# Patient Record
Sex: Male | Born: 1955 | Race: White | Hispanic: No | Smoking: Never smoker
Health system: Southern US, Community
[De-identification: ages and names within clinical notes are randomized; demographics above are authoritative.]

## PROBLEM LIST (undated history)

## (undated) DIAGNOSIS — I10 Essential (primary) hypertension: Secondary | ICD-10-CM

## (undated) DIAGNOSIS — M7989 Other specified soft tissue disorders: Secondary | ICD-10-CM

## (undated) DIAGNOSIS — R079 Chest pain, unspecified: Secondary | ICD-10-CM

## (undated) DIAGNOSIS — M2550 Pain in unspecified joint: Secondary | ICD-10-CM

## (undated) DIAGNOSIS — E559 Vitamin D deficiency, unspecified: Secondary | ICD-10-CM

## (undated) DIAGNOSIS — R519 Headache, unspecified: Secondary | ICD-10-CM

## (undated) DIAGNOSIS — T7840XA Allergy, unspecified, initial encounter: Secondary | ICD-10-CM

## (undated) DIAGNOSIS — H9319 Tinnitus, unspecified ear: Secondary | ICD-10-CM

## (undated) DIAGNOSIS — M1990 Unspecified osteoarthritis, unspecified site: Secondary | ICD-10-CM

## (undated) DIAGNOSIS — E663 Overweight: Secondary | ICD-10-CM

## (undated) DIAGNOSIS — R7303 Prediabetes: Secondary | ICD-10-CM

## (undated) DIAGNOSIS — I2510 Atherosclerotic heart disease of native coronary artery without angina pectoris: Secondary | ICD-10-CM

## (undated) DIAGNOSIS — E785 Hyperlipidemia, unspecified: Secondary | ICD-10-CM

## (undated) DIAGNOSIS — G709 Myoneural disorder, unspecified: Secondary | ICD-10-CM

## (undated) DIAGNOSIS — G4730 Sleep apnea, unspecified: Secondary | ICD-10-CM

## (undated) DIAGNOSIS — I499 Cardiac arrhythmia, unspecified: Secondary | ICD-10-CM

## (undated) DIAGNOSIS — R001 Bradycardia, unspecified: Secondary | ICD-10-CM

## (undated) DIAGNOSIS — K219 Gastro-esophageal reflux disease without esophagitis: Secondary | ICD-10-CM

## (undated) DIAGNOSIS — H269 Unspecified cataract: Secondary | ICD-10-CM

## (undated) DIAGNOSIS — G4733 Obstructive sleep apnea (adult) (pediatric): Secondary | ICD-10-CM

## (undated) DIAGNOSIS — N281 Cyst of kidney, acquired: Secondary | ICD-10-CM

## (undated) DIAGNOSIS — M549 Dorsalgia, unspecified: Secondary | ICD-10-CM

## (undated) DIAGNOSIS — T884XXA Failed or difficult intubation, initial encounter: Secondary | ICD-10-CM

## (undated) DIAGNOSIS — I4891 Unspecified atrial fibrillation: Secondary | ICD-10-CM

## (undated) HISTORY — PX: TOTAL KNEE ARTHROPLASTY: SHX125

## (undated) HISTORY — DX: Obstructive sleep apnea (adult) (pediatric): G47.33

## (undated) HISTORY — DX: Unspecified atrial fibrillation: I48.91

## (undated) HISTORY — DX: Atherosclerotic heart disease of native coronary artery without angina pectoris: I25.10

## (undated) HISTORY — DX: Essential (primary) hypertension: I10

## (undated) HISTORY — DX: Dorsalgia, unspecified: M54.9

## (undated) HISTORY — PX: OTHER SURGICAL HISTORY: SHX169

## (undated) HISTORY — DX: Unspecified cataract: H26.9

## (undated) HISTORY — DX: Vitamin D deficiency, unspecified: E55.9

## (undated) HISTORY — DX: Unspecified osteoarthritis, unspecified site: M19.90

## (undated) HISTORY — DX: Overweight: E66.3

## (undated) HISTORY — DX: Allergy, unspecified, initial encounter: T78.40XA

## (undated) HISTORY — DX: Prediabetes: R73.03

## (undated) HISTORY — DX: Other specified soft tissue disorders: M79.89

## (undated) HISTORY — DX: Pain in unspecified joint: M25.50

## (undated) HISTORY — PX: TONSILLECTOMY: SUR1361

## (undated) HISTORY — DX: Chest pain, unspecified: R07.9

## (undated) HISTORY — DX: Hyperlipidemia, unspecified: E78.5

## (undated) HISTORY — PX: KNEE ARTHROSCOPY: SUR90

---

## 2001-12-20 ENCOUNTER — Ambulatory Visit (HOSPITAL_COMMUNITY): Admission: RE | Admit: 2001-12-20 | Discharge: 2001-12-20 | Payer: Self-pay | Admitting: Neurology

## 2002-07-06 ENCOUNTER — Ambulatory Visit (HOSPITAL_COMMUNITY): Admission: RE | Admit: 2002-07-06 | Discharge: 2002-07-06 | Payer: Self-pay | Admitting: Pulmonary Disease

## 2003-05-12 HISTORY — PX: CARDIAC CATHETERIZATION: SHX172

## 2003-05-17 ENCOUNTER — Ambulatory Visit (HOSPITAL_COMMUNITY): Admission: RE | Admit: 2003-05-17 | Discharge: 2003-05-17 | Payer: Self-pay | Admitting: Pulmonary Disease

## 2003-08-07 ENCOUNTER — Inpatient Hospital Stay (HOSPITAL_BASED_OUTPATIENT_CLINIC_OR_DEPARTMENT_OTHER): Admission: RE | Admit: 2003-08-07 | Discharge: 2003-08-07 | Payer: Self-pay | Admitting: Cardiology

## 2004-03-28 ENCOUNTER — Encounter (HOSPITAL_COMMUNITY): Admission: RE | Admit: 2004-03-28 | Discharge: 2004-03-31 | Payer: Self-pay | Admitting: Pulmonary Disease

## 2004-04-08 ENCOUNTER — Ambulatory Visit: Payer: Self-pay | Admitting: Cardiology

## 2004-08-11 ENCOUNTER — Ambulatory Visit: Payer: Self-pay | Admitting: Pulmonary Disease

## 2004-08-12 ENCOUNTER — Ambulatory Visit: Payer: Self-pay | Admitting: *Deleted

## 2004-08-12 ENCOUNTER — Encounter (HOSPITAL_COMMUNITY): Admission: RE | Admit: 2004-08-12 | Discharge: 2004-08-12 | Payer: Self-pay | Admitting: *Deleted

## 2004-11-04 ENCOUNTER — Ambulatory Visit: Payer: Self-pay | Admitting: *Deleted

## 2006-04-23 ENCOUNTER — Emergency Department (HOSPITAL_COMMUNITY): Admission: EM | Admit: 2006-04-23 | Discharge: 2006-04-24 | Payer: Self-pay | Admitting: Emergency Medicine

## 2006-04-29 ENCOUNTER — Ambulatory Visit: Payer: Self-pay | Admitting: Gastroenterology

## 2006-05-11 HISTORY — PX: COLONOSCOPY: SHX174

## 2006-05-24 ENCOUNTER — Ambulatory Visit (HOSPITAL_COMMUNITY): Admission: RE | Admit: 2006-05-24 | Discharge: 2006-05-24 | Payer: Self-pay | Admitting: Gastroenterology

## 2006-05-24 ENCOUNTER — Encounter (INDEPENDENT_AMBULATORY_CARE_PROVIDER_SITE_OTHER): Payer: Self-pay | Admitting: *Deleted

## 2006-05-24 ENCOUNTER — Ambulatory Visit: Payer: Self-pay | Admitting: Gastroenterology

## 2006-11-03 ENCOUNTER — Ambulatory Visit: Payer: Self-pay | Admitting: Cardiovascular Disease

## 2006-11-04 ENCOUNTER — Encounter: Payer: Self-pay | Admitting: Cardiovascular Disease

## 2006-11-04 ENCOUNTER — Ambulatory Visit: Payer: Self-pay | Admitting: Internal Medicine

## 2006-11-04 ENCOUNTER — Ambulatory Visit (HOSPITAL_COMMUNITY): Admission: RE | Admit: 2006-11-04 | Discharge: 2006-11-04 | Payer: Self-pay | Admitting: Cardiovascular Disease

## 2007-03-11 ENCOUNTER — Ambulatory Visit (HOSPITAL_BASED_OUTPATIENT_CLINIC_OR_DEPARTMENT_OTHER): Admission: RE | Admit: 2007-03-11 | Discharge: 2007-03-11 | Payer: Self-pay | Admitting: Specialist

## 2008-06-18 ENCOUNTER — Ambulatory Visit: Payer: Self-pay | Admitting: Cardiology

## 2008-06-26 ENCOUNTER — Encounter (HOSPITAL_COMMUNITY): Admission: RE | Admit: 2008-06-26 | Discharge: 2008-07-26 | Payer: Self-pay | Admitting: Cardiology

## 2008-06-26 ENCOUNTER — Ambulatory Visit: Payer: Self-pay | Admitting: Cardiology

## 2008-07-05 ENCOUNTER — Ambulatory Visit: Payer: Self-pay | Admitting: Cardiology

## 2008-08-10 ENCOUNTER — Ambulatory Visit: Payer: Self-pay | Admitting: Cardiology

## 2008-08-30 ENCOUNTER — Ambulatory Visit: Payer: Self-pay | Admitting: Cardiology

## 2008-11-09 DIAGNOSIS — I10 Essential (primary) hypertension: Secondary | ICD-10-CM | POA: Insufficient documentation

## 2009-01-22 ENCOUNTER — Encounter: Payer: Self-pay | Admitting: Cardiology

## 2009-01-25 ENCOUNTER — Telehealth (INDEPENDENT_AMBULATORY_CARE_PROVIDER_SITE_OTHER): Payer: Self-pay

## 2009-03-26 ENCOUNTER — Inpatient Hospital Stay (HOSPITAL_COMMUNITY): Admission: RE | Admit: 2009-03-26 | Discharge: 2009-03-29 | Payer: Self-pay | Admitting: Specialist

## 2009-04-22 ENCOUNTER — Encounter: Admission: RE | Admit: 2009-04-22 | Discharge: 2009-04-22 | Payer: Self-pay | Admitting: Specialist

## 2009-05-29 ENCOUNTER — Ambulatory Visit (HOSPITAL_BASED_OUTPATIENT_CLINIC_OR_DEPARTMENT_OTHER): Admission: RE | Admit: 2009-05-29 | Discharge: 2009-05-29 | Payer: Self-pay | Admitting: Specialist

## 2009-08-21 DIAGNOSIS — I2510 Atherosclerotic heart disease of native coronary artery without angina pectoris: Secondary | ICD-10-CM | POA: Insufficient documentation

## 2009-08-21 DIAGNOSIS — I498 Other specified cardiac arrhythmias: Secondary | ICD-10-CM

## 2009-08-26 ENCOUNTER — Ambulatory Visit: Payer: Self-pay | Admitting: Cardiology

## 2009-09-03 ENCOUNTER — Ambulatory Visit: Payer: Self-pay | Admitting: Cardiology

## 2009-09-03 ENCOUNTER — Encounter (HOSPITAL_COMMUNITY): Admission: RE | Admit: 2009-09-03 | Discharge: 2009-10-03 | Payer: Self-pay | Admitting: Cardiology

## 2009-09-09 ENCOUNTER — Encounter: Payer: Self-pay | Admitting: Cardiology

## 2009-09-10 ENCOUNTER — Ambulatory Visit (HOSPITAL_COMMUNITY): Admission: RE | Admit: 2009-09-10 | Discharge: 2009-09-10 | Payer: Self-pay | Admitting: Pulmonary Disease

## 2009-09-10 ENCOUNTER — Encounter: Payer: Self-pay | Admitting: Cardiology

## 2009-09-10 ENCOUNTER — Ambulatory Visit (HOSPITAL_COMMUNITY): Admission: RE | Admit: 2009-09-10 | Discharge: 2009-09-10 | Payer: Self-pay | Admitting: Cardiology

## 2009-09-10 LAB — CONVERTED CEMR LAB
BUN: 12 mg/dL (ref 6–23)
CO2: 25 meq/L (ref 19–32)
Calcium: 9.2 mg/dL (ref 8.4–10.5)
Chloride: 103 meq/L (ref 96–112)
Creatinine, Ser: 1.08 mg/dL (ref 0.40–1.50)
Glucose, Bld: 82 mg/dL (ref 70–99)
Potassium: 4.2 meq/L (ref 3.5–5.3)
Sodium: 138 meq/L (ref 135–145)

## 2009-09-16 ENCOUNTER — Ambulatory Visit (HOSPITAL_COMMUNITY): Admission: RE | Admit: 2009-09-16 | Discharge: 2009-09-16 | Payer: Self-pay | Admitting: Pulmonary Disease

## 2009-10-06 ENCOUNTER — Encounter: Payer: Self-pay | Admitting: Physician Assistant

## 2009-10-07 ENCOUNTER — Encounter: Payer: Self-pay | Admitting: Physician Assistant

## 2009-10-07 ENCOUNTER — Ambulatory Visit: Payer: Self-pay | Admitting: Cardiology

## 2009-10-08 ENCOUNTER — Telehealth (INDEPENDENT_AMBULATORY_CARE_PROVIDER_SITE_OTHER): Payer: Self-pay | Admitting: *Deleted

## 2009-10-11 ENCOUNTER — Ambulatory Visit: Payer: Self-pay | Admitting: Physician Assistant

## 2009-10-11 ENCOUNTER — Encounter (INDEPENDENT_AMBULATORY_CARE_PROVIDER_SITE_OTHER): Payer: Self-pay | Admitting: *Deleted

## 2009-10-11 DIAGNOSIS — I4891 Unspecified atrial fibrillation: Secondary | ICD-10-CM

## 2009-10-11 DIAGNOSIS — E782 Mixed hyperlipidemia: Secondary | ICD-10-CM | POA: Insufficient documentation

## 2009-10-11 DIAGNOSIS — G4733 Obstructive sleep apnea (adult) (pediatric): Secondary | ICD-10-CM | POA: Insufficient documentation

## 2009-10-18 ENCOUNTER — Ambulatory Visit: Payer: Self-pay | Admitting: Cardiology

## 2009-10-18 ENCOUNTER — Encounter: Payer: Self-pay | Admitting: Physician Assistant

## 2009-10-25 ENCOUNTER — Encounter: Payer: Self-pay | Admitting: Physician Assistant

## 2009-10-25 ENCOUNTER — Ambulatory Visit: Payer: Self-pay | Admitting: Cardiology

## 2009-10-30 ENCOUNTER — Encounter (INDEPENDENT_AMBULATORY_CARE_PROVIDER_SITE_OTHER): Payer: Self-pay | Admitting: *Deleted

## 2009-11-06 ENCOUNTER — Encounter (INDEPENDENT_AMBULATORY_CARE_PROVIDER_SITE_OTHER): Payer: Self-pay | Admitting: *Deleted

## 2009-11-15 ENCOUNTER — Ambulatory Visit: Payer: Self-pay | Admitting: Cardiology

## 2010-01-27 ENCOUNTER — Ambulatory Visit: Payer: Self-pay | Admitting: Cardiology

## 2010-01-27 DIAGNOSIS — R079 Chest pain, unspecified: Secondary | ICD-10-CM | POA: Insufficient documentation

## 2010-02-27 ENCOUNTER — Ambulatory Visit: Payer: Self-pay | Admitting: Cardiology

## 2010-03-05 ENCOUNTER — Ambulatory Visit: Payer: Self-pay | Admitting: Cardiology

## 2010-03-10 ENCOUNTER — Encounter: Payer: Self-pay | Admitting: Cardiology

## 2010-05-15 ENCOUNTER — Ambulatory Visit: Admit: 2010-05-15 | Payer: Self-pay | Admitting: Cardiology

## 2010-06-01 ENCOUNTER — Encounter: Payer: Self-pay | Admitting: Cardiology

## 2010-06-12 NOTE — Assessment & Plan Note (Signed)
Summary: f60m  --agh   Visit Type:  Follow-up Primary Provider:  Dr. Kari Baars   History of Present Illness: 55 year old male presents for folow-up. I saw him back in September. He reports no problems with chest pain. Does experience brief palpitations, almost daily. Symptoms are worse with exertion. He has not had any prolonged rapid palpitations however. No dizziness or syncope.  Followup ECG is reviewed below. He reports compliance with his medications.  He states that he has found a new job with fewer  physical exertion demands, still driving.  He has been riding his bicycle some.   Preventive Screening-Counseling & Management  Alcohol-Tobacco     Smoking Status: never  Current Medications (verified): 1)  Ramipril 10 Mg Caps (Ramipril) .[name].. 1 Cap Once Daily 2)  Vytorin 10-40 Mg Tabs (Ezetimibe-Simvastatin) .[name].. 1 Tab At Bedtime 3)  Aspirin Ec 325 Mg Tbec (Aspirin) .... Take One Tablet By Mouth Daily 4)  Fish Oil 1000 Mg Caps (Omega-3 Fatty Acids) .[name].. 1 Cap Once Daily 5)  Co Q-10 100 Mg Caps (Coenzyme Q10) .[name].. 1 Cap Once Daily 6)  B Complex  Tabs (B Complex Vitamins) .[name].. 1 Tab Once Daily 7)  Nitrostat 0.4 Mg Subl (Nitroglycerin) .[name].. 1 Tablet Under Tongue At Onset of Chest Pain; You May Repeat Every 5 Minutes For Up To 3 Doses. 8)  Flecainide Acetate 50 Mg Tabs (Flecainide Acetate) .... Take 1 Tablet By Mouth Two Times A Day  Allergies (verified): 1)  ! Pcn 2)  ! * Mycins  Comments:  Nurse/Medical Assistant: The patient's medications and allergies were verbally reviewed with the patient and were updated in the Medication and Allergy Lists.  Past History:  Past Medical History: Last updated: 11/15/2009 OSA Atrial Fibrillation - paroxysmal CAD - nonobstructive, LVEF normal Hypertension  Social History: Last updated: 11/15/2009 Full Time Single  Tobacco Use - No Alcohol Use - no  Review of Systems  The patient denies anorexia, fever, chest pain,  syncope, dyspnea on exertion, peripheral edema, prolonged cough, abdominal pain, melena, hematochezia, and severe indigestion/heartburn.         Otherwise reviewed and negative.  Vital Signs:  Patient profile:   55 year old male Height:      74 inches Weight:      253 pounds Pulse rate:   64 / minute BP sitting:   126 / 73  (left arm) Cuff size:   large  Vitals Entered By: [name] (February 27, 2010 1:03 PM)  Physical Exam  Additional Exam:  Overweight male in no acute distress. HEENT: Conjunctiva and lids normal, oral pharynx clear. Neck: Supple, no carotid bruits or elevated JVP. Lungs: Clear to auscultation, nonlabored. Cardiac: Regular rate and rhythm, no S3 gallop. Abdomen: Soft, nontender, bowel sounds present. Extremities: No pitting edema, distal pulses full. Skin: Warm and dry. Musculoskeletal: No kyphosis. Neuropsychiatric: Alert and oriented x3, affect appropriate.   EKG  Procedure date:  02/27/2010  Findings:      Normal sinus rhythm at 63 beats per minute with QTC 430 ms.  Prior Report Reviewed for Nuclear Study:  Findings: 09/03/2009 IMPRESSION:   Overall low risk exercise Myoview as outlined.  There were no   diagnostic ST-segment changes by standard criteria at a maximum   workload of 10.4 mets.  Dyspnea on exertion was noted, however no   chest pain was described.  There was a significant hypertensive   response to exercise.  Perfusion data show evidence of   diaphragmatic  and chest wall soft tissue attenuation, without frank   ischemia.  LVEF normal 58%.  Prior Report Reviewed for Exercise Stress Test:  Findings: 10/25/2009 Nondiagnostic stress test, 10.1 METS achieved, no arrhythmia on flecainide.  Comments:    Impression & Recommendations:  Problem # 1:  PAROXYSMAL ATRIAL FIBRILLATION (ICD-427.31)  He does complain of brief palpitationst, none prolonged. Not entirely clear whether this represents breakthrough atrial  fibrillation, or ectopy. 48 hour Holter monitor will be placed prior to further advancing flecainide. Otherwise anticipate followup in 3 months.  His updated medication list for this problem includes:    Aspirin Ec 325 Mg Tbec (Aspirin) .[name]... Take one tablet by mouth daily    Flecainide Acetate 50 Mg Tabs (Flecainide acetate) .[name]... Take 1 tablet by mouth two times a day  Orders: EKG w/ Interpretation (93000)  Problem # 2:  CHEST PAIN UNSPECIFIED (ICD-786.50)  Resolved. Ischemic workup reassuring this year. Continue observation.  His updated medication list for this problem includes:    Ramipril 10 Mg Caps (Ramipril) .[name]... 1 cap once daily    Aspirin Ec 325 Mg Tbec (Aspirin) .[name]... Take one tablet by mouth daily    Nitrostat 0.4 Mg Subl (Nitroglycerin) .[name]... 1 tablet under tongue at onset of chest pain; you may repeat every 5 minutes for up to 3 doses.  Problem # 3:  HYPERLIPIDEMIA, MIXED (ICD-272.2)  Followed by Dr. Juanetta Gosling.  His updated medication list for this problem includes:    Vytorin 10-40 Mg Tabs (Ezetimibe-simvastatin) .[name]... 1 tab at bedtime  Other Orders: Holter Monitor (Holter Monitor)  Patient Instructions: 1)  Return on Wed, October 26th at [facility] for monitor placement.  2)  Your physician has recommended that you wear a holter monitor.  Holter monitors are medical devices that record the heart's electrical activity. Doctors most often use these monitors to diagnose arrhythmias. Arrhythmias are problems with the speed or rhythm of the heartbeat. The monitor is a small, portable device. You can wear one while you do your normal daily activities. This is usually used to diagnose what is causing palpitations/syncope (passing out). 3)  Your physician wants you to follow-up in: 3 months. You will receive a reminder letter in the mail one-two months in advance. If you don't receive a letter, please call our office to schedule the follow-up appointment. 4)  Your physician recommends  that you continue on your current medications as directed. Please refer to the Current Medication list given to you today.

## 2010-06-12 NOTE — Letter (Signed)
Summary: Graded Exercise Tolerance Test  [facility] [address]   [place], [place] [address]   Phone: [phone]  Fax: [phone]      [facility] Cardiovascular Services  Graded Exercise Tolerance Test    Lovena Le  Appointment Date:_  Appointment Time:_   Your doctor has ordered a stress test to help determine the condition of your  heart during exercise. If you take blood pressure medicine , ask your doctor if you should take it the day of your test. You may eat a light meal before your test.  Please be sure to bring the copy of your order with you.   You should dress comfortably, for example: Sweat pants, shorts, or skirt, Loose                                                                                                       short sleeved T-shirt, Rubber soled lace-up shoes (tennis shoes)  You will need to arrive 15 minutes before your appointment time. You will also need to enter at the Main Entrance of the hospital and go to the registration desk. They will direct you to the Cardiovascular Department on the third floor.  You will need to plan on being at the hospital for one hour from registration for this appointment.  You may take all of your medications with water the morning of your test.

## 2010-06-12 NOTE — Assessment & Plan Note (Signed)
Summary: rov. gd   Visit Type:  1 yr f/u Primary Provider:  Shaune Pollack  CC:  pt states he is on medical leave and insurance is running out the end of April and pt needs to see if he is able to work.....chest discomfort...pt states he feels like he is not getting enoug O2 even though he is breathing normal he states....  History of Present Illness: Jonathan Dyer comes in today for chest tightness and inability get a good breath of air when exercising.  He is status post left total knee surgery several months ago. He is gaining some weight because of inactivity. When he exercises, he develops the above sensation. He has a history of nonobstructive coronary disease. He commercially drives a Paediatric nurse. He needs to go back to work and of April  His discomfort does not radiate. It is not associated with the nausea or diaphoresis. He does not have it at rest.  Current Medications (verified): 1)  Ramipril 10 Mg Caps (Ramipril) .[name].. 1 Cap Once Daily 2)  Vytorin 10-40 Mg Tabs (Ezetimibe-Simvastatin) .[name].. 1 Tab At Bedtime 3)  Aspirin Ec 325 Mg Tbec (Aspirin) .... Take One Tablet By Mouth Daily 4)  Fish Oil 1000 Mg Caps (Omega-3 Fatty Acids) .[name].. 1 Cap Once Daily 5)  Slow-Mag 71.5-119 Mg Tbec (Magnesium Cl-Calcium Carbonate) .... As Needed 6)  Carvedilol 12.5 Mg Tabs (Carvedilol) .[name].. 1 Tab Two Times A Day 7)  Co Q-10 100 Mg Caps (Coenzyme Q10) .[name].. 1 Cap Once Daily 8)  B Complex  Tabs (B Complex Vitamins) .[name].. 1 Tab Once Daily 9)  Nitrostat 0.4 Mg Subl (Nitroglycerin) .[name].. 1 Tablet Under Tongue At Onset of Chest Pain; You May Repeat Every 5 Minutes For Up To 3 Doses.  Allergies (verified): 1)  ! Pcn 2)  ! * Mycins  Past History:  Past Medical History: Last updated: 08/21/2009 CAD, NATIVE VESSEL (ICD-414.01) BRADYCARDIA (ICD-427.89) HYPERTENSION (ICD-401.9) DIZZINESS (ICD-780.4)  Past Surgical History: Last updated: 08/21/2009 Tonsillectomy left anterior cruciate ligament  reconstruction right shoulder surgery in October 2008  Knee Arthroscopy Knee Arthroplasty-Total  Family History: Last updated: 08/21/2009 Significant for diabetes mellitus and the patient's   mother who died of a pulmonary embolus.  He does have a brother with a  history of hypertension and hyperlipidemia.  His father died of  pulmonary complications.   Social History: Last updated: 08/21/2009 The patient is single.  He has 3 children.  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  No active tobacco or  alcohol use.  He has not been exercising as regularly over the last  several months to a year.   Vital Signs:  Patient profile:   55 year old male Height:      74 inches Weight:      258 pounds BMI:     33.24 Pulse rate:   46 / minute Pulse rhythm:   irregular BP sitting:   126 / 80  (left arm) Cuff size:   large  Vitals Entered By: Danielle Rankin, CMA (August 26, 2009 11:27 AM)  Physical Exam  General:  overweight, no acute distress Head:  normocephalic and atraumatic Eyes:  PERRLA/EOM intact; conjunctiva and lids normal. Neck:  Neck supple, no JVD. No masses, thyromegaly or abnormal cervical nodes. Chest Javyn Havlin:  no deformities or breast masses noted Lungs:  Clear bilaterally to auscultation and percussion. Heart:  Non-displaced PMI, chest non-tender; regular rate and rhythm, S1, S2 without murmurs, rubs or gallops.  Carotid upstroke normal, no bruit. Normal abdominal aortic size, no bruits. Femorals normal pulses, no bruits. Pedals normal pulses. No edema, no varicosities. Msk:  Back normal, normal gait. Muscle strength and tone normal. Pulses:  pulses normal in all 4 extremities Extremities:  No clubbing or cyanosis. Neurologic:  Alert and oriented x 3. Skin:  Intact without lesions or rashes. Psych:  Normal affect.   Problems:  Medical Problems Added: 1)  Dx of Chest Tightness-pressure-other  ([id number]) 2)  Dx of Chest Tightness-pressure-other   ([id number])  Impression & Recommendations:  Problem # 1:  CHEST TIGHTNESS-PRESSURE-OTHER ([id number]) Assessment New I am concerned about an ischemic equivalent. 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 Before I cleared him for work, I will obtain a stress Myoview. Orders: Nuclear Stress Test (Nuc Stress Test)  Problem # 2:  CAD, NATIVE VESSEL (ICD-414.01)  His updated medication list for this problem includes:    Ramipril 10 Mg Caps (Ramipril) .[name]... 1 cap once daily    Aspirin Ec 325 Mg Tbec (Aspirin) .[name]... Take one tablet by mouth daily    Carvedilol 12.5 Mg Tabs (Carvedilol) .[name]... 1 tab two times a day    Nitrostat 0.4 Mg Subl (Nitroglycerin) .[name]... 1 tablet under tongue at onset of chest pain; you may repeat every 5 minutes for up to 3 doses.  His updated medication list for this problem includes:    Ramipril 10 Mg Caps (Ramipril) .[name]... 1 cap once daily    Aspirin Ec 325 Mg Tbec (Aspirin) .[name]... Take one tablet by mouth daily    Carvedilol 12.5 Mg Tabs (Carvedilol) .[name]... 1 tab two times a day    Nitrostat 0.4 Mg Subl (Nitroglycerin) .[name]... 1 tablet under tongue at onset of chest pain; you may repeat every 5 minutes for up to 3 doses.  Orders: EKG w/ Interpretation (93000)  Problem # 3:  BRADYCARDIA (ICD-427.89) Assessment: Unchanged  His updated medication list for this problem includes:    Ramipril 10 Mg Caps (Ramipril) .[name]... 1 cap once daily    Aspirin Ec 325 Mg Tbec (Aspirin) .[name]... Take one tablet by mouth daily    Carvedilol 12.5 Mg Tabs (Carvedilol) .[name]... 1 tab two times a day    Nitrostat 0.4 Mg Subl (Nitroglycerin) .[name]... 1 tablet under tongue at onset of chest pain; you may repeat every 5 minutes for up to 3 doses.  His updated medication list for this problem includes:    Ramipril 10 Mg Caps (Ramipril) .[name]... 1 cap once daily    Aspirin Ec 325 Mg Tbec (Aspirin) .[name]... Take one tablet by mouth daily    Carvedilol 12.5 Mg Tabs (Carvedilol) .[name]... 1 tab two times  a day    Nitrostat 0.4 Mg Subl (Nitroglycerin) .[name]... 1 tablet under tongue at onset of chest pain; you may repeat every 5 minutes for up to 3 doses.  Patient Instructions: 1)  Your physician recommends that you schedule a follow-up appointment in: 12 months 2)  Your physician recommends that you continue on your current medications as directed. Please refer to the Current Medication list given to you today. 3)  Your physician has requested that you have an adenosine myoview.  For further information please visit [email or web address]  Please follow instruction sheet, as given.

## 2010-06-12 NOTE — Assessment & Plan Note (Signed)
Summary: ROV-CHEST TIGHTNESS WHILE RIDING   Visit Type:  Follow-up Primary Provider:  Dr. Kari Baars   History of Present Illness: 55 year old male presents for followup. He is referred back to the office by Dr. Juanetta Gosling given recent symptoms. Jonathan Dyer states that he has been trying to cycle some. On one particular occasion when he was cycling harder than usual with a friend, he noticed a "fluttering" in his chest followed by a "tightness" with subsequent fatigue. For the next few days he reported increased napping and weakness. No recurrent tightness however. Gradually he has felt better.  He's had no prolonged palpitations, dizziness, or syncope. He reports compliance with medications.  I reviewed with him the results of his ischemic evaluation over the last several months including a Myoview from April, and more recently a standard treadmill test on flecainide which excluded pro- arrhythmia in June.  He has a history of nonobstructive CAD based on angiogram in the past. We discussed the possibility of proceeding with a repeat diagnostic angiogram based on his recurrent symptoms, to exclude progressive disease that perhaps did not show up on noninvasive testing. At this point he was most comfortable with observation, since he was feeling better, and return to the office over the next month.  Current Medications (verified): 1)  Ramipril 10 Mg Caps (Ramipril) .[name].. 1 Cap Once Daily 2)  Vytorin 10-40 Mg Tabs (Ezetimibe-Simvastatin) .[name].. 1 Tab At Bedtime 3)  Aspirin Ec 325 Mg Tbec (Aspirin) .... Take One Tablet By Mouth Daily 4)  Fish Oil 1000 Mg Caps (Omega-3 Fatty Acids) .[name].. 1 Cap Once Daily 5)  Slow-Mag 71.5-119 Mg Tbec (Magnesium Cl-Calcium Carbonate) .... As Needed 6)  Co Q-10 100 Mg Caps (Coenzyme Q10) .[name].. 1 Cap Once Daily 7)  B Complex  Tabs (B Complex Vitamins) .[name].. 1 Tab Once Daily 8)  Nitrostat 0.4 Mg Subl (Nitroglycerin) .[name].. 1 Tablet Under Tongue At Onset of Chest Pain; You  May Repeat Every 5 Minutes For Up To 3 Doses. 9)  Flecainide Acetate 50 Mg Tabs (Flecainide Acetate) .... Take 1 Tablet By Mouth Two Times A Day  Allergies (verified): 1)  ! Pcn 2)  ! * Mycins  Comments:  Nurse/Medical Assistant: The patient's medications and allergies were verbally  reviewed with the patient and were updated in the Medication and Allergy Lists.  Past History:  Past Medical History: Last updated: 11/15/2009 OSA Atrial Fibrillation - paroxysmal CAD - nonobstructive, LVEF normal Hypertension  Past Surgical History: Last updated: 11/15/2009 Tonsillectomy Left anterior cruciate ligament reconstruction Right shoulder surgery in October 2008  Knee Arthroscopy Knee Arthroplasty-Total  Social History: Last updated: 11/15/2009 Full Time Single  Tobacco Use - No Alcohol Use - no   Review of Systems       The patient complains of chest pain and dyspnea on exertion.  The patient denies anorexia, fever, syncope, peripheral edema, melena, and hematochezia.         Otherwise reviewed and negative.  Vital Signs:  Patient profile:   55 year old male Height:      74 inches Weight:      253 pounds Pulse rate:   59 / minute BP sitting:   138 / 90  (left arm) Cuff size:   large  Vitals Entered By: [name] (January 27, 2010 2:58 PM)  Physical Exam  Additional Exam:  GEN:55 year old male, sitting upright, in no distress HEENT: NCAT,PERRLA,EOMI NECK: palpable pulses, no bruits; no JVD; no TM LUNGS: CTA bilaterally HEART: RRR (S1S2);  no significant murmurs; no rubs; no gallops ABD: soft, NT; intact BS EXT: intact distal pulses; no edema SKIN: warm, dry MUSC: no obvious deformity NEURO: A/O (x3)     EKG  Procedure date:  01/27/2010  Findings:      Sinus bradycardia at 52 beats per minute, QTC 392 ms. QRS duration 116 ms.  Prior Report Reviewed for Nuclear Study:  Findings: 09/03/2009 IMPRESSION:   Overall low risk exercise Myoview as  outlined.  There were no   diagnostic ST-segment changes by standard criteria at a maximum   workload of 10.4 mets.  Dyspnea on exertion was noted, however no   chest pain was described.  There was a significant hypertensive   response to exercise.  Perfusion data show evidence of   diaphragmatic and chest wall soft tissue attenuation, without frank   ischemia.  LVEF normal 58%.  Prior Report Reviewed for Exercise Stress Test:  Findings: 10/25/2009 Nondiagnostic stress test, 10.1 METS achieved, no arrhythmia on flecainide.  Comments:    Impression & Recommendations:  Problem # 1:  CHEST PAIN UNSPECIFIED (ICD-786.50)  Etiology not clear at this point. Possibly angina, although noninvasive testing has been reassuring over the last several months. Could also be potentially a sign of recurrent atrial fibrillation. I Reviewed his electrocardiogram today. We discussed further testing as detailed above. At this point he is most comfortable of observation of symptoms, and followup over the next month. I suggested that if he continues to have recurrent episodes of chest "tightness," particularly with exertion, we may need to proceed with a followup cardiac catheterization to best clarify his coronary anatomy. This will be particularly important if flecainide is to be continued long-term. He will followup in one month.  His updated medication list for this problem includes:    Ramipril 10 Mg Caps (Ramipril) .[name]... 1 cap once daily    Aspirin Ec 325 Mg Tbec (Aspirin) .[name]... Take one tablet by mouth daily    Nitrostat 0.4 Mg Subl (Nitroglycerin) .[name]... 1 tablet under tongue at onset of chest pain; you may repeat every 5 minutes for up to 3 doses.  Problem # 2:  PAROXYSMAL ATRIAL FIBRILLATION (ICD-427.31)  In sinus rhythm today.  His updated medication list for this problem includes:    Aspirin Ec 325 Mg Tbec (Aspirin) .[name]... Take one tablet by mouth daily    Flecainide Acetate 50 Mg Tabs  (Flecainide acetate) .[name]... Take 1 tablet by mouth two times a day  Problem # 3:  CAD, NATIVE VESSEL (ICD-414.01)  Nonobstructive at catheterization 2005.  His updated medication list for this problem includes:    Ramipril 10 Mg Caps (Ramipril) .[name]... 1 cap once daily    Aspirin Ec 325 Mg Tbec (Aspirin) .[name]... Take one tablet by mouth daily    Nitrostat 0.4 Mg Subl (Nitroglycerin) .[name]... 1 tablet under tongue at onset of chest pain; you may repeat every 5 minutes for up to 3 doses.  Patient Instructions: 1)  Your physician recommends that you continue on your current medications as directed. Please refer to the Current Medication list given to you today. 2)  Follow up in  1 month

## 2010-06-12 NOTE — Consult Note (Signed)
Summary: CARDIOLOGY CONSULT/ [facility]  CARDIOLOGY CONSULT/ [facility]   Imported By: Zachary George 10/11/2009 12:05:53  _____________________________________________________________________  External Attachment:    Type:   Image     Comment:   External Document

## 2010-06-12 NOTE — Assessment & Plan Note (Signed)
Summary: 1 MO F/U PER 6/3 OV-JM   Visit Type:  Follow-up Primary Provider:  Dr. Kari Baars   History of Present Illness: 55 year old male presents for followup. He is doing well, tolerating flecainide, and has not had any major breakthrough arrhythmias. He reports occasional palpitations only. He has returned to riding his bicycle, trying to gradually increase his stamina. He still has some daytime somnolence, and I reminded him about being very compliant with his CPAP for sleep apnea.  He reports having a DOT physical soon, and we can provide records if requested. Is not yet returned to work.  Followup treadmill test on flecainide is reviewed below, without evidence of proarrhythmia. Also reassuring was a myocardial perfusion study ordered by Dr. Daleen Squibb back in April, showing no focal ischemia with LVEF 58%.   Preventive Screening-Counseling & Management  Alcohol-Tobacco     Smoking Status: never  Current Medications (verified): 1)  Ramipril 10 Mg Caps (Ramipril) .[name].. 1 Cap Once Daily 2)  Vytorin 10-40 Mg Tabs (Ezetimibe-Simvastatin) .[name].. 1 Tab At Bedtime 3)  Aspirin Ec 325 Mg Tbec (Aspirin) .... Take One Tablet By Mouth Daily 4)  Fish Oil 1000 Mg Caps (Omega-3 Fatty Acids) .[name].. 1 Cap Once Daily 5)  Slow-Mag 71.5-119 Mg Tbec (Magnesium Cl-Calcium Carbonate) .... As Needed 6)  Co Q-10 100 Mg Caps (Coenzyme Q10) .[name].. 1 Cap Once Daily 7)  B Complex  Tabs (B Complex Vitamins) .[name].. 1 Tab Once Daily 8)  Nitrostat 0.4 Mg Subl (Nitroglycerin) .[name].. 1 Tablet Under Tongue At Onset of Chest Pain; You May Repeat Every 5 Minutes For Up To 3 Doses. 9)  Flecainide Acetate 50 Mg Tabs (Flecainide Acetate) .... Take 1 Tablet By Mouth Two Times A Day  Allergies (verified): 1)  ! Pcn 2)  ! * Mycins  Comments:  Nurse/Medical Assistant: The patient's medication bottles and allergies were reviewed with the patient and were updated in the Medication and Allergy Lists.  Past History:  Social  History: Last updated: 11/15/2009 Full Time Single  Tobacco Use - No Alcohol Use - no  Past Medical History: OSA Atrial Fibrillation - paroxysmal CAD - nonobstructive, LVEF normal Hypertension  Past Surgical History: Tonsillectomy Left anterior cruciate ligament reconstruction Right shoulder surgery in October 2008  Knee Arthroscopy Knee Arthroplasty-Total  Social History: Full Time Single  Tobacco Use - No Alcohol Use - no  Review of Systems       The patient complains of dyspnea on exertion.  The patient denies anorexia, fever, chest pain, syncope, peripheral edema, prolonged cough, melena, hematochezia, and severe indigestion/heartburn.         Otherwise reviewed and negative.  Vital Signs:  Patient profile:   55 year old male Height:      74 inches Weight:      264 pounds Pulse rate:   64 / minute BP sitting:   135 / 87  (left arm) Cuff size:   large  Vitals Entered By: [name] (November 15, 2009 1:31 PM)  Physical Exam  Additional Exam:  GEN:55 year old male, sitting upright, in no distress HEENT: NCAT,PERRLA,EOMI NECK: palpable pulses, no bruits; no JVD; no TM LUNGS: CTA bilaterally HEART: RRR (S1S2); no significant murmurs; no rubs; no gallops ABD: soft, NT; intact BS EXT: intact distal pulses; no edema SKIN: warm, dry MUSC: no obvious deformity NEURO: A/O (x3)     Exercise Stress Test  Procedure date:  10/25/2009  Findings:      Nondiagnostic stress test, 10.1 METS achieved,  no arrhythmia on flecainide.  Nuclear Study  Procedure date:  09/03/2009  Findings:      IMPRESSION:   Overall low risk exercise Myoview as outlined.  There were no   diagnostic ST-segment changes by standard criteria at a maximum   workload of 10.4 mets.  Dyspnea on exertion was noted, however no   chest pain was described.  There was a significant hypertensive   response to exercise.  Perfusion data show evidence of   diaphragmatic and chest wall soft tissue  attenuation, without frank   ischemia.  LVEF normal 58%.  Impression & Recommendations:  Problem # 1:  PAROXYSMAL ATRIAL FIBRILLATION (ICD-427.31)  Well-controlled based on symptom review, on flecainide. He continues on aspirin as well. No changes anticipated at this point. Plan to see him back over the next 4 months, sooner if needed. He is on a fairly low dose of flecainide, and we could up titrate if needed.  The following medications were removed from the medication list:    Flecainide Acetate 50 Mg Tabs (Flecainide acetate) .[name]... Take 1 tablet by mouth two times a day His updated medication list for this problem includes:    Aspirin Ec 325 Mg Tbec (Aspirin) .[name]... Take one tablet by mouth daily    Flecainide Acetate 50 Mg Tabs (Flecainide acetate) .[name]... Take 1 tablet by mouth two times a day  Problem # 2:  HYPERLIPIDEMIA, MIXED (ICD-272.2)  Continues on statin therapy, followed by Dr. Juanetta Gosling.  His updated medication list for this problem includes:    Vytorin 10-40 Mg Tabs (Ezetimibe-simvastatin) .[name]... 1 tab at bedtime  Problem # 3:  CAD, NATIVE VESSEL (ICD-414.01)  Previously documented nonobstructive CAD with recent myocardial perfusion study showing no evidence of ischemia with normal LVEF.  His updated medication list for this problem includes:    Ramipril 10 Mg Caps (Ramipril) .[name]... 1 cap once daily    Aspirin Ec 325 Mg Tbec (Aspirin) .[name]... Take one tablet by mouth daily    Nitrostat 0.4 Mg Subl (Nitroglycerin) .[name]... 1 tablet under tongue at onset of chest pain; you may repeat every 5 minutes for up to 3 doses.  Problem # 4:  OBSTRUCTIVE SLEEP APNEA (ICD-327.23)  I reminded the patient to be very compliant with his CPAP, as this will likely positively impact both his arrhythmia and also his feelings of daytime somnolence.  Patient Instructions: 1)  Your physician wants you to follow-up in: 4 months. You will receive a reminder letter in the mail one-two months in advance.  If you don't receive a letter, please call our office to schedule the follow-up appointment. 2)  Your physician recommends that you continue on your current medications as directed. Please refer to the Current Medication list given to you today.

## 2010-06-12 NOTE — Assessment & Plan Note (Signed)
Summary: POST [facility]-PT WILL FOLLOW IN [facility]-JM   Visit Type:  hospital follow-up Primary Provider:  Shaune Pollack   History of Present Illness: 55 year old male presents for post hospital followup.  Patient recently seen here in the ED for evaluation of newly documented PAF. He has history of nonobstructive CAD, normal LV function, and long-standing history of palpitations with negative event monitor in the past.  Dr. Andee Lineman recommended treating him with a one-time dose of flecainide 300 mg, which successfully converted him to normal sinus rhythm. He was taken off carvedilol, and discharged on Cardizem 240 mg daily. He is also to start flecainide 50 mg b.i.d. He has a [place] score of one, and aspirin was increased to 325 daily.  Clinically, he noted some palpitations earlier today, but nothing sustained. He denies any presyncope/syncope. He has eliminated caffeinated beverages from his diet. His TSH level was normal.  Preventive Screening-Counseling & Management  Alcohol-Tobacco     Smoking Status: never  Current Medications (verified): 1)  Ramipril 10 Mg Caps (Ramipril) .[name].. 1 Cap Once Daily 2)  Vytorin 10-40 Mg Tabs (Ezetimibe-Simvastatin) .[name].. 1 Tab At Bedtime 3)  Aspirin Ec 325 Mg Tbec (Aspirin) .... Take One Tablet By Mouth Daily 4)  Fish Oil 1000 Mg Caps (Omega-3 Fatty Acids) .[name].. 1 Cap Once Daily 5)  Slow-Mag 71.5-119 Mg Tbec (Magnesium Cl-Calcium Carbonate) .... As Needed 6)  Co Q-10 100 Mg Caps (Coenzyme Q10) .[name].. 1 Cap Once Daily 7)  B Complex  Tabs (B Complex Vitamins) .[name].. 1 Tab Once Daily 8)  Nitrostat 0.4 Mg Subl (Nitroglycerin) .[name].. 1 Tablet Under Tongue At Onset of Chest Pain; You May Repeat Every 5 Minutes For Up To 3 Doses. 9)  Flecainide Acetate 50 Mg Tabs (Flecainide Acetate) .... Take 1 Tablet By Mouth Two Times A Day 10)  Dilt-Cd 240 Mg Xr24h-Cap (Diltiazem Hcl Coated Beads) .... Take 1 Tablet By Mouth Once A Day  Allergies (verified): 1)  ! Pcn 2)  ! *  Mycins  Comments:  Nurse/Medical Assistant: The patient's medications bottles and allergies were reviewed with the patient and were updated in the Medication and Allergy Lists.  Past History:  Past Medical History: CAD, NATIVE VESSEL (ICD-414.01) BRADYCARDIA (ICD-427.89) HYPERTENSION (ICD-401.9) DIZZINESS (ICD-780.4) PAF OSA  Social History: Smoking Status:  never  Review of Systems       No fevers, chills, hemoptysis, dysphagia, melena, hematocheezia, hematuria, rash, claudication, orthopnea, pnd, pedal edema. All other systems negative.   Vital Signs:  Patient profile:   55 year old male Height:      74 inches Weight:      264 pounds Pulse rate:   57 / minute BP sitting:   146 / 86  (left arm) Cuff size:   large  Vitals Entered By: [name] (October 11, 2009 1:33 PM)  Physical Exam  Additional Exam:  GEN:55 year old male, sitting upright, in no distress HEENT: NCAT,PERRLA,EOMI NECK: palpable pulses, no bruits; no JVD; no TM LUNGS: CTA bilaterally HEART: RRR (S1S2); no significant murmurs; no rubs; no gallops ABD: soft, NT; intact BS EXT: intact distal pulses; no edema SKIN: warm, dry MUSC: no obvious deformity NEURO: A/O (x3)     EKG  Procedure date:  10/11/2009  Findings:      sinus bradycardia at 51 bpm; no ischemic changes  Impression & Recommendations:  Problem # 1:  PAROXYSMAL ATRIAL FIBRILLATION (ICD-427.31)  the patient is maintaining normal sinus rhythm, following recent singular treatment with 300 mg dose of flecainide.  He was taken off carvedilol, and discharged on Cardizem 240 mg daily. He is also about to start flecainide 50 mg b.i.d. He has a [place] score of one, and aspirin was increased to full dose. Given his slow resting heart rate, and history of bradycardia, recommendation is as follows: decrease Cardizem to 120 mg daily, and continue flecainide, as outlined. We'll schedule a repeat EKG in one week, and a routine treadmill test in 2  weeks, to rule out proarrhythmia effect. We'll plan on having patient returned for clinic follow up with Dr. Diona Browner in one month.  Problem # 2:  CAD, NATIVE VESSEL (ICD-414.01)  patient has history of nonobstructive CAD by previous catheterization in 2005. He presents with no complaint of chest pain, and recent cardiac markers were all within normal limits.  Problem # 3:  HYPERTENSION (ICD-401.9) Assessment: Comment Only  The following medications were removed from the medication list:    Carvedilol 12.5 Mg Tabs (Carvedilol) .[name]... 1 tab two times a day His updated medication list for this problem includes:    Ramipril 10 Mg Caps (Ramipril) .[name]... 1 cap once daily    Aspirin Ec 325 Mg Tbec (Aspirin) .[name]... Take one tablet by mouth daily    Diltiazem Hcl Er Beads 120 Mg Xr24h-cap (Diltiazem hcl er beads) .[name]... Take one capsule by mouth daily  Problem # 4:  OBSTRUCTIVE SLEEP APNEA (ICD-327.23) patient is advised to remain compliant with CPAP, and to follow up with Dr. Juanetta Gosling for further recommendations.  Other Orders: EKG w/ Interpretation (93000) GXT (GXT)  Patient Instructions: 1)  Decrease Diltiazem to 120mg  by mouth once daily. You will need to start new prescription. 2)  Start Flecainide 50mg  by mouth two times a day. 3)  Nurse visit for EKG in 1 week. This is scheduled for Friday, October 18, 2009 at 9am. 4)  Your physician has requested that you have an exercise tolerance test in 2 weeks.  For further information please visit [email or web address]  Please also follow instruction sheet, as given. If the results of your test are normal or stable, you will receive a letter. If they are abnormal, the nurse will contact you by phone.  5)  Follow up with Dr. Diona Browner on Friday, July 8th at 2pm. Prescriptions: FLECAINIDE ACETATE 50 MG TABS (FLECAINIDE ACETATE) Take 1 tablet by mouth two times a day  #60 x 6   Entered by:   Cyril Loosen, RN, BSN   Authorized by:   Nelida Meuse,  PA-C   Signed by:   Cyril Loosen, RN, BSN on 10/11/2009   Method used:   Electronically to        [facility]  [address] (retail)       [address]       [place], [place]  [address]       Ph: [phone]       Fax: [phone]   RxID:   [id number]   Handout requested. DILTIAZEM HCL ER BEADS 120 MG XR24H-CAP (DILTIAZEM HCL ER BEADS) Take one capsule by mouth daily  #30 x 6   Entered by:   Cyril Loosen, RN, BSN   Authorized by:   Nelida Meuse, PA-C   Signed by:   Cyril Loosen, RN, BSN on 10/11/2009   Method used:   Electronically to        [facility]  [address] (retail)       [address]  [place], [place]  [address]       Ph: [phone]       Fax: [phone]   RxID:   [id number]   Appended Document: POST [facility]-PT WILL FOLLOW IN [facility]-JM Patient seen with Mr Shara Blazing.  With symptomatic PAF we plan Flecainide 50 mg by mouth two times a day to start, continue Cardizem CD at 120 mg daily, ASA 325 mg daily, and then followup with ECG in a week and GXT in 2 weeks to rule out proarrhythmia.  May need to advance Flecainide dosing further depending on rhythm control. Also discussed better control of OSA (only uses CPAP once a week or so).  This may also help with rhythm stability as well.

## 2010-06-12 NOTE — Procedures (Signed)
Summary: Holter Final Summary  Holter Final Summary   Imported By: Cyril Loosen, RN, BSN 03/12/2010 16:50:08  _____________________________________________________________________  External Attachment:    Type:   Image     Comment:   External Document  Appended Document: Holter Final Summary Pt notified of results and verbalized understanding. Pt states he went bike riding for several hours while he had his monitor on and also went for a walk.

## 2010-06-12 NOTE — Assessment & Plan Note (Signed)
Summary: EKG, F/U START OF FLECAINIDE-JM  Nurse Visit   Vitals Entered By: Hoover Brunette, LPN (October 18, 2009 9:37 AM) Comments EKG done this a.m.   Pt. does state that he is noticing some fatique and dizziness since starting Felcainide.  (SEE EKG SCANNED IN)  Hoover Brunette, LPN  October 18, 2009 9:39 AM   If pt is taking recommended flecainide and Cardizem 120 daily, then D/C Cardizem. will reassess at clinic f/u.Nelida Meuse, PA-C  October 18, 2009 4:56 PM    Allergies: 1)  ! Pcn 2)  ! * Mycins  Orders Added: 1)  EKG w/ Interpretation [93000]  Appended Document: EKG, F/U START OF FLECAINIDE-JM Patient informed of the above via voicemail.  Appended Document: EKG, F/U START OF FLECAINIDE-JM Pt notified and verbalized understanding.   Clinical Lists Changes  Medications: Removed medication of DILTIAZEM HCL ER BEADS 120 MG XR24H-CAP (DILTIAZEM HCL ER BEADS) Take one capsule by mouth daily

## 2010-06-12 NOTE — Letter (Signed)
Summary: Engineer, materials at [facility]  [address]   [place], [place] [address]   Phone: [phone]  Fax: [phone]        November 06, 2009 MRN: [id number]    Jonathan Dyer [address] [place], [place]  [address]    Dear [name],  Your test ordered by Selena Batten has been reviewed by your physician (or physician assistant) and was found to be normal or stable. Your physician (or physician assistant) felt no changes were needed at this time.  ____ Echocardiogram  __X__ Cardiac Stress Test-Continue current medications  ____ Lab Work  ____ Peripheral vascular study of arms, legs or neck  ____ CT scan or X-ray  ____ Lung or Breathing test  ____ Other:   Thank you.   Cyril Loosen, RN, BSN    Duane Boston, M.D., F.A.C.C. Thressa Sheller, M.D., F.A.C.C. Oneal Grout, M.D., F.A.C.C. Cheree Ditto, M.D., F.A.C.C. Daiva Nakayama, M.D., F.A.C.C. Kenney Houseman, M.D., F.A.C.C. Jeanne Ivan, PA-C

## 2010-06-12 NOTE — Progress Notes (Signed)
Summary: [facility] Follow Up  ---- Converted from flag ---- ---- 10/08/2009 9:37 AM, [name], MD, [facility] wrote: Got note from GD about this patient needing to be seen.  He has followed with TW most recently in [place] office although I have also seen him before.  It might be easier to see him in [place] later this week - maybe he could be on Gene's schedule on a day when I am there. ------------------------------    Spoke with Jonathan Dyer. He would like to follow in [place]. He will be seen on Friday, June 3rd at 1:30.

## 2010-06-12 NOTE — Letter (Signed)
Summary: [place] Results Engineer, agricultural at [facility]  [address] [place] [address]   Phone: [phone]  Fax: [phone]      Sep 10, 2009 MRN: [id number]   Jonathan Dyer [address] [place], [place]  [address]   Dear Mr. STUTZ,  Your test ordered by Selena Batten has been reviewed by your physician (or physician assistant) and was found to be normal or stable. Your physician (or physician assistant) felt no changes were needed at this time.  ____ Echocardiogram  ____ Cardiac Stress Test  __X__ Lab Work  ____ Peripheral vascular study of arms, legs or neck  __X__ CT scan or X-ray  ____ Lung or Breathing test  ____ Other: Please continue with current medical treatment.  Thank you.   Valera Castle, MD, F.A.C.C

## 2010-06-12 NOTE — Letter (Signed)
Summary: Engineer, materials at [facility]  [address]   [place], [place] [address]   Phone: [phone]  Fax: [phone]        October 30, 2009 MRN: [id number]    Jonathan Dyer [address] [place], [place]  [address]    Dear Mr. NYDAM,  Your test ordered by Selena Batten has been reviewed by your physician (or physician assistant) and was found to be normal or stable. Your physician (or physician assistant) felt no changes were needed at this time.  ____ Echocardiogram  __X__ Cardiac Stress Test-Continue medication regimen  ____ Lab Work  ____ Peripheral vascular study of arms, legs or neck  ____ CT scan or X-ray  ____ Lung or Breathing test  ____ Other:   Thank you.   Cyril Loosen, RN, BSN    Duane Boston, M.D., F.A.C.C. Thressa Sheller, M.D., F.A.C.C. Oneal Grout, M.D., F.A.C.C. Cheree Ditto, M.D., F.A.C.C. Daiva Nakayama, M.D., F.A.C.C. Kenney Houseman, M.D., F.A.C.C. Jeanne Ivan, PA-C

## 2010-06-20 ENCOUNTER — Other Ambulatory Visit (HOSPITAL_COMMUNITY): Payer: Self-pay | Admitting: Pulmonary Disease

## 2010-06-20 ENCOUNTER — Ambulatory Visit (HOSPITAL_COMMUNITY)
Admission: RE | Admit: 2010-06-20 | Discharge: 2010-06-20 | Disposition: A | Discharge status: Home / Self Care | Payer: Managed Care, Other (non HMO) | Source: Ambulatory Visit | Attending: Pulmonary Disease | Admitting: Pulmonary Disease

## 2010-06-20 DIAGNOSIS — M5137 Other intervertebral disc degeneration, lumbosacral region: Secondary | ICD-10-CM | POA: Insufficient documentation

## 2010-06-20 DIAGNOSIS — M549 Dorsalgia, unspecified: Secondary | ICD-10-CM

## 2010-06-20 DIAGNOSIS — M79609 Pain in unspecified limb: Secondary | ICD-10-CM | POA: Insufficient documentation

## 2010-06-20 DIAGNOSIS — M545 Low back pain, unspecified: Secondary | ICD-10-CM | POA: Insufficient documentation

## 2010-06-20 DIAGNOSIS — M51379 Other intervertebral disc degeneration, lumbosacral region without mention of lumbar back pain or lower extremity pain: Secondary | ICD-10-CM | POA: Insufficient documentation

## 2010-07-03 ENCOUNTER — Ambulatory Visit (HOSPITAL_COMMUNITY)
Admission: RE | Admit: 2010-07-03 | Discharge: 2010-07-03 | Disposition: A | Discharge status: Home / Self Care | Payer: Worker's Compensation | Source: Ambulatory Visit | Attending: Pulmonary Disease | Admitting: Pulmonary Disease

## 2010-07-03 DIAGNOSIS — IMO0001 Reserved for inherently not codable concepts without codable children: Secondary | ICD-10-CM | POA: Insufficient documentation

## 2010-07-03 DIAGNOSIS — M25669 Stiffness of unspecified knee, not elsewhere classified: Secondary | ICD-10-CM | POA: Insufficient documentation

## 2010-07-03 DIAGNOSIS — Z96659 Presence of unspecified artificial knee joint: Secondary | ICD-10-CM | POA: Insufficient documentation

## 2010-07-03 DIAGNOSIS — M25569 Pain in unspecified knee: Secondary | ICD-10-CM | POA: Insufficient documentation

## 2010-07-03 DIAGNOSIS — M25519 Pain in unspecified shoulder: Secondary | ICD-10-CM | POA: Insufficient documentation

## 2010-07-27 LAB — POCT I-STAT 4, (NA,K, GLUC, HGB,HCT)
HCT: 41 % (ref 39.0–52.0)
Hemoglobin: 13.9 g/dL (ref 13.0–17.0)
Potassium: 4 mEq/L (ref 3.5–5.1)

## 2010-07-28 ENCOUNTER — Encounter: Payer: Self-pay | Admitting: *Deleted

## 2010-08-06 ENCOUNTER — Encounter: Payer: Self-pay | Admitting: Cardiology

## 2010-08-07 ENCOUNTER — Encounter: Payer: Self-pay | Admitting: Cardiology

## 2010-08-07 ENCOUNTER — Ambulatory Visit (INDEPENDENT_AMBULATORY_CARE_PROVIDER_SITE_OTHER): Payer: Managed Care, Other (non HMO) | Admitting: Cardiology

## 2010-08-07 VITALS — BP 147/97 | HR 80 | Ht 75.0 in | Wt 263.0 lb

## 2010-08-07 DIAGNOSIS — I2510 Atherosclerotic heart disease of native coronary artery without angina pectoris: Secondary | ICD-10-CM

## 2010-08-07 DIAGNOSIS — I4891 Unspecified atrial fibrillation: Secondary | ICD-10-CM

## 2010-08-07 DIAGNOSIS — I10 Essential (primary) hypertension: Secondary | ICD-10-CM

## 2010-08-07 NOTE — Assessment & Plan Note (Signed)
Nonobstructive based on prior assessment with followup reassuring stress testing. No active angina.

## 2010-08-07 NOTE — Patient Instructions (Signed)
The current medical regimen is effective;  continue present plan and medications.  Follow up in 6 months

## 2010-08-07 NOTE — Assessment & Plan Note (Signed)
Relatively quiescent at this point. Continue present regimen.

## 2010-08-07 NOTE — Assessment & Plan Note (Signed)
Blood pressure elevated, we discussed this today. Recommend sodium restriction, weight loss. He may need an additional agent if not effective.

## 2010-08-07 NOTE — Progress Notes (Signed)
Clinical Summary Jonathan Dyer is a 55 y.o.male presenting for routine followup. He was seen in October of last year.  Cardiac monitor from November of last year showed sinus rhythm, rare PACs and PVCs, no clear atrial fibrillation however.  He reports no exertional chest pain, states he rode his bike "30 miles" last week with "no problems." He still has occasional shortness of breath with yard work, no progressive palpitations however.  His blood pressure is elevated today, he states that this has been the case when he checks it sometimes. His Altace is already at the dose, and we continue to discuss sodium restriction and weight loss. He may need additional medication, and we did review this possibility.   Allergies  Allergen Reactions  . Mycinettes   . Penicillins     Current outpatient prescriptions:aspirin 325 MG tablet, Take 325 mg by mouth daily.  , Disp: , Rfl: ;  b complex vitamins tablet, Take 1 tablet by mouth daily.  , Disp: , Rfl: ;  flecainide (TAMBOCOR) 50 MG tablet, Take 50 mg by mouth 2 (two) times daily.  , Disp: , Rfl: ;  nitroGLYCERIN (NITROSTAT) 0.4 MG SL tablet, Place 0.4 mg under the tongue every 5 (five) minutes as needed.  , Disp: , Rfl:  Omega-3 Fatty Acids (FISH OIL) 1000 MG CAPS, Take one by mouth daily  , Disp: , Rfl: ;  ramipril (ALTACE) 10 MG capsule, Take 10 mg by mouth daily.  , Disp: , Rfl: ;  ezetimibe-simvastatin (VYTORIN) 10-40 MG per tablet, Take 1 tablet by mouth at bedtime.  , Disp: , Rfl:   Past Medical History  Diagnosis Date  . OSA (obstructive sleep apnea)   . Atrial fibrillation     Paroxysmal  . Coronary atherosclerosis of native coronary artery     Nonobstructive, LVEF normal  . Essential hypertension, benign     Social History Jonathan Dyer reports that he has never smoked. He has never used smokeless tobacco. Jonathan Dyer reports that he does not drink alcohol.  Review of Systems No fevers, chills, or unusual weight change. No chest  pain, progressive shortness of breath, cough, hemoptysis, or wheezing. No palpitations, dizziness, or syncope. Otherwise systems reviewed and negative except as already outlined.   Physical Examination Filed Vitals:   08/07/10 1416  BP: 147/97  Pulse: 80  Overweight male in no acute distress. HEENT: Conjunctiva and lids normal, oral pharynx clear. Neck: Supple, no carotid bruits or elevated JVP. Lungs: Clear to auscultation, nonlabored. Cardiac: Regular rate and rhythm, no S3 gallop. Abdomen: Soft, nontender, bowel sounds present. Extremities: No pitting edema, distal pulses full. Skin: Warm and dry. Musculoskeletal: No kyphosis. Neuropsychiatric: Alert and oriented x3, affect appropriate.   ECG Normal sinus rhythm at 68 beats per minute, QTC 421 ms.   Problem List and Plan

## 2010-08-12 ENCOUNTER — Other Ambulatory Visit: Payer: Self-pay | Admitting: Physician Assistant

## 2010-08-13 LAB — CBC
HCT: 35.1 % — ABNORMAL LOW (ref 39.0–52.0)
HCT: 44.1 % (ref 39.0–52.0)
Hemoglobin: 11.9 g/dL — ABNORMAL LOW (ref 13.0–17.0)
Hemoglobin: 12 g/dL — ABNORMAL LOW (ref 13.0–17.0)
Hemoglobin: 15.2 g/dL (ref 13.0–17.0)
MCHC: 33.8 g/dL (ref 30.0–36.0)
MCHC: 34.3 g/dL (ref 30.0–36.0)
MCHC: 34.9 g/dL (ref 30.0–36.0)
MCHC: 34.4 g/dL (ref 30.0–36.0)
MCV: 90.8 fL (ref 78.0–100.0)
MCV: 90.6 fL (ref 78.0–100.0)
Platelets: 186 10*3/uL (ref 150–400)
RBC: 3.56 MIL/uL — ABNORMAL LOW (ref 4.22–5.81)
RBC: 3.86 MIL/uL — ABNORMAL LOW (ref 4.22–5.81)
RBC: 4.87 MIL/uL (ref 4.22–5.81)
RDW: 13.2 % (ref 11.5–15.5)
RDW: 13.4 % (ref 11.5–15.5)
RDW: 12.9 % (ref 11.5–15.5)

## 2010-08-13 LAB — PROTIME-INR
INR: 0.95 (ref 0.00–1.49)
Prothrombin Time: 12.6 seconds (ref 11.6–15.2)

## 2010-08-13 LAB — COMPREHENSIVE METABOLIC PANEL
ALT: 32 U/L (ref 0–53)
BUN: 9 mg/dL (ref 6–23)
CO2: 34 mEq/L — ABNORMAL HIGH (ref 19–32)
Calcium: 9.3 mg/dL (ref 8.4–10.5)
Creatinine, Ser: 1.05 mg/dL (ref 0.4–1.5)
GFR calc non Af Amer: >60 mL/min (ref >60)
Glucose, Bld: 101 mg/dL — ABNORMAL HIGH (ref 70–99)
Sodium: 142 mEq/L (ref 135–145)
Total Protein: 7 g/dL (ref 6.0–8.3)

## 2010-08-13 LAB — BASIC METABOLIC PANEL
BUN: 6 mg/dL (ref 6–23)
CO2: 28 mEq/L (ref 19–32)
CO2: 30 mEq/L (ref 19–32)
Calcium: 8.2 mg/dL — ABNORMAL LOW (ref 8.4–10.5)
Chloride: 101 mEq/L (ref 96–112)
GFR calc Af Amer: >60 mL/min (ref >60)
Glucose, Bld: 124 mg/dL — ABNORMAL HIGH (ref 70–99)
Glucose, Bld: 132 mg/dL — ABNORMAL HIGH (ref 70–99)
Potassium: 3.9 mEq/L (ref 3.5–5.1)
Sodium: 134 mEq/L — ABNORMAL LOW (ref 135–145)
Sodium: 135 mEq/L (ref 135–145)

## 2010-08-13 LAB — DIFFERENTIAL
Eosinophils Absolute: 0.3 10*3/uL (ref 0.0–0.7)
Lymphocytes Relative: 25 % (ref 12–46)
Lymphs Abs: 1.8 10*3/uL (ref 0.7–4.0)
Monocytes Relative: 7 % (ref 3–12)
Neutro Abs: 4.4 10*3/uL (ref 1.7–7.7)
Neutrophils Relative %: 63 % (ref 43–77)

## 2010-08-13 LAB — TYPE AND SCREEN
ABO/RH(D): A POS
Antibody Screen: NEGATIVE

## 2010-08-13 LAB — URINALYSIS, ROUTINE W REFLEX MICROSCOPIC
Bilirubin Urine: NEGATIVE
Hgb urine dipstick: NEGATIVE
Ketones, ur: NEGATIVE mg/dL
Nitrite: NEGATIVE

## 2010-08-13 LAB — APTT: aPTT: 32 seconds (ref 24–37)

## 2010-09-01 ENCOUNTER — Other Ambulatory Visit: Payer: Self-pay | Admitting: Cardiology

## 2010-09-23 NOTE — Assessment & Plan Note (Signed)
[facility]                       [place] CARDIOLOGY OFFICE NOTE   NAME:Jonathan Dyer, Jonathan Dyer                      MRN:          [id number]  DATE:06/18/2008                            DOB:          [birth year]    PRIMARY CARE PHYSICIAN:  Ramon Dredge L. Juanetta Gosling, MD   REASON FOR VISIT:  Chest pain.   HISTORY OF PRESENT ILLNESS:  I saw Jonathan Dyer several years ago back  in our [facility] in June 2005.  We have followed him over the years  with a history of previously diagnosed nonobstructive coronary artery  disease involving the distal left anterior descending and a small  diagonal branch.  He has had intermittent episodes of chest pain,  although not all clearly anginal in nature.  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  He states  that he did 3 rides in January, the longest ride which was 22 miles.  He  had some intermittent episodes of chest discomfort describing an ache in  his central chest area, sometimes up into his left shoulder and his left  neck.  This is fairly sporadic and not always specifically associated  with exertion, however.  He has not had any interval ischemic testing  since April 2006.  At that time, he underwent an exercise Myoview.  This  demonstrated an ejection fraction of 55% without diagnostic ST-segment  changes.  Uniform perfusion was noted without evidence of ischemia in  the setting of diaphragmatic attenuation.  Today's electrocardiogram  shows sinus bradycardia with an RSR prime and early repolarization  changes which are old.  Dr. Juanetta Gosling saw him recently and placed him on  low-dose Imdur 30 mg daily.  He has not been able to take this due to  problems with headache, however.   ALLERGIES:  PENICILLIN and AMOXICILLIN.   Present medications include ramipril 10 mg p.o. daily, carvedilol 25 mg  p.o. b.i.d., Vytorin 10/40 mg p.o. daily, Imdur 30 mg p.o. daily,  aspirin 325 mg p.o. daily, omega 3 supplements, and multivitamin 3 times  a week and Tylenol extra strength p.r.n.   PAST MEDICAL HISTORY:  As outlined above.  He has undergone previous  tonsillectomy and a left anterior cruciate ligament reconstruction.  More recently, he underwent right shoulder surgery in October 2008 and  also a repeat knee surgery in December 2008.  There is no history of  diabetes mellitus.   SOCIAL HISTORY:  The patient is single.  He has 3 children.  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  No active tobacco or  alcohol use.  He has not been exercising as regularly over the last  several months to a year.   FAMILY HISTORY:  Significant for diabetes mellitus and the patient's  mother who died of a pulmonary embolus.  He does have a brother with a  history of hypertension  and hyperlipidemia.  His father died of  pulmonary complications.   REVIEW OF SYSTEMS:  As detailed above.  He has some limiting knee pain  which is chronic, no palpitations, no orthopnea or PND.  He has had no  syncope.  Otherwise negative.   PHYSICAL EXAMINATION:  VITAL SIGNS:  Blood pressure 120/80, heart rate  is 85, weight is 255 pounds.  GENERAL:  An overweight male in no acute distress.  HEENT:  Conjunctiva is normal.  Oropharynx is clear.  NECK:  Supple.  No elevated  jugular venous pressure.  No audible  bruits.  No thyromegaly is noted.  LUNGS:  Clear without labored breathing at rest.  CARDIAC:  Regular rate and rhythm.  No pathologic systolic murmur or S3  gallop.  No pericardial rub.  ABDOMEN:  Soft, nontender.  No bowel sounds.  EXTREMITIES:  Exhibit no frank pitting edema.  His pulse is 2+.  SKIN:  Warm and dry.  MUSCULOSKELETAL:  No kyphosis noted.  NEUROPSYCHIATRIC:  The patient is alert and oriented x3.  Affect  is  appropriate.   IMPRESSION AND RECOMMENDATIONS:  Recurrent chest pain in the setting of  previously documented nonobstructive coronary artery disease at cardiac  catheterization in 2005.  It is possible that he is experiencing some  angina, although he has been less conditioned and not exercising  regularly over the last year.  His electrocardiogram does not show any  acute ST changes.  I have asked him to decrease the Imdur to 15 mg in  the evening and will schedule an exercise Myoview to follow up on any  potential progression and ischemic heart disease.  He will follow up in  the office within a few weeks to review the results.  Otherwise, he will  continue his present medications.     Jonelle Sidle, MD  Electronically Signed    SGM/MedQ  DD: 06/18/2008  DT: 06/19/2008  Job #: [id number]   cc:   Ramon Dredge L. Juanetta Gosling, M.D.

## 2010-09-23 NOTE — Op Note (Signed)
NAME:  Jonathan Dyer, Jonathan Dyer               ACCOUNT NO.:  [id number]   MEDICAL RECORD NO.:  [id number]          PATIENT TYPE:  AMB   LOCATION:  [facility]                         FACILITY:  [facility]   PHYSICIAN:  Erasmo Leventhal, M.D.DATE OF BIRTH:  1955/11/21   DATE OF PROCEDURE:  03/11/2007  DATE OF DISCHARGE:  03/11/2007                               OPERATIVE REPORT   PREOP DIAGNOSES:  1. Right shoulder impingement syndrome.  2. Acromioclavicular arthritis.  3. Rotator cuff tear.   POSTOPERATIVE DIAGNOSES:  1. Right shoulder impingement syndrome.  2. Acromioclavicular arthritis.  3. Rotator cuff tear.   PROCEDURE:  1. Right shoulder exam under anesthesia.  2. Glenohumeral diagnostic arthroscopy.  3. Arthroscopic subacromial decompression.  4. Arthroscopic distal clavicle resection Mumford procedure.  5. Arthroscopic rotator cuff repair.   SURGEON:  Erasmo Leventhal, M.D.   ASSISTANT:  Jaquelyn Bitter. Chabon, P.A.   ANESTHESIA:  Interscalene block general.   ESTIMATED BLOOD LOSS:  Less than 10 mL.   DRAINS:  None.   COMPLICATIONS:  None.   DISPOSITION:  PACU stable.   OPERATIVE DETAILS:  The patient is counseled in the holding area,  correct side was identified, IV started, antibiotics given, block was  administered.  Taken to the operating room, placed in the supine  position, general anesthesia, turned to a left lateral decubitus  position, properly padded, and bump.  Right shoulder had been examined,  full range of motion is stable.  Prepped with DuraPrep and draped in a  sterile fashion.  Overhead shoulder positioner was utilized at 30  degrees abduction, 10 degrees of forward flexion, and 15 pounds of  longitudinal traction.  Posterior portal was created.  Arthroscope  placed into the glenohumeral joint.  Diagnostically it revealed normal  biceps labrum, articular cartilage of the glenohumeral ligaments.  There  was a full-thickness tear of rotator cuff inspected on  the supraspinatus  insertion site.  The scope was then placed.  In the subacromial region.  Lateral portal was established.  A subacromial bursectomy was performed.  Rotator cuff was found be minimally retracted.  It was mobilized, edges  were freshened back to healthy tissue, and repair site on the rotator  cuff footprint was repaired.   The ArthroCare system was utilized to release the periosteum and CA  ligament.  Bur was then placed posteriorly and an anterior-inferior  acromioplasty was performed converting to a flat acromion morphology.  AC joint was seen to be markedly osteoarthritic with underlying  subclavicular spur.  Accessory entry portal was made, and the lateral 5-  8 mm of clavicle was removed circumferentially leaving the superior and  posterior acromioclavicular ligaments and capsule intact.   Clavicle was palpated and found to be stable.  Arthroscopic debris was  removed.  A small puncture hole was then made, and an Arthrex  bioabsorbable anchor was placed at the appropriate level.  Mattress  sutures were placed, tied down sequentially.  I then made another  lateral puncture wound, staying proximal to the axillary nerve, but  going into the lateral aspect of the proximal humerus at the appropriate  level for the Posh lock LC anchor.  Sutures were then placed into this.  The Posh lock anchor was placed, given an excellent double-row  technique, completely covering the rotator cuff footprint at the  supraspinatus insertion site with a well-balanced, well-connected  reattached tendon.  Hemostasis was obtained.  Debris was removed. The  arthroscopic equipment was removed, taken out of traction.  He had  normal pulses in the hand at the end of the case.  Portals were closed  with running 4-0 nylon sutures and another 20 mL of 0.5% Marcaine with  epinephrine placed in the portal sites subacromial region for pain.  Sterile dressing was applied, placed into a sling, turned  supine, and  awakened.  He was taken to the operating and PACU in stable condition.  Sponge and count were correct.  No complications or problems.  The  patient was transported to the PACU in stable condition.   Help with surgical decision making technique, Mr. Leilani Able, PA-C  assisted in his entire case.           ______________________________  Erasmo Leventhal, M.D.     RAC/MEDQ  D:  03/11/2007  T:  03/13/2007  Job:  [id number]

## 2010-09-23 NOTE — Assessment & Plan Note (Signed)
[facility]                       [place] CARDIOLOGY OFFICE NOTE   NAME:Fraizer, [name]                      MRN:          [id number]  DATE:08/30/2008                            DOB:          [birth year]    [name] comes in today for followup.   He has a history of nonobstructive coronary artery disease by cath in  2005.  He presented with some butterfly sensations in his chest with  some dizziness without frank syncope.  An event monitor was placed to  rule out any significant dysrhythmias.   In addition, he had a recent stress Myoview on June 26, 2008, went a  total of 13.4 mets, maximum heart rate 137, which is 82% of age  predicted maximum, blood pressure did increase from 160/90 to 230/80 in  early recovery with baseline marked sinus brady at 44 beats per minute  with an RSR prime which is old at baseline.  His Myoview showed an EF of  55% with normal wall function and no evidence of ischemia or infarction.   His event recorder has shown some sinus bradycardia down as low as 45  beats per minute.  However, he was asymptomatic at the lowest level.  When he did have some lightheadedness, he had heart rates between 49 and  65 and when he had fluttering and dizziness, he had 1 PVC.   He is an avid bike rider and actually rides with one of my patients, Mr.  Doylene Canning.  He is asymptomatic with this as well.   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  His company  physician said he could not take isosorbide mononitrate, which was  recently prescribed.  I do not think he needs this drug.   He is currently on:  1. Altace 10 mg per day.  2. Vytorin 10/40 daily.  3. Aspirin 325 mg daily.  4. Fish oil 1000 mg daily.  5. Also takes Slow-Mag p.r.n.   His blood pressure today is 142/90, his pulse 64 and regular, and his  weight is 258.  The rest exam is unchanged.   I had a long talk with [name].  I have cleared him to  drive a  truck and also to do his usual activities.  I have him talked about  symptomatic bradycardia and how to respond.  I have also given  sublingual nitroglycerin just in case he has any angina or unstable  symptoms down the road.  I do not suspect he will.   I will see him back in a year.      Thomas C. Daleen Squibb, MD, [facility]  Electronically Signed    TCW/MedQ  DD: 08/30/2008  DT: 08/31/2008  Job #: [id number]   cc:   Ramon Dredge L. Juanetta Gosling, M.D.

## 2010-09-23 NOTE — Assessment & Plan Note (Signed)
[facility]                       [place] CARDIOLOGY OFFICE NOTE   NAME:Jonathan Dyer, Jonathan Dyer                      MRN:          [id number]  DATE:07/05/2008                            DOB:          04-02-1956    PRIMARY CARE PHYSICIAN:  Ramon Dredge L. Juanetta Gosling, MD   REASON FOR VISIT:  Followup cardiac testing.   HISTORY OF PRESENT ILLNESS:  I saw Jonathan Dyer back in early February.  He had been experiencing some intermittent chest pain with a history of  previously documented nonobstructive coronary artery disease at cardiac  catheterization in 2005.  He was actually placed on a long-acting  nitrate for symptom control, given the possibility of angina and I  referred him for objective ischemic testing via an exercise Myoview.  He  did have sinus bradycardia noted at baseline with a heart rate of 44  beats per minute and with stress achieved 82% of his maximum predicted  heart rate with occasional premature ventricular complexes, but no  sustained arrhythmias and no ischemic ST-segment changes.  His perfusion  data showed normal left ventricular systolic function with an ejection  fraction of 55% and no clear evidence of ischemia.  Mild diaphragmatic  attenuation was noted.  Jonathan Dyer reports that he does have  occasional episodes of chest discomfort, although nothing regularly with  exertion.  He has in fact ridden his bicycle on 22 an 25 mile rides  since his stress test without symptoms.  He does report to me some  occasional butterfly sensation in his chest and occasionally feeling  of dizziness lasting a few seconds.  He has not had any syncope.  Today,  we talked about placing an event recorder to exclude any obvious  dysrhythmias that might be contributing to his symptoms.  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   PRESENT MEDICATIONS:  1. Altace 10 mg p.o. daily.  2. Vytorin 10/40 mg  p.o. daily.  3. Aspirin 325 mg p.o. daily.  4. Omega-3 supplements 1000 mg p.o. daily.  5. Slow-Mag 1 p.o. b.i.d. p.r.n.   REVIEW OF SYSTEMS:  As outlined above.  Otherwise, negative.   PHYSICAL EXAMINATION:  VITAL SIGNS:  Blood pressure is 130/80, heart  rate is 68, and weight 258 pounds.  GENERAL:  He is an overweight male in no acute distress.  NECK:  No elevated jugular venous pressure.  No loud bruits.  No  thyromegaly is noted.  LUNGS:  Breathing at rest.  CARDIAC:  Regular rhythm.  No murmur, rub, or gallop.  ABDOMEN:  Soft and nontender.  Normal bowel sounds.  EXTREMITIES:  No pitting edemas.   IMPRESSION:  1. Previously documented history of nonobstructive coronary artery      disease at cardiac catheterization in 2005.  Recent follow up      exercise Myoview is low risk without frank ischemia, at 82% of the      maximum predicted heart rate and in the absence of exertional chest      pain.  Ejection fraction was 55%.  I do not anticipate further      ischemic assessment at this point, unless he manifests progressive      symptomatology.  2. History of butterfly sensation in the chest and some dizziness      without frank syncope.  To exclude any obvious dysrhythmias      contributing to his symptom complex, an event recorder will be      placed.  I will have him follow up with me over the next month.  No      medication changes were otherwise made.  He is not on any rate-      lowering medications at this point.     Jonelle Sidle, MD  Electronically Signed    SGM/MedQ  DD: 07/05/2008  DT: 07/06/2008  Job #: [id number]   cc:   Ramon Dredge L. Juanetta Gosling, M.D.

## 2010-09-23 NOTE — Assessment & Plan Note (Signed)
[facility]                       [facility] CARDIOLOG[facility] OFFICE NOTE   NAME:Dyer, Jonathan USERY                      MRN:          [id number]  DATE:11/03/2006                            DOB:          [birth year]    REFERRING PHYSICIAN:  Ramon Dredge L. Juanetta Dyer, M.D.   Jonathan Dyer is seen today at the request of Dr. Juanetta Dyer.   The patient has been having chest pain.  He was cathed in March, 2005.  He had 80% distal LAD disease and a small 80% first diagonal branch  lesion.  No major epicardial disease.  He was treated medically.  His  coronary risk factors include hypertension and hyperlipidemia.  He is a  nonsmoker.  Nondiabetic.   The patient has been having symptoms for 3-4 weeks.  He gets a banding  or tightness around his rib cage area.  It can last a few minutes.  When  he gets this discomfort, he occasionally has some shortness of breath.  The pain or pressure subsides spontaneously.  There is no radiation to  the neck or shoulders.  The patient has a prescription for nitro spray  but has never filled it.  Nitroglycerin gives him a horrible headache,  which can last 24 hours, and he is very reluctant to take it.  The  symptoms have not been progressive but have been persistent over the  last 3-4 weeks.   Patient also has had occasional palpitations or flip-flops in his chest.  He can have them at night when he lays down.  He does not tend to get  them with exertion.  He does note that if he has palpitations or this  banding sensation, sometimes exercise actually makes him feel better.   His review of systems is remarkable for no history of DVT or PE.  He is  a Agricultural consultant.  There has been no history of myocardial  infarction.  He has a friend who is a cardiologist in [place] who  did a stress test on him last year in March, and he said it was fine.  The last stress Myoview done here in [place] was done in April, 2006,  and  the patient was able to exercise for 11 minutes, 15 seconds, and had  a normal Myoview.  Review of systems otherwise negative.   PAST MEDICAL HISTORY:  Otherwise remarkable for hypertension,  dyslipidemia, and coronary disease.  He has had previous knee surgeries  x4.   He denies any allergies but is intolerant to nitrates.   MEDICATIONS:  1. Altace 10 daily.  2. Vytorin 10/40.  3. Aspirin daily.  4. CoEnzyme Q.  5. Red yeast rice.  6. Fish oil.  7. Slow mag.  8. Osteo Bi-Flex.   FAMILY HISTORY:  Noncontributory.   The patient is divorced.  He has three older children, 6, 24, and 31.  He drives a truck long distance.  He enjoys riding his bike.  He wants  to participate in the [facility] 40 mile ride in approximately 10 days.  He was concerned about these symptoms and wanted to be evaluated  before  the ride.   PHYSICAL EXAMINATION:  GENERAL:  Remarkable for a tall white male in no  distress.  Affect is appropriate.  VITAL SIGNS:  Weight is 245.  Blood pressure is 140/80.  Pulse 60 and  regular.  Respiratory rate is 14.  He is afebrile.  HEENT:  Normal.  NECK:  Carotids are normal without bruit.  There is no lymphadenopathy.  No thyromegaly.  No JVP elevation.  LUNGS:  Clear with good diaphragmatic motion.  No wheezing.  CARDIAC:  There is an S1 and S2 with normal heart sounds.  PMI is  normal.  ABDOMEN:  Bowel sounds are positive.  There is no tenderness,  hepatosplenomegaly, or hepatojugular reflux.  No AAA or bruits.  Femorals are 4+ bilaterally, without bruit.  EXTREMITIES:  PT's are +3.  There is no lower extremity edema or  varicosities.  NEURO:  Nonfocal.  There are no muscular weaknesses.   Electrocardiogram is remarkable for being in normal sinus rhythm.  It is  entirely normal with a P-R interval of 178.   IMPRESSION:  1. Somewhat vague symptoms of tightness around the chest, not      necessarily exertional.  A history of distal left anterior       descending artery and small diagonal branch disease.  I think the      patient should have a stress Myoview study.  I would not cath him      unless he has a large territory of reversibility.  The patient is      reluctant to take nitroglycerin since it causes such a bad      headache.  He will continue his current medications.  He is fairly      athletic and has a resting heart rate of 60, and I suspect this is      why he has not been placed on the beta blocker in the past.  We      will have to see what his response to exercise is, but this may be      worthwhile adding, as long as it does not cause excessive fatigue.  2. Hypertension:  Currently well controlled, on Altace.  3. Hyperlipidemia with small vessel disease:  Continue Vytorin 10/40.      Follow up lipid and liver profile in six months.  4. Patient will be cleared to ride in his bike race in 10 days, as      long as his Myoview is low risk.  He will follow up with Dr.      Juanetta Dyer for his general medical care.    Jonathan Dyer. Jonathan Emms, MD, [facility]  Electronically Signed   PCN/MedQ  DD: 11/03/2006  DT: 11/04/2006  Job #: [id number]   cc:   Jonathan Dyer, M.D.

## 2010-09-26 NOTE — Op Note (Signed)
NAME:  Jonathan Dyer, Jonathan Dyer               ACCOUNT NO.:  [id number]   MEDICAL RECORD NO.:  [id number]          PATIENT TYPE:  AMB   LOCATION:  DAY                           FACILITY:  [facility]   PHYSICIAN:  Kassie Mends, M.D.      DATE OF BIRTH:  22-Mar-1956   DATE OF PROCEDURE:  05/24/2006  DATE OF DISCHARGE:                               OPERATIVE REPORT   PROCEDURE:  Colonoscopy with cold forceps polypectomy.   INDICATION FOR EXAM:  Mr. Barberi is a 55 year old male who has a  significant past medical history of hypertension and hyperlipidemia and  coronary artery disease with nonobstructive disease.  He was taking a 25-  30 mile bike ride in December 2007 and subsequently developed  hematochezia and diarrhea.  His symptoms lasted less than 7-10 days.  He  has had no additional problems.  He states he drinks a 24 ounce bottle  of Gatorade and a 20 ounce bottle of water during his bike rides.  He  takes 25-30 mile bike rides on a regular basis.  He has never had a  colonoscopy.  He denies any family history of colon cancer or colon  polyps.  He presents for evaluation of hematochezia, diarrhea, and  screening.   FINDINGS:  1. 3 mm sigmoid colon polyp removed via cold forceps.  Otherwise      normal colon without evidence of masses, inflammatory changes, AVMs      or diverticula.  2. Normal retroflex view of the rectum.   RECOMMENDATIONS:  1. His diarrhea and hematochezia were likely secondary to ischemic      colitis which is now resolved.  2. No aspirin and anti-inflammatory drugs or anticoagulation for 3      days.High fiber diet.  He is given a handout on high-fiber diet and      polyps.  3. I will call him with the results of his biopsy.  If his polyps are      adenomatous, then his next screening colonoscopy needs to be in 5      years.  If his polyps are adenomatous, then his siblings and      children need to have their first screening colonoscopy at age 61      and then every 5  years.  4. Follow with Dr. Shaune Pollack.   MEDICATIONS:  1. Demerol 75 mg IV.  2. Versed 7 mg IV.   PROCEDURE TECHNIQUE:  Physical exam was performed.  Informed consent was  obtained from the patient after explaining the benefits, risks and  alternatives to the procedure.  The patient was connected to the monitor  and placed in left lateral position.  Continuous oxygen was provided by  nasal cannula and IV medicine administered through an indwelling  cannula.  After administration of sedation and rectal exam, the  patient's rectum was intubated.  The scope  was advanced under direct visualization to the cecum.  The scope was  subsequently slowly by carefully examining the color, texture, anatomy  and integrity of mucosa on the way out.  The patient was recovered in  the endoscopy suite and discharged home in satisfactory condition.      Kassie Mends, M.D.  Electronically Signed     SM/MEDQ  D:  05/24/2006  T:  05/24/2006  Job:  [id number]   cc:   Ramon Dredge L. Juanetta Gosling, M.D.  Fax: [phone]

## 2010-09-26 NOTE — Assessment & Plan Note (Signed)
[facility]                                 ON-CALL NOTE   NAME:[name], [name]                      MRN:          [id number]  DATE:08/03/2008                            DOB:          07-09-1955    DATE OF PHONE CONVERSATION:  August 03, 2008   I was paged on-call the evening of August 03, 2008, by [employer] because  the patient of Dr. Dietrich Pates, [name], apparently had an  abnormally slow heart rate with no significant pauses.  The patient was  contacted and the patient was unaware that his heart rate had been  slightly slow.  He denies any nausea, vomiting, chest pain, dizziness,  or significant change from intermittent chronic palpitation symptoms.  The patient also reports riding his bike 2-4 times a week for greater  than 20-40 miles each time without any exertional chest pain or  significant changes in his chronic palpitation symptoms.  The patient  was instructed to be vigil necessary symptomatology and should he have  any significant changes in his baseline state of health, he should at  the minimum call with questions; but anymore serious concern, should  present to the emergency department for evaluation.  The patient  indicated that he fully understood these instructions, but that he is  feeling fine and had been over the last several weeks.  At the end of  the phone conversation, the patient had no questions or concerns that  had not been addressed.     Jarrett Ables, [facility]     MS/MedQ  DD: 08/04/2008  DT: 08/05/2008  Job #: [id number]

## 2010-09-26 NOTE — Procedures (Signed)
NAME:  Jonathan Dyer, Jonathan Dyer               ACCOUNT NO.:  [id number]   MEDICAL RECORD NO.:  [id number]          PATIENT TYPE:  OUT   LOCATION:                                FACILITY:  [facility]   PHYSICIAN:  Vida Roller, M.D.   DATE OF BIRTH:  1956-04-29   DATE OF PROCEDURE:  DATE OF DISCHARGE:                                    STRESS TEST   ROUTINE TREADMILL TEST   INDICATION:  Mr. Efferson is a 55 year old male, with known noncritical  coronary artery disease by cardiac catheterization in April 2005, with  subsequent negative stress test, now referred for evaluation of atypical  chest pain.   TEST:  The patient exercises a total of 11 minutes 15 seconds of a standard  Bruce protocol.  Test is discontinued secondary to fatigue.   The patient reported no chest pain throughout stress testing.   Heart rate rose from 49 to 148 maximum (88% PMHR; 12.8 METS).  Blood  pressure rose from 130/72 to 210/68 maximum.   Serial EKG tracings notable for insignificant upsloping ST depression  inferolaterally.  No dysrhythmias noted.   CONCLUSION:  Negative adequate routine treadmill test; perfusion images  pending.      GS/MEDQ  D:  08/12/2004  T:  08/12/2004  Job:  [id number]

## 2010-09-26 NOTE — Op Note (Signed)
   NAME:  LAFE, CLERK                         ACCOUNT NO.:  [id number]   MEDICAL RECORD NO.:  [id number]                   PATIENT TYPE:  OUT   LOCATION:  [facility]                                 FACILITY:  [facility]   PHYSICIAN:  Edward L. Juanetta Gosling, M.D.             DATE OF BIRTH:  [birth year]   DATE OF PROCEDURE:  DATE OF DISCHARGE:                                  PROCEDURE NOTE   INDICATIONS FOR PROCEDURE:  The patient had an episode of chest discomfort.  He is undergoing graded exercise testing to rule out ischemic cardiac  disease.  He does have a history of hypertension.  There are no  contraindications to graded exercise testing.   The patient exercised for 10 minutes and 30 seconds on the Bruce protocol  regimen, sustaining 12.9 minutes.  His maximum recorded heart rate was 157  which is 92% of his age-predicated maximum heart rate.  He had no symptoms  during exercise, no electrocardiographic changes suggestive of inducible  ischemia.  He had normal blood pressure response to exercise.   IMPRESSION:  1. Good exercise tolerance.  2. No evidence of inducible ischemia.  3. No symptoms during exercise.  4. No evidence of reducible ischemia.                                               Edward L. Juanetta Gosling, M.D.    ELH/MEDQ  D:  07/06/2002  T:  07/06/2002  Job:  [id number]

## 2010-09-26 NOTE — Cardiovascular Report (Signed)
NAME:  Jonathan Dyer, Jonathan Dyer                           ACCOUNT NO.:  [id number]   MEDICAL RECORD NO.:  [id number]                   PATIENT TYPE:   LOCATION:                                       FACILITY:   PHYSICIAN:  Charlies Constable, M.D. [facility]              DATE OF BIRTH:   DATE OF PROCEDURE:  08/07/2003  DATE OF DISCHARGE:                              CARDIAC CATHETERIZATION   CLINICAL HISTORY:  Mr. Herminio Commons is 55 years old and clinically works as a  [employer].  He has had no prior history of known heart disease.  About a  week ago he developed chest pain while out on a canoe, which was vague and  lasted much of the day.  He was seen in [facility] emergency room and  subsequently had a CT scan to rule out pulmonary embolus, which was  negative, but this showed calcification in his coronary arteries and so he  had a Cardiolite scan which suggested inferior ischemia.  He was seen in  consultation by Suszanne Conners. Julious Oka., and Jonelle Sidle, M.D., at the  [facility], and arrangements were made for him to be evaluated with  coronary angiography.   PROCEDURE:  The procedure was performed via the right femoral artery using  an arterial sheath and 6 French preformed coronary catheters.  A front wall  arterial puncture was performed and Omnipaque contrast was used.  Four  French catheters were used.  The patient tolerated the procedure well and  left the laboratory in satisfactory condition.   RESULTS:  1. Left main coronary artery:  The left main coronary artery was free of     significant disease.  2. Left anterior descending:  The left anterior descending artery gave rise     to three septal perforators and a diagonal branch.  There was 40%     narrowing in the proximal LAD, and there was 80% narrowing in the very     distal tip of the LAD with diffuse distal disease.  There was 80%     stenosis in a small diagonal branch.  3. Circumflex artery:  The circumflex artery gives rise  to a small ramus     branch, a large marginal branch which had three sub-branches, and an AV     branch which terminated in a posterolateral branch.  These also were free     of significant disease.  4. Right coronary artery:  The right coronary artery was a moderate-sized     vessel that gave rise to a conus branch, two right ventricular branches,     a posterior descending branch, and three small posterolateral branches.     The right coronary artery was irregular, and there was 30% narrowing in     the proximal portion.  5. Left ventriculogram:  The left ventriculogram performed in the RAO     projection showed good wall  motion with no evidence of hypokinesis.  The     estimated ejection fraction was 50%.  6. The aortic pressure was 104/61 with a mean of 79.  Left ventricular     pressure was 104/9.   CONCLUSION:  Coronary artery disease with 40% narrowing in the proximal left  anterior descending artery, 80% narrowing in the very distal portion of the  left anterior descending artery, 80% narrowing in a small diagonal branch of  the left anterior descending artery, no major obstruction in the circumflex  artery, 30% narrowing in the proximal right coronary artery with  irregularities in the mid- and distal right coronary artery, and normal left  ventricular function.   RECOMMENDATIONS:  The patient has primarily nonobstructive and small-vessel  disease.  I am not certain if his recent symptoms were ischemic or not.  They were somewhat atypical.  Will plan medical treatment and secondary risk  factor modification.  I will arrange follow-up with Dr. Diona Browner.                                               Charlies Constable, M.D. [facility]    BB/MEDQ  D:  08/07/2003  T:  08/08/2003  Job:  [id number]   cc:   Ramon Dredge L. Juanetta Gosling, M.D.  [address]  [place]  [place] [address]  Fax: [phone]   Jonelle Sidle, M.D. [facility]   Cardiopulmonary Lab   Vida Roller, M.D.  Fax: [phone]

## 2010-09-26 NOTE — Consult Note (Signed)
NAME:  [name], [name]               ACCOUNT NO.:  [id number]   MEDICAL RECORD NO.:  [id number]          PATIENT TYPE:  AMB   LOCATION:  DAY                           FACILITY:  [facility]   PHYSICIAN:  Kassie Mends, M.D.      DATE OF BIRTH:  1956/02/05   DATE OF CONSULTATION:  04/29/2006  DATE OF DISCHARGE:                                 CONSULTATION   REASON FOR CONSULTATION:  Blood in stool.   HISTORY OF PRESENT ILLNESS:  Jonathan Dyer is a 55 year old gentleman who  recently developed acute onset diarrhea and severe abdominal cramping.  His symptoms began on Friday, December 14.  He nearly passed out with  the pain.  He was concerned that he may be having angina but as the  diarrhea progressed, he went on to the emergency department for  evaluation.  He did have several watery stools with blood.  Upon  evaluation he had a white count of 13,000.  His hemoglobin was 15.1,  platelets 217,000, lipase 23, LFTs normal.  His stool culture was  negative.  He had a persistent diarrhea with blood and saw Dr. Juanetta Gosling  the following Monday.  At this point he has been two days without any  rectal bleeding.  His stools are starting to form up.  He feels quite  wiped out.  He has abdominal soreness but no further cramping.  No  nausea or vomiting, fever or chills.   CURRENT MEDICATIONS:  1. Cipro 500 mg prescribed b.i.d. but he is taking only one daily. He      has been doing this for five days, given to him in the ED.  2. Altace 10 mg b.i.d.  3. Vytorin 10/40 mg daily.  4. Aspirin 325 mg daily.  5. Fish oil 1000 mg two daily.  6. Vitamin B complex daily.  7. Vitamin E 400 international units daily.  8. CoQ-10 daily.   ALLERGIES:  PENICILLIN AND MYCINS.   PAST MEDICAL HISTORY:  1. Hypertension.  2. Hypercholesterolemia.  3. History of coronary artery disease status post cardiac      catheterization.  4. He has had four left knee surgeries with reconstruction over 10      years ago.  5.  Tonsillectomy.   FAMILY HISTORY:  Mother and father are both deceased in their 51s with  MI.  No family history of colorectal cancer.   SOCIAL HISTORY:  Divorced. Has three children.  He is employed with  [employer].  He is a nonsmoker.  No alcohol use.   REVIEW OF SYSTEMS:  See HPI for GI.  CONSTITUTIONAL:  No weight loss.  CARDIOPULMONARY:  No chest pain or shortness of breath.   PHYSICAL EXAMINATION:  VITAL SIGNS:  Weight 249.5.  Height 6 feet 2  inches.  Temperature 97.9, blood pressure 142/82, pulse 60.  GENERAL:  Pleasant, well-nourished, well-developed, Caucasian male in no  acute distress.  SKIN:  Warm and dry.  No jaundice.  HEENT:  Sclerae nonicteric.  Oropharyngeal mucosa moist and pink.  No  lesions, erythema or exudates.  NECK:  No lymphadenopathy, thyromegaly.  CHEST:  Lungs are clear to auscultation.  CARDIAC:  Regular rate and rhythm.  Normal S1 and S2 with no murmurs,  rubs, or gallops.  ABDOMEN:  Positive bowel sounds.  Soft, nondistended.  He has vague mild  tenderness throughout the abdomen.  No organomegaly or masses.  No  rebound tenderness or guarding.  No abdominal bruits or hernias.  EXTREMITIES:  No edema.   IMPRESSION:  Jonathan Dyer is a 55 year old gentleman with what sounds like  recent viral gastroenteritis which he is gradually recovering from.  He  did have some associated hematochezia with no clear etiology.  It could  be due to benign anorectal source in the setting of diarrhea.  He has  never had a colonoscopy.  He needs to have one for diagnostic purposes  at this time.   PLAN:  1. Colonoscopy in the near future.  2. Suspect the patient has viral illness given his stool culture was      negative and he is clinically improved.  Would recommend he      discontinue Cipro.      Tana Coast, P.A.      Kassie Mends, M.D.  Electronically Signed    LL/MEDQ  D:  04/29/2006  T:  04/30/2006  Job:  [id number]   cc:   Ramon Dredge L. Juanetta Gosling, M.D.  Fax:  [phone]

## 2010-09-26 NOTE — Procedures (Signed)
NAME:  Jonathan Dyer, Jonathan Dyer               ACCOUNT NO.:  [id number]   MEDICAL RECORD NO.:  [id number]          PATIENT TYPE:  OUT   LOCATION:  RAD                           FACILITY:  [facility]   PHYSICIAN:  Edward L. Juanetta Gosling, M.D.DATE OF BIRTH:  1956-02-05   DATE OF PROCEDURE:  03/28/2004  DATE OF DISCHARGE:                                    STRESS TEST   INDICATIONS FOR PROCEDURE:  [name] is undergoing exercise testing as a  check on previous heart disease to get released for work and also because he  has been having some twinges of chest discomfort.  He does have a known  history of coronary artery occlusive disease.  There are no  contraindications to exercise testing with Cardiolite.   [name] exercised for 11 minutes and 35 seconds on the Bruce protocol,  reaching and sustaining 12.8 mets.  His maximum recorded heart rate was 152,  which is 89% of his age-predicted maximum heart rate.  Cardiolite was  injected per protocol because he had what he describes as a sensation that  his heart was going to be out of rhythm or skip, but he did not have any  chest pain.  There were no electrocardiographic changes suggestive of  inducible ischemia.  He was mildly hypertensive at rest and had normal blood  pressure response to exercise.   IMPRESSION:  1.  Excellent exercise tolerance.  2.  No evidence of inducible ischemia.  3.  Slightly hypertensive at rest with normal blood pressure response to      exercise.  4.  Symptoms of a sensation that his heart was going to skip, but no actual      arrhythmias were noted.  5.  Cardiolite images are pending.     Edwa   ELH/MEDQ  D:  03/28/2004  T:  03/28/2004  Job:  [id number]   cc:   Jonelle Sidle, M.D. [facility]

## 2011-02-03 ENCOUNTER — Encounter: Payer: Self-pay | Admitting: Cardiology

## 2011-02-04 ENCOUNTER — Ambulatory Visit: Payer: Worker's Compensation | Admitting: Cardiology

## 2011-02-18 LAB — POCT I-STAT 4, (NA,K, GLUC, HGB,HCT)
Hemoglobin: 17
Potassium: 4.1
Sodium: 141

## 2011-03-11 ENCOUNTER — Other Ambulatory Visit: Payer: Self-pay | Admitting: Cardiology

## 2011-03-23 ENCOUNTER — Ambulatory Visit: Payer: Worker's Compensation | Admitting: Cardiology

## 2011-03-23 ENCOUNTER — Encounter: Payer: Self-pay | Admitting: Cardiology

## 2011-03-24 ENCOUNTER — Ambulatory Visit: Payer: Worker's Compensation | Admitting: Cardiology

## 2011-04-28 ENCOUNTER — Encounter: Payer: Self-pay | Admitting: Gastroenterology

## 2011-05-11 ENCOUNTER — Other Ambulatory Visit: Payer: Self-pay | Admitting: Cardiology

## 2011-05-11 NOTE — Telephone Encounter (Signed)
[place] pt. 

## 2011-06-13 ENCOUNTER — Other Ambulatory Visit: Payer: Self-pay | Admitting: Cardiology

## 2011-06-24 ENCOUNTER — Other Ambulatory Visit: Payer: Self-pay | Admitting: *Deleted

## 2011-06-24 MED ORDER — FLECAINIDE ACETATE 50 MG PO TABS: 50.0000 mg | ORAL_TABLET | Freq: Two times a day (BID) | ORAL | Status: DC

## 2011-06-24 NOTE — Telephone Encounter (Signed)
Patient currently without health insurance but is expecting medicare starting in April and will come for an appointment at that time. Appointment scheduled for April 23rd.

## 2011-08-21 ENCOUNTER — Other Ambulatory Visit: Payer: Self-pay | Admitting: Cardiology

## 2011-08-21 ENCOUNTER — Telehealth: Payer: Self-pay | Admitting: *Deleted

## 2011-08-21 ENCOUNTER — Ambulatory Visit (INDEPENDENT_AMBULATORY_CARE_PROVIDER_SITE_OTHER): Payer: Medicare Other | Admitting: Cardiology

## 2011-08-21 ENCOUNTER — Encounter: Payer: Self-pay | Admitting: *Deleted

## 2011-08-21 ENCOUNTER — Encounter: Payer: Self-pay | Admitting: Cardiology

## 2011-08-21 VITALS — BP 133/85 | HR 66 | Ht 75.0 in | Wt 253.0 lb

## 2011-08-21 DIAGNOSIS — R079 Chest pain, unspecified: Secondary | ICD-10-CM

## 2011-08-21 DIAGNOSIS — I4891 Unspecified atrial fibrillation: Secondary | ICD-10-CM

## 2011-08-21 DIAGNOSIS — I2510 Atherosclerotic heart disease of native coronary artery without angina pectoris: Secondary | ICD-10-CM

## 2011-08-21 DIAGNOSIS — I10 Essential (primary) hypertension: Secondary | ICD-10-CM

## 2011-08-21 DIAGNOSIS — E782 Mixed hyperlipidemia: Secondary | ICD-10-CM

## 2011-08-21 NOTE — Assessment & Plan Note (Signed)
On Pravachol, followed by Dr. Juanetta Gosling.

## 2011-08-21 NOTE — Progress Notes (Signed)
   Clinical Summary Mr. Humm is a 56 y.o.male presenting for followup. He was seen in March of last year. He did not followup for last scheduled visit related to lack of insurance. He presents today describing some recent episodes of chest pain, one that occurred when he was cycling. He reports no definite palpitations with these symptoms, no syncope. He has been compliant with his medications, reports no major prolonged palpitations.  Last ischemic workup was in 2011. He has known CAD, nonobstructive by prior assessment.   Allergies  Allergen Reactions  . Mycinettes   . Penicillins     Current Outpatient Prescriptions  Medication Sig Dispense Refill  . amLODipine-benazepril (LOTREL) 5-20 MG per capsule Take 1 capsule by mouth Daily.      [name] aspirin 325 MG tablet Take 325 mg by mouth daily.        . Coenzyme Q10-Vitamin E 100-300 MG-UNIT WAFR Take 1 tablet by mouth daily.      . flecainide (TAMBOCOR) 50 MG tablet Take 1 tablet (50 mg total) by mouth 2 (two) times daily.  60 tablet  2  . ibuprofen (ADVIL,MOTRIN) 200 MG tablet Take 200 mg by mouth every 6 (six) hours as needed.      [name] KRILL OIL PO Take 2 capsules by mouth daily.      . nitroGLYCERIN (NITROSTAT) 0.4 MG SL tablet Place 0.4 mg under the tongue every 5 (five) minutes as needed.        . pravastatin (PRAVACHOL) 40 MG tablet Take 1 tablet by mouth Daily.        Past Medical History  Diagnosis Date  . OSA (obstructive sleep apnea)   . Atrial fibrillation     Paroxysmal  . Coronary atherosclerosis of native coronary artery     Nonobstructive, LVEF normal  . Essential hypertension, benign     Social History Mr. Schreckengost reports that he has never smoked. He has never used smokeless tobacco. Mr. Rivere reports that he does not drink alcohol.  Review of Systems Negative except as outlined above.  Physical Examination Filed Vitals:   08/21/11 1355  BP: 133/85  Pulse: 66    Overweight male in no acute distress.    HEENT: Conjunctiva and lids normal, oral pharynx clear.  Neck: Supple, no carotid bruits or elevated JVP.  Lungs: Clear to auscultation, nonlabored.  Cardiac: Regular rate and rhythm, no S3 gallop.  Abdomen: Soft, nontender, bowel sounds present.  Extremities: No pitting edema, distal pulses full.  Skin: Warm and dry.  Musculoskeletal: No kyphosis.  Neuropsychiatric: Alert and oriented x3, affect appropriate.   ECG Sinus rhythm at 64 with QRS 118 ms.   Problem List and Plan

## 2011-08-21 NOTE — Patient Instructions (Signed)
   Exercise Echo  If the results of your test are normal or stable, you will receive a letter.  If they are abnormal, the nurse will contact you by phone. Your physician wants you to follow up in: 6 months.  You will receive a reminder letter in the mail one-two months in advance.  If you don't receive a letter, please call our office to schedule the follow up appointment

## 2011-08-21 NOTE — Assessment & Plan Note (Signed)
Blood pressure is mildly elevated today. No changes made. He states that he has not been exercising regularly over the winter.

## 2011-08-21 NOTE — Telephone Encounter (Signed)
Exercise Echo scheduled for 08-28-2011 @ [facility]

## 2011-08-21 NOTE — Assessment & Plan Note (Signed)
No history of obstructive disease. He has had some intermittent chest pain symptoms, and needs followup ischemic testing, particularly in light of plan to continue flecainide. An exercise echocardiogram will be obtained.

## 2011-08-21 NOTE — Assessment & Plan Note (Signed)
Seems to be reasonably well controlled on current regimen including Flecainide. QRS duration is relatively stable since last year. He has not been anticoagulated based on our discussions over time.

## 2011-08-25 NOTE — Telephone Encounter (Signed)
Pt has [employer]ARP MCR Complete.  AOZH#[id number] expires 10-09-11

## 2011-08-28 DIAGNOSIS — R072 Precordial pain: Secondary | ICD-10-CM

## 2011-09-01 ENCOUNTER — Ambulatory Visit: Payer: Worker's Compensation | Admitting: Cardiology

## 2011-09-01 ENCOUNTER — Encounter: Payer: Self-pay | Admitting: *Deleted

## 2011-09-21 ENCOUNTER — Other Ambulatory Visit: Payer: Self-pay | Admitting: *Deleted

## 2011-09-21 MED ORDER — FLECAINIDE ACETATE 50 MG PO TABS: 50.0000 mg | ORAL_TABLET | Freq: Two times a day (BID) | ORAL | Status: DC

## 2011-10-14 ENCOUNTER — Ambulatory Visit (HOSPITAL_COMMUNITY)
Admission: RE | Admit: 2011-10-14 | Discharge: 2011-10-14 | Disposition: A | Discharge status: Home / Self Care | Payer: Medicare Other | Source: Ambulatory Visit | Attending: Specialist | Admitting: Specialist

## 2011-10-14 ENCOUNTER — Other Ambulatory Visit (HOSPITAL_COMMUNITY): Payer: Self-pay | Admitting: Specialist

## 2011-10-14 DIAGNOSIS — Z139 Encounter for screening, unspecified: Secondary | ICD-10-CM | POA: Insufficient documentation

## 2011-12-02 ENCOUNTER — Telehealth: Payer: Self-pay | Admitting: *Deleted

## 2011-12-02 NOTE — Telephone Encounter (Signed)
MD from [facility] company called wanting to speak with MD r/e a disability claim as 09/09/2011. Nurse requested to have questions faxed over and MD stated that he would rather speak with MD instead.

## 2011-12-02 NOTE — Telephone Encounter (Signed)
I can speak with him if he calls back during office hours, when I am in the office and have the chart available.

## 2011-12-03 NOTE — Telephone Encounter (Signed)
MD spoke with doctor at [facility] this morning.

## 2012-01-26 ENCOUNTER — Ambulatory Visit (HOSPITAL_COMMUNITY)
Admission: RE | Admit: 2012-01-26 | Discharge: 2012-01-26 | Disposition: A | Discharge status: Home / Self Care | Payer: Medicare Other | Source: Ambulatory Visit | Attending: Specialist | Admitting: Specialist

## 2012-01-26 DIAGNOSIS — I10 Essential (primary) hypertension: Secondary | ICD-10-CM | POA: Insufficient documentation

## 2012-01-26 DIAGNOSIS — M25619 Stiffness of unspecified shoulder, not elsewhere classified: Secondary | ICD-10-CM | POA: Insufficient documentation

## 2012-01-26 DIAGNOSIS — M6281 Muscle weakness (generalized): Secondary | ICD-10-CM | POA: Insufficient documentation

## 2012-01-26 DIAGNOSIS — M7542 Impingement syndrome of left shoulder: Secondary | ICD-10-CM | POA: Insufficient documentation

## 2012-01-26 DIAGNOSIS — M25519 Pain in unspecified shoulder: Secondary | ICD-10-CM | POA: Insufficient documentation

## 2012-01-26 DIAGNOSIS — IMO0001 Reserved for inherently not codable concepts without codable children: Secondary | ICD-10-CM | POA: Insufficient documentation

## 2012-01-26 NOTE — Evaluation (Signed)
Occupational Therapy Evaluation  Patient Details  Name: Jonathan Dyer MRN: [id number] Date of Birth: 09-24-1955  Today's Date: 01/26/2012 Time: 0810-0900 OT Time Calculation (min): 50 min OT Evaluation 810-835 25' Manual Therapy 835-845 10' Heat 10' Visit#: 1  of 24   Re-eval: 02/23/12  Assessment Diagnosis: Left Scope RCR, SAD, DCE Surgical Date: 01/19/12 Next MD Visit: one month Prior Therapy: n/a  Authorization: [employer]  Authorization Time Period: before 10th visit  Authorization Visit#: 1  of 10    Past Medical History:  Past Medical History  Diagnosis Date  . OSA (obstructive sleep apnea)   . 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 Coronary atherosclerosis of native coronary artery     Nonobstructive, LVEF normal  . Essential hypertension, benign    Past Surgical History:  Past Surgical History  Procedure Date  . Tonsillectomy   . Left anterior cruciate ligament reconstruction   . Right shoulder surgery   . Knee arthroscopy   . Total knee arthroplasty     Subjective  S:  I had surgery last Tuesday on my left shoulder. Pertinent History: Jonathan Dyer has had chronic pain and decreased mobility in his right shoulder last Tuesday for DCE, SAD, RCR.  He has been referred to occupational therapy for evaluation and treatment following his RCR protocol of 4 weeks PROM through 02/16/12, AAROM through 03/01/12. Limitations:  RCR protocol of 4 weeks PROM through 02/16/12, AAROM through 03/01/12. Special Tests: UEFI 36/80 = 45%. Patient Stated Goals: I want to be able to use my arm again. Pain Assessment Currently in Pain?: Yes Pain Score:   3 Pain Location: Shoulder Pain Orientation: Left Pain Type: Acute pain  Precautions/Restrictions  Precautions Precautions: Shoulder (PROM x 4 weeks, AAROM x 2 weeks)  Prior Functioning  Home Living Lives With: Alone Prior Function Level of Independence: Independent with basic ADLs;Independent  with homemaking with ambulation Driving: Yes Vocation: Retired Leisure: Hobbies-yes (Comment) Comments: enjoys tinkering in the yard and bicycling  Assessment ADL/Vision/Perception ADL ADL Comments: Unable to use his left upper extremity to complete BADLs and IADLs Dominant Hand: Right  Cognition/Observation Cognition Overall Cognitive Status: Appears within functional limits for tasks assessed Observation/Other Assessments Observations: 5 scope incisions on left shoulder  Sensation/Coordination/Edema Sensation Light Touch: Appears Intact Coordination Gross Motor Movements are Fluid and Coordinated: Yes Fine Motor Movements are Fluid and Coordinated: Yes Edema Edema: WFL  Additional Assessments RUE PROM (degrees) RUE Overall PROM Comments: assessed in supine in ER/IR in adducted  Right Shoulder Flexion: 65 Degrees Right Shoulder ABduction: 90 Degrees Right Shoulder Internal Rotation: 70 Degrees Right Shoulder External Rotation: 10 Degrees Palpation Palpation: moderate fascial restrictions in left shoulder region     Exercise/Treatments    Modalities Modalities: Moist Heat Manual Therapy Manual Therapy: Myofascial release Myofascial Release: MFR and manual stretching to left upper arm, scapular, trapezius, and shoulder region to decrease pain and restrictions and increase pain free PROM.  [phone]  Moist Heat Therapy Number Minutes Moist Heat: 10 Minutes Moist Heat Location: Shoulder  Occupational Therapy Assessment and Plan OT Assessment and Plan Clinical Impression Statement: 56 year old s/p left RCR and scope presents with decreased AROM and strength and increased pain and restrictions causing decreased I with B/IADLs and leisure activities.  Pt will benefit from skilled therapeutic intervention in order to improve on the following deficits: Decreased strength;Decreased range of motion;Pain;Impaired UE functional use;Increased muscle spasms;Increased fascial  restricitons Rehab Potential: Good OT Frequency: Min 2X/week OT  Duration: 12 weeks OT Treatment/Interventions: Self-care/ADL training;Therapeutic exercise;Modalities;Therapeutic activities;Patient/family education OT Plan: P:  Skilled OT intervention is indicated to increase AROM/PROM and strength and decrease pain and restrictions in order to use LUE with all functional activities.  Treatment Plan:  Follow Protocol   supine MFR and PROM, isometrics and bridges.  seated elevation, extension, rows.  elbow flexion/ext, sup/ron, wrist flex/ext, ball stretches.     Goals Short Term Goals Time to Complete Short Term Goals: 6 weeks Short Term Goal 1: Patient will be educated on a HEP. Short Term Goal 2: Patient will increase PROM to [facility] for increased ability to comb his hair. Short Term Goal 3: Patient will increase LUE strength to 3+/5 for increased ability to lift bags of groceries. Short Term Goal 4: Patient will decrease pain to 1/10 in his left shoulder when completing daily activities. Short Term Goal 5: Patient will decrease fascial restrictions to minimal in his left shoulder region.   Long Term Goals Time to Complete Long Term Goals: 12 weeks Long Term Goal 1: Patient will return to prior level of I with all B/IADLs and leisure activities. Long Term Goal 2: Patient will increase left shoulder AROM to WNL for increased ability to reach into overhead cabinets. Long Term Goal 3: Patient will increase left shoulder strength to 5/5 for increased ability to lift tools and equipment when working in his yard. Long Term Goal 4: Patient will decrease left shoulder pain to 0/10 when completing daily activities. Long Term Goal 5: Patient will decrease fascial restrictions to trace in his left shoulder region.   Problem List Patient Active Problem List  Diagnosis  . HYPERLIPIDEMIA, MIXED  . OBSTRUCTIVE SLEEP APNEA  . Essential hypertension, benign  . CAD, NATIVE VESSEL  . PAROXYSMAL ATRIAL  FIBRILLATION  . BRADYCARDIA  . CHEST PAIN UNSPECIFIED  . Pain in joint, shoulder region  . Impingement syndrome of left shoulder  . Muscle weakness (generalized)    End of Session Activity Tolerance: Patient tolerated treatment well General Behavior During Session: [facility] for tasks performed Cognition: [facility] for tasks performed OT Plan of Care OT Home Exercise Plan: Educated on towel slides and pendulum exercises.    GO Functional Assessment Tool Used: UEFI administered.  scored 36/80 =45% I level, 55% disability level Functional Limitation: Carrying, moving and handling objects Carrying, Moving and Handling Objects Current Status [phone]): At least 40 percent but less than 60 percent impaired, limited or restricted Carrying, Moving and Handling Objects Goal Status [phone]): At least 1 percent but less than 20 percent impaired, limited or restricted  Shirlean Mylar, OTR/L  01/26/2012, 10:46 AM  Physician Documentation Your signature is required to indicate approval of the treatment plan as stated above.  Please sign and either send electronically or make a copy of this report for your files and return this physician signed original.  Please mark one 1.__approve of plan  2. ___approve of plan with the following conditions.   ______________________________                                                          _____________________ Physician Signature  Date  

## 2012-01-28 ENCOUNTER — Ambulatory Visit (HOSPITAL_COMMUNITY)
Admission: RE | Admit: 2012-01-28 | Discharge: 2012-01-28 | Disposition: A | Discharge status: Home / Self Care | Payer: Medicare Other | Source: Ambulatory Visit | Attending: Specialist | Admitting: Specialist

## 2012-01-28 DIAGNOSIS — M25519 Pain in unspecified shoulder: Secondary | ICD-10-CM

## 2012-01-28 DIAGNOSIS — M7542 Impingement syndrome of left shoulder: Secondary | ICD-10-CM

## 2012-01-28 DIAGNOSIS — M6281 Muscle weakness (generalized): Secondary | ICD-10-CM
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
## 2012-02-03 ENCOUNTER — Ambulatory Visit (HOSPITAL_COMMUNITY)
Admission: RE | Admit: 2012-02-03 | Discharge: 2012-02-03 | Disposition: A | Discharge status: Home / Self Care | Payer: Medicare Other | Source: Ambulatory Visit | Attending: Pulmonary Disease | Admitting: Pulmonary Disease

## 2012-02-03 DIAGNOSIS — M25519 Pain in unspecified shoulder: Secondary | ICD-10-CM

## 2012-02-03 DIAGNOSIS — M6281 Muscle weakness (generalized): Secondary | ICD-10-CM

## 2012-02-03 DIAGNOSIS — M7542 Impingement syndrome of left shoulder: Secondary | ICD-10-CM

## 2012-02-03 NOTE — Progress Notes (Signed)
Occupational Therapy Treatment Patient Details  Name: Jonathan Dyer MRN: [id number] Date of Birth: Apr 29, 1956  Today's Date: 02/03/2012 Time: 2956-2130 OT Time Calculation (min): 31 min Manual Therapy: 31'  Visit#: 3  of 24   Re-eval: 02/23/12    Authorization: [employer]   Authorization Time Period: before 10th visit  Authorization Visit#: 3  of 10   Subjective Symptoms/Limitations Symptoms: S: I am in more pain today than I was last time I was here. I may have done something; picked up a bag of dog food and felt my shoulder burn and now its been hurting.  Pain Assessment Currently in Pain?: Yes Pain Score:   3 Pain Location: Shoulder Pain Orientation: Left Pain Type: Acute pain  Precautions/Restrictions   Shoulder  Exercise/Treatments       Manual Therapy Manual Therapy: Myofascial release Myofascial Release: MFR and manual stretching to left upper arm, scapular, trapezius, and shoulder region to decrease pain and restrictions and increase pain free PROM  Occupational Therapy Assessment and Plan OT Assessment and Plan Clinical Impression Statement: A: Pt had very limited PROM. Pts shoulder was catching at 50%. Pt stated he may have hurt himself due to picking up a bag of dog food. He stated he felt a burning pain and  his shoulder hurts worse now than before picking up the  bag.  Treatment session was stopped due to caution. Suggested pt call his Dr. regarding pain.  Rehab Potential: Good OT Plan: P: Increase PROM to [facility] in all shoulder ranges. Resume exercises that were not done this date. 9.25.13.   Goals Short Term Goals Time to Complete Short Term Goals: 6 weeks Short Term Goal 1: Patient will be educated on a HEP. Short Term Goal 2: Patient will increase PROM to [facility] for increased ability to comb his hair. Short Term Goal 3: Patient will increase LUE strength to 3+/5 for increased ability to lift bags of groceries. Short Term Goal 4: Patient will  decrease pain to 1/10 in his left shoulder when completing daily activities. Short Term Goal 5: Patient will decrease fascial restrictions to minimal in his left shoulder region.   Long Term Goals Time to Complete Long Term Goals: 12 weeks Long Term Goal 1: Patient will return to prior level of I with all B/IADLs and leisure activities. Long Term Goal 2: Patient will increase left shoulder AROM to WNL for increased ability to reach into overhead cabinets. Long Term Goal 3: Patient will increase left shoulder strength to 5/5 for increased ability to lift tools and equipment when working in his yard. Long Term Goal 4: Patient will decrease left shoulder pain to 0/10 when completing daily activities. Long Term Goal 5: Patient will decrease fascial restrictions to trace in his left shoulder region.   Problem List Patient Active Problem List  Diagnosis  . HYPERLIPIDEMIA, MIXED  . OBSTRUCTIVE SLEEP APNEA  . Essential hypertension, benign  . CAD, NATIVE VESSEL  . PAROXYSMAL ATRIAL FIBRILLATION  . BRADYCARDIA  . CHEST PAIN UNSPECIFIED  . Pain in joint, shoulder region  . Impingement syndrome of left shoulder  . Muscle weakness (generalized)    End of Session Activity Tolerance: Patient limited by pain General Behavior During Session: Other (comment) Cognition: WFL for tasks performed  GO   Judi Saa, OTAS  02/03/2012, 3:12 PM

## 2012-02-03 NOTE — Progress Notes (Signed)
Note reviewed by clinical instructor and accurately reflects treatment session.  Krista Som L. Jamaira Sherk, COTA/L  

## 2012-02-05 ENCOUNTER — Ambulatory Visit (HOSPITAL_COMMUNITY): Payer: Medicare Other | Admitting: Occupational Therapy

## 2012-02-09 ENCOUNTER — Ambulatory Visit (HOSPITAL_COMMUNITY)
Admission: RE | Admit: 2012-02-09 | Discharge: 2012-02-09 | Disposition: A | Discharge status: Home / Self Care | Payer: Medicare Other | Source: Ambulatory Visit | Attending: Specialist | Admitting: Specialist

## 2012-02-09 DIAGNOSIS — M25619 Stiffness of unspecified shoulder, not elsewhere classified: Secondary | ICD-10-CM | POA: Insufficient documentation

## 2012-02-09 DIAGNOSIS — M6281 Muscle weakness (generalized): Secondary | ICD-10-CM | POA: Insufficient documentation

## 2012-02-09 DIAGNOSIS — I10 Essential (primary) hypertension: Secondary | ICD-10-CM | POA: Insufficient documentation

## 2012-02-09 DIAGNOSIS — M25519 Pain in unspecified shoulder: Secondary | ICD-10-CM | POA: Insufficient documentation

## 2012-02-09 DIAGNOSIS — IMO0001 Reserved for inherently not codable concepts without codable children: Secondary | ICD-10-CM | POA: Insufficient documentation

## 2012-02-09 NOTE — Progress Notes (Signed)
Occupational Therapy Treatment Patient Details  Name: Jonathan Dyer MRN: [id number] Date of Birth: [birth year]  Today's Date: 02/09/2012 Time: 4696-2952 OT Time Calculation (min): 57 min Manual Therapy [phone] 25' Therapeutic Exercises 1007-1020 13' Heat 1020-1030 10' Visit#: 4  of 24   Re-eval: 02/23/12 (reassess 02/11/12 for MD visit)    Authorization: [employer]   Authorization Time Period: before 10th visit   Authorization Visit#: 4  of 10   Subjective S:  I contacted the MD about hurting my arm when I lifted the dog food.  The nurse said it was ok to continue therapy.  I also came close to loosing my balance yesterday and tensed my left shoulder.  It hurt pretty bad when I did it, but it is ok now.   Limitations: RCR protocol of 4 weeks PROM through 02/16/12, AAROM through 03/01/12 Pain Assessment Currently in Pain?: Yes Pain Score:   3 Pain Location: Shoulder Pain Orientation: Left Pain Type: Acute pain  Precautions/Restrictions   RCR protocol of 4 weeks PROM through 02/16/12, AAROM through 03/01/12   Exercise/Treatments Supine Protraction: PROM;10 reps Horizontal ABduction: PROM;10 reps External Rotation: PROM;10 reps Internal Rotation: PROM;10 reps Flexion: PROM;10 reps ABduction: PROM;10 reps Seated Elevation: AROM;15 reps Extension: AROM;15 reps Row: AROM;15 reps Other Seated Exercises: elbow flexion, extension, supination, pronation, wrist flexion, extension x 15 reps Therapy Ball Flexion: 20 reps ABduction: 20 reps       Manual Therapy Manual Therapy: Myofascial release Myofascial Release: MFR and manual stretching to left upper arm, scapular, trapezius, and shoulder region to decrease pain and restrictions and increase pain free PROM [phone] Moist Heat Therapy Number Minutes Moist Heat: 10 Minutes Moist Heat Location: Shoulder  Occupational Therapy Assessment and Plan OT Assessment and Plan Clinical Impression Statement: A:   Increased to 60% PROM in flexion and abduction in supine.  Significant fascial restrictions in his scapular region this date.   OT Plan: P:  Increase PROM to 75% through all ranges.  PROM to continue through 02/16/12, per protocol.  Reassess for MD appointment.   Goals Short Term Goals Time to Complete Short Term Goals: 6 weeks Short Term Goal 1: Patient will be educated on a HEP. Short Term Goal 1 Progress: Progressing toward goal Short Term Goal 2: Patient will increase PROM to [facility] for increased ability to comb his hair. Short Term Goal 2 Progress: Progressing toward goal Short Term Goal 3: Patient will increase LUE strength to 3+/5 for increased ability to lift bags of groceries. Short Term Goal 3 Progress: Progressing toward goal Short Term Goal 4: Patient will decrease pain to 1/10 in his left shoulder when completing daily activities. Short Term Goal 4 Progress: Progressing toward goal Short Term Goal 5: Patient will decrease fascial restrictions to minimal in his left shoulder region.   Short Term Goal 5 Progress: Progressing toward goal Long Term Goals Time to Complete Long Term Goals: 12 weeks Long Term Goal 1: Patient will return to prior level of I with all B/IADLs and leisure activities. Long Term Goal 1 Progress: Progressing toward goal Long Term Goal 2: Patient will increase left shoulder AROM to WNL for increased ability to reach into overhead cabinets. Long Term Goal 2 Progress: Progressing toward goal Long Term Goal 3: Patient will increase left shoulder strength to 5/5 for increased ability to lift tools and equipment when working in his yard. Long Term Goal 3 Progress: Progressing toward goal Long Term Goal 4: Patient will decrease left shoulder pain to  0/10 when completing daily activities. Long Term Goal 4 Progress: Progressing toward goal Long Term Goal 5: Patient will decrease fascial restrictions to trace in his left shoulder region.  Long Term Goal 5 Progress:  Progressing toward goal  Problem List Patient Active Problem List  Diagnosis  . HYPERLIPIDEMIA, MIXED  . OBSTRUCTIVE SLEEP APNEA  . Essential hypertension, benign  . CAD, NATIVE VESSEL  . PAROXYSMAL ATRIAL FIBRILLATION  . BRADYCARDIA  . CHEST PAIN UNSPECIFIED  . Pain in joint, shoulder region  . Impingement syndrome of left shoulder  . Muscle weakness (generalized)    End of Session Activity Tolerance: Patient tolerated treatment well General Behavior During Session: [facility] for tasks performed Cognition: [facility] for tasks performed  GO    Shirlean Mylar, OTR/L  02/09/2012, 10:23 AM

## 2012-02-11 ENCOUNTER — Ambulatory Visit (HOSPITAL_COMMUNITY)
Admission: RE | Admit: 2012-02-11 | Discharge: 2012-02-11 | Disposition: A | Discharge status: Home / Self Care | Payer: Medicare Other | Source: Ambulatory Visit | Attending: Specialist | Admitting: Specialist

## 2012-02-11 DIAGNOSIS — M6281 Muscle weakness (generalized): Secondary | ICD-10-CM

## 2012-02-11 DIAGNOSIS — M7542 Impingement syndrome of left shoulder: Secondary | ICD-10-CM

## 2012-02-11 DIAGNOSIS — M25519 Pain in unspecified shoulder: Secondary | ICD-10-CM

## 2012-02-11 NOTE — Progress Notes (Signed)
Occupational Therapy Treatment Patient Details  Name: Jonathan Dyer MRN: [id number] Date of Birth: [birth year]  Today's Date: 02/11/2012 Time: 6644-0347 OT Time Calculation (min): 51 min Manual Therapy [phone] 18' Therapeutic Exercises [phone] 22' Heat 10' Visit#: 5  of 24   Re-eval: 03/10/12    Authorization: [employer]   Authorization Time Period: before 15th visit  Authorization Visit#: 5  of 15   Subjective S:  I behaved yesterday, I havent done anything silly with my arm. Special Tests: UEFI was 45% currently 40/80 = 50% Pain Assessment Currently in Pain?: Yes Pain Score:   1 Pain Location: Shoulder Pain Orientation: Left Pain Type: Acute pain  Precautions/Restrictions   PROM through 02/16/12  Exercise/Treatments Supine Protraction: PROM;10 reps Horizontal ABduction: PROM;10 reps External Rotation: PROM;10 reps Internal Rotation: PROM;10 reps Flexion: PROM;10 reps ABduction: PROM;10 reps Other Supine Exercises: bridges x 25 Seated Elevation: AROM;15 reps Extension: AROM;15 reps Row: AROM;15 reps Other Seated Exercises: elbow flexion, extension, supination, pronation, wrist flexion, extension x 15 reps Therapy Ball Flexion: 25 reps ABduction: 25 reps ROM / Strengthening / Isometric Strengthening   Flexion: Supine;5X5" Extension: Supine;5X5" External Rotation: Supine;5X5" Internal Rotation: Supine;5X5" ABduction: Supine;5X5" ADduction: Supine;5X5"      Modalities Modalities: Moist Heat Manual Therapy Manual Therapy: Myofascial release Myofascial Release: MFR and manual stretching to left upper arm, scapular, trapezius, and shoulder region to decrease pain and restrictions and increase pain free PROM [phone] Moist Heat Therapy Number Minutes Moist Heat: 10 Minutes Moist Heat Location: Shoulder  Occupational Therapy Assessment and Plan OT Assessment and Plan Clinical Impression Statement: A:  Please see md progress note for PROM  assessment.  Significant improvement in PROM and decrease in pain this date. OT Frequency: Min 2X/week OT Duration: 4 weeks OT Plan: P:  Continue 2 x week x 4 weeks for increased PROM and AAROM.  AAROM will begin on 02/16/12.  Increase isometric contraction time to 10" x 3 reps.   Goals Short Term Goals Time to Complete Short Term Goals: 6 weeks Short Term Goal 1: Patient will be educated on a HEP. Short Term Goal 1 Progress: Progressing toward goal Short Term Goal 2: Patient will increase PROM to [facility] for increased ability to comb his hair. Short Term Goal 2 Progress: Progressing toward goal Short Term Goal 3: Patient will increase LUE strength to 3+/5 for increased ability to lift bags of groceries. Short Term Goal 3 Progress: Progressing toward goal Short Term Goal 4: Patient will decrease pain to 1/10 in his left shoulder when completing daily activities. Short Term Goal 4 Progress: Progressing toward goal Short Term Goal 5: Patient will decrease fascial restrictions to minimal in his left shoulder region.   Short Term Goal 5 Progress: Progressing toward goal Long Term Goals Time to Complete Long Term Goals: 12 weeks Long Term Goal 1: Patient will return to prior level of I with all B/IADLs and leisure activities. Long Term Goal 1 Progress: Progressing toward goal Long Term Goal 2: Patient will increase left shoulder AROM to WNL for increased ability to reach into overhead cabinets. Long Term Goal 2 Progress: Progressing toward goal Long Term Goal 3: Patient will increase left shoulder strength to 5/5 for increased ability to lift tools and equipment when working in his yard. Long Term Goal 3 Progress: Progressing toward goal Long Term Goal 4: Patient will decrease left shoulder pain to 0/10 when completing daily activities. Long Term Goal 4 Progress: Progressing toward goal Long Term Goal 5: Patient will  decrease fascial restrictions to trace in his left shoulder region.  Long Term  Goal 5 Progress: Progressing toward goal  Problem List Patient Active Problem List  Diagnosis  . HYPERLIPIDEMIA, MIXED  . OBSTRUCTIVE SLEEP APNEA  . Essential hypertension, benign  . CAD, NATIVE VESSEL  . PAROXYSMAL ATRIAL FIBRILLATION  . BRADYCARDIA  . CHEST PAIN UNSPECIFIED  . Pain in joint, shoulder region  . Impingement syndrome of left shoulder  . Muscle weakness (generalized)    End of Session Activity Tolerance: Patient tolerated treatment well General Behavior During Session: [facility] for tasks performed Cognition: [facility] for tasks performed  GO Functional Assessment Tool Used: UEFI scored 40/80 = 50% impairment level Functional Limitation: Carrying, moving and handling objects Carrying, Moving and Handling Objects Current Status ([id number]): At least 40 percent but less than 60 percent impaired, limited or restricted Carrying, Moving and Handling Objects Goal Status [phone]): At least 1 percent but less than 20 percent impaired, limited or restricted  Shirlean Mylar, OTR/L  02/11/2012, 10:48 AM

## 2012-02-16 ENCOUNTER — Ambulatory Visit (HOSPITAL_COMMUNITY): Payer: Medicare Other | Admitting: Occupational Therapy

## 2012-02-18 ENCOUNTER — Ambulatory Visit (HOSPITAL_COMMUNITY)
Admission: RE | Admit: 2012-02-18 | Discharge: 2012-02-18 | Disposition: A | Discharge status: Home / Self Care | Payer: Medicare Other | Source: Ambulatory Visit | Attending: Specialist | Admitting: Specialist

## 2012-02-18 DIAGNOSIS — M25519 Pain in unspecified shoulder: Secondary | ICD-10-CM

## 2012-02-18 DIAGNOSIS — M6281 Muscle weakness (generalized): Secondary | ICD-10-CM

## 2012-02-18 DIAGNOSIS — M7542 Impingement syndrome of left shoulder: Secondary | ICD-10-CM

## 2012-02-18 NOTE — Progress Notes (Signed)
Occupational Therapy Treatment Patient Details  Name: Jonathan Dyer MRN: [id number] Date of Birth: 11-14-1955  Today's Date: 02/18/2012 Time: 0932-1020 OT Time Calculation (min): 48 min Manual Therapy [phone] 20' Therapeutic Exercises [phone] 28' Visit#: 6  of 24   Re-eval: 03/10/12    Authorization: [employer]   Authorization Time Period: before 15th visit   Authorization Visit#: 6  of 15   Subjective S:  I went to the MD and he was quite pleased. Limitations: RCR Protocol AAROM 10/8-10/22 then progress to AROM Pain Assessment Currently in Pain?: No/denies  Precautions/Restrictions   Limitations: RCR Protocol AAROM 10/8-10/22 then progress to AROM  Exercise/Treatments Supine Protraction: PROM;AAROM;10 reps Horizontal ABduction: PROM;AAROM;10 reps External Rotation: PROM;AAROM;10 reps Internal Rotation: PROM;AAROM;10 reps Flexion: PROM;AAROM;10 reps ABduction: PROM;AAROM;10 reps Other Supine Exercises: bridges x 25 Seated Elevation: AROM;15 reps Extension: AROM;15 reps Row: AROM;15 reps Other Seated Exercises: elbow flexion, extension, supination, pronation, wrist flexion, extension x 15 reps with 1# Therapy Ball Flexion: 25 reps ABduction: 25 reps ROM / Strengthening / Isometric Strengthening Prot/Ret//Elev/Dep: 1' Flexion: Theraband;3X3" Theraband Level (Flexion): Level 2 (Red) Extension: Theraband;3X3" Theraband Level (Extension): Level 2 (Red) External Rotation: Theraband;3X3" Theraband Level (External Rotation): Level 2 (Red) Internal Rotation: Theraband;3X3" Theraband Level (Internal Rotation): Level 2 (Red) ABduction: Theraband;3X3" Theraband Level (ABduction): Level 2 (Red) ADduction: Theraband;3X3" Theraband Level (ADduction): Level 2 (Red)    Manual Therapy Manual Therapy: Myofascial release Myofascial Release: MFR and manual stretching to left upper arm, scapular, trapezius, and shoulder region to decrease pain and restrictions and  increase pain free [id number]  Occupational Therapy Assessment and Plan OT Assessment and Plan Clinical Impression Statement: A:  Began AAROM this date in supine with good range demonstrated.  flexion and ext limited to 60%. OT Plan: P:  Reiterate importance of following protocol.  Increase reps with dowel rod exercises and with isometrics with tband.    Goals Short Term Goals Time to Complete Short Term Goals: 6 weeks Short Term Goal 1: Patient will be educated on a HEP. Short Term Goal 1 Progress: Progressing toward goal Short Term Goal 2: Patient will increase PROM to [facility] for increased ability to comb his hair. Short Term Goal 2 Progress: Progressing toward goal Short Term Goal 3: Patient will increase LUE strength to 3+/5 for increased ability to lift bags of groceries. Short Term Goal 3 Progress: Progressing toward goal Short Term Goal 4: Patient will decrease pain to 1/10 in his left shoulder when completing daily activities. Short Term Goal 4 Progress: Progressing toward goal Short Term Goal 5: Patient will decrease fascial restrictions to minimal in his left shoulder region.   Short Term Goal 5 Progress: Progressing toward goal Long Term Goals Time to Complete Long Term Goals: 12 weeks Long Term Goal 1: Patient will return to prior level of I with all B/IADLs and leisure activities. Long Term Goal 1 Progress: Progressing toward goal Long Term Goal 2: Patient will increase left shoulder AROM to WNL for increased ability to reach into overhead cabinets. Long Term Goal 2 Progress: Progressing toward goal Long Term Goal 3: Patient will increase left shoulder strength to 5/5 for increased ability to lift tools and equipment when working in his yard. Long Term Goal 3 Progress: Progressing toward goal Long Term Goal 4: Patient will decrease left shoulder pain to 0/10 when completing daily activities. Long Term Goal 4 Progress: Progressing toward goal Long Term Goal 5: Patient will  decrease fascial restrictions to trace in his left shoulder region.  Long Term Goal 5 Progress: Progressing toward goal  Problem List Patient Active Problem List  Diagnosis  . HYPERLIPIDEMIA, MIXED  . OBSTRUCTIVE SLEEP APNEA  . Essential hypertension, benign  . CAD, NATIVE VESSEL  . PAROXYSMAL ATRIAL FIBRILLATION  . BRADYCARDIA  . CHEST PAIN UNSPECIFIED  . Pain in joint, shoulder region  . Impingement syndrome of left shoulder  . Muscle weakness (generalized)    End of Session Activity Tolerance: Patient tolerated treatment well General Behavior During Session: [facility] for tasks performed Cognition: [facility] for tasks performed  GO    Shirlean Mylar, OTR/L  02/18/2012, 10:19 AM

## 2012-02-23 ENCOUNTER — Ambulatory Visit (HOSPITAL_COMMUNITY)
Admission: RE | Admit: 2012-02-23 | Discharge: 2012-02-23 | Disposition: A | Discharge status: Home / Self Care | Payer: Medicare Other | Source: Ambulatory Visit | Attending: Specialist | Admitting: Specialist

## 2012-02-23 DIAGNOSIS — M6281 Muscle weakness (generalized): Secondary | ICD-10-CM

## 2012-02-23 DIAGNOSIS — M25519 Pain in unspecified shoulder: Secondary | ICD-10-CM

## 2012-02-23 DIAGNOSIS — M7542 Impingement syndrome of left shoulder: Secondary | ICD-10-CM

## 2012-02-23 NOTE — Progress Notes (Signed)
Occupational Therapy Treatment Patient Details  Name: TASHON CAPP MRN: [id number] Date of Birth: 1955-07-14  Today's Date: 02/23/2012 Time: 0926-1021 OT Time Calculation (min): 55 min Manual Therapy [phone] 28' Therapeutic Exercises [phone] 23' Visit#: 7  of 24   Re-eval: 03/10/12    Authorization: [employer]   Authorization Time Period: before 15th visit   Authorization Visit#: 7  of 15   Subjective  S:  I went to spin class the other day, and one way I put my hand really bothered my shoulder.  - Recommended not doing spin class unless he wore his sling, in order to follow his protocol. Limitations: RCR Protocol AAROM 10/8-10/22 then progress to AROM  Pain Assessment Currently in Pain?: Yes Pain Score:   2 Pain Location: Shoulder Pain Orientation: Left Pain Type: Acute pain  Precautions/Restrictions   RCR Protocol AAROM 10/8-10/22 then progress to AROM   Exercise/Treatments Supine Protraction: PROM;10 reps;AAROM;12 reps Horizontal ABduction: PROM;10 reps;AAROM;12 reps External Rotation: PROM;10 reps;AAROM;12 reps Internal Rotation: PROM;10 reps;AAROM;12 reps Flexion: PROM;10 reps;AAROM;12 reps ABduction: PROM;10 reps;AAROM;12 reps Other Supine Exercises: dc Seated Elevation: AROM;15 reps Extension: 10 reps;Theraband Theraband Level (Shoulder Extension): Level 2 (Red) Row: Theraband;10 reps Theraband Level (Shoulder Row): Level 2 (Red) Protraction: AAROM;10 reps External Rotation: AAROM;10 reps Internal Rotation: 10 reps;AAROM Flexion: AAROM;10 reps Other Seated Exercises: elbow flexion, extension, supination, pronation, wrist flexion, extension x 15 reps with 2# Pulleys Flexion: 2 minutes ABduction: 2 minutes Therapy Ball Flexion: 25 reps ABduction: 25 reps ROM / Strengthening / Isometric Strengthening Prot/Ret//Elev/Dep: 1' Flexion:  (dc all isometric strengthening)    Manual Therapy Myofascial Release: MFR and manual stretching to left  upper arm, scapular, trapezius, and shoulder region to decrease pain and restrictions and increase pain free PROM [phone]   Occupational Therapy Assessment and Plan OT Assessment and Plan Clinical Impression Statement: A:  Added AAROM in seated for flexion, ext rot, int rot, and prot.  Pain with PROM of flexion but able to range a bit further each visit.   OT Plan: P:  Increase dowel rod reps and increase time with pulleys.     Goals Short Term Goals Time to Complete Short Term Goals: 6 weeks Short Term Goal 1: Patient will be educated on a HEP. Short Term Goal 1 Progress: Progressing toward goal Short Term Goal 2: Patient will increase PROM to [facility] for increased ability to comb his hair. Short Term Goal 2 Progress: Progressing toward goal Short Term Goal 3: Patient will increase LUE strength to 3+/5 for increased ability to lift bags of groceries. Short Term Goal 3 Progress: Progressing toward goal Short Term Goal 4: Patient will decrease pain to 1/10 in his left shoulder when completing daily activities. Short Term Goal 4 Progress: Progressing toward goal Short Term Goal 5: Patient will decrease fascial restrictions to minimal in his left shoulder region.   Short Term Goal 5 Progress: Progressing toward goal Long Term Goals Time to Complete Long Term Goals: 12 weeks Long Term Goal 1: Patient will return to prior level of I with all B/IADLs and leisure activities. Long Term Goal 1 Progress: Progressing toward goal Long Term Goal 2: Patient will increase left shoulder AROM to WNL for increased ability to reach into overhead cabinets. Long Term Goal 2 Progress: Progressing toward goal Long Term Goal 3: Patient will increase left shoulder strength to 5/5 for increased ability to lift tools and equipment when working in his yard. Long Term Goal 3 Progress: Progressing toward goal Long  Term Goal 4: Patient will decrease left shoulder pain to 0/10 when completing daily activities. Long Term  Goal 4 Progress: Progressing toward goal Long Term Goal 5: Patient will decrease fascial restrictions to trace in his left shoulder region.  Long Term Goal 5 Progress: Progressing toward goal  Problem List Patient Active Problem List  Diagnosis  . HYPERLIPIDEMIA, MIXED  . OBSTRUCTIVE SLEEP APNEA  . Essential hypertension, benign  . CAD, NATIVE VESSEL  . PAROXYSMAL ATRIAL FIBRILLATION  . BRADYCARDIA  . CHEST PAIN UNSPECIFIED  . Pain in joint, shoulder region  . Impingement syndrome of left shoulder  . Muscle weakness (generalized)    End of Session Activity Tolerance: Patient tolerated treatment well General Behavior During Session: [facility] for tasks performed Cognition: [facility] for tasks performed  GO    Shirlean Mylar, OTR/L  02/23/2012, 10:23 AM

## 2012-02-25 ENCOUNTER — Ambulatory Visit (HOSPITAL_COMMUNITY): Payer: Medicare Other | Admitting: Occupational Therapy

## 2012-03-01 ENCOUNTER — Ambulatory Visit (HOSPITAL_COMMUNITY): Payer: Medicare Other | Admitting: Occupational Therapy

## 2012-03-03 ENCOUNTER — Ambulatory Visit (HOSPITAL_COMMUNITY): Payer: Medicare Other | Admitting: Occupational Therapy

## 2012-03-15 ENCOUNTER — Ambulatory Visit (HOSPITAL_COMMUNITY): Payer: Medicare Other | Admitting: Specialist

## 2012-04-11 ENCOUNTER — Other Ambulatory Visit: Payer: Self-pay | Admitting: Cardiology

## 2012-04-11 MED ORDER — FLECAINIDE ACETATE 50 MG PO TABS: 50.0000 mg | ORAL_TABLET | Freq: Two times a day (BID) | ORAL | Status: DC

## 2012-06-15 ENCOUNTER — Ambulatory Visit (INDEPENDENT_AMBULATORY_CARE_PROVIDER_SITE_OTHER): Payer: Medicare Other | Admitting: Cardiology

## 2012-06-15 ENCOUNTER — Encounter: Payer: Self-pay | Admitting: Cardiology

## 2012-06-15 VITALS — BP 124/86 | HR 52 | Ht 75.0 in | Wt 253.0 lb

## 2012-06-15 DIAGNOSIS — I10 Essential (primary) hypertension: Secondary | ICD-10-CM

## 2012-06-15 DIAGNOSIS — I2510 Atherosclerotic heart disease of native coronary artery without angina pectoris: Secondary | ICD-10-CM

## 2012-06-15 DIAGNOSIS — I4891 Unspecified atrial fibrillation: Secondary | ICD-10-CM

## 2012-06-15 MED ORDER — FLECAINIDE ACETATE 50 MG PO TABS: 50.0000 mg | ORAL_TABLET | Freq: Two times a day (BID) | ORAL | Status: DC

## 2012-06-15 NOTE — Patient Instructions (Addendum)

## 2012-06-15 NOTE — Assessment & Plan Note (Signed)
Well controlled on flecainide. Will continue aspirin for now with relatively low thromboembolic risk score, and our discussions over time. Followup arranged.

## 2012-06-15 NOTE — Assessment & Plan Note (Signed)
Reasonably well controlled today. Keep followup with Dr. Juanetta Gosling.

## 2012-06-15 NOTE — Progress Notes (Signed)
   Clinical Summary Jonathan Dyer is a 57 y.o.male presenting for followup. He was seen in April 2013. He has had orthopedic surgeries involving his knees and shoulders in the interim. Trying to get back into his exercise plan, has been going to the [facility], also working on his diet. He plans to start back cycling outdoors in the spring.  He reports no palpitations of any long duration. No obvious breakthrough atrial fibrillation has been documented. ECG today shows sinus bradycardia with stable QRS 114 ms.  Followup exercise echocardiogram in April 2013 was negative for ischemia.   Allergies  Allergen Reactions  . Mycinettes   . Penicillins     Current Outpatient Prescriptions  Medication Sig Dispense Refill  . amLODipine (NORVASC) 10 MG tablet Take 1 tablet by mouth Daily.      [name] aspirin 325 MG tablet Take 325 mg by mouth daily.        . flecainide (TAMBOCOR) 50 MG tablet Take 1 tablet (50 mg total) by mouth 2 (two) times daily.  180 tablet  3  . ibuprofen (ADVIL,MOTRIN) 200 MG tablet Take 200 mg by mouth every 6 (six) hours as needed.      [name] KRILL OIL PO Take 2 capsules by mouth daily.      [name] losartan (COZAAR) 100 MG tablet Take 1 tablet by mouth Daily.      . Multiple Vitamin (MULTIVITAMIN) tablet Take 1 tablet by mouth daily.      . nitroGLYCERIN (NITROSTAT) 0.4 MG SL tablet Place 0.4 mg under the tongue every 5 (five) minutes as needed.        . pravastatin (PRAVACHOL) 40 MG tablet Take 1 tablet by mouth Daily.      . Coenzyme Q10-Vitamin E 100-300 MG-UNIT WAFR Take 1 tablet by mouth daily.        Past Medical History  Diagnosis Date  . OSA (obstructive sleep apnea)   . Atrial fibrillation     Paroxysmal  . Coronary atherosclerosis of native coronary artery     Nonobstructive, LVEF normal  . Essential hypertension, benign     Social History Jonathan Dyer reports that he has never smoked. He has never used smokeless tobacco. Jonathan Dyer reports that he does not drink  alcohol.  Review of Systems Negative except as outlined.  Physical Examination Filed Vitals:   06/15/12 0809  BP: 124/86  Pulse: 52   Filed Weights   06/15/12 0809  Weight: 253 lb (114.76 kg)   Overweight male in no acute distress.  HEENT: Conjunctiva and lids normal, oral pharynx clear.  Neck: Supple, no carotid bruits or elevated JVP.  Lungs: Clear to auscultation, nonlabored.  Cardiac: Regular rate and rhythm, no S3 gallop.  Abdomen: Soft, nontender, bowel sounds present.  Extremities: No pitting edema, distal pulses full.    Problem List and Plan   PAROXYSMAL ATRIAL FIBRILLATION Well controlled on flecainide. Will continue aspirin for now with relatively low thromboembolic risk score, and our discussions over time. Followup arranged.  CAD, NATIVE VESSEL History of nonobstructive disease at previous catheterization, negative followup exercise echocardiogram from last year.  Essential hypertension, benign Reasonably well controlled today. Keep followup with Dr. Juanetta Gosling.    Jonelle Sidle, M.D., F.A.C.C.

## 2012-06-15 NOTE — Assessment & Plan Note (Signed)
History of nonobstructive disease at previous catheterization, negative followup exercise echocardiogram from last year.

## 2012-10-07 ENCOUNTER — Observation Stay (HOSPITAL_COMMUNITY)
Admission: EM | Admit: 2012-10-07 | Discharge: 2012-10-08 | Disposition: A | Discharge status: Home / Self Care | Payer: Medicare Other | Attending: Internal Medicine | Admitting: Internal Medicine

## 2012-10-07 ENCOUNTER — Encounter (HOSPITAL_COMMUNITY): Payer: Self-pay | Admitting: *Deleted

## 2012-10-07 ENCOUNTER — Emergency Department (HOSPITAL_COMMUNITY): Payer: Medicare Other

## 2012-10-07 DIAGNOSIS — R079 Chest pain, unspecified: Principal | ICD-10-CM

## 2012-10-07 DIAGNOSIS — I4891 Unspecified atrial fibrillation: Secondary | ICD-10-CM

## 2012-10-07 DIAGNOSIS — Z79899 Other long term (current) drug therapy: Secondary | ICD-10-CM | POA: Insufficient documentation

## 2012-10-07 DIAGNOSIS — I10 Essential (primary) hypertension: Secondary | ICD-10-CM

## 2012-10-07 DIAGNOSIS — I2510 Atherosclerotic heart disease of native coronary artery without angina pectoris: Secondary | ICD-10-CM

## 2012-10-07 HISTORY — DX: Sleep apnea, unspecified: G47.30

## 2012-10-07 HISTORY — DX: Bradycardia, unspecified: R00.1

## 2012-10-07 LAB — BASIC METABOLIC PANEL
CO2: 25 mEq/L (ref 19–32)
Chloride: 102 mEq/L (ref 96–112)
Creatinine, Ser: 1.24 mg/dL (ref 0.50–1.35)
Glucose, Bld: 115 mg/dL — ABNORMAL HIGH (ref 70–99)

## 2012-10-07 LAB — CBC WITH DIFFERENTIAL/PLATELET
Eosinophils Relative: 3 % (ref 0–5)
HCT: 42.2 % (ref 39.0–52.0)
Lymphocytes Relative: 25 % (ref 12–46)
Lymphs Abs: 1.6 10*3/uL (ref 0.7–4.0)
MCH: 30.7 pg (ref 26.0–34.0)
MCV: 87 fL (ref 78.0–100.0)
Monocytes Absolute: 0.4 10*3/uL (ref 0.1–1.0)
RBC: 4.85 MIL/uL (ref 4.22–5.81)
WBC: 6.4 10*3/uL (ref 4.0–10.5)

## 2012-10-07 LAB — CBC
Hemoglobin: 14.7 g/dL (ref 13.0–17.0)
MCH: 30.6 pg (ref 26.0–34.0)
MCHC: 35.3 g/dL (ref 30.0–36.0)
MCV: 86.9 fL (ref 78.0–100.0)
Platelets: 230 10*3/uL (ref 150–400)
RBC: 4.8 MIL/uL (ref 4.22–5.81)

## 2012-10-07 LAB — CREATININE, SERUM: Creatinine, Ser: 1.25 mg/dL (ref 0.50–1.35)

## 2012-10-07 MED ORDER — NITROGLYCERIN 0.4 MG SL SUBL: 0.4000 mg | SUBLINGUAL_TABLET | SUBLINGUAL | Status: DC | PRN

## 2012-10-07 MED ORDER — ASPIRIN 81 MG PO CHEW
324.0000 mg | CHEWABLE_TABLET | ORAL | Status: DC
  Filled 2012-10-07: qty 4

## 2012-10-07 MED ORDER — ONE-DAILY MULTI VITAMINS PO TABS
1.0000 | ORAL_TABLET | Freq: Every day | ORAL | Status: DC
  Administered 2012-10-08: 1 via ORAL
  Filled 2012-10-07: qty 1

## 2012-10-07 MED ORDER — SODIUM CHLORIDE 0.9 % IJ SOLN
3.0000 mL | Freq: Two times a day (BID) | INTRAMUSCULAR | Status: DC
  Administered 2012-10-07 – 2012-10-08 (×2): 3 mL via INTRAVENOUS

## 2012-10-07 MED ORDER — LOSARTAN POTASSIUM 50 MG PO TABS
100.0000 mg | ORAL_TABLET | Freq: Every day | ORAL | Status: DC
  Filled 2012-10-07: qty 2

## 2012-10-07 MED ORDER — ATORVASTATIN CALCIUM 40 MG PO TABS
40.0000 mg | ORAL_TABLET | Freq: Every day | ORAL | Status: DC
  Filled 2012-10-07 (×2): qty 1

## 2012-10-07 MED ORDER — ASPIRIN 300 MG RE SUPP
300.0000 mg | RECTAL | Status: DC
  Filled 2012-10-07: qty 1

## 2012-10-07 MED ORDER — ASPIRIN EC 81 MG PO TBEC
81.0000 mg | DELAYED_RELEASE_TABLET | Freq: Every day | ORAL | Status: DC
  Administered 2012-10-08: 81 mg via ORAL
  Filled 2012-10-07: qty 1

## 2012-10-07 MED ORDER — ENOXAPARIN SODIUM 60 MG/0.6ML ~~LOC~~ SOLN
0.5000 mg/kg | SUBCUTANEOUS | Status: DC
  Filled 2012-10-07 (×2): qty 0.6

## 2012-10-07 MED ORDER — B COMPLEX-C PO TABS
1.0000 | ORAL_TABLET | Freq: Every day | ORAL | Status: DC
  Administered 2012-10-08: 1 via ORAL
  Filled 2012-10-07: qty 1

## 2012-10-07 MED ORDER — AMLODIPINE BESYLATE 10 MG PO TABS
10.0000 mg | ORAL_TABLET | Freq: Every day | ORAL | Status: DC
  Filled 2012-10-07: qty 1

## 2012-10-07 MED ORDER — ACETAMINOPHEN 325 MG PO TABS: 650.0000 mg | ORAL_TABLET | ORAL | Status: DC | PRN

## 2012-10-07 MED ORDER — ONDANSETRON HCL 4 MG/2ML IJ SOLN: 4.0000 mg | Freq: Four times a day (QID) | INTRAMUSCULAR | Status: DC | PRN

## 2012-10-07 MED ORDER — SODIUM CHLORIDE 0.9 % IV SOLN: 250.0000 mL | INTRAVENOUS | Status: DC | PRN

## 2012-10-07 MED ORDER — PANTOPRAZOLE SODIUM 40 MG PO TBEC
40.0000 mg | DELAYED_RELEASE_TABLET | Freq: Every day | ORAL | Status: DC
  Administered 2012-10-07 – 2012-10-08 (×2): 40 mg via ORAL
  Filled 2012-10-07: qty 1

## 2012-10-07 MED ORDER — SODIUM CHLORIDE 0.9 % IJ SOLN: 3.0000 mL | INTRAMUSCULAR | Status: DC | PRN

## 2012-10-07 MED ORDER — FLECAINIDE ACETATE 50 MG PO TABS
50.0000 mg | ORAL_TABLET | Freq: Two times a day (BID) | ORAL | Status: DC
  Administered 2012-10-07: 50 mg via ORAL
  Filled 2012-10-07 (×3): qty 1

## 2012-10-07 NOTE — H&P (Addendum)
[name] MRN: [id number], DOB/AGE: [birth year]   Admit date: 10/07/2012 Date of Consult: @TODAY @  Primary Physician: Fredirick Maudlin, MD Primary Cardiologist: Diona Browner    Problem List: Past Medical History  Diagnosis Date  . OSA (obstructive sleep apnea)   . Atrial fibrillation     Paroxysmal  . Coronary atherosclerosis of native coronary artery     Nonobstructive, LVEF normal  . Essential hypertension, benign     Past Surgical History  Procedure Laterality Date  . Tonsillectomy    . Left anterior cruciate ligament reconstruction    . Right shoulder surgery    . Knee arthroscopy    . Total knee arthroplasty       Allergies:  Allergies  Allergen Reactions  . Mycinette (Phenol)   . Penicillins     HPI: Patient is a 57 yo with a history of CAD, HTN, PAF, OSA who presented to [facility] with complaints of CP The patient notes episodes of CP that occurred earlier this week   Short lived lasting seconds  Occurred with and without acitvity  No associated SOB    Resolved on own  He biked long distance on [address] and other places this week.  Again, intermittent pain.  No SOB  No change in his ability to do this. Today he was on long bike ride  Had CP before he even started the ride.  Was 2 to 3/10 in intensity  Constant.  Not exacerbated by the bike riding.   No SOB  He turned around to come home earlier than normal because he thought it was going to rain  Because he noticed the discomfort he thought he would let Dr Juanetta Gosling know.  He went to his office.  Discomfort unchanged  No radiation  No SOB  No change with getting off the bike  Sent to ER at [facility].  Went away on its own.   Patient denies reflux. No nausea  No change in appetite.  No fatigue.    Patient had cardiac catheterizaion in 2005.  This showed:  40%prox LAD; 80% distal (tip) LAD; 80% diag (small);  L Cx without signif CAD; RCA dominant.  30% prox lesion.  LVEF 50%.  Plan for medical Rx.  He does  note a history of L chest/shoulder pain in the past  Not associated with activity  He underwent a stress echo for this a couple years ago at [facility]  He reports that it was negatve.  Patient has been maintained on flecanide for afib.  Denies palpitations.  Last seen in cardiology clinic in Feb 2014.  Inpatient Medications:   Family History  Problem Relation Age of Onset  . Pulmonary embolism Mother   . Lung disease Father   . Hyperlipidemia Brother   . Hypertension Brother   . Diabetes Other     Significant family h/o DM     History   Social History  . Marital Status: Divorced    Spouse Name: N/A    Number of Children: N/A  . Years of Education: N/A   Occupational History  . Not on file.   Social History Main Topics  . Smoking status: Never Smoker   . Smokeless tobacco: Never Used  . Alcohol Use: No  . Drug Use: No  . Sexually Active: Not on file   Other Topics Concern  . Not on file   Social History Narrative  . No narrative on file     Review  of Systems: All other systems reviewed and are otherwise negative except as noted above.  Physical Exam: Filed Vitals:   10/07/12 1514  BP: 130/72  Pulse: 53  Temp:   Resp: 16   No intake or output data in the 24 hours ending 10/07/12 1556  General: Well developed, well nourished, in no acute distress. Head: Normocephalic, atraumatic, sclera non-icteric Neck: Negative for carotid bruits. JVP not elevated. Lungs: Clear bilaterally to auscultation without wheezes, rales, or rhonchi. Breathing is unlabored. Heart: RRR with S1 S2. No murmurs, rubs, or gallops appreciated. Abdomen: Soft, non-tender, non-distended with normoactive bowel sounds. No hepatomegaly. No rebound/guarding. No obvious abdominal masses. Msk:  Strength and tone appears normal for age. Extremities: No clubbing, cyanosis or edema.  Distal pedal pulses are 2+ and equal bilaterally. Neuro: Alert and oriented X 3. Moves all extremities  spontaneously. Psych:  Responds to questions appropriately with a normal affect.  Labs: Results for orders placed during the hospital encounter of 10/07/12 (from the past 24 hour(s))  BASIC METABOLIC PANEL     Status: Abnormal   Collection Time    10/07/12 11:54 AM      Result Value Range   Sodium 139  135 - 145 mEq/L   Potassium 3.8  3.5 - 5.1 mEq/L   Chloride 102  96 - 112 mEq/L   CO2 25  19 - 32 mEq/L   Glucose, Bld 115 (*) 70 - 99 mg/dL   BUN 18  6 - 23 mg/dL   Creatinine, Ser 5.28  0.50 - 1.35 mg/dL   Calcium 9.3  8.4 - 41.3 mg/dL   GFR calc non Af Amer 62 (*) >90 mL/min   GFR calc Af Amer 72 (*) >90 mL/min  CBC WITH DIFFERENTIAL     Status: None   Collection Time    10/07/12 11:54 AM      Result Value Range   WBC 6.4  4.0 - 10.5 K/uL   RBC 4.85  4.22 - 5.81 MIL/uL   Hemoglobin 14.9  13.0 - 17.0 g/dL   HCT 24.4  01.0 - 27.2 %   MCV 87.0  78.0 - 100.0 fL   MCH 30.7  26.0 - 34.0 pg   MCHC 35.3  30.0 - 36.0 g/dL   RDW 53.6  64.4 - 03.4 %   Platelets 229  150 - 400 K/uL   Neutrophils Relative % 65  43 - 77 %   Neutro Abs 4.2  1.7 - 7.7 K/uL   Lymphocytes Relative 25  12 - 46 %   Lymphs Abs 1.6  0.7 - 4.0 K/uL   Monocytes Relative 6  3 - 12 %   Monocytes Absolute 0.4  0.1 - 1.0 K/uL   Eosinophils Relative 3  0 - 5 %   Eosinophils Absolute 0.2  0.0 - 0.7 K/uL   Basophils Relative 0  0 - 1 %   Basophils Absolute 0.0  0.0 - 0.1 K/uL  TROPONIN I     Status: None   Collection Time    10/07/12 11:54 AM      Result Value Range   Troponin I <0.30  <0.30 ng/mL    Radiology/Studies: Dg Chest Port 1 View  10/07/2012   *RADIOLOGY REPORT*  Clinical Data: Chest pain.  Current history of atrial fibrillation.  PORTABLE CHEST - 1 VIEW 10/07/2012 1209 hours:  Comparison: CTA chest 09/10/2009.  Two-view chest x-ray 03/20/2009.  Findings: Cardiac silhouette upper normal in size, unchanged, allowing for differences in  technique.  Thoracic aorta mildly tortuous, unchanged.  Hilar and  mediastinal contours otherwise unremarkable.  Lungs clear.  Bronchovascular markings normal. Pulmonary vascularity normal.  No pneumothorax.  No pleural effusions.  IMPRESSION: No acute cardiopulmonary disease.   Original Report Authenticated By: Hulan Saas, M.D.    EKG:  SR  Incomplete RBBB.  T wave inversion AVL.  ASSESSMENT AND PLAN:  1.  CP  Very atypical for angina.  He should not have been able to bike like he has this past week and he should have noticed problems doing it with worsening symptoms  I am not convinced it is cardiac in origin I would admit and r/o for MI given history of CAD  I would start protonix for GI irritation. If he rules out I would consider early stress echo as an outpatient   If positive cath.     2.  CAD  As noted above  3.  HL  Patient admits to stopping statin for about 6 months  "I was tired of taking all those meds"  Labs done at Dr Juanetta Gosling office recently total chol (?) was 220.  He has restarted pravastatin.  4  Afib  Keep on flecanide for now.  Not on AV nodal blocking agent probably due to low heart rates.  On review of the record he was treated for afib at [facility] 2011 with Flecanide 300 mg x1.  Coreg stopped  Sent home on cardiazem 240 and flecanide 50 bid.  Holter after showed no afib  He has been on ASA only.  Denies palpitations now.    Continue telemetry  Continue for now.  Keep on ASA for now.  Patient is a long distance biker.   5.  HTN  Continue meds.   Signed, Dietrich Pates 10/07/2012, 3:56 PM

## 2012-10-07 NOTE — Progress Notes (Signed)
Pt arrived to 2041, no admission orders; Card Master paged; will await callback.

## 2012-10-07 NOTE — Progress Notes (Signed)
Dr. Tenny Craw at bedside; will cont. To monitor; pt denies CP or SOB at this time.

## 2012-10-07 NOTE — ED Notes (Signed)
Sent from Dr Juanetta Gosling office for eval of chest pain. Has been having pain intermittent for 1 week.

## 2012-10-07 NOTE — ED Provider Notes (Signed)
History    This chart was scribed for Donnetta Hutching, MD by Quintella Reichert, ED scribe.  This patient was seen in room [facility] and the patient's care was started at 12:09 PM.   CSN: [id number]  Arrival date & time 10/07/12  1134      Chief Complaint  Patient presents with  . Chest Pain     The history is provided by the patient. No language interpreter was used.    HPI Comments: Jonathan Dyer is a 57 y.o. male who presents to the Emergency Department complaining of mild, intermittent, central CP that began 1 week ago and became more persistent today while he was riding his bike.  Pt describes pain as squeezing pressure that is not relieved or aggravated by anything.  He notes that prior to today, pain occurred in short episodes and resolved without treatment.  He reports that pain began today while he was riding a long distance on his bike.  Presently he states pain has resolved slightly since admission to ED.  He denies SOB, diaphoresis, nausea, emesis, or any other associated symptoms.  Pt admits to h/o atrial fibrillation and nonobsructive coronary atherosclerosis of native coronary artery, and has h/o catheterization in 2005.  Pt also has hyperlipidemia and HTN and notes he has not been taking his medicine.  He also notes that his father and uncles have had heart attacks.  Pt states that he has used nitroglycerin but that it gives him an "instant, 24-hour headache" each time.  He notes he takes one 325 mg aspirin every day.   Past Medical History  Diagnosis Date  . OSA (obstructive sleep apnea)   . Atrial fibrillation     Paroxysmal  . Coronary atherosclerosis of native coronary artery     Nonobstructive, LVEF normal  . Essential hypertension, benign     Past Surgical History  Procedure Laterality Date  . Tonsillectomy    . Left anterior cruciate ligament reconstruction    . Right shoulder surgery    . Knee arthroscopy    . Total knee arthroplasty      Family History   Problem Relation Age of Onset  . Pulmonary embolism Mother   . Lung disease Father   . Hyperlipidemia Brother   . Hypertension Brother   . Diabetes Other     Significant family h/o DM    History  Substance Use Topics  . Smoking status: Never Smoker   . Smokeless tobacco: Never Used  . Alcohol Use: No      Review of Systems A complete 10 system review of systems was obtained and all systems are negative except as noted in the HPI and PMH.    Allergies  Mycinettes and Penicillins  Home Medications   Current Outpatient Rx  Name  Route  Sig  Dispense  Refill  . amLODipine (NORVASC) 10 MG tablet   Oral   Take 1 tablet by mouth Daily.         [name] aspirin 325 MG tablet   Oral   Take 325 mg by mouth daily.           . Coenzyme Q10-Vitamin E 100-300 MG-UNIT WAFR   Oral   Take 1 tablet by mouth daily.         . flecainide (TAMBOCOR) 50 MG tablet   Oral   Take 1 tablet (50 mg total) by mouth 2 (two) times daily.   180 tablet   3     Patient  needs to call office and schedule an appoi ...   . ibuprofen (ADVIL,MOTRIN) 200 MG tablet   Oral   Take 200 mg by mouth every 6 (six) hours as needed.         [name] KRILL OIL PO   Oral   Take 2 capsules by mouth daily.         [name] losartan (COZAAR) 100 MG tablet   Oral   Take 1 tablet by mouth Daily.         . Multiple Vitamin (MULTIVITAMIN) tablet   Oral   Take 1 tablet by mouth daily.         . nitroGLYCERIN (NITROSTAT) 0.4 MG SL tablet   Sublingual   Place 0.4 mg under the tongue every 5 (five) minutes as needed.           . pravastatin (PRAVACHOL) 40 MG tablet   Oral   Take 1 tablet by mouth Daily.           BP 137/76  Pulse 64  Temp(Src) 97.9 F (36.6 C) (Oral)  Resp 14  Ht 6' (1.829 m)  Wt 240 lb (108.863 kg)  BMI 32.54 kg/m2  SpO2 94%  Physical Exam  Nursing note and vitals reviewed. Constitutional: He is oriented to person, place, and time. He appears well-developed and well-nourished.   HENT:  Head: Normocephalic and atraumatic.  Eyes: Conjunctivae and EOM are normal. Pupils are equal, round, and reactive to light.  Neck: Normal range of motion. Neck supple.  Cardiovascular: Normal rate, regular rhythm and normal heart sounds.   No murmur heard. Pulmonary/Chest: Effort normal and breath sounds normal.  Abdominal: Soft. Bowel sounds are normal.  Musculoskeletal: Normal range of motion.  Neurological: He is alert and oriented to person, place, and time.  Skin: Skin is warm and dry.  Psychiatric: He has a normal mood and affect.    ED Course  Procedures (including critical care time)  DIAGNOSTIC STUDIES: Oxygen Saturation is 94% on room air, adequate by my interpretation.    COORDINATION OF CARE: 12:15 PM-Discussed treatment plan which includes CXR, labs, and EKG with pt at bedside and pt agreed to plan.    Results for orders placed during the hospital encounter of 10/07/12  BASIC METABOLIC PANEL      Result Value Range   Sodium 139  135 - 145 mEq/L   Potassium 3.8  3.5 - 5.1 mEq/L   Chloride 102  96 - 112 mEq/L   CO2 25  19 - 32 mEq/L   Glucose, Bld 115 (*) 70 - 99 mg/dL   BUN 18  6 - 23 mg/dL   Creatinine, Ser 1.19  0.50 - 1.35 mg/dL   Calcium 9.3  8.4 - 14.7 mg/dL   GFR calc non Af Amer 62 (*) >90 mL/min   GFR calc Af Amer 72 (*) >90 mL/min  CBC WITH DIFFERENTIAL      Result Value Range   WBC 6.4  4.0 - 10.5 K/uL   RBC 4.85  4.22 - 5.81 MIL/uL   Hemoglobin 14.9  13.0 - 17.0 g/dL   HCT 82.9  56.2 - 13.0 %   MCV 87.0  78.0 - 100.0 fL   MCH 30.7  26.0 - 34.0 pg   MCHC 35.3  30.0 - 36.0 g/dL   RDW 86.5  78.4 - 69.6 %   Platelets 229  150 - 400 K/uL   Neutrophils Relative % 65  43 - 77 %  Neutro Abs 4.2  1.7 - 7.7 K/uL   Lymphocytes Relative 25  12 - 46 %   Lymphs Abs 1.6  0.7 - 4.0 K/uL   Monocytes Relative 6  3 - 12 %   Monocytes Absolute 0.4  0.1 - 1.0 K/uL   Eosinophils Relative 3  0 - 5 %   Eosinophils Absolute 0.2  0.0 - 0.7 K/uL    Basophils Relative 0  0 - 1 %   Basophils Absolute 0.0  0.0 - 0.1 K/uL  TROPONIN I      Result Value Range   Troponin I <0.30  <0.30 ng/mL    Dg Chest Port 1 View  10/07/2012   *RADIOLOGY REPORT*  Clinical Data: Chest pain.  Current history of atrial fibrillation.  PORTABLE CHEST - 1 VIEW 10/07/2012 1209 hours:  Comparison: CTA chest 09/10/2009.  Two-view chest x-ray 03/20/2009.  Findings: Cardiac silhouette upper normal in size, unchanged, allowing for differences in technique.  Thoracic aorta mildly tortuous, unchanged.  Hilar and mediastinal contours otherwise unremarkable.  Lungs clear.  Bronchovascular markings normal. Pulmonary vascularity normal.  No pneumothorax.  No pleural effusions.  IMPRESSION: No acute cardiopulmonary disease.   Original Report Authenticated By: Hulan Saas, M.D.       No diagnosis found.   Date: 10/07/2012  Rate: 58  Rhythm: sinus brady  QRS Axis: normal  Intervals: normal  ST/T Wave abnormalities: normal  Conduction Disutrbances: none  Narrative Interpretation: unremarkable  CRITICAL CARE Performed by: Donnetta Hutching Total critical care time: 30 Critical care time was exclusive of separately billable procedures and treating other patients. Critical care was necessary to treat or prevent imminent or life-threatening deterioration. Critical care was time spent personally by me on the following activities: development of treatment plan with patient and/or surrogate as well as nursing, discussions with consultants, evaluation of patient's response to treatment, examination of patient, obtaining history from patient or surrogate, ordering and performing treatments and interventions, ordering and review of laboratory studies, ordering and review of radiographic studies, pulse oximetry and re-evaluation of patient's condition.    MDM  Chest tightness while riding bicycle.  Patient has several risk factors including hypertension, hyperlipidemia, previous mild  coronary artery disease.   Status post catheterization 2005.  Discussed with Dr. Dietrich Pates.   Transfer to [facility]      I personally performed the services described in this documentation, which was scribed in my presence. The recorded information has been reviewed and is accurate.    Donnetta Hutching, MD 10/07/12 1459

## 2012-10-08 LAB — BASIC METABOLIC PANEL
BUN: 20 mg/dL (ref 6–23)
CO2: 27 mEq/L (ref 19–32)
Calcium: 9.4 mg/dL (ref 8.4–10.5)
GFR calc non Af Amer: 56 mL/min — ABNORMAL LOW (ref >90)
Glucose, Bld: 97 mg/dL (ref 70–99)

## 2012-10-08 LAB — TROPONIN I: Troponin I: <0.3 ng/mL (ref <0.30)

## 2012-10-08 MED ORDER — NITROGLYCERIN 0.4 MG SL SUBL: 0.4000 mg | SUBLINGUAL_TABLET | SUBLINGUAL | Status: DC | PRN

## 2012-10-08 MED ORDER — OMEPRAZOLE 20 MG PO CPDR: 20.0000 mg | DELAYED_RELEASE_CAPSULE | Freq: Every day | ORAL | Status: DC

## 2012-10-08 NOTE — Progress Notes (Signed)
Utilization Review Completed.   Detroit Frieden, RN, BSN Nurse Case Manager  [phone]  

## 2012-10-08 NOTE — Discharge Summary (Signed)
Discharge Summary   [name] MRN: [id number], DOB/AGE: 1955-08-05 57 y.o. Admit date: 10/07/2012 D/C date:     10/08/2012  Primary Cardiologist: Jonathan Dyer  Primary Discharge Diagnoses:  1. Chest pain, question GERD - PPI initiated  Secondary Discharge Diagnoses:  Past Medical History  Diagnosis Date  . OSA (obstructive sleep apnea)   . Atrial fibrillation     a. Dx 09/2009, rx'd flecainide. b. F/u event monitor 2011: NSR rare PAC PVC.  [name] Coronary atherosclerosis of native coronary artery     a. Primary nonobstructive and distal disease by cath 2005.   [name] Essential hypertension, benign   . Sleep apnea   . Bradycardia     a. Event monitor 2010: HR down to 45, asymptomatic.    Hospital Course: Jonathan Dyer is a 57 y/o M with history of OSA, CAD (primarily nonobstructive and distal disease by cath 2005, negative stress echo 2013), HTN, bradycardia, and atrial fibrillation who presented to [facility] with chest pain. This began as short-lived pain lasting several seconds without associated smptoms. It had occurred both with and without activity. On morning of admission, he was able to go on a long bike ride as usual. His CP was present even before the ride and was not exacerbated by this. He did not have any SOB. He went to his PCP and was sent to the ER. Initial troponin was negative and EKG was nonacute, SR. Due to his history he was sent to [facility] for cardiology evaluation. CP was felt to be atypical, question GERD. PPI was started. Although NSAIDS are listed on his med list, he states he has not been taking these and rarely does so. Troponins remained negative. Dr. Elease Dyer has seen and examined him today and feels he is stable for discharge. Dr. Elease Dyer has recommended followup with Jonathan Dyer in several weeks. I have left a message on our office's scheduling voicemail requesting a follow-up appointment, and our office will call the patient with this  appointment.   With regard to h/o afib, he was diagnosed with this at [facility] in 2011. He was placed on flecainide at that time and has remained stable on this ever since. The decision has been made to hold off on anticoagulation by his primary cardiologist, and he will continue to follow with Jonathan Dyer for this issue. Dr. Elease Dyer discussed continuation of flecainide with Jonathan Dyer in light of CAD history who recommended to continue at this time. They have decided to avoid CCB or BB at this time due to his baseline bradycardia.   Discharge Vitals: Blood pressure 118/65, pulse 50, temperature 98.3 F (36.8 C), temperature source Oral, resp. rate 19, height 6' (1.829 m), weight 240 lb (108.863 kg), SpO2 95.00%.  Labs: Lab Results  Component Value Date   WBC 7.0 10/07/2012   HGB 14.7 10/07/2012   HCT 41.7 10/07/2012   MCV 86.9 10/07/2012   PLT 230 10/07/2012    Recent Labs Lab 10/08/12 0450  NA 144  K 4.0  CL 108  CO2 27  BUN 20  CREATININE 1.35  CALCIUM 9.4  GLUCOSE 97    Recent Labs  10/07/12 1154 10/07/12 2010 10/07/12 2335 10/08/12 0450  TROPONINI <0.30 <0.30 <0.30 <0.30    Diagnostic Studies/Procedures   Dg Chest Port 1 View 10/07/2012   *RADIOLOGY REPORT*  Clinical Data: Chest pain.  Current history of atrial fibrillation.  PORTABLE CHEST - 1 VIEW 10/07/2012 1209 hours:  Comparison: CTA chest 09/10/2009.  Two-view chest x-ray 03/20/2009.  Findings: Cardiac silhouette upper normal in size, unchanged, allowing for differences in technique.  Thoracic aorta mildly tortuous, unchanged.  Hilar and mediastinal contours otherwise unremarkable.  Lungs clear.  Bronchovascular markings normal. Pulmonary vascularity normal.  No pneumothorax.  No pleural effusions.  IMPRESSION: No acute cardiopulmonary disease.   Original Report Authenticated By: Hulan Saas, M.D.    Discharge Medications     Medication List    TAKE these medications       amLODipine 10 MG tablet    Commonly known as:  NORVASC  Take 1 tablet by mouth Daily.     aspirin 325 MG tablet  Take 325 mg by mouth daily.     B-complex with vitamin C tablet  Take 1 tablet by mouth daily.     Coenzyme Q10-Vitamin E 100-300 MG-UNIT Wafr  Take 1 tablet by mouth daily.     diclofenac sodium 1 % Gel  Commonly known as:  VOLTAREN  Apply 2 g topically 2 (two) times daily as needed. pain     flecainide 50 MG tablet  Commonly known as:  TAMBOCOR  Take 1 tablet (50 mg total) by mouth 2 (two) times daily.     ibuprofen 200 MG tablet  Commonly known as:  ADVIL,MOTRIN  Take 400-600 mg by mouth every 8 (eight) hours as needed for pain.     KRILL OIL PO  Take 2 capsules by mouth daily.     losartan 100 MG tablet  Commonly known as:  COZAAR  Take 1 tablet by mouth Daily.     multivitamin tablet  Take 1 tablet by mouth daily.     nitroGLYCERIN 0.4 MG SL tablet  Commonly known as:  NITROSTAT  Place 1 tablet (0.4 mg total) under the tongue every 5 (five) minutes as needed for chest pain (up to 3 doses).     omeprazole 20 MG capsule  Commonly known as:  PRILOSEC  Take 1 capsule (20 mg total) by mouth daily.     pravastatin 40 MG tablet  Commonly known as:  PRAVACHOL  Take 40 mg by mouth at bedtime.        Disposition   The patient will be discharged in stable condition to home.     Discharge Orders   Future Orders Complete By Expires     Diet - low sodium heart healthy  As directed     Increase activity slowly  As directed       Follow-up Information   Follow up with Nona Dell, MD. (Our office will call you for a follow-up appointment. Please call the office if you have not heard from [place] within 3 days.)    Contact information:   [address] [place] [address] [phone]         Duration of Discharge Encounter: Greater than 30 minutes including physician and PA time.  Signed, Jonathan Spies PA-C 10/08/2012, 12:08 PM  Attending Note:   The patient was  seen and examined.  Agree with assessment and plan as noted above.  Changes made to the above note as needed.  I suspect his pain in noncardiac in nature.  It is not aggravated by long distance cycling.  Enzymes are negative.  Follow up with Jonathan Dyer.  I have discussed the A-fib with Jonathan Dyer.  We are not able to use a HR slowing medication with the Flecainide because of his baseline bradycardia ( resting HR in 40s-50s)   See  note from the day of discharge.  Vesta Mixer, Montez Hageman., MD, [facility] 10/09/2012, 7:34 AM

## 2012-10-08 NOTE — Progress Notes (Signed)
PROGRESS NOTE  Subjective:   Pt was admitted with CP.  Enzymes are negative.  CP is not affected by exertion.   Objective:    Vital Signs:   Temp:  [97.9 F (36.6 C)-98.3 F (36.8 C)] 98.3 F (36.8 C) (05/31 0402) Pulse Rate:  [49-64] 50 (05/31 0402) Resp:  [14-26] 19 (05/31 0402) BP: (118-150)/(65-85) 118/65 mmHg (05/31 0402) SpO2:  [94 %-98 %] 95 % (05/31 0402) Weight:  [240 lb (108.863 kg)] 240 lb (108.863 kg) (05/30 1142)  Last BM Date: 10/06/12   24-hour weight change: Weight change:   Weight trends: Filed Weights   10/07/12 1142  Weight: 240 lb (108.863 kg)    Intake/Output:  05/30 0701 - 05/31 0700 In: 240 [P.O.:240] Out: -      Physical Exam: BP 118/65  Pulse 50  Temp(Src) 98.3 F (36.8 C) (Oral)  Resp 19  Ht 6' (1.829 m)  Wt 240 lb (108.863 kg)  BMI 32.54 kg/m2  SpO2 95%  General: Vital signs reviewed and noted.   Head: Normocephalic, atraumatic.  Eyes: conjunctivae/corneas clear.  EOM's intact.   Throat: normal  Neck:  normal  Lungs:    clear  Heart:  RR, brady  Abdomen:  Soft, non-tender, non-distended    Extremities: No edema   Neurologic: A&O X3, CN II - XII are grossly intact.   Psych: Normal     Labs: BMET:  Recent Labs  10/07/12 1154 10/07/12 2033 10/08/12 0450  NA 139  --  144  K 3.8  --  4.0  CL 102  --  108  CO2 25  --  27  GLUCOSE 115*  --  97  BUN 18  --  20  CREATININE 1.24 1.25 1.35  CALCIUM 9.3  --  9.4    Liver function tests: No results found for this basename: AST, ALT, ALKPHOS, BILITOT, PROT, ALBUMIN,  in the last 72 hours No results found for this basename: LIPASE, AMYLASE,  in the last 72 hours  CBC:  Recent Labs  10/07/12 1154 10/07/12 2033  WBC 6.4 7.0  NEUTROABS 4.2  --   HGB 14.9 14.7  HCT 42.2 41.7  MCV 87.0 86.9  PLT 229 230    Cardiac Enzymes:  Recent Labs  10/07/12 1154 10/07/12 2010 10/07/12 2335 10/08/12 0450  TROPONINI <0.30 <0.30 <0.30 <0.30    Coagulation  Studies: No results found for this basename: LABPROT, INR,  in the last 72 hours  Other: No components found with this basename: POCBNP,  No results found for this basename: DDIMER,  in the last 72 hours No results found for this basename: HGBA1C,  in the last 72 hours No results found for this basename: CHOL, HDL, LDLCALC, TRIG, CHOLHDL,  in the last 72 hours No results found for this basename: TSH, T4TOTAL, FREET3, T3FREE, THYROIDAB,  in the last 72 hours No results found for this basename: VITAMINB12, FOLATE, FERRITIN, TIBC, IRON, RETICCTPCT,  in the last 72 hours   Other results:  Tele:  Sinus brady  Medications:    Infusions:    Scheduled Medications: . amLODipine  10 mg Oral Daily  . aspirin  324 mg Oral NOW   Or  . aspirin  300 mg Rectal NOW  . aspirin EC  81 mg Oral Daily  . atorvastatin  40 mg Oral q1800  . B-complex with vitamin C  1 tablet Oral Daily  . enoxaparin (LOVENOX) injection  0.5 mg/kg Subcutaneous Q24H  . flecainide  50 mg Oral BID  . losartan  100 mg Oral Daily  . multivitamin  1 tablet Oral Daily  . pantoprazole  40 mg Oral Daily  . sodium chloride  3 mL Intravenous Q12H    Assessment/ Plan:    1. CP:  I doubt this was due to CAD.  He has known mild CAD involving the distal LAD  He can go home today.  Follow up with Dr. Diona Browner in several weeks.   2. Atrial fib:  He continues on Flecainide.  Not on a rate slowing agent because of his baseline bradycardia ( avid long distance cyclist)   Disposition:  Length of Stay: 1  Vesta Mixer, Montez Hageman., MD, [facility] 10/08/2012, 11:15 AM Office [phone] Pager [phone]

## 2012-10-08 NOTE — Progress Notes (Signed)
Brief Nutrition Note:  RD pulled to chart for malnutrition screening tool report of unintentional weight loss. Pt reports weight loss was in fact intentional.   No nutrition interventions warranted at this time. Please consult as needed.   Clarene Duke RD, LDN Pager [phone] After Hours pager [phone]

## 2012-10-09 NOTE — Progress Notes (Signed)
Assessment unchanged. Discussed D/C instructions with pt. Verbalized understanding. RX called in to pharmacy of pt's choice. IV and tele removed. Pt left via W/C accompanied by NT.

## 2012-10-20 ENCOUNTER — Ambulatory Visit (INDEPENDENT_AMBULATORY_CARE_PROVIDER_SITE_OTHER): Payer: Medicare Other | Admitting: Adult Health

## 2012-10-20 ENCOUNTER — Encounter: Payer: Self-pay | Admitting: Adult Health

## 2012-10-20 VITALS — BP 112/58 | HR 64 | Wt 251.0 lb

## 2012-10-20 DIAGNOSIS — R079 Chest pain, unspecified: Secondary | ICD-10-CM

## 2012-10-20 DIAGNOSIS — I4891 Unspecified atrial fibrillation: Secondary | ICD-10-CM

## 2012-10-20 DIAGNOSIS — I2510 Atherosclerotic heart disease of native coronary artery without angina pectoris: Secondary | ICD-10-CM

## 2012-10-20 DIAGNOSIS — I10 Essential (primary) hypertension: Secondary | ICD-10-CM

## 2012-10-20 NOTE — Patient Instructions (Addendum)
Your physician recommends that you schedule a follow-up appointment in: 6 MONTHS  Your physician recommends that you continue on your current medications as directed. Please refer to the Current Medication list given to you today. Your physician recommends  That you start wearing compression hose. Take prescription to [facility].

## 2012-10-20 NOTE — Assessment & Plan Note (Signed)
Recent hospitalization for complaints of chest pain. Found to non-cardiac. Continue current regimen

## 2012-10-20 NOTE — Assessment & Plan Note (Signed)
Doing well. Well controlled. No changes in medications. He is asking for knee high support hose as he is having some swelling in legs and will be on a long road trip to [place] this month.  He is provided with this. He will follow up in 6 months unless symptomatic.

## 2012-10-20 NOTE — Assessment & Plan Note (Signed)
Rate is well controlled and remains in NSR. He is not on BB or CCB. Flecainide is keeping him in NSR. He without complaint of palpitations.

## 2012-10-20 NOTE — Progress Notes (Signed)
HPI: [name] is a 57 year old patient of Dr. Simona Huh were following for ongoing assessment and management of nonobstructive CAD, retention, bradycardia and atrial fibrillation. The patient was admitted to [facility] on 10/07/2012 in the setting of chest pain. The patient was found to be negative for MI and it was felt that the chest pain was atypical. He was released and was to followup post hospitalization.   Concerning atrial fibrillation he was placed on flecainide at [facility] in March of 2011. Dr. Diona Browner and Dr.Nahser discuss the need to continue flecainide in the setting of nonobstructive CAD. It was decided to continue flecainide, and avoid calcium channel blocker or beta blocker due to baseline bradycardia.  He is doing well without recurrent chest pain. He is having some trouble with swallowing and has been drinking a lot of caffeine, mostly iced tea.  Otherwise he is without complaints.      Allergies  Allergen Reactions  . Mycinette (Phenol)   . Penicillins     Current Outpatient Prescriptions  Medication Sig Dispense Refill  . amLODipine (NORVASC) 10 MG tablet Take 1 tablet by mouth Daily.      [name] aspirin 325 MG tablet Take 325 mg by mouth daily.        . B Complex-C (B-COMPLEX WITH VITAMIN C) tablet Take 1 tablet by mouth daily.      . Coenzyme Q10-Vitamin E 100-300 MG-UNIT WAFR Take 1 tablet by mouth daily.      . diclofenac sodium (VOLTAREN) 1 % GEL Apply 2 g topically 2 (two) times daily as needed. pain      . flecainide (TAMBOCOR) 50 MG tablet Take 1 tablet (50 mg total) by mouth 2 (two) times daily.  180 tablet  3  . ibuprofen (ADVIL,MOTRIN) 200 MG tablet Take 400-600 mg by mouth every 8 (eight) hours as needed for pain.       [name] KRILL OIL PO Take 2 capsules by mouth daily.      [name] losartan (COZAAR) 100 MG tablet Take 1 tablet by mouth Daily.      . Multiple Vitamin (MULTIVITAMIN) tablet Take 1 tablet by mouth daily.      [name] omeprazole  (PRILOSEC) 20 MG capsule Take 1 capsule (20 mg total) by mouth daily.  30 capsule  1  . pravastatin (PRAVACHOL) 40 MG tablet Take 40 mg by mouth at bedtime.       . nitroGLYCERIN (NITROSTAT) 0.4 MG SL tablet Place 1 tablet (0.4 mg total) under the tongue every 5 (five) minutes as needed for chest pain (up to 3 doses).  25 tablet  2   No current facility-administered medications for this visit.    Past Medical History  Diagnosis Date  . OSA (obstructive sleep apnea)   . Atrial fibrillation     a. Dx 09/2009, rx'd flecainide. b. F/u event monitor 2011: NSR rare PAC PVC.  [name] Coronary atherosclerosis of native coronary artery     a. Primary nonobstructive and distal disease by cath 2005.   [name] Essential hypertension, benign   . Sleep apnea   . Bradycardia     a. Event monitor 2010: HR down to 45, asymptomatic.    Past Surgical History  Procedure Laterality Date  . Tonsillectomy    . Left anterior cruciate ligament reconstruction    . Right shoulder surgery    . Knee arthroscopy    . Total knee arthroplasty      ZOX:WRUEAV of systems complete and  found to be negative unless listed above  PHYSICAL EXAM BP 112/58  Pulse 64  Wt 251 lb (113.853 kg)  BMI 34.03 kg/m2 General: Well developed, well nourished, in no acute distress Head: Eyes PERRLA, No xanthomas.   Normal cephalic and atramatic  Lungs: Clear bilaterally to auscultation and percussion. Heart: HRRR S1 S2, without MRG.  Pulses are 2+ & equal.            No carotid bruit. No JVD.  No abdominal bruits. No femoral bruits. Abdomen: Bowel sounds are positive, abdomen soft and non-tender without masses or                  Hernia's noted. Msk:  Back normal, normal gait. Normal strength and tone for age. Extremities: No clubbing, cyanosis or edema.  DP +1 Neuro: Alert and oriented X 3. Psych:  Good affect, responds appropriately  EKG:NSR rate of  63 bpm. QTc 413.  ASSESSMENT AND PLAN

## 2013-03-31 ENCOUNTER — Encounter: Payer: Self-pay | Admitting: Cardiology

## 2013-03-31 ENCOUNTER — Ambulatory Visit (INDEPENDENT_AMBULATORY_CARE_PROVIDER_SITE_OTHER): Payer: Medicare Other | Admitting: Cardiology

## 2013-03-31 VITALS — BP 138/81 | HR 64 | Ht 75.0 in | Wt 256.0 lb

## 2013-03-31 DIAGNOSIS — I4891 Unspecified atrial fibrillation: Secondary | ICD-10-CM

## 2013-03-31 DIAGNOSIS — I2510 Atherosclerotic heart disease of native coronary artery without angina pectoris: Secondary | ICD-10-CM

## 2013-03-31 NOTE — Progress Notes (Signed)
Clinical Summary Mr. Pangborn is a 57 y.o.male last seen in June by Ms. Lawrence NP. He presents to the office to discuss some symptoms that he had approximately 6 weeks ago. At that time he was experiencing a dull, epigastric discomfort associated with a vague feeling of fluttering. He felt that it was potentially indigestion, started taking Prilosec regularly, and the symptoms resolved within a week. He has had no exertional chest pain or subsequent palpitations. Admits that he is not exercising as regularly. He is working part-time for a [employer] laboratory technician.  He continues on flecainide for management of PAF, no beta blocker or calcium channel blocker in light of prior symptomatic bradycardia. Exercise echocardiogram from April 2013 was negative for ischemia. ECG from June showed QRS 110 ms.  CHADSVASC score is 2, we have discussed anticoagulation over time, and he has elected to go with aspirin so far.   Allergies  Allergen Reactions  . Mycinette [Phenol]   . Penicillins     Current Outpatient Prescriptions  Medication Sig Dispense Refill  . amLODipine (NORVASC) 10 MG tablet Take 1 tablet by mouth Daily.      [name] aspirin 325 MG tablet Take 325 mg by mouth daily.        . B Complex-C (B-COMPLEX WITH VITAMIN C) tablet Take 1 tablet by mouth daily.      . Coenzyme Q10-Vitamin E 100-300 MG-UNIT WAFR Take 1 tablet by mouth daily.      . diclofenac sodium (VOLTAREN) 1 % GEL Apply 2 g topically 2 (two) times daily as needed. pain      . flecainide (TAMBOCOR) 50 MG tablet Take 1 tablet (50 mg total) by mouth 2 (two) times daily.  180 tablet  3  . ibuprofen (ADVIL,MOTRIN) 200 MG tablet Take 400-600 mg by mouth every 8 (eight) hours as needed for pain.       [name] KRILL OIL PO Take 2 capsules by mouth daily.      [name] losartan (COZAAR) 100 MG tablet Take 1 tablet by mouth Daily.      . Multiple Vitamin (MULTIVITAMIN) tablet Take 1 tablet by mouth daily.      . nitroGLYCERIN (NITROSTAT) 0.4 MG  SL tablet Place 1 tablet (0.4 mg total) under the tongue every 5 (five) minutes as needed for chest pain (up to 3 doses).  25 tablet  2  . omeprazole (PRILOSEC) 20 MG capsule Take 1 capsule (20 mg total) by mouth daily.  30 capsule  1  . pravastatin (PRAVACHOL) 40 MG tablet Take 40 mg by mouth at bedtime.        No current facility-administered medications for this visit.    Past Medical History  Diagnosis Date  . OSA (obstructive sleep apnea)   . Atrial fibrillation     a. Dx 09/2009, rx'd flecainide. b. F/u event monitor 2011: NSR rare PAC PVC.  [name] Coronary atherosclerosis of native coronary artery     a. Primary nonobstructive and distal disease by cath 2005.   [name] Essential hypertension, benign   . Sleep apnea   . Bradycardia     a. Event monitor 2010: HR down to 45, asymptomatic.    Social History Mr. Stella reports that he has never smoked. He has never used smokeless tobacco. Mr. Almquist reports that he does not drink alcohol.  Review of Systems Otherwise negative except as outlined.  Physical Examination Filed Vitals:   03/31/13 0834  BP: 138/81  Pulse: 64   Filed  Weights   03/31/13 0834  Weight: 256 lb (116.121 kg)    Overweight male, appears comfortable at rest.  HEENT: Conjunctiva and lids normal, oral pharynx clear.  Neck: Supple, no carotid bruits or elevated JVP.  Lungs: Clear to auscultation, nonlabored.  Cardiac: Regular rate and rhythm, no S3 gallop.  Abdomen: Soft, nontender, bowel sounds present.  Extremities: No pitting edema, distal pulses full.    Problem List and Plan   PAROXYSMAL ATRIAL FIBRILLATION Symptomatically well controlled. Plan at this time is to continue flecainide and aspirin. He is not on calcium channel blocker or beta blocker related to symptomatic bradycardia. We can continue to discuss anticoagulation options over time.  CAD, NATIVE VESSEL Nonobstructive, branch vessel disease documented previously with negative exercise  echocardiogram from last year. Asymptomatic.    Jonelle Sidle, M.D., F.A.C.C.

## 2013-03-31 NOTE — Assessment & Plan Note (Signed)
Symptomatically well controlled. Plan at this time is to continue flecainide and aspirin. He is not on calcium channel blocker or beta blocker related to symptomatic bradycardia. We can continue to discuss anticoagulation options over time.

## 2013-03-31 NOTE — Assessment & Plan Note (Signed)
Nonobstructive, branch vessel disease documented previously with negative exercise echocardiogram from last year. Asymptomatic.

## 2013-03-31 NOTE — Patient Instructions (Signed)
Your physician wants you to follow-up in: 6 months  You will receive a reminder letter in the mail two months in advance. If you don't receive a letter, please call our office to schedule the follow-up appointment.  Your physician recommends that you continue on your current medications as directed. Please refer to the Current Medication list given to you today.  

## 2013-04-25 ENCOUNTER — Ambulatory Visit: Payer: Medicare Other | Admitting: Cardiology

## 2013-05-08 ENCOUNTER — Ambulatory Visit: Payer: Medicare Other | Admitting: Cardiology

## 2013-06-19 ENCOUNTER — Telehealth: Payer: Self-pay | Admitting: Cardiology

## 2013-06-19 MED ORDER — FLECAINIDE ACETATE 50 MG PO TABS: 50.0000 mg | ORAL_TABLET | Freq: Two times a day (BID) | ORAL | Status: DC

## 2013-06-19 NOTE — Telephone Encounter (Signed)
Medication refill

## 2013-08-28 ENCOUNTER — Ambulatory Visit: Payer: Medicare Other | Admitting: Cardiology

## 2013-09-18 ENCOUNTER — Other Ambulatory Visit (HOSPITAL_COMMUNITY): Payer: Self-pay | Admitting: Physician Assistant

## 2013-09-22 ENCOUNTER — Ambulatory Visit: Payer: Medicare Other | Admitting: Cardiology

## 2013-10-19 ENCOUNTER — Encounter: Payer: Self-pay | Admitting: Cardiology

## 2013-10-19 ENCOUNTER — Ambulatory Visit (INDEPENDENT_AMBULATORY_CARE_PROVIDER_SITE_OTHER): Payer: Medicare Other | Admitting: Cardiology

## 2013-10-19 VITALS — BP 108/62 | HR 70 | Ht 74.0 in | Wt 267.0 lb

## 2013-10-19 DIAGNOSIS — I4891 Unspecified atrial fibrillation: Secondary | ICD-10-CM

## 2013-10-19 DIAGNOSIS — I2510 Atherosclerotic heart disease of native coronary artery without angina pectoris: Secondary | ICD-10-CM

## 2013-10-19 NOTE — Assessment & Plan Note (Signed)
Symptomatically well controlled on flecainide. Due to symptomatic bradycardia noted in the past, he is not on beta blocker or calcium channel blocker at this time. ECG reviewed and stable, he remains in sinus rhythm with stable QRS duration. So far he declines anticoagulation, has a relatively low thromboembolic risk score. He is on aspirin. Six-month followup arranged.

## 2013-10-19 NOTE — Assessment & Plan Note (Signed)
No angina symptoms, history of nonobstructive disease. Negative exercise echocardiogram as of April 2013.

## 2013-10-19 NOTE — Patient Instructions (Signed)
Your physician wants you to follow-up in: 6 months with Dr.McDowell You will receive a reminder letter in the mail two months in advance. If you don't receive a letter, please call our office to schedule the follow-up appointment.     Your physician recommends that you continue on your current medications as directed. Please refer to the Current Medication list given to you today.     Thank you for choosing [facility] !        

## 2013-10-19 NOTE — Progress Notes (Signed)
Clinical Summary Mr. Petrosino is a 58 y.o.male last seen in November 2014. He has been doing well from a cardiac perspective, no sustained palpitations, no chest pain. 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 He would actually like to complete a century ride sometime around Labor Day.  ECG today shows sinus rhythm with normal QRS, increased voltage.  Exercise echocardiogram was negative for ischemia and April 2013.  CHADSVASC score is 2, we have discussed anticoagulation over time, and he has elected to go with aspirin so far.   Allergies  Allergen Reactions  . Mycinette [Phenol]   . Penicillins     Current Outpatient Prescriptions  Medication Sig Dispense Refill  . amLODipine (NORVASC) 10 MG tablet Take 1 tablet by mouth Daily.      [name] aspirin 325 MG tablet Take 325 mg by mouth daily.        . flecainide (TAMBOCOR) 50 MG tablet Take 1 tablet (50 mg total) by mouth 2 (two) times daily.  180 tablet  3  . furosemide (LASIX) 40 MG tablet 40 mg daily as needed.       [name] losartan (COZAAR) 100 MG tablet Take 1 tablet by mouth Daily.      . nitroGLYCERIN (NITROSTAT) 0.4 MG SL tablet Place 1 tablet (0.4 mg total) under the tongue every 5 (five) minutes as needed for chest pain (up to 3 doses).  25 tablet  2  . omeprazole (PRILOSEC) 20 MG capsule TAKE ONE CAPSULE BY MOUTH ONCE DAILY  30 capsule  6  . pravastatin (PRAVACHOL) 40 MG tablet Take 40 mg by mouth at bedtime.       . tamsulosin (FLOMAX) 0.4 MG CAPS capsule Take 0.4 mg by mouth daily.        No current facility-administered medications for this visit.    Past Medical History  Diagnosis Date  . OSA (obstructive sleep apnea)   . Atrial fibrillation     Diagnosed 09/2009, on flecainide  . Coronary atherosclerosis of native coronary artery     Nonobstructive and distal disease by cath 2005.   [name] Essential hypertension, benign     . Sleep apnea   . Bradycardia     Event monitor 2010: HR down to 45, asymptomatic.    Social History Mr. Wilhelmsen reports that he has never smoked. He has never used smokeless tobacco. Mr. Maberry reports that he does not drink alcohol.  Review of Systems Was having some leg swelling when he was driving long distances in a car, this is getting better now. Dr. Luan Pulling gave him a low-dose diuretic to use as needed. Otherwise negative.  Physical Examination Filed Vitals:   10/19/13 1247  BP: 108/62  Pulse: 70   Filed Weights   10/19/13 1247  Weight: 267 lb (121.11 kg)   Overweight male, appears comfortable at rest.  HEENT: Conjunctiva and lids normal, oral pharynx clear.  Neck: Supple, no carotid bruits or elevated JVP.  Lungs: Clear to auscultation, nonlabored.  Cardiac: Regular rate and rhythm, no S3 gallop.  Abdomen: Soft, nontender, bowel sounds present.  Extremities: No pitting edema, distal pulses full.    Problem List and Plan   PAROXYSMAL ATRIAL FIBRILLATION Symptomatically well controlled on flecainide. Due to symptomatic bradycardia noted in the past, he is not on beta blocker or calcium channel blocker at this time. ECG reviewed and stable,  he remains in sinus rhythm with stable QRS duration. So far he declines anticoagulation, has a relatively low thromboembolic risk score. He is on aspirin. Six-month followup arranged.  CAD, NATIVE VESSEL No angina symptoms, history of nonobstructive disease. Negative exercise echocardiogram as of April 2013.    Satira Sark, M.D., F.A.C.C.

## 2014-02-01 ENCOUNTER — Encounter (INDEPENDENT_AMBULATORY_CARE_PROVIDER_SITE_OTHER): Payer: Self-pay | Admitting: *Deleted

## 2014-02-06 ENCOUNTER — Encounter (INDEPENDENT_AMBULATORY_CARE_PROVIDER_SITE_OTHER): Payer: Self-pay | Admitting: *Deleted

## 2014-04-24 ENCOUNTER — Ambulatory Visit: Payer: Medicare Other | Admitting: Cardiology

## 2014-05-15 DIAGNOSIS — M5136 Other intervertebral disc degeneration, lumbar region: Secondary | ICD-10-CM | POA: Diagnosis not present

## 2014-05-21 DIAGNOSIS — M5136 Other intervertebral disc degeneration, lumbar region: Secondary | ICD-10-CM | POA: Diagnosis not present

## 2014-05-23 ENCOUNTER — Ambulatory Visit: Payer: Medicare Other | Admitting: Cardiology

## 2014-05-25 DIAGNOSIS — M19042 Primary osteoarthritis, left hand: Secondary | ICD-10-CM | POA: Diagnosis not present

## 2014-05-25 DIAGNOSIS — M19041 Primary osteoarthritis, right hand: Secondary | ICD-10-CM | POA: Diagnosis not present

## 2014-06-08 DIAGNOSIS — M545 Low back pain: Secondary | ICD-10-CM | POA: Diagnosis not present

## 2014-06-08 DIAGNOSIS — I119 Hypertensive heart disease without heart failure: Secondary | ICD-10-CM | POA: Diagnosis not present

## 2014-06-08 DIAGNOSIS — M159 Polyosteoarthritis, unspecified: Secondary | ICD-10-CM | POA: Diagnosis not present

## 2014-06-08 DIAGNOSIS — I2510 Atherosclerotic heart disease of native coronary artery without angina pectoris: Secondary | ICD-10-CM | POA: Diagnosis not present

## 2014-06-11 ENCOUNTER — Ambulatory Visit (INDEPENDENT_AMBULATORY_CARE_PROVIDER_SITE_OTHER): Payer: Medicare Other | Admitting: Cardiology

## 2014-06-11 ENCOUNTER — Encounter: Payer: Self-pay | Admitting: Cardiology

## 2014-06-11 VITALS — BP 150/98 | HR 60 | Resp 18 | Ht 74.0 in | Wt 271.0 lb

## 2014-06-11 DIAGNOSIS — I480 Paroxysmal atrial fibrillation: Secondary | ICD-10-CM

## 2014-06-11 DIAGNOSIS — I2510 Atherosclerotic heart disease of native coronary artery without angina pectoris: Secondary | ICD-10-CM

## 2014-06-11 DIAGNOSIS — I10 Essential (primary) hypertension: Secondary | ICD-10-CM

## 2014-06-11 DIAGNOSIS — Z79899 Other long term (current) drug therapy: Secondary | ICD-10-CM

## 2014-06-11 DIAGNOSIS — Z5181 Encounter for therapeutic drug level monitoring: Secondary | ICD-10-CM

## 2014-06-11 NOTE — Patient Instructions (Signed)
Your physician wants you to follow-up in: 6 months You will receive a reminder letter in the mail two months in advance. If you don't receive a letter, please call our office to schedule the follow-up appointment.     Your physician recommends that you continue on your current medications as directed. Please refer to the Current Medication list given to you today.      Thank you for choosing [facility] !        

## 2014-06-11 NOTE — Assessment & Plan Note (Signed)
Blood pressure elevated today. We discussed diet and weight loss, also regular aerobic exercise.

## 2014-06-11 NOTE — Assessment & Plan Note (Signed)
Symptomatically well controlled on flecainide. We do not have him on calcium channel blocker or beta blocker with low resting heart rate and history of symptomatic bradycardia. ECG today shows QRS 110 ms. No changes made. Continue observation.

## 2014-06-11 NOTE — Assessment & Plan Note (Signed)
Asymptomatic with history of nonobstructive disease and negative exercise echocardiogram in April 2013.

## 2014-06-11 NOTE — Progress Notes (Signed)
Reason for visit: Atrial fibrillation  Clinical Summary Mr. Schnitzer is a 59 y.o.male last seen in June 2015. He presents for a routine visit today, denies any significant exertional chest pain or palpitations. He is working part time as a Retail buyer at [employer]. Still having trouble with arthritic pains. He has not been riding his bicycle much over the winter, except on an indoor trainer occasionally. ECG today shows sinus bradycardia with QRS 110 ms.  Exercise echocardiogram was negative for ischemia in April 2013.  CHADSVASC score is 2. We have discussed anticoagulation over time, and he has elected to go with aspirin so far.   Allergies  Allergen Reactions  . Mycinette [Phenol]   . Penicillins     Current Outpatient Prescriptions  Medication Sig Dispense Refill  . amLODipine (NORVASC) 10 MG tablet Take 1 tablet by mouth Daily.    [name] aspirin 325 MG tablet Take 325 mg by mouth daily.      . flecainide (TAMBOCOR) 50 MG tablet Take 1 tablet (50 mg total) by mouth 2 (two) times daily. 180 tablet 3  . furosemide (LASIX) 40 MG tablet 40 mg daily as needed.     [name] losartan (COZAAR) 100 MG tablet Take 1 tablet by mouth Daily.    . nitroGLYCERIN (NITROSTAT) 0.4 MG SL tablet Place 1 tablet (0.4 mg total) under the tongue every 5 (five) minutes as needed for chest pain (up to 3 doses). 25 tablet 2  . omeprazole (PRILOSEC) 20 MG capsule TAKE ONE CAPSULE BY MOUTH ONCE DAILY 30 capsule 6  . pravastatin (PRAVACHOL) 40 MG tablet Take 40 mg by mouth at bedtime.     . tamsulosin (FLOMAX) 0.4 MG CAPS capsule Take 0.4 mg by mouth daily.      No current facility-administered medications for this visit.    Past Medical History  Diagnosis Date  . OSA (obstructive sleep apnea)   . Atrial fibrillation     Diagnosed 09/2009, on flecainide  . Coronary atherosclerosis of native coronary artery     Nonobstructive and distal disease by cath 2005.   [name] Essential hypertension, benign   . Sleep apnea     . Bradycardia     Event monitor 2010: HR down to 45, asymptomatic.    Family History  Problem Relation Age of Onset  . Pulmonary embolism Mother   . Lung disease Father   . Hyperlipidemia Brother   . Hypertension Brother   . Diabetes Other     Significant family h/o DM    Social History Mr. Volner reports that he has never smoked. He has never used smokeless tobacco. Mr. Jelley reports that he does not drink alcohol.  Review of Systems Complete review of systems negative except as otherwise outlined in the clinical summary and also the following. No cough, fevers or chills. Recent course of prednisone for back pain.  Physical Examination Filed Vitals:   06/11/14 0821  BP: 150/98  Pulse: 60  Resp: 18    Wt Readings from Last 3 Encounters:  06/11/14 271 lb (122.925 kg)  10/19/13 267 lb (121.11 kg)  03/31/13 256 lb (116.121 kg)   Overweight male, appears comfortable at rest.  HEENT: Conjunctiva and lids normal, oral pharynx clear.  Neck: Supple, no carotid bruits or elevated JVP.  Lungs: Clear to auscultation, nonlabored.  Cardiac: Regular rate and rhythm, no S3 gallop.  Abdomen: Soft, nontender, bowel sounds present.  Extremities: No pitting edema, distal pulses full.    Problem List  and Plan   PAROXYSMAL ATRIAL FIBRILLATION Symptomatically well controlled on flecainide. We do not have him on calcium channel blocker or beta blocker with low resting heart rate and history of symptomatic bradycardia. ECG today shows QRS 110 ms. No changes made. Continue observation.   Essential hypertension, benign Blood pressure elevated today. We discussed diet and weight loss, also regular aerobic exercise.   CAD, NATIVE VESSEL Asymptomatic with history of nonobstructive disease and negative exercise echocardiogram in April 2013.     Satira Sark, M.D., F.A.C.C.

## 2014-06-26 DIAGNOSIS — N3943 Post-void dribbling: Secondary | ICD-10-CM | POA: Diagnosis not present

## 2014-06-26 DIAGNOSIS — R972 Elevated prostate specific antigen [PSA]: Secondary | ICD-10-CM | POA: Diagnosis not present

## 2014-06-26 DIAGNOSIS — R3919 Other difficulties with micturition: Secondary | ICD-10-CM | POA: Diagnosis not present

## 2014-06-28 ENCOUNTER — Other Ambulatory Visit: Payer: Self-pay | Admitting: Cardiology

## 2014-06-28 MED ORDER — FLECAINIDE ACETATE 50 MG PO TABS: 50.0000 mg | ORAL_TABLET | Freq: Two times a day (BID) | ORAL | Status: DC

## 2014-06-28 NOTE — Telephone Encounter (Signed)
Spoke with Wynelle Link and gave verbal order for refill for 2016

## 2014-06-28 NOTE — Telephone Encounter (Signed)
escribed refill 

## 2014-06-28 NOTE — Telephone Encounter (Signed)
Received fax refill request  Rx # [id number] Medication:  Tambocor 50 mg tab Qty 180 Sig:  Take one tablet by mouth twice daily Physician:  Domenic Polite

## 2014-06-28 NOTE — Telephone Encounter (Signed)
Patient called. Out of medication for two days.  [facility] in [place] told him that RX has not been authorized.   Please send in RX and contact the patient  flecainide (TAMBOCOR) 50 MG tablet

## 2014-06-28 NOTE — Telephone Encounter (Signed)
medicaton refilled by barabara cates

## 2014-06-29 DIAGNOSIS — R3919 Other difficulties with micturition: Secondary | ICD-10-CM | POA: Diagnosis not present

## 2014-06-29 DIAGNOSIS — Z713 Dietary counseling and surveillance: Secondary | ICD-10-CM | POA: Diagnosis not present

## 2014-06-29 DIAGNOSIS — R972 Elevated prostate specific antigen [PSA]: Secondary | ICD-10-CM | POA: Diagnosis not present

## 2014-06-29 DIAGNOSIS — R0789 Other chest pain: Secondary | ICD-10-CM | POA: Diagnosis not present

## 2014-07-20 DIAGNOSIS — M1711 Unilateral primary osteoarthritis, right knee: Secondary | ICD-10-CM | POA: Diagnosis not present

## 2014-07-20 DIAGNOSIS — M7710 Lateral epicondylitis, unspecified elbow: Secondary | ICD-10-CM | POA: Diagnosis not present

## 2014-08-01 ENCOUNTER — Encounter: Payer: Self-pay | Admitting: Nutrition

## 2014-08-01 ENCOUNTER — Encounter: Payer: Medicare Other | Attending: Pulmonary Disease | Admitting: Nutrition

## 2014-08-01 VITALS — Ht 74.0 in | Wt 265.0 lb

## 2014-08-01 DIAGNOSIS — E669 Obesity, unspecified: Secondary | ICD-10-CM

## 2014-08-01 NOTE — Patient Instructions (Signed)
Plan.  1. Follow Plate Method as discussed.  2. Increase low carb vegetables to 2 servings at lunch and dinner. 3. Swim 30 minutes 5 days a week for exercise. 4. Lose 1-2 lbs per week. 5. Cut down on fat intake by eating baked and broiled foods and avoid fried foods.

## 2014-08-01 NOTE — Progress Notes (Signed)
  Medical Nutrition Therapy:  Appt start time: 0800 end time:  0930.  Assessment:  Primary concerns today: Overweight.  Say he needs to lose about 30 lbs due to back problems and cholesterol issues. LIves by himself. He cooks at home 90% of the time and eats out  only about 10%. Meals are prepared are mostly fried and baked. He has a [employer]. LIkes frozen vegetables. Eats 3 meals per day. 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 Use to ride his bike but since his back has been hurting a lot. Has bulgling disc and can't do a lot of exercises right now til he gets steriod injection.  Has cut oiut soft drinks. Has reduced bread,  Potatoes and reduced portions as well as eating out.  Wants to lose down to 240 lbs by July.    He has lost 9 lbs in the lost month by cutting out sodas and snacks.    DIet is fairly well balanced but need to  Increase higher fiber foods and lower carb vegetables. Needs to work on increasing physical activity.. Suggested swimming due to back and joint pain.  Preferred Learning Style:   Auditory  Visual  Hands on   Learning Readiness:   Ready  Change in progress  MEDICATIONS:See list   DIETARY INTAKE:  24-hr recall:  B ( AM): [place] bacon, 2 egg whites and 1 slice toast and some berries. Unswt tea Snk ( AM): sometimes: Fruit  L ( PM): BBQ Chickenm, veggies, appllesauce, strawberries, unswt tea Snk ( PM): none D ( PM): PIzza-3 slices, unswt tea Snk ( PM): none Beverages: unsweet water  Usual physical activity: Limited due to back pain. Suppose to get injections soon. Walks on his job..   Estimated energy needs: 2000 calories 225 g carbohydrates 150 g protein 56 g fat  Progress Towards Goal(s):  In progress.   Nutritional Diagnosis:  NB-1.1 Food and nutrition-related knowledge deficit As related to obesity  As evidenced by BMI 34..    Intervention:  Nutrition couinseling on weight loss, meal  planning, portion sizes, low fat low sodium high fiber diet,  exercises and keeping a food journal.  Plan.  1. Follow Plate Method as discussed.  2. Increase low carb vegetables to 2 servings at lunch and dinner. 3. Swim 30 minutes 5 days a week for exercise. 4. Lose 1-2 lbs per week. 5. Cut down on fat intake by eating baked and broiled foods and avoid fried foods.  Teaching Method Utilized:  Visual Auditory Hands on  Handouts given during visit include:  The Plate Method  The Meal plan card  Healthy Weight Loss TIps  Barriers to learning/adherence to lifestyle change: back problems  Demonstrated degree of understanding via:  Teach Back   Monitoring/Evaluation:  Dietary intake, exercise, meal planning, and body weight in 1 month(s)  .

## 2014-08-21 DIAGNOSIS — M5136 Other intervertebral disc degeneration, lumbar region: Secondary | ICD-10-CM | POA: Diagnosis not present

## 2014-09-13 DIAGNOSIS — I119 Hypertensive heart disease without heart failure: Secondary | ICD-10-CM | POA: Diagnosis not present

## 2014-09-13 DIAGNOSIS — I480 Paroxysmal atrial fibrillation: Secondary | ICD-10-CM | POA: Diagnosis not present

## 2014-09-13 DIAGNOSIS — M159 Polyosteoarthritis, unspecified: Secondary | ICD-10-CM | POA: Diagnosis not present

## 2014-09-13 DIAGNOSIS — L989 Disorder of the skin and subcutaneous tissue, unspecified: Secondary | ICD-10-CM | POA: Diagnosis not present

## 2014-09-19 ENCOUNTER — Encounter: Payer: Medicare Other | Attending: Pulmonary Disease | Admitting: Nutrition

## 2014-09-19 ENCOUNTER — Encounter: Payer: Self-pay | Admitting: Nutrition

## 2014-09-19 VITALS — Ht 74.0 in | Wt 261.0 lb

## 2014-09-19 DIAGNOSIS — E669 Obesity, unspecified: Secondary | ICD-10-CM

## 2014-09-19 NOTE — Patient Instructions (Signed)
Plan.  1. Ask Dr. Luan Pulling about checking an A1C level. 2. Increase whole grains and high fiber foods. 3. Stick to low carb veggies, yogurt or fruit for snacks if needed for snack. 4. Lose 1-2 lbs per week. 5. Exercise 4 hrs a week with riding his bike or walking.. 6. Cut out ice cream, desserts and snacks after supper and between meals.

## 2014-09-19 NOTE — Progress Notes (Signed)
  Medical Nutrition Therapy:  Appt start time: 0800 end time:  0830.  Assessment:  Primary concerns today: Overweight. Says that since he has tried to incorporate more carbs into his diet for balanced meals, he has been craving a lot more of them. Also received a cortisone shot about the same time and making these dietary changes. Has been walking 1-2 times per week with his dog and ridding his bike more for some exercise.[name] Has gotten sliced lunch meat now instead of processed meats. Tried to focus on more veggies. Lost 4 lbs since last visit. Drinking water and unsweet tea.    He discussed family history of diabetes on his mother's side and risk factors. No A1C has been done recently per Dr.Hawkins office. Discussed risk factors for diabetes of overweight, HTN and Hyperlipidemia and to discuss getting tested at next MD visit with Dr. Luan Pulling to even rule out prediabetes.   Diet needs more fresh fruits and higher fiber foods. Needs to cut out snacks and empty calorie foods late at night or between meals. Thinks he eats a lot more calories/food on weekends vs when he is working. Willing to increase exercise by riding his bike for desired weight loss.  Preferred Learning Style:   Auditory  Visual  Hands on   Learning Readiness:   Ready  Change in progress  MEDICATIONS:See list   DIETARY INTAKE:  24-hr recall:  B ( AM): Ham egg and cheese on flatbread, unsweet tea Snk ( AM):none L ( PM): Hamburger, microwave steam veggies, unsweet tea Snk ( PM): none D ( PM): Ice cream sandwich,1 slice bread, ham, cheese on 1 slice bread, Snk ( PM): none Beverages: unsweet water  Usual physical activity: Limited due to back pain. Suppose to get injections soon. Walks on his job..   Estimated energy needs: 2000 calories 225 g carbohydrates 150 g protein 56 g fat  Progress Towards Goal(s):  In progress.   Nutritional Diagnosis:  NB-1.1 Food and nutrition-related knowledge deficit As related  to obesity  As evidenced by BMI 34..    Intervention:  Nutrition counseling on weight loss, meal planning, portion sizes, low fat low sodium high fiber diet,  exercises and keeping a food journal.  Plan.  1. Ask Dr. Luan Pulling about checking an A1C level. 2. Increase whole grains and high fiber foods. 3. Stick to low carb veggies, yogurt or fruit for snacks if needed for snack. 4. Lose 1-2 lbs per week. 5. Exercise 4 hrs a week with riding his bike or walking.. 6. Cut out ice cream, desserts and snacks after supper and between meals.  Teaching Method Utilized:  Visual Auditory Hands on  Handouts given during visit include:  The Plate Method  The Meal plan card  Barriers to learning/adherence to lifestyle change: back problems  Demonstrated degree of understanding via:  Teach Back   Monitoring/Evaluation:  Dietary intake, exercise, meal planning, and body weight in 1 month(s)  Recommendation: Check A1C level to rule out prediabetes/Type 2 DM.

## 2014-10-12 DIAGNOSIS — I119 Hypertensive heart disease without heart failure: Secondary | ICD-10-CM | POA: Diagnosis not present

## 2014-10-12 DIAGNOSIS — E785 Hyperlipidemia, unspecified: Secondary | ICD-10-CM | POA: Diagnosis not present

## 2014-10-12 DIAGNOSIS — E669 Obesity, unspecified: Secondary | ICD-10-CM | POA: Diagnosis not present

## 2014-10-12 DIAGNOSIS — M545 Low back pain: Secondary | ICD-10-CM | POA: Diagnosis not present

## 2014-10-12 DIAGNOSIS — I2510 Atherosclerotic heart disease of native coronary artery without angina pectoris: Secondary | ICD-10-CM | POA: Diagnosis not present

## 2014-10-17 ENCOUNTER — Telehealth: Payer: Self-pay | Admitting: Nutrition

## 2014-10-17 NOTE — Telephone Encounter (Signed)
Pt . Called to say his A1C was done and it was 5.7%-prediabetes.

## 2014-11-19 ENCOUNTER — Other Ambulatory Visit: Payer: Self-pay | Admitting: *Deleted

## 2014-11-19 MED ORDER — FLECAINIDE ACETATE 50 MG PO TABS: 50.0000 mg | ORAL_TABLET | Freq: Two times a day (BID) | ORAL | Status: DC

## 2014-11-19 MED ORDER — OMEPRAZOLE 20 MG PO CPDR: 20.0000 mg | DELAYED_RELEASE_CAPSULE | Freq: Every day | ORAL | Status: DC

## 2014-11-21 DIAGNOSIS — M5136 Other intervertebral disc degeneration, lumbar region: Secondary | ICD-10-CM | POA: Diagnosis not present

## 2015-01-09 DIAGNOSIS — Z23 Encounter for immunization: Secondary | ICD-10-CM | POA: Diagnosis not present

## 2015-01-09 DIAGNOSIS — Z0000 Encounter for general adult medical examination without abnormal findings: Secondary | ICD-10-CM | POA: Diagnosis not present

## 2015-01-16 ENCOUNTER — Ambulatory Visit: Payer: Medicare Other | Admitting: Nutrition

## 2015-01-16 ENCOUNTER — Telehealth: Payer: Self-pay | Admitting: Nutrition

## 2015-01-16 NOTE — Telephone Encounter (Signed)
VM to call and reschedule missed appointment. PC 

## 2015-01-23 ENCOUNTER — Telehealth: Payer: Self-pay

## 2015-01-23 NOTE — Telephone Encounter (Signed)
Pt left Vm that he is ready to schedule colonoscoy. I returned his call and he is at work and will call me back on Monday with the meds and insurance info.

## 2015-01-30 DIAGNOSIS — L723 Sebaceous cyst: Secondary | ICD-10-CM | POA: Diagnosis not present

## 2015-01-30 DIAGNOSIS — D225 Melanocytic nevi of trunk: Secondary | ICD-10-CM | POA: Diagnosis not present

## 2015-01-30 DIAGNOSIS — D1801 Hemangioma of skin and subcutaneous tissue: Secondary | ICD-10-CM | POA: Diagnosis not present

## 2015-01-30 DIAGNOSIS — L820 Inflamed seborrheic keratosis: Secondary | ICD-10-CM | POA: Diagnosis not present

## 2015-01-30 DIAGNOSIS — L821 Other seborrheic keratosis: Secondary | ICD-10-CM | POA: Diagnosis not present

## 2015-01-30 DIAGNOSIS — L570 Actinic keratosis: Secondary | ICD-10-CM | POA: Diagnosis not present

## 2015-01-31 DIAGNOSIS — M79671 Pain in right foot: Secondary | ICD-10-CM | POA: Diagnosis not present

## 2015-01-31 DIAGNOSIS — M79672 Pain in left foot: Secondary | ICD-10-CM | POA: Diagnosis not present

## 2015-01-31 DIAGNOSIS — M722 Plantar fascial fibromatosis: Secondary | ICD-10-CM | POA: Diagnosis not present

## 2015-02-05 DIAGNOSIS — M5136 Other intervertebral disc degeneration, lumbar region: Secondary | ICD-10-CM | POA: Diagnosis not present

## 2015-02-06 NOTE — Telephone Encounter (Signed)
Pt's last colonoscopy was 05/24/2006 by Dr. [name]. He received a letter in 04/2011 to schedule next one. He  Had tubular adenoma. I have called and LMOM for a return call.

## 2015-02-07 NOTE — Telephone Encounter (Signed)
Pt is scheduled for OV with Neil Crouch, PA on 03/01/2015 at 9:00 AM.

## 2015-02-14 ENCOUNTER — Ambulatory Visit (INDEPENDENT_AMBULATORY_CARE_PROVIDER_SITE_OTHER): Payer: Medicare Other | Admitting: Cardiology

## 2015-02-14 ENCOUNTER — Encounter: Payer: Self-pay | Admitting: Cardiology

## 2015-02-14 VITALS — BP 128/76 | HR 62 | Ht 74.0 in | Wt 260.6 lb

## 2015-02-14 DIAGNOSIS — I2510 Atherosclerotic heart disease of native coronary artery without angina pectoris: Secondary | ICD-10-CM | POA: Diagnosis not present

## 2015-02-14 DIAGNOSIS — Z23 Encounter for immunization: Secondary | ICD-10-CM | POA: Diagnosis not present

## 2015-02-14 DIAGNOSIS — I480 Paroxysmal atrial fibrillation: Secondary | ICD-10-CM | POA: Diagnosis not present

## 2015-02-14 DIAGNOSIS — I10 Essential (primary) hypertension: Secondary | ICD-10-CM

## 2015-02-14 NOTE — Progress Notes (Signed)
Cardiology Office Note  Date: 02/14/2015   ID: Jonathan Dyer, DOB Mar 31, 1956, MRN [id number]  PCP: Jonathan Bogus, MD  Primary Cardiologist: Jonathan Lesches, MD   Chief Complaint  Patient presents with  . Atrial Fibrillation    History of Present Illness: Jonathan Dyer is a 59 y.o. male last seen in February. He presents for a routine follow-up visit. Still works part time as a Retail buyer at [employer]. He has had no significant increase in palpitations, no progressive dyspnea on exertion or chest pain. He is trying to lose some weight through diet, and plans to increase his exercise so that he can get back to riding his bike next Spring.  We have not had him on beta blocker or calcium channel blocker with low resting heart rate at baseline and history of symptomatic bradycardia.  CHADSVASC score is 2. Annual risk of stroke is approximately 2.3% on aspirin versus 0.8% on anticoagulant such as Eliquis. We discussed his risk for stroke and indications for anticoagulation, however he continues to prefer to stay on aspirin and did not want to make any changes today.  Last ischemic evaluation was in 2013. Follow-up ECG today shows sinus bradycardia at 47 bpm with QRS duration 119 ms and increased voltage.   Past Medical History  Diagnosis Date  . OSA (obstructive sleep apnea)   . Atrial fibrillation ([place])     Diagnosed 09/2009, on flecainide  . Coronary atherosclerosis of native coronary artery     Nonobstructive and distal disease by cath 2005.   [name] Essential hypertension, benign   . Sleep apnea   . Bradycardia     Event monitor 2010: HR down to 45, asymptomatic.    Past Surgical History  Procedure Laterality Date  . Tonsillectomy    . Left anterior cruciate ligament reconstruction    . Right shoulder surgery    . Knee arthroscopy    . Total knee arthroplasty      Current Outpatient Prescriptions  Medication Sig Dispense Refill  . amLODipine (NORVASC) 10  MG tablet Take 1 tablet by mouth Daily.    [name] aspirin 325 MG tablet Take 325 mg by mouth daily.      . flecainide (TAMBOCOR) 50 MG tablet Take 1 tablet (50 mg total) by mouth 2 (two) times daily. NEED OV. 180 tablet 1  . furosemide (LASIX) 40 MG tablet 40 mg daily as needed.     [name] losartan (COZAAR) 100 MG tablet Take 1 tablet by mouth Daily.    . nitroGLYCERIN (NITROSTAT) 0.4 MG SL tablet Place 1 tablet (0.4 mg total) under the tongue every 5 (five) minutes as needed for chest pain (up to 3 doses). 25 tablet 2  . omeprazole (PRILOSEC) 20 MG capsule Take 1 capsule (20 mg total) by mouth daily. (Patient taking differently: Take 20 mg by mouth daily. ONLY AS NEEDED) 30 capsule 6  . pravastatin (PRAVACHOL) 40 MG tablet Take 40 mg by mouth at bedtime.     . tamsulosin (FLOMAX) 0.4 MG CAPS capsule Take 0.4 mg by mouth daily.      No current facility-administered medications for this visit.    Allergies:  Mycinette and Penicillins   Social History: The patient  reports that he has never smoked. He has never used smokeless tobacco. He reports that he does not drink alcohol or use illicit drugs.   ROS:  Please see the history of present illness. Otherwise, complete review of systems is positive for back  pain and knee discomfort at times.  All other systems are reviewed and negative.   Physical Exam: VS:  BP 128/76 mmHg  Pulse 62  Ht 6\' 2"  (1.88 m)  Wt 260 lb 9.6 oz (118.207 kg)  BMI 33.44 kg/m2  SpO2 97%, BMI Body mass index is 33.44 kg/(m^2).  Wt Readings from Last 3 Encounters:  02/14/15 260 lb 9.6 oz (118.207 kg)  09/19/14 261 lb (118.389 kg)  08/01/14 265 lb (120.203 kg)     Overweight male, appears comfortable at rest.  HEENT: Conjunctiva and lids normal, oral pharynx clear.  Neck: Supple, no carotid bruits or elevated JVP.  Lungs: Clear to auscultation, nonlabored.  Cardiac: Regular rate and rhythm, no S3 gallop.  Abdomen: Soft, nontender, bowel sounds present.  Extremities:  No pitting edema, distal pulses full.  Musculoskeletal: No kyphosis. Skin: Warm and dry. Neuropsychiatric: Alert and oriented 3, affect appropriate.  ECG: Tracing from 06/12/2014 showed sinus bradycardia at 54 bpm with QRS duration 110 ms.  Other Studies Reviewed Today:  Exercise echocardiogram 08/28/2011 [facility]) was negative for ischemia at 89% MPHR, no chest pain reported, no inducible wall motion abnormalities to indicate ischemia.  ASSESSMENT AND PLAN:  1. Paroxysmal atrial fibrillation with CAHDSVASC score of 2. He is symptomatically well controlled and in sinus rhythm on flecainide. As noted above, he prefers to stay on aspirin for now. We will obtain a follow-up GXT for ischemic surveillance.  2. History of mild coronary atherosclerosis, nonobstructive. No angina symptoms.  3. Essential hypertension, blood pressure is well controlled today.  4. History of sleep apnea, followed by Dr. Luan Dyer.  Current medicines were reviewed at length with the patient today.   Orders Placed This Encounter  Procedures  . Flu Vaccine QUAD 36+ mos IM  . Exercise Tolerance Test  . EKG 12-Lead    Disposition: FU with me in 6 months.   Signed, Jonathan Sark, MD, [facility] 02/14/2015 8:47 AM    [facility] [place]. [address], [place], [place] [address] Phone: [phone]; Fax: [phone]

## 2015-02-14 NOTE — Patient Instructions (Signed)
Your physician wants you to follow-up in: 6 months with dr Ferne Reus will receive a reminder letter in the mail two months in advance. If you don't receive a letter, please call our office to schedule the follow-up appointment.    Your physician recommends that you continue on your current medications as directed. Please refer to the Current Medication list given to you today.    Your physician has requested that you have an exercise tolerance test. For further information please visit [email or web address]. Please also follow instruction sheet, as given.     Thank you for choosing [facility] !

## 2015-02-25 ENCOUNTER — Other Ambulatory Visit (HOSPITAL_COMMUNITY): Payer: Self-pay | Admitting: Pulmonary Disease

## 2015-02-25 DIAGNOSIS — M545 Low back pain: Secondary | ICD-10-CM

## 2015-03-01 ENCOUNTER — Telehealth: Payer: Self-pay

## 2015-03-01 ENCOUNTER — Encounter: Payer: Self-pay | Admitting: Gastroenterology

## 2015-03-01 ENCOUNTER — Ambulatory Visit (INDEPENDENT_AMBULATORY_CARE_PROVIDER_SITE_OTHER): Payer: Medicare Other | Admitting: Gastroenterology

## 2015-03-01 VITALS — BP 128/76 | HR 53 | Temp 97.4°F | Ht 75.0 in | Wt 264.0 lb

## 2015-03-01 DIAGNOSIS — Z860101 Personal history of adenomatous and serrated colon polyps: Secondary | ICD-10-CM | POA: Insufficient documentation

## 2015-03-01 DIAGNOSIS — Z86010 Personal history of colonic polyps: Secondary | ICD-10-CM

## 2015-03-01 NOTE — Assessment & Plan Note (Signed)
59 year old gentleman with history of adenomatous colon polyps who presents for surveillance colonoscopy. Colonoscopy in near future with Dr. [name].  I have discussed the risks, alternatives, benefits with regards to but not limited to the risk of reaction to medication, bleeding, infection, perforation and the patient is agreeable to proceed. Written consent to be obtained.

## 2015-03-01 NOTE — Telephone Encounter (Signed)
Pt called office to set up his TCS after he left from his office visit. States that he would like to wait until after Christmas to have this done if at all possible due to his schedule at work.

## 2015-03-01 NOTE — Patient Instructions (Signed)
1. Colonoscopy as scheduled. See separate instructions.  

## 2015-03-01 NOTE — Progress Notes (Signed)
Primary Care Physician:  Alonza Bogus, MD  Primary Gastroenterologist:  Barney Drain, MD   Chief Complaint  Patient presents with  . Colonoscopy    HPI:  JEREMAINE MARAJ is a 59 y.o. male here to schedule surveillance colonoscopy. Colonoscopy back in 2008 3 mm sigmoid colon tubular adenoma removed. He is feeling well. Denies constipation, diarrhea, melena, rectal bleeding. No abdominal pain. Rare heartburn. Takes omeprazole only as needed. Denies dysphagia. No unintentional weight loss.no family history of colon cancer.  History of sleep apnea, patient does not use CPAP machine (intolerant).  Current Outpatient Prescriptions  Medication Sig Dispense Refill  . amLODipine (NORVASC) 10 MG tablet Take 1 tablet by mouth Daily.    [name] aspirin 325 MG tablet Take 325 mg by mouth daily.      . flecainide (TAMBOCOR) 50 MG tablet Take 1 tablet (50 mg total) by mouth 2 (two) times daily. NEED OV. 180 tablet 1  . furosemide (LASIX) 40 MG tablet 40 mg daily as needed.     [name] losartan (COZAAR) 100 MG tablet Take 1 tablet by mouth Daily.    . nitroGLYCERIN (NITROSTAT) 0.4 MG SL tablet Place 1 tablet (0.4 mg total) under the tongue every 5 (five) minutes as needed for chest pain (up to 3 doses). 25 tablet 2  . omeprazole (PRILOSEC) 20 MG capsule Take 1 capsule (20 mg total) by mouth daily. (Patient taking differently: Take 20 mg by mouth daily. ONLY AS NEEDED) 30 capsule 6  . pravastatin (PRAVACHOL) 40 MG tablet Take 40 mg by mouth at bedtime.     . tamsulosin (FLOMAX) 0.4 MG CAPS capsule Take 0.4 mg by mouth daily.      No current facility-administered medications for this visit.    Allergies as of 03/01/2015 - Review Complete 03/01/2015  Allergen Reaction Noted  . Mycinette [phenol]  10/07/2012  . Penicillins      Past Medical History  Diagnosis Date  . OSA (obstructive sleep apnea)   . Atrial fibrillation ([place])     Diagnosed 09/2009, on flecainide  . Coronary atherosclerosis of native  coronary artery     Nonobstructive and distal disease by cath 2005.   [name] Essential hypertension, benign   . Sleep apnea   . Bradycardia     Event monitor 2010: HR down to 45, asymptomatic.    Past Surgical History  Procedure Laterality Date  . Tonsillectomy    . Left anterior cruciate ligament reconstruction    . Right shoulder surgery    . Knee arthroscopy    . Total knee arthroplasty      Family History  Problem Relation Age of Onset  . Pulmonary embolism Mother   . Lung disease Father   . Hyperlipidemia Brother   . Hypertension Brother   . Diabetes Other     Significant family h/o DM    Social History   Social History  . Marital Status: Divorced    Spouse Name: N/A  . Number of Children: N/A  . Years of Education: N/A   Occupational History  . Not on file.   Social History Main Topics  . Smoking status: Never Smoker   . Smokeless tobacco: Never Used  . Alcohol Use: No  . Drug Use: No  . Sexual Activity: Not on file   Other Topics Concern  . Not on file   Social History Narrative      ROS:  General: Negative for anorexia, weight loss, fever, chills, fatigue, weakness. Eyes: Negative  for vision changes.  ENT: Negative for hoarseness, difficulty swallowing , nasal congestion. CV: Negative for chest pain, angina, palpitations, dyspnea on exertion, peripheral edema.  Respiratory: Negative for dyspnea at rest, dyspnea on exertion, cough, sputum, wheezing.  GI: See history of present illness. GU:  Negative for dysuria, hematuria, urinary incontinence, urinary frequency, nocturnal urination.  MS: Negative for joint pain, low back pain.  Derm: Negative for rash or itching.  Neuro: Negative for weakness, abnormal sensation, seizure, frequent headaches, memory loss, confusion.  Psych: Negative for anxiety, depression, suicidal ideation, hallucinations.  Endo: Negative for unusual weight change.  Heme: Negative for bruising or bleeding. Allergy: Negative for  rash or hives.    Physical Examination:  BP 128/76 mmHg  Pulse 53  Temp(Src) 97.4 F (36.3 C) (Oral)  Ht 6\' 3"  (1.905 m)  Wt 264 lb (119.75 kg)  BMI 33.00 kg/m2   General: Well-nourished, well-developed in no acute distress.  Head: Normocephalic, atraumatic.   Eyes: Conjunctiva pink, no icterus. Mouth: Oropharyngeal mucosa moist and pink , no lesions erythema or exudate. Neck: Supple without thyromegaly, masses, or lymphadenopathy.  Lungs: Clear to auscultation bilaterally.  Heart: Regular rate and rhythm, no murmurs rubs or gallops.  Abdomen: Bowel sounds are normal, nontender, nondistended, no hepatosplenomegaly or masses, no abdominal bruits or    hernia , no rebound or guarding.   Rectal: not performed Extremities: No lower extremity edema. No clubbing or deformities.  Neuro: Alert and oriented x 4 , grossly normal neurologically.  Skin: Warm and dry, no rash or jaundice.   Psych: Alert and cooperative, normal mood and affect.  Labs: None available  Imaging Studies: No results found.

## 2015-03-01 NOTE — Progress Notes (Signed)
CC'ED TO PCP 

## 2015-03-04 ENCOUNTER — Ambulatory Visit (HOSPITAL_COMMUNITY)
Admission: RE | Admit: 2015-03-04 | Discharge: 2015-03-04 | Disposition: A | Discharge status: Home / Self Care | Payer: Medicare Other | Source: Ambulatory Visit | Attending: Cardiology | Admitting: Cardiology

## 2015-03-04 DIAGNOSIS — I480 Paroxysmal atrial fibrillation: Secondary | ICD-10-CM | POA: Insufficient documentation

## 2015-03-04 LAB — EXERCISE TOLERANCE TEST
CHL CUP RESTING HR STRESS: 54 {beats}/min
CHL RATE OF PERCEIVED EXERTION: 14
CSEPED: 9 min
CSEPHR: 86 %
Estimated workload: 10.8 METS
Exercise duration (sec): 13 s
MPHR: 159 {beats}/min
Peak HR: 137 {beats}/min

## 2015-03-04 NOTE — Telephone Encounter (Signed)
Noted! With my call lists.

## 2015-03-04 NOTE — Telephone Encounter (Signed)
Sending to Triage

## 2015-03-04 NOTE — Telephone Encounter (Signed)
TRIAGE PATIENT FOR ENDOSCOPY AFTER CHRISTMAS.

## 2015-03-04 NOTE — Progress Notes (Signed)
REVIEWED-NO ADDITIONAL RECOMMENDATIONS. 

## 2015-03-04 NOTE — Telephone Encounter (Signed)
Dr. [name], can we triage this patient for colonoscopy after Christmas. He has h/o adenomatous colon polyps but not current problems.

## 2015-03-04 NOTE — Telephone Encounter (Signed)
Routing to Doris for Triage 

## 2015-03-07 ENCOUNTER — Ambulatory Visit (HOSPITAL_COMMUNITY): Payer: Medicare Other

## 2015-03-07 ENCOUNTER — Ambulatory Visit (HOSPITAL_COMMUNITY)
Admission: RE | Admit: 2015-03-07 | Discharge: 2015-03-07 | Disposition: A | Discharge status: Home / Self Care | Payer: Medicare Other | Source: Ambulatory Visit | Attending: Pulmonary Disease | Admitting: Pulmonary Disease

## 2015-03-07 DIAGNOSIS — M79605 Pain in left leg: Secondary | ICD-10-CM | POA: Diagnosis not present

## 2015-03-07 DIAGNOSIS — Q619 Cystic kidney disease, unspecified: Secondary | ICD-10-CM | POA: Diagnosis not present

## 2015-03-07 DIAGNOSIS — M545 Low back pain: Secondary | ICD-10-CM | POA: Insufficient documentation

## 2015-03-07 DIAGNOSIS — M79604 Pain in right leg: Secondary | ICD-10-CM | POA: Diagnosis not present

## 2015-03-07 DIAGNOSIS — M5126 Other intervertebral disc displacement, lumbar region: Secondary | ICD-10-CM | POA: Diagnosis not present

## 2015-03-25 DIAGNOSIS — Z6834 Body mass index (BMI) 34.0-34.9, adult: Secondary | ICD-10-CM | POA: Diagnosis not present

## 2015-03-25 DIAGNOSIS — M6281 Muscle weakness (generalized): Secondary | ICD-10-CM | POA: Diagnosis not present

## 2015-04-11 DIAGNOSIS — I25119 Atherosclerotic heart disease of native coronary artery with unspecified angina pectoris: Secondary | ICD-10-CM | POA: Diagnosis not present

## 2015-04-11 DIAGNOSIS — M159 Polyosteoarthritis, unspecified: Secondary | ICD-10-CM | POA: Diagnosis not present

## 2015-04-11 DIAGNOSIS — I480 Paroxysmal atrial fibrillation: Secondary | ICD-10-CM | POA: Diagnosis not present

## 2015-04-11 DIAGNOSIS — M545 Low back pain: Secondary | ICD-10-CM | POA: Diagnosis not present

## 2015-04-16 DIAGNOSIS — M25551 Pain in right hip: Secondary | ICD-10-CM | POA: Diagnosis not present

## 2015-04-16 DIAGNOSIS — M545 Low back pain: Secondary | ICD-10-CM | POA: Diagnosis not present

## 2015-04-16 DIAGNOSIS — M6281 Muscle weakness (generalized): Secondary | ICD-10-CM | POA: Diagnosis not present

## 2015-04-16 DIAGNOSIS — M25552 Pain in left hip: Secondary | ICD-10-CM | POA: Diagnosis not present

## 2015-04-16 DIAGNOSIS — G8929 Other chronic pain: Secondary | ICD-10-CM | POA: Diagnosis not present

## 2015-04-25 DIAGNOSIS — M545 Low back pain: Secondary | ICD-10-CM | POA: Diagnosis not present

## 2015-04-25 DIAGNOSIS — M47816 Spondylosis without myelopathy or radiculopathy, lumbar region: Secondary | ICD-10-CM | POA: Diagnosis not present

## 2015-04-25 DIAGNOSIS — M25551 Pain in right hip: Secondary | ICD-10-CM | POA: Diagnosis not present

## 2015-04-25 DIAGNOSIS — Z6836 Body mass index (BMI) 36.0-36.9, adult: Secondary | ICD-10-CM | POA: Diagnosis not present

## 2015-05-09 ENCOUNTER — Other Ambulatory Visit: Payer: Self-pay

## 2015-05-09 ENCOUNTER — Telehealth: Payer: Self-pay

## 2015-05-09 DIAGNOSIS — Z1211 Encounter for screening for malignant neoplasm of colon: Secondary | ICD-10-CM

## 2015-05-09 NOTE — Telephone Encounter (Signed)
Gastroenterology Pre-Procedure Review  Request Date: 05/09/2015 Requesting Physician: Neil Crouch, PA and Dr. [name]  PATIENT REVIEW QUESTIONS: The patient responded to the following health history questions as indicated:    Pt has a hx of adenomatous polyps  1. Diabetes Melitis: no 2. Joint replacements in the past 12 months: no 3. Major health problems in the past 3 months: no 4. Has an artificial valve or MVP: no 5. Has a defibrillator: no 6. Has been advised in past to take antibiotics in advance of a procedure like teeth cleaning: YES   After knee replacement in 2010 7. Family history of colon cancer: no  8. Alcohol Use: no 9. History of sleep apnea: pt said he was diagnosed with borderline sleep apnea and had a C-pap but he does not use it    MEDICATIONS & ALLERGIES:    Patient reports the following regarding taking any blood thinners:   Plavix? no Aspirin? YES Coumadin? no  Patient confirms/reports the following medications:  Current Outpatient Prescriptions  Medication Sig Dispense Refill  . amLODipine (NORVASC) 10 MG tablet Take 1 tablet by mouth Daily.    [name] aspirin 325 MG tablet Take 325 mg by mouth daily.      . flecainide (TAMBOCOR) 50 MG tablet Take 1 tablet (50 mg total) by mouth 2 (two) times daily. NEED OV. 180 tablet 1  . furosemide (LASIX) 40 MG tablet 40 mg daily as needed.     [name] losartan (COZAAR) 100 MG tablet Take 1 tablet by mouth Daily.    [name] omeprazole (PRILOSEC) 20 MG capsule Take 1 capsule (20 mg total) by mouth daily. (Patient taking differently: Take 20 mg by mouth daily. ONLY AS NEEDED) 30 capsule 6  . pravastatin (PRAVACHOL) 40 MG tablet Take 40 mg by mouth at bedtime.     . tamsulosin (FLOMAX) 0.4 MG CAPS capsule Take 0.4 mg by mouth daily.     . nitroGLYCERIN (NITROSTAT) 0.4 MG SL tablet Place 1 tablet (0.4 mg total) under the tongue every 5 (five) minutes as needed for chest pain (up to 3 doses). (Patient not taking: Reported on 05/09/2015) 25  tablet 2   No current facility-administered medications for this visit.    Patient confirms/reports the following allergies:  Allergies  Allergen Reactions  . Mycinette [Phenol]   . Penicillins     No orders of the defined types were placed in this encounter.    AUTHORIZATION INFORMATION Primary Insurance:   ID #:  Group #:  Pre-Cert / Auth required: Pre-Cert / Auth #:   Secondary Insurance:   ID #:  Group #:  Pre-Cert / Auth required:  Pre-Cert / Auth #:   SCHEDULE INFORMATION: Procedure has been scheduled as follows:  Date: 05/27/2015                 Time:  1:45 PM Location: [facility] Short Stay   This Gastroenterology Pre-Precedure Review Form is being routed to the following provider(s): Barney Drain, MD

## 2015-05-14 NOTE — Telephone Encounter (Signed)
OK to schedule. Notify endo regarding sleep apnea history.

## 2015-05-15 ENCOUNTER — Other Ambulatory Visit: Payer: Self-pay | Admitting: Cardiology

## 2015-05-15 MED ORDER — NA SULFATE-K SULFATE-MG SULF 17.5-3.13-1.6 GM/177ML PO SOLN: 1.0000 | ORAL | Status: DC

## 2015-05-15 NOTE — Telephone Encounter (Signed)
Rx sent to the pharmacy and instructions mailed to pt. Jonathan Dyer in Endo is aware and has made a note in the order that pt has been diagnosed with borderline sleep apnea, has a c-pap but does not use it.

## 2015-05-16 ENCOUNTER — Telehealth: Payer: Self-pay

## 2015-05-16 NOTE — Telephone Encounter (Signed)
Rosendo Gros called [phone] and spoke to [place] and no PA required for the CPT code of 82956.

## 2015-05-22 ENCOUNTER — Telehealth: Payer: Self-pay | Admitting: Internal Medicine

## 2015-05-22 NOTE — Telephone Encounter (Signed)
Pt called to ask about having a driver to and from the hospital on the day of his procedure. I told him that someone needs to be there from beginning to end. He said someone could drop him off and someone else would come to pick him up. I told him that's fine as long as someone was there and not leave without anyone in the waiting room. Patient agreed. He also has questions about his prep and his instructions. [phone]:[id number]

## 2015-05-22 NOTE — Telephone Encounter (Signed)
I went over the prep instructions with the pt and he expressed understanding. He also asked about a 62 year old grandson ( getting ready to turn 77) staying at the hospital with him and one son take him and the other son pick him up.  I explained to him the reason an adult should be with him instead of a young person, in the event some complications did occur and he expressed understanding.

## 2015-05-23 ENCOUNTER — Telehealth: Payer: Self-pay

## 2015-05-23 NOTE — Telephone Encounter (Signed)
REVIEWED-NO ADDITIONAL RECOMMENDATIONS. 

## 2015-05-23 NOTE — Telephone Encounter (Signed)
Pt called to cancel his colonoscopy with Dr. [name] that was scheduled for Monday 05/27/2015. He is having problems with his transportation people. Michela Pitcher he will probably reschedule around Easter or around July 4th when family is off work.  Hoyle Sauer in Endo is aware.

## 2015-05-27 ENCOUNTER — Encounter (HOSPITAL_COMMUNITY): Admission: RE | Payer: Self-pay | Source: Ambulatory Visit

## 2015-05-27 ENCOUNTER — Ambulatory Visit (HOSPITAL_COMMUNITY): Admission: RE | Admit: 2015-05-27 | Payer: Medicare Other | Source: Ambulatory Visit | Admitting: Gastroenterology

## 2015-05-27 SURGERY — COLONOSCOPY
Anesthesia: Moderate Sedation

## 2015-07-11 ENCOUNTER — Encounter: Payer: Self-pay | Admitting: Family Medicine

## 2015-07-11 DIAGNOSIS — Z125 Encounter for screening for malignant neoplasm of prostate: Secondary | ICD-10-CM | POA: Diagnosis not present

## 2015-07-11 DIAGNOSIS — I25119 Atherosclerotic heart disease of native coronary artery with unspecified angina pectoris: Secondary | ICD-10-CM | POA: Diagnosis not present

## 2015-07-11 DIAGNOSIS — I119 Hypertensive heart disease without heart failure: Secondary | ICD-10-CM | POA: Diagnosis not present

## 2015-07-11 DIAGNOSIS — Z0000 Encounter for general adult medical examination without abnormal findings: Secondary | ICD-10-CM | POA: Diagnosis not present

## 2015-07-11 DIAGNOSIS — E785 Hyperlipidemia, unspecified: Secondary | ICD-10-CM | POA: Diagnosis not present

## 2015-07-12 DIAGNOSIS — M1711 Unilateral primary osteoarthritis, right knee: Secondary | ICD-10-CM | POA: Diagnosis not present

## 2015-08-09 DIAGNOSIS — R972 Elevated prostate specific antigen [PSA]: Secondary | ICD-10-CM | POA: Diagnosis not present

## 2015-09-04 DIAGNOSIS — H2513 Age-related nuclear cataract, bilateral: Secondary | ICD-10-CM | POA: Diagnosis not present

## 2015-09-06 ENCOUNTER — Encounter: Payer: Self-pay | Admitting: Cardiology

## 2015-09-06 ENCOUNTER — Ambulatory Visit (INDEPENDENT_AMBULATORY_CARE_PROVIDER_SITE_OTHER): Payer: Medicare Other | Admitting: Cardiology

## 2015-09-06 VITALS — BP 124/70 | HR 61 | Ht 73.0 in | Wt 257.0 lb

## 2015-09-06 DIAGNOSIS — R001 Bradycardia, unspecified: Secondary | ICD-10-CM

## 2015-09-06 DIAGNOSIS — R42 Dizziness and giddiness: Secondary | ICD-10-CM | POA: Diagnosis not present

## 2015-09-06 DIAGNOSIS — I10 Essential (primary) hypertension: Secondary | ICD-10-CM

## 2015-09-06 DIAGNOSIS — I480 Paroxysmal atrial fibrillation: Secondary | ICD-10-CM | POA: Diagnosis not present

## 2015-09-06 NOTE — Patient Instructions (Signed)
Your physician wants you to follow-up in: 6 months You will receive a reminder letter in the mail two months in advance. If you don't receive a letter, please call our office to schedule the follow-up appointment.    Your physician recommends that you continue on your current medications as directed. Please refer to the Current Medication list given to you today.    If you need a refill on your cardiac medications before your next appointment, please call your pharmacy.     Thank you for choosing [facility] !        

## 2015-09-06 NOTE — Progress Notes (Signed)
Cardiology Office Note  Date: 09/06/2015   ID: SELDEN NOTEBOOM, DOB 1956/05/02, MRN [id number]  PCP: Alonza Bogus, MD  Primary Cardiologist: Rozann Lesches, MD   Chief Complaint  Patient presents with  . PAF    History of Present Illness: Jonathan Dyer is a 60 y.o. male last seen in October 2016. He presents for a routine follow-up visit. Still working part-time in the [employer] system. He states that he plans to go work over the summer in a [employer] out in [place].  He does not report any sense of palpitations. Interestingly, just earlier this week he had a "stomach bug" and states that he had a few episodes of dizziness afterwards. No definite feeling that he was in atrial fibrillation. His heart rate is regular today. ECG reviewed and shows sinus rhythm with QRS 120 ms.  CHADSVASC score is 2. Annual risk of stroke is approximately 2.3% on aspirin versus 0.8% on anticoagulant such as Eliquis. We discussed his risk for stroke and indications for anticoagulation, however he continues to prefer to stay on aspirin and did not want to make any changes today.  He reports having a recent wellness physical with Dr. Luan Dyer. I am requesting the lab work for review.  Past Medical History  Diagnosis Date  . OSA (obstructive sleep apnea)   . Atrial fibrillation ([place])     Diagnosed 09/2009, on flecainide  . Coronary atherosclerosis of native coronary artery     Nonobstructive and distal disease by cath 2005.   Jonathan Dyer Essential hypertension, benign   . Sleep apnea   . Bradycardia     Event monitor 2010: HR down to 45, asymptomatic.  Jonathan Dyer Hyperlipidemia     Past Surgical History  Procedure Laterality Date  . Tonsillectomy    . Left anterior cruciate ligament reconstruction    . Right shoulder surgery    . Knee arthroscopy    . Total knee arthroplasty    . Colonoscopy  05/2006    SLF: 3 mm sigmoid: Tubular adenoma removed.    Current Outpatient Prescriptions   Medication Sig Dispense Refill  . amLODipine (NORVASC) 10 MG tablet Take 1 tablet by mouth Daily.    Jonathan Dyer aspirin 325 MG tablet Take 325 mg by mouth daily.      Jonathan Dyer b complex vitamins tablet Take 1 tablet by mouth daily.    . flecainide (TAMBOCOR) 50 MG tablet Take 1 tablet by mouth  twice a day 180 tablet 3  . furosemide (LASIX) 40 MG tablet 40 mg daily as needed.     Jonathan Dyer losartan (COZAAR) 100 MG tablet Take 1 tablet by mouth Daily.    . nitroGLYCERIN (NITROSTAT) 0.4 MG SL tablet Place 1 tablet (0.4 mg total) under the tongue every 5 (five) minutes as needed for chest pain (up to 3 doses). 25 tablet 2  . omeprazole (PRILOSEC) 20 MG capsule Take 1 capsule (20 mg total) by mouth daily. (Patient taking differently: Take 20 mg by mouth daily. ONLY AS NEEDED) 30 capsule 6  . pravastatin (PRAVACHOL) 40 MG tablet Take 40 mg by mouth at bedtime.     . tamsulosin (FLOMAX) 0.4 MG CAPS capsule Take 0.4 mg by mouth daily.     . Na Sulfate-K Sulfate-Mg Sulf (SUPREP BOWEL PREP) SOLN Take 1 kit by mouth as directed. (Patient not taking: Reported on 09/06/2015) 1 Bottle 0   No current facility-administered medications for this visit.   Allergies:  Mycinette and Penicillins  Social History: The patient  reports that he has never smoked. He has never used smokeless tobacco. He reports that he does not drink alcohol or use illicit drugs.   ROS:  Please see the history of present illness. Otherwise, complete review of systems is positive for chronic back pain.  All other systems are reviewed and negative.   Physical Exam: VS:  BP 124/70 mmHg  Pulse 61  Ht 6' 1"  (1.854 m)  Wt 257 lb (116.574 kg)  BMI 33.91 kg/m2  SpO2 91%, BMI Body mass index is 33.91 kg/(m^2).  Wt Readings from Last 3 Encounters:  09/06/15 257 lb (116.574 kg)  03/07/15 259 lb (117.482 kg)  03/01/15 264 lb (119.75 kg)    Overweight male, appears comfortable at rest.  HEENT: Conjunctiva and lids normal, oral pharynx clear.  Neck: Supple,  no carotid bruits or elevated JVP.  Lungs: Clear to auscultation, nonlabored.  Cardiac: Regular rate and rhythm, no S3 gallop.  Abdomen: Soft, nontender, bowel sounds present.  Extremities: No pitting edema, distal pulses full.  Musculoskeletal: No kyphosis. Skin: Warm and dry. Neuropsychiatric: Alert and oriented 3, affect appropriate.  ECG: I personally reviewed the prior tracing from 06/12/2014 which showed sinus bradycardia with QRS duration 110 ms.  Other Studies Reviewed Today:  Exercise echocardiogram 08/28/2011 [facility]): Negative for ischemia at 89% MPHR, no chest pain reported, no inducible wall motion abnormalities to indicate ischemia.  Assessment and Plan:  1. Paroxysmal atrial fibrillation. Overall he has been symptomatically stable on current regimen including flecainide and aspirin. He has declined anticoagulation so far. Not on a heart rate control medication given bradycardia at baseline.  2. Recent episodes of dizziness, no syncope. Could have perhaps been related to increased vagal tone with recent "stomach bug." If continues to recur we will place a cardiac monitor.  3. Essential hypertension, blood pressure is adequately controlled today.  4. History of bradycardia with heart rate down into the 40s at times, as noted above he is not on any heart rate lowering medications.  Current medicines were reviewed with the patient today.   Orders Placed This Encounter  Procedures  . EKG 12-Lead    Disposition: FU with me in 6 months.   Signed, Satira Sark, MD, [facility] 09/06/2015 2:01 PM    [facility] at [facility] [address] [place], [place] [address] Phone: [phone]; Fax: [phone]

## 2016-01-01 DIAGNOSIS — I10 Essential (primary) hypertension: Secondary | ICD-10-CM | POA: Insufficient documentation

## 2016-01-01 DIAGNOSIS — R509 Fever, unspecified: Secondary | ICD-10-CM | POA: Diagnosis not present

## 2016-01-01 DIAGNOSIS — R0981 Nasal congestion: Secondary | ICD-10-CM | POA: Diagnosis not present

## 2016-03-02 ENCOUNTER — Other Ambulatory Visit (HOSPITAL_COMMUNITY): Payer: Self-pay | Admitting: Pulmonary Disease

## 2016-03-02 ENCOUNTER — Ambulatory Visit (HOSPITAL_COMMUNITY)
Admission: RE | Admit: 2016-03-02 | Discharge: 2016-03-02 | Disposition: A | Discharge status: Home / Self Care | Payer: Medicare Other | Source: Ambulatory Visit | Attending: Pulmonary Disease | Admitting: Pulmonary Disease

## 2016-03-02 DIAGNOSIS — I5030 Unspecified diastolic (congestive) heart failure: Secondary | ICD-10-CM

## 2016-03-02 LAB — ECHOCARDIOGRAM COMPLETE
CHL CUP DOP CALC LVOT VTI: 33.4 cm
CHL CUP MV DEC (S): 222
CHL CUP STROKE VOLUME: 66 mL
E decel time: 222 msec
E/e' ratio: 8.15
FS: 35 % (ref 28–44)
IV/PV OW: 1.12
LADIAMINDEX: 1.49 cm/m2
LASIZE: 37 mm
LAVOL: 72.4 mL
LAVOLA4C: 62.9 mL
LAVOLIN: 29.1 mL/m2
LDCA: 4.15 cm2
LEFT ATRIUM END SYS DIAM: 37 mm
LV E/e'average: 8.15
LV sys vol index: 19 mL/m2
LV sys vol: 47 mL (ref 21–61)
LVDIAVOL: 113 mL (ref 62–150)
LVDIAVOLIN: 45 mL/m2
LVEEMED: 8.15
LVELAT: 11.4 cm/s
LVOT peak grad rest: 10 mmHg
LVOT peak vel: 160 cm/s
LVOTD: 23 mm
LVOTSV: 139 mL
Lateral S' vel: 10.6 cm/s
MV pk A vel: 59.1 m/s
MV pk E vel: 92.9 m/s
MVPG: 3 mmHg
PW: 10.4 mm — AB (ref 0.6–1.1)
Simpson's disk: 59
TAPSE: 18.8 mm
TDI e' lateral: 11.4
TDI e' medial: 6.85

## 2016-03-02 NOTE — Progress Notes (Signed)
*  PRELIMINARY RESULTS* Echocardiogram 2D Echocardiogram has been performed.  Samuel Germany 03/02/2016, 11:27 AM

## 2016-03-06 NOTE — Progress Notes (Signed)
Cardiology Office Note  Date: 03/09/2016   ID: [name], DOB [birth year], MRN [id number]  PCP: Alonza Bogus, MD  Primary Cardiologist: Rozann Lesches, MD   Chief Complaint  Patient presents with  . PAF    History of Present Illness: Jonathan Dyer is a 60 y.o. male  last seen in April. He presents for a routine follow-up visit. States that he enjoyed his summer, spent 3 months volunteering and ultimately working at a [employer] in [place]. He has had no palpitations or chest pain. Does report intermittent leg swelling, uses Lasix as needed. He did have a follow-up echocardiogram ordered recently by Dr. Luan Pulling. I reviewed the results.  CHADSVASC score is 2. Annual risk of stroke is approximately 2.3% on aspirin versus 0.8% on anticoagulant such as Eliquis. We discussed his risk for stroke and indications for anticoagulation.  We went over his medications. He remains on flecainide, no heart rate control medications with bradycardia at baseline.  Past Medical History:  Diagnosis Date  . Atrial fibrillation ([place])    Diagnosed 09/2009, on flecainide  . Bradycardia    Event monitor 2010: HR down to 45, asymptomatic.  [name] Coronary atherosclerosis of native coronary artery    Nonobstructive and distal disease by cath 2005.   [name] Essential hypertension, benign   . Hyperlipidemia   . OSA (obstructive sleep apnea)   . Sleep apnea     Past Surgical History:  Procedure Laterality Date  . COLONOSCOPY  05/2006   SLF: 3 mm sigmoid: Tubular adenoma removed.  [name] KNEE ARTHROSCOPY    . Left anterior cruciate ligament reconstruction    . Right shoulder surgery    . TONSILLECTOMY    . TOTAL KNEE ARTHROPLASTY      Current Outpatient Prescriptions  Medication Sig Dispense Refill  . amLODipine (NORVASC) 10 MG tablet Take 1 tablet by mouth Daily.    [name] aspirin 325 MG tablet Take 325 mg by mouth daily.      [name] b complex vitamins tablet Take 1 tablet by mouth daily.    .  flecainide (TAMBOCOR) 50 MG tablet Take 1 tablet by mouth  twice a day 180 tablet 3  . furosemide (LASIX) 40 MG tablet 40 mg daily as needed.     [name] losartan (COZAAR) 100 MG tablet Take 1 tablet by mouth Daily.    . Na Sulfate-K Sulfate-Mg Sulf (SUPREP BOWEL PREP) SOLN Take 1 kit by mouth as directed. 1 Bottle 0  . nitroGLYCERIN (NITROSTAT) 0.4 MG SL tablet Place 1 tablet (0.4 mg total) under the tongue every 5 (five) minutes as needed for chest pain (up to 3 doses). 25 tablet 2  . omeprazole (PRILOSEC) 20 MG capsule TAKE 1 CAPSULE BY MOUTH  DAILY 90 capsule 1  . pravastatin (PRAVACHOL) 40 MG tablet Take 40 mg by mouth at bedtime.     . tamsulosin (FLOMAX) 0.4 MG CAPS capsule Take 0.4 mg by mouth daily.      No current facility-administered medications for this visit.    Allergies:  Mycinette [phenol] and Penicillins   Social History: The patient  reports that he has never smoked. He has never used smokeless tobacco. He reports that he does not drink alcohol or use drugs.   ROS:  Please see the history of present illness. Otherwise, complete review of systems is positive for arthritic pains.  All other systems are reviewed and negative.   Physical Exam: VS:  BP 130/76   Pulse [name])  50   Ht _0  (1.88 m)   Wt 271 lb (122.9 kg)   SpO2 96%   BMI 34.79 kg/m , BMI Body mass index is 34.79 kg/m.  Wt Readings from Last 3 Encounters:  03/09/16 271 lb (122.9 kg)  09/06/15 257 lb (116.6 kg)  03/07/15 259 lb (117.5 kg)    Overweight male, appears comfortable at rest.  HEENT: Conjunctiva and lids normal, oral pharynx clear.  Neck: Supple, no carotid bruits or elevated JVP.  Lungs: Clear to auscultation, nonlabored.  Cardiac: Regular rate and rhythm, no S3 gallop.  Abdomen: Soft, nontender, bowel sounds present.  Extremities: No pitting edema, distal pulses full.  Musculoskeletal: No kyphosis. Skin: Warm and dry. Neuropsychiatric: Alert and oriented 3, affect appropriate.  ECG: I  personally reviewed the tracing from 09/06/2015 which showed sinus rhythm with QRS duration 120 ms.  Other Studies Reviewed Today:  Exercise echocardiogram 08/28/2011 [facility]): Negative for ischemia at 89% MPHR, no chest pain reported, no inducible wall motion abnormalities to indicate ischemia.  Echocardiogram 03/02/2016: Study Conclusions  - Left ventricle: The cavity size was normal. Systolic function was   normal. The estimated ejection fraction was in the range of 60%   to 65%. Wall motion was normal; there were no regional wall   motion abnormalities. Left ventricular diastolic function   parameters were normal. - Atrial septum: No defect or patent foramen ovale was identified.  Assessment and Plan:  1. Paroxysmal atrial fibrillation, symptomatically well controlled on flecainide. CHADSVASC score is 2, he continues to prefer aspirin to anticoagulation. We will continue to address this over time.  2. History of nonobstructive coronary atherosclerosis, no active angina symptoms. He is on aspirin and statin therapy.  3. Resting sinus bradycardia. He has not been on heart rate control medications with PAF.  4. Essential hypertension and intermittent leg edema. LVEF normal range by recent echocardiogram. Weight loss and sodium restriction discussed. He also has Lasix to use as needed.  Current medicines were reviewed with the patient today.  Disposition: Follow-up with me in 6 months.  Signed, Satira Sark, MD, [facility] 03/09/2016 9:49 AM    [facility] [place]. [address], [place], [place] [address] Phone: [phone]; Fax: [phone]

## 2016-03-07 ENCOUNTER — Other Ambulatory Visit: Payer: Self-pay | Admitting: Cardiology

## 2016-03-09 ENCOUNTER — Encounter: Payer: Self-pay | Admitting: Cardiology

## 2016-03-09 ENCOUNTER — Ambulatory Visit (INDEPENDENT_AMBULATORY_CARE_PROVIDER_SITE_OTHER): Payer: Medicare Other | Admitting: Cardiology

## 2016-03-09 VITALS — BP 130/76 | HR 50 | Ht 74.0 in | Wt 271.0 lb

## 2016-03-09 DIAGNOSIS — I2510 Atherosclerotic heart disease of native coronary artery without angina pectoris: Secondary | ICD-10-CM

## 2016-03-09 DIAGNOSIS — R001 Bradycardia, unspecified: Secondary | ICD-10-CM

## 2016-03-09 DIAGNOSIS — I480 Paroxysmal atrial fibrillation: Secondary | ICD-10-CM | POA: Diagnosis not present

## 2016-03-09 DIAGNOSIS — I10 Essential (primary) hypertension: Secondary | ICD-10-CM | POA: Diagnosis not present

## 2016-03-09 NOTE — Patient Instructions (Signed)
Your physician wants you to follow-up in: 6 months Dr McDowell You will receive a reminder letter in the mail two months in advance. If you don't receive a letter, please call our office to schedule the follow-up appointment.  Your physician recommends that you continue on your current medications as directed. Please refer to the Current Medication list given to you today.    If you need a refill on your cardiac medications before your next appointment, please call your pharmacy.       Thank you for choosing [facility] !         

## 2016-03-25 ENCOUNTER — Telehealth: Payer: Self-pay

## 2016-03-25 NOTE — Telephone Encounter (Signed)
Patient received letter to schedule tcs  [phone]

## 2016-03-27 NOTE — Telephone Encounter (Signed)
LMOM to call.

## 2016-04-06 ENCOUNTER — Telehealth: Payer: Self-pay

## 2016-04-06 NOTE — Telephone Encounter (Signed)
Triaged today.  

## 2016-04-08 NOTE — Telephone Encounter (Signed)
Gastroenterology Pre-Procedure Review  Request Date: 04/06/2016 Requesting Physician: Dr. Luan Pulling  PATIENT REVIEW QUESTIONS: The patient responded to the following health history questions as indicated:    1. Diabetes Melitis: no 2. Joint replacements in the past 12 months: no 3. Major health problems in the past 3 months: no 4. Has an artificial valve or MVP: no 5. Has a defibrillator: no 6. Has been advised in past to take antibiotics in advance of a procedure like teeth cleaning: YES, when he had left knee replacement iin 03/2009.   7. Family history of colon cancer: no  8. Alcohol Use: no 9. History of sleep apnea: Borderline, but could never use C-pap 10. History of coronary artery or other vascular stents placed within the last 12 months: no    MEDICATIONS & ALLERGIES:    Patient reports the following regarding taking any blood thinners:   Plavix? no Aspirin? no Coumadin? no Brilinta? no Xarelto? no Eliquis? no Pradaxa? no Savaysa? no Effient? no  Patient confirms/reports the following medications:  Current Outpatient Prescriptions  Medication Sig Dispense Refill  . amLODipine (NORVASC) 10 MG tablet Take 1 tablet by mouth Daily.    [name] aspirin 325 MG tablet Take 325 mg by mouth daily.      . flecainide (TAMBOCOR) 50 MG tablet Take 1 tablet by mouth  twice a day 180 tablet 3  . furosemide (LASIX) 40 MG tablet 40 mg daily as needed.     [name] losartan (COZAAR) 100 MG tablet Take 1 tablet by mouth Daily.    [name] omeprazole (PRILOSEC) 20 MG capsule TAKE 1 CAPSULE BY MOUTH  DAILY (Patient taking differently: TAKE 1 CAPSULE BY MOUTH  DAILY/ AS NEEDED ONLY) 90 capsule 1  . pravastatin (PRAVACHOL) 40 MG tablet Take 40 mg by mouth at bedtime.     . tamsulosin (FLOMAX) 0.4 MG CAPS capsule Take 0.4 mg by mouth daily.     [name] b complex vitamins tablet Take 1 tablet by mouth daily.    . Na Sulfate-K Sulfate-Mg Sulf (SUPREP BOWEL PREP) SOLN Take 1 kit by mouth as directed. 1 Bottle 0  .  nitroGLYCERIN (NITROSTAT) 0.4 MG SL tablet Place 1 tablet (0.4 mg total) under the tongue every 5 (five) minutes as needed for chest pain (up to 3 doses). (Patient not taking: Reported on 04/06/2016) 25 tablet 2   No current facility-administered medications for this visit.     Patient confirms/reports the following allergies:    No orders of the defined types were placed in this encounter.     SCHEDULE INFORMATION: Procedure has been scheduled as follows:  Date: 05/06/2016               Time: 9:30 AM  Location: [facility] Short Stay  This Gastroenterology Pre-Precedure Review Form is being routed to the following provider Dr. [name]

## 2016-04-08 NOTE — Telephone Encounter (Signed)

## 2016-04-09 ENCOUNTER — Other Ambulatory Visit: Payer: Self-pay

## 2016-04-09 DIAGNOSIS — Z1211 Encounter for screening for malignant neoplasm of colon: Secondary | ICD-10-CM

## 2016-04-09 MED ORDER — NA SULFATE-K SULFATE-MG SULF 17.5-3.13-1.6 GM/177ML PO SOLN: 1.0000 | ORAL | 0 refills | Status: DC

## 2016-04-09 NOTE — Telephone Encounter (Signed)
Rx sent to the pharmacy and instructions mailed to pt.  

## 2016-04-09 NOTE — Telephone Encounter (Signed)
NOTE: ALLERGIES GOT DELETED DUE TO VARIABLES LISTED ARE:  Jonathan Dyer.

## 2016-04-25 ENCOUNTER — Other Ambulatory Visit: Payer: Self-pay | Admitting: Cardiology

## 2016-04-28 ENCOUNTER — Telehealth: Payer: Self-pay

## 2016-04-28 NOTE — Telephone Encounter (Signed)
I called [employer] at [phone] and spoke to Vicente Males A who said a PA is not required for screening colonoscopy as out patient at [facility].

## 2016-05-06 ENCOUNTER — Telehealth: Payer: Self-pay

## 2016-05-06 ENCOUNTER — Encounter (HOSPITAL_COMMUNITY): Payer: Self-pay

## 2016-05-06 ENCOUNTER — Encounter (HOSPITAL_COMMUNITY)
Admission: RE | Disposition: A | Discharge status: Home / Self Care | Payer: Self-pay | Source: Ambulatory Visit | Attending: Gastroenterology

## 2016-05-06 ENCOUNTER — Other Ambulatory Visit: Payer: Self-pay

## 2016-05-06 ENCOUNTER — Ambulatory Visit (HOSPITAL_COMMUNITY)
Admission: RE | Admit: 2016-05-06 | Discharge: 2016-05-06 | Disposition: A | Discharge status: Home / Self Care | Payer: Medicare Other | Source: Ambulatory Visit | Attending: Gastroenterology | Admitting: Gastroenterology

## 2016-05-06 DIAGNOSIS — Z1212 Encounter for screening for malignant neoplasm of rectum: Secondary | ICD-10-CM

## 2016-05-06 DIAGNOSIS — I2510 Atherosclerotic heart disease of native coronary artery without angina pectoris: Secondary | ICD-10-CM | POA: Diagnosis not present

## 2016-05-06 DIAGNOSIS — I4891 Unspecified atrial fibrillation: Secondary | ICD-10-CM | POA: Diagnosis not present

## 2016-05-06 DIAGNOSIS — Z881 Allergy status to other antibiotic agents status: Secondary | ICD-10-CM | POA: Diagnosis not present

## 2016-05-06 DIAGNOSIS — Q438 Other specified congenital malformations of intestine: Secondary | ICD-10-CM | POA: Diagnosis not present

## 2016-05-06 DIAGNOSIS — Z836 Family history of other diseases of the respiratory system: Secondary | ICD-10-CM | POA: Insufficient documentation

## 2016-05-06 DIAGNOSIS — Z9889 Other specified postprocedural states: Secondary | ICD-10-CM | POA: Insufficient documentation

## 2016-05-06 DIAGNOSIS — Z86010 Personal history of colonic polyps: Secondary | ICD-10-CM | POA: Diagnosis not present

## 2016-05-06 DIAGNOSIS — Z8249 Family history of ischemic heart disease and other diseases of the circulatory system: Secondary | ICD-10-CM | POA: Insufficient documentation

## 2016-05-06 DIAGNOSIS — E785 Hyperlipidemia, unspecified: Secondary | ICD-10-CM | POA: Insufficient documentation

## 2016-05-06 DIAGNOSIS — K644 Residual hemorrhoidal skin tags: Secondary | ICD-10-CM | POA: Diagnosis not present

## 2016-05-06 DIAGNOSIS — D123 Benign neoplasm of transverse colon: Secondary | ICD-10-CM | POA: Diagnosis not present

## 2016-05-06 DIAGNOSIS — I10 Essential (primary) hypertension: Secondary | ICD-10-CM | POA: Diagnosis not present

## 2016-05-06 DIAGNOSIS — G4733 Obstructive sleep apnea (adult) (pediatric): Secondary | ICD-10-CM | POA: Diagnosis not present

## 2016-05-06 DIAGNOSIS — Z888 Allergy status to other drugs, medicaments and biological substances status: Secondary | ICD-10-CM | POA: Diagnosis not present

## 2016-05-06 DIAGNOSIS — Z1211 Encounter for screening for malignant neoplasm of colon: Secondary | ICD-10-CM

## 2016-05-06 DIAGNOSIS — Z8349 Family history of other endocrine, nutritional and metabolic diseases: Secondary | ICD-10-CM | POA: Diagnosis not present

## 2016-05-06 DIAGNOSIS — K648 Other hemorrhoids: Secondary | ICD-10-CM | POA: Diagnosis not present

## 2016-05-06 DIAGNOSIS — Z833 Family history of diabetes mellitus: Secondary | ICD-10-CM | POA: Insufficient documentation

## 2016-05-06 DIAGNOSIS — Z7982 Long term (current) use of aspirin: Secondary | ICD-10-CM | POA: Diagnosis not present

## 2016-05-06 HISTORY — PX: POLYPECTOMY: SHX5525

## 2016-05-06 HISTORY — PX: COLONOSCOPY: SHX5424

## 2016-05-06 SURGERY — COLONOSCOPY
Anesthesia: Moderate Sedation

## 2016-05-06 MED ORDER — MIDAZOLAM HCL 5 MG/5ML IJ SOLN
INTRAMUSCULAR | Status: AC
  Filled 2016-05-06: qty 10

## 2016-05-06 MED ORDER — MEPERIDINE HCL 100 MG/ML IJ SOLN
INTRAMUSCULAR | Status: DC | PRN
  Administered 2016-05-06: 50 mg via INTRAVENOUS
  Administered 2016-05-06: 25 mg via INTRAVENOUS

## 2016-05-06 MED ORDER — MIDAZOLAM HCL 5 MG/5ML IJ SOLN
INTRAMUSCULAR | Status: DC | PRN
Start: 2016-05-06 — End: 2016-05-06
  Administered 2016-05-06: 1 mg via INTRAVENOUS
  Administered 2016-05-06: 2 mg via INTRAVENOUS
  Administered 2016-05-06: 1 mg via INTRAVENOUS

## 2016-05-06 MED ORDER — SODIUM CHLORIDE 0.9 % IV SOLN
INTRAVENOUS | Status: DC
  Administered 2016-05-06: 09:00:00 via INTRAVENOUS

## 2016-05-06 MED ORDER — MEPERIDINE HCL 100 MG/ML IJ SOLN
INTRAMUSCULAR | Status: AC
  Filled 2016-05-06: qty 2

## 2016-05-06 NOTE — Discharge Instructions (Signed)
You had 2 polyps removed. You have internal AND EXTERNAL hemorrhoids.   CONTINUE YOUR WEIGHT LOSS EFFORTS. LOSE TEN POUNDS.  DRINK WATER TO KEEP YOUR URINE LIGHT YELLOW.  FOLLOW A HIGH FIBER DIET. AVOID ITEMS THAT CAUSE BLOATING & GAS. SEE INFO BELOW.  YOUR BIOPSY RESULTS WILL BE AVAILABLE IN MY CHART AFTER DEC 30 AND MY OFFICE WILL CONTACT YOU IN 10-14 DAYS WITH YOUR RESULTS.   Next colonoscopy in 5-10 years.    Colonoscopy Care After Read the instructions outlined below and refer to this sheet in the next week. These discharge instructions provide you with general information on caring for yourself after you leave the hospital. While your treatment has been planned according to the most current medical practices available, unavoidable complications occasionally occur. If you have any problems or questions after discharge, call DR. Consuela Widener, [phone].  ACTIVITY  You may resume your regular activity, but move at a slower pace for the next 24 hours.   Take frequent rest periods for the next 24 hours.   Walking will help get rid of the air and reduce the bloated feeling in your belly (abdomen).   No driving for 24 hours (because of the medicine (anesthesia) used during the test).   You may shower.   Do not sign any important legal documents or operate any machinery for 24 hours (because of the anesthesia used during the test).    NUTRITION  Drink plenty of fluids.   You may resume your normal diet as instructed by your doctor.   Begin with a light meal and progress to your normal diet. Heavy or fried foods are harder to digest and may make you feel sick to your stomach (nauseated).   Avoid alcoholic beverages for 24 hours or as instructed.    MEDICATIONS  You may resume your normal medications.   WHAT YOU CAN EXPECT TODAY  Some feelings of bloating in the abdomen.   Passage of more gas than usual.   Spotting of blood in your stool or on the toilet paper  .    IF YOU HAD POLYPS REMOVED DURING THE COLONOSCOPY:  Eat a soft diet IF YOU HAVE NAUSEA, BLOATING, ABDOMINAL PAIN, OR VOMITING.    FINDING OUT THE RESULTS OF YOUR TEST Not all test results are available during your visit. DR. [name] WILL CALL YOU WITHIN 14 DAYS OF YOUR PROCEDUE WITH YOUR RESULTS. Do not assume everything is normal if you have not heard from DR. Katleen Carraway, CALL HER OFFICE AT [phone].  SEEK IMMEDIATE MEDICAL ATTENTION AND CALL THE OFFICE: [phone] IF:  You have more than a spotting of blood in your stool.   Your belly is swollen (abdominal distention).   You are nauseated or vomiting.   You have a temperature over 101F.   You have abdominal pain or discomfort that is severe or gets worse throughout the day.   High-Fiber Diet A high-fiber diet changes your normal diet to include more whole grains, legumes, fruits, and vegetables. Changes in the diet involve replacing refined carbohydrates with unrefined foods. The calorie level of the diet is essentially unchanged. The Dietary Reference Intake (recommended amount) for adult males is 38 grams per day. For adult females, it is 25 grams per day. Pregnant and lactating women should consume 28 grams of fiber per day. Fiber is the intact part of a plant that is not broken down during digestion. Functional fiber is fiber that has been isolated from the plant to provide a beneficial effect  in the body. PURPOSE  Increase stool bulk.   Ease and regulate bowel movements.   Lower cholesterol.   REDUCE RISK OF COLON CANCER  INDICATIONS THAT YOU NEED MORE FIBER  Constipation and hemorrhoids.   Uncomplicated diverticulosis (intestine condition) and irritable bowel syndrome.   Weight management.   As a protective measure against hardening of the arteries (atherosclerosis), diabetes, and cancer.   GUIDELINES FOR INCREASING FIBER IN THE DIET  Start adding fiber to the diet slowly. A gradual increase of about 5 more  grams (2 slices of whole-wheat bread, 2 servings of most fruits or vegetables, or 1 bowl of high-fiber cereal) per day is best. Too rapid an increase in fiber may result in constipation, flatulence, and bloating.   Drink enough water and fluids to keep your urine clear or pale yellow. Water, juice, or caffeine-free drinks are recommended. Not drinking enough fluid may cause constipation.   Eat a variety of high-fiber foods rather than one type of fiber.   Try to increase your intake of fiber through using high-fiber foods rather than fiber pills or supplements that contain small amounts of fiber.   The goal is to change the types of food eaten. Do not supplement your present diet with high-fiber foods, but replace foods in your present diet.   INCLUDE A VARIETY OF FIBER SOURCES  Replace refined and processed grains with whole grains, canned fruits with fresh fruits, and incorporate other fiber sources. White rice, white breads, and most bakery goods contain little or no fiber.   Brown whole-grain rice, buckwheat oats, and many fruits and vegetables are all good sources of fiber. These include: broccoli, Brussels sprouts, cabbage, cauliflower, beets, sweet potatoes, white potatoes (skin on), carrots, tomatoes, eggplant, squash, berries, fresh fruits, and dried fruits.   Cereals appear to be the richest source of fiber. Cereal fiber is found in whole grains and bran. Bran is the fiber-rich outer coat of cereal grain, which is largely removed in refining. In whole-grain cereals, the bran remains. In breakfast cereals, the largest amount of fiber is found in those with "bran" in their names. The fiber content is sometimes indicated on the label.   You may need to include additional fruits and vegetables each day.   In baking, for 1 cup white flour, you may use the following substitutions:   1 cup whole-wheat flour minus 2 tablespoons.   1/2 cup white flour plus 1/2 cup whole-wheat flour.    Polyps, Colon  A polyp is extra tissue that grows inside your body. Colon polyps grow in the large intestine. The large intestine, also called the colon, is part of your digestive system. It is a long, hollow tube at the end of your digestive tract where your body makes and stores stool. Most polyps are not dangerous. They are benign. This means they are not cancerous. But over time, some types of polyps can turn into cancer. Polyps that are smaller than a pea are usually not harmful. But larger polyps could someday become or may already be cancerous. To be safe, doctors remove all polyps and test them.   WHO GETS POLYPS? Anyone can get polyps, but certain people are more likely than others. You may have a greater chance of getting polyps if:  You are over 50.   You have had polyps before.   Someone in your family has had polyps.   Someone in your family has had cancer of the large intestine.   Find out if someone  in your family has had polyps. You may also be more likely to get polyps if you:   Eat a lot of fatty foods   Smoke   Drink alcohol   Do not exercise  Eat too much   PREVENTION There is not one sure way to prevent polyps. You might be able to lower your risk of getting them if you:  Eat more fruits and vegetables and less fatty food.   Do not smoke.   Avoid alcohol.   Exercise every day.   Lose weight if you are overweight.   Eating more calcium and folate can also lower your risk of getting polyps. Some foods that are rich in calcium are milk, cheese, and broccoli. Some foods that are rich in folate are chickpeas, kidney beans, and spinach.   Hemorrhoids Hemorrhoids are dilated (enlarged) veins around the rectum. Sometimes clots will form in the veins. This makes them swollen and painful. These are called thrombosed hemorrhoids. Causes of hemorrhoids include:  Constipation.   Straining to have a bowel movement.   HEAVY LIFTING  HOME CARE  INSTRUCTIONS  Eat a well balanced diet and drink 6 to 8 glasses of water every day to avoid constipation. You may also use a bulk laxative.   Avoid straining to have bowel movements.   Keep anal area dry and clean.   Do not use a donut shaped pillow or sit on the toilet for long periods. This increases blood pooling and pain.   Move your bowels when your body has the urge; this will require less straining and will decrease pain and pressure.

## 2016-05-06 NOTE — H&P (Signed)
Primary Care Physician:  Alonza Bogus, MD Primary Gastroenterologist:  Dr. [name]  Pre-Procedure History & Physical: HPI:  EVELYN KUYPER is a 60 y.o. male here for [place].  Past Medical History:  Diagnosis Date  . Atrial fibrillation ([place])    Diagnosed 09/2009, on flecainide  . Bradycardia    Event monitor 2010: HR down to 45, asymptomatic.  [name] Coronary atherosclerosis of native coronary artery    Nonobstructive and distal disease by cath 2005.   [name] Essential hypertension, benign   . Hyperlipidemia   . OSA (obstructive sleep apnea)   . Sleep apnea     Past Surgical History:  Procedure Laterality Date  . COLONOSCOPY  05/2006   SLF: 3 mm sigmoid: Tubular adenoma removed.  [name] KNEE ARTHROSCOPY    . Left anterior cruciate ligament reconstruction    . Right shoulder surgery    . TONSILLECTOMY    . TOTAL KNEE ARTHROPLASTY      Prior to Admission medications   Medication Sig Start Date End Date Taking? Authorizing Provider  amLODipine (NORVASC) 10 MG tablet Take 1 tablet by mouth Daily. 04/09/12  Yes Historical Provider, MD  aspirin 325 MG tablet Take 325 mg by mouth daily.     Yes Historical Provider, MD  flecainide (TAMBOCOR) 50 MG tablet TAKE 1 TABLET BY MOUTH  TWICE A DAY 04/27/16  Yes Satira Sark, MD  furosemide (LASIX) 40 MG tablet Take 40 mg by mouth daily as needed for fluid.  08/18/13  Yes Historical Provider, MD  losartan (COZAAR) 100 MG tablet Take 1 tablet by mouth Daily. 04/09/12  Yes Historical Provider, MD  omeprazole (PRILOSEC) 20 MG capsule TAKE 1 CAPSULE BY MOUTH  DAILY Patient taking differently: TAKE 1 CAPSULE BY MOUTH  DAILY/ AS NEEDED ONLY 03/09/16  Yes Satira Sark, MD  pravastatin (PRAVACHOL) 40 MG tablet Take 40 mg by mouth at bedtime.  08/11/11  Yes Historical Provider, MD  tamsulosin (FLOMAX) 0.4 MG CAPS capsule Take 0.4 mg by mouth daily as needed.  10/03/13  Yes Historical Provider, MD    Allergies as of 04/09/2016 - Review  Complete 04/08/2016  Allergen Reaction Noted  . Mycinette [phenol]  10/07/2012  . Penicillins      Family History  Problem Relation Age of Onset  . Pulmonary embolism Mother   . Lung disease Father   . Hyperlipidemia Brother   . Hypertension Brother   . Diabetes Other     Significant family h/o DM  . Colon cancer Neg Hx     Social History   Social History  . Marital status: Divorced    Spouse name: N/A  . Number of children: 3  . Years of education: N/A   Occupational History  . PT-custodial work    Social History Main Topics  . Smoking status: Never Smoker  . Smokeless tobacco: Never Used  . Alcohol use No  . Drug use: No  . Sexual activity: Not on file   Other Topics Concern  . Not on file   Social History Narrative  . No narrative on file    Review of Systems: See HPI, otherwise negative ROS   Physical Exam: BP (!) 159/88   Pulse 71   Temp 98.2 F (36.8 C) (Oral)   Resp 15   Ht 6\' 3"  (1.905 m)   Wt 270 lb (122.5 kg)   SpO2 96%   BMI 33.75 kg/m  General:   Alert,  pleasant and cooperative in  NAD Head:  Normocephalic and atraumatic. Neck:  Supple; Lungs:  Clear throughout to auscultation.    Heart:  Regular rate and rhythm. Abdomen:  Soft, nontender and nondistended. Normal bowel sounds, without guarding, and without rebound.   Neurologic:  Alert and  oriented x4;  grossly normal neurologically.  Impression/Plan:     SCREENING  Plan:  1. TCS TODAY. DISCUSSED PROCEDURE, BENEFITS, & RISKS: < 1% chance of medication reaction, bleeding, perforation, or rupture of spleen/liver.

## 2016-05-06 NOTE — Op Note (Signed)
[facility] Patient Name: Jonathan Dyer Procedure Date: 05/06/2016 9:55 AM MRN: [id number] Date of Birth: [birth year] Attending MD: Barney Drain , MD CSN: [id number] Age: 60 Admit Type: Outpatient Procedure:                Colonoscopy with COLD FORCEPS POLYPECTOMY Indications:              High risk colon cancer surveillance: Personal                            history of colonic polyps Providers:                Barney Drain, MD, Lurline Del, RN, Isabella Stalling,                            Technician Referring MD:             Jasper Loser. Luan Pulling, MD Medicines:                Meperidine 75 mg IV, Midazolam 4 mg IV Complications:            No immediate complications. Estimated Blood Loss:     Estimated blood loss was minimal. Procedure:                Pre-Anesthesia Assessment:                           - Prior to the procedure, a History and Physical                            was performed, and patient medications and                            allergies were reviewed. The patient's tolerance of                            previous anesthesia was also reviewed. The risks                            and benefits of the procedure and the sedation                            options and risks were discussed with the patient.                            All questions were answered, and informed consent                            was obtained. Prior Anticoagulants: The patient has                            taken aspirin, last dose was day of procedure. ASA                            Grade Assessment: II - A patient with mild systemic  disease. After reviewing the risks and benefits,                            the patient was deemed in satisfactory condition to                            undergo the procedure. After obtaining informed                            consent, the colonoscope was passed under direct                            vision. Throughout the  procedure, the patient's                            blood pressure, pulse, and oxygen saturations were                            monitored continuously. The [id number]) was                            introduced through the anus and advanced to the the                            cecum, identified by appendiceal orifice and                            ileocecal valve. The ileocecal valve, appendiceal                            orifice, and rectum were photographed. The                            colonoscopy was somewhat difficult due to a                            tortuous colon. Successful completion of the                            procedure was aided by increasing the dose of                            sedation medication and COLOWRAP. The patient                            tolerated the procedure fairly well. The quality of                            the bowel preparation was excellent. Scope In: 10:18:40 AM Scope Out: 10:42:23 AM Scope Withdrawal Time: 0 hours 13 minutes 55 seconds  Total Procedure Duration: 0 hours 23 minutes 43 seconds  Findings:      Two sessile polyps were found in the mid transverse colon and hepatic       flexure.  The polyps were 2 to 4 mm in size. These polyps were removed       with a cold biopsy forceps. Resection and retrieval were complete.      The recto-sigmoid colon and sigmoid colon were moderately tortuous.      External and internal hemorrhoids were found. The hemorrhoids were       moderate. Impression:               - Two 2 to 4 mm polyps in the mid transverse colon                            and at the hepatic flexure, removed with a cold                            biopsy forceps. Resected and retrieved.                           - Tortuous left colon.                           - External and internal hemorrhoids. Moderate Sedation:      Moderate (conscious) sedation was administered by the endoscopy nurse       and supervised by the  endoscopist. The following parameters were       monitored: oxygen saturation, heart rate, blood pressure, and response       to care. Total physician intraservice time was 36 minutes. Recommendation:           - High fiber diet.                           - Continue present medications.                           - Await pathology results.                           - Repeat colonoscopy in 5-10 years for surveillance.                           - Patient has a contact number available for                            emergencies. The signs and symptoms of potential                            delayed complications were discussed with the                            patient. Return to normal activities tomorrow.                            Written discharge instructions were provided to the                            patient. Procedure Code(s):        --- Professional ---  [id number], Colonoscopy, flexible; with biopsy, single                            or multiple                           99152, Moderate sedation services provided by the                            same physician or other qualified health care                            professional performing the diagnostic or                            therapeutic service that the sedation supports,                            requiring the presence of an independent trained                            observer to assist in the monitoring of the                            patient's level of consciousness and physiological                            status; initial 15 minutes of intraservice time,                            patient age 14 years or older                           [phone], Moderate sedation services; each additional                            15 minutes intraservice time Diagnosis Code(s):        --- Professional ---                           Z86.010, Personal history of colonic polyps                            D12.3, Benign neoplasm of transverse colon (hepatic                            flexure or splenic flexure)                           K64.8, Other hemorrhoids                           Q43.8, Other specified congenital malformations of                            intestine CPT copyright 2016  American Medical Association. All rights reserved. The codes documented in this report are preliminary and upon coder review may  be revised to meet current compliance requirements. Barney Drain, MD Barney Drain, MD 05/06/2016 11:06:54 AM This report has been signed electronically. Number of Addenda: 0

## 2016-05-06 NOTE — Telephone Encounter (Signed)
Pt called office. He had TCS done this morning. He requested instructions/results mailed to him. After visit summary mailed to pt.

## 2016-05-07 ENCOUNTER — Telehealth: Payer: Self-pay | Admitting: Gastroenterology

## 2016-05-07 ENCOUNTER — Encounter (HOSPITAL_COMMUNITY): Payer: Self-pay | Admitting: Gastroenterology

## 2016-05-07 NOTE — Telephone Encounter (Signed)
Pt is aware, and would like for me to send him a copy of these instructions and I am putting in the mail.

## 2016-05-07 NOTE — Telephone Encounter (Signed)
Please call pt. HE had TWO simple adenomas removed.   CONTINUE YOUR WEIGHT LOSS EFFORTS. LOSE TEN POUNDS.  DRINK WATER TO KEEP YOUR URINE LIGHT YELLOW.  FOLLOW A HIGH FIBER DIET. AVOID ITEMS THAT CAUSE BLOATING & GAS.   Next colonoscopy in 5-10 years.

## 2016-07-03 ENCOUNTER — Other Ambulatory Visit (HOSPITAL_COMMUNITY): Payer: Self-pay | Admitting: Pulmonary Disease

## 2016-07-03 ENCOUNTER — Ambulatory Visit (HOSPITAL_COMMUNITY)
Admission: RE | Admit: 2016-07-03 | Discharge: 2016-07-03 | Disposition: A | Discharge status: Home / Self Care | Payer: Medicare Other | Source: Ambulatory Visit | Attending: Pulmonary Disease | Admitting: Pulmonary Disease

## 2016-07-03 DIAGNOSIS — M25552 Pain in left hip: Secondary | ICD-10-CM | POA: Diagnosis not present

## 2016-07-03 DIAGNOSIS — R52 Pain, unspecified: Secondary | ICD-10-CM

## 2016-07-03 DIAGNOSIS — M25551 Pain in right hip: Secondary | ICD-10-CM | POA: Diagnosis not present

## 2016-07-03 DIAGNOSIS — I5030 Unspecified diastolic (congestive) heart failure: Secondary | ICD-10-CM | POA: Diagnosis not present

## 2016-07-03 DIAGNOSIS — E785 Hyperlipidemia, unspecified: Secondary | ICD-10-CM | POA: Diagnosis not present

## 2016-07-03 DIAGNOSIS — I25119 Atherosclerotic heart disease of native coronary artery with unspecified angina pectoris: Secondary | ICD-10-CM | POA: Diagnosis not present

## 2016-07-06 DIAGNOSIS — I5030 Unspecified diastolic (congestive) heart failure: Secondary | ICD-10-CM | POA: Diagnosis not present

## 2016-07-06 DIAGNOSIS — E785 Hyperlipidemia, unspecified: Secondary | ICD-10-CM | POA: Diagnosis not present

## 2016-07-06 DIAGNOSIS — I25119 Atherosclerotic heart disease of native coronary artery with unspecified angina pectoris: Secondary | ICD-10-CM | POA: Diagnosis not present

## 2016-07-06 DIAGNOSIS — I10 Essential (primary) hypertension: Secondary | ICD-10-CM | POA: Diagnosis not present

## 2016-07-09 DIAGNOSIS — M545 Low back pain: Secondary | ICD-10-CM | POA: Diagnosis not present

## 2016-07-09 DIAGNOSIS — I10 Essential (primary) hypertension: Secondary | ICD-10-CM | POA: Diagnosis not present

## 2016-07-09 DIAGNOSIS — I2510 Atherosclerotic heart disease of native coronary artery without angina pectoris: Secondary | ICD-10-CM | POA: Diagnosis not present

## 2016-07-29 DIAGNOSIS — M542 Cervicalgia: Secondary | ICD-10-CM | POA: Diagnosis not present

## 2016-07-29 DIAGNOSIS — I10 Essential (primary) hypertension: Secondary | ICD-10-CM | POA: Diagnosis not present

## 2016-07-29 DIAGNOSIS — M545 Low back pain: Secondary | ICD-10-CM | POA: Diagnosis not present

## 2016-07-29 DIAGNOSIS — I25119 Atherosclerotic heart disease of native coronary artery with unspecified angina pectoris: Secondary | ICD-10-CM | POA: Diagnosis not present

## 2016-08-27 ENCOUNTER — Ambulatory Visit: Payer: Medicare Other | Admitting: Cardiology

## 2016-08-27 DIAGNOSIS — M1711 Unilateral primary osteoarthritis, right knee: Secondary | ICD-10-CM | POA: Diagnosis not present

## 2016-08-31 DIAGNOSIS — I25119 Atherosclerotic heart disease of native coronary artery with unspecified angina pectoris: Secondary | ICD-10-CM | POA: Diagnosis not present

## 2016-08-31 DIAGNOSIS — I480 Paroxysmal atrial fibrillation: Secondary | ICD-10-CM | POA: Diagnosis not present

## 2016-08-31 DIAGNOSIS — M159 Polyosteoarthritis, unspecified: Secondary | ICD-10-CM | POA: Diagnosis not present

## 2016-08-31 DIAGNOSIS — M545 Low back pain: Secondary | ICD-10-CM | POA: Diagnosis not present

## 2016-10-21 ENCOUNTER — Telehealth: Payer: Self-pay | Admitting: Cardiology

## 2016-10-21 NOTE — Telephone Encounter (Signed)
Returned patient call. He was complaining of having some shortness of breath and fatigue  while working last week. [place] He has not had any chest pain or pressure, but has noticed that his legs have been swelling a little more. He has been taking his lasix 40 mg daily for the past 5 days. He does feel better today but was wanting to let his doctors know what was going on. He states that he normally only has to take a lasix twice a month. He is not on any potassium supplement and is working out in the heat all day and he is wondering if he could be dehydrated. Please advise.

## 2016-10-21 NOTE — Telephone Encounter (Signed)
Please call patient regarding symptoms he is having / tg

## 2016-10-21 NOTE — Telephone Encounter (Signed)
It is possible that some of his symptoms could be related to being at a higher elevation, particularly fatigue and shortness of breath. This would not necessarily explain the need to use extra Lasix, and he could have risk for dehydration if he has been working in the heat and sweating more than normal. Probably the best way to understand this would be for him to be seen at a facility near where he is working so that his blood pressure and heart rate can be checked, perhaps even blood work related to renal function and potassium would be useful. This is not something that we will be easily determined or managed over the phone from a distance however.

## 2016-10-22 NOTE — Telephone Encounter (Signed)
Spoke with pt, advised him to be evaluated by a doctor while out of town working to be sure his blood pressure and heart rate are normal. Also advised him that he may need lab work to check renal function. He voiced understanding.

## 2016-10-26 ENCOUNTER — Telehealth: Payer: Self-pay | Admitting: Cardiology

## 2016-10-26 NOTE — Telephone Encounter (Signed)
Spoke with pt, advised him to be evaluated locally. He agreed that he would if he got any worse. He does plan on returning home this week rather than staying there to work throughout the summer.

## 2016-10-26 NOTE — Telephone Encounter (Signed)
I'm concerned about his progressive symptoms and feel that he should be evaluated.  I would advise that he is seen locally (wherever he is currently) as I am concerned that driving for three days may only serve to exacerbate symptoms.

## 2016-10-26 NOTE — Telephone Encounter (Signed)
Pt would like to speak w/ Lattie Haw, he has a couple of questions to ask from their previous conversation. He can be reached @ [phone], states he is out of town and his cell service is come and go.

## 2016-10-26 NOTE — Telephone Encounter (Signed)
Returned pt call, and he complains of having exertional SOB, mild chest pains (nagging ache) that comes and goes. He has continued to have to take daily lasix to keep his legs from swelling. He also stated that he has had a few "extended flutters" along with his chest pain. He is working 1700 miles away from home and has been very tired for past 2 weeks. He is wondering if he needs to come home to be evaluated. He does not want go to the ER or urgent care where he is working. He states it is a 3 day drive but will come home if it is advised.

## 2016-11-02 ENCOUNTER — Other Ambulatory Visit (HOSPITAL_COMMUNITY)
Admission: RE | Admit: 2016-11-02 | Discharge: 2016-11-02 | Disposition: A | Discharge status: Home / Self Care | Payer: Medicare Other | Source: Ambulatory Visit | Attending: Adult Health | Admitting: Adult Health

## 2016-11-02 ENCOUNTER — Ambulatory Visit (HOSPITAL_COMMUNITY)
Admission: RE | Admit: 2016-11-02 | Discharge: 2016-11-02 | Disposition: A | Discharge status: Home / Self Care | Payer: Medicare Other | Source: Ambulatory Visit | Attending: Adult Health | Admitting: Adult Health

## 2016-11-02 ENCOUNTER — Ambulatory Visit (INDEPENDENT_AMBULATORY_CARE_PROVIDER_SITE_OTHER): Payer: Medicare Other | Admitting: Adult Health

## 2016-11-02 ENCOUNTER — Encounter: Payer: Self-pay | Admitting: Adult Health

## 2016-11-02 VITALS — BP 116/66 | HR 60 | Ht 74.0 in | Wt 272.0 lb

## 2016-11-02 DIAGNOSIS — I700 Atherosclerosis of aorta: Secondary | ICD-10-CM | POA: Diagnosis not present

## 2016-11-02 DIAGNOSIS — I5032 Chronic diastolic (congestive) heart failure: Secondary | ICD-10-CM | POA: Diagnosis not present

## 2016-11-02 DIAGNOSIS — R079 Chest pain, unspecified: Secondary | ICD-10-CM

## 2016-11-02 DIAGNOSIS — R0602 Shortness of breath: Secondary | ICD-10-CM

## 2016-11-02 DIAGNOSIS — I480 Paroxysmal atrial fibrillation: Secondary | ICD-10-CM

## 2016-11-02 LAB — COMPREHENSIVE METABOLIC PANEL WITH GFR
ALT: 17 U/L (ref 17–63)
AST: 19 U/L (ref 15–41)
Albumin: 4 g/dL (ref 3.5–5.0)
Alkaline Phosphatase: 83 U/L (ref 38–126)
Anion gap: 8 (ref 5–15)
BUN: 13 mg/dL (ref 6–20)
CO2: 24 mmol/L (ref 22–32)
Calcium: 8.6 mg/dL — ABNORMAL LOW (ref 8.9–10.3)
Chloride: 104 mmol/L (ref 101–111)
Creatinine, Ser: 1.24 mg/dL (ref 0.61–1.24)
GFR calc Af Amer: >60 mL/min
GFR calc non Af Amer: >60 mL/min
Glucose, Bld: 106 mg/dL — ABNORMAL HIGH (ref 65–99)
Potassium: 3.6 mmol/L (ref 3.5–5.1)
Sodium: 136 mmol/L (ref 135–145)
Total Bilirubin: 0.8 mg/dL (ref 0.3–1.2)
Total Protein: 7.1 g/dL (ref 6.5–8.1)

## 2016-11-02 LAB — BRAIN NATRIURETIC PEPTIDE: B Natriuretic Peptide: 31 pg/mL (ref 0.0–100.0)

## 2016-11-02 LAB — CBC
HCT: 42.7 % (ref 39.0–52.0)
Hemoglobin: 15.1 g/dL (ref 13.0–17.0)
MCH: 30.6 pg (ref 26.0–34.0)
MCHC: 35.4 g/dL (ref 30.0–36.0)
MCV: 86.6 fL (ref 78.0–100.0)
Platelets: 229 K/uL (ref 150–400)
RBC: 4.93 MIL/uL (ref 4.22–5.81)
RDW: 12.2 % (ref 11.5–15.5)
WBC: 6.6 K/uL (ref 4.0–10.5)

## 2016-11-02 NOTE — Patient Instructions (Signed)
Your physician recommends that you schedule a follow-up appointment in:  2 weeks    Get chest x-ray now   Get lab work now    Your physician recommends that you continue on your current medications as directed. Please refer to the Current Medication list given to you today.     Thank you for choosing [facility] !

## 2016-11-02 NOTE — Progress Notes (Signed)
[facility] Office Note   Date:  11/02/2016   ID:  Klein, Willcox 1955-06-26, MRN [id number]  PCP:  Sinda Du, MD  Cardiologist:  Domenic Polite  Chief Complaint  Patient presents with  . Congestive Heart Failure  . Atrial Fibrillation      History of Present Illness: Jonathan Dyer is a 61 y.o. male who presents for ongoing assessment and management of atrial fibrillation, CAD.obstructive disease by catheterization in 2005, hypertension, hyperlipidemia, history obstructive sleep apnea. We will last seen by Dr. Domenic Polite on 03/09/2016. The patient refuses anticoagulation, preferred to stay on aspirin.   The patient called our office on 10/26/2016 for complaints of shortness of breath. He was out of town and wanted to be treated over the phone with advisement based upon his symptoms. He was advised to be seen locally at a hospital where he was working. Main complaint was dyspnea.  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 The patient states that when he was there, he was at a 5000 feet elevation. He states when he was walking his dog he had worsening shortness of breath. He also noticed that he began to have some fluid retention with lower extremity edema and abdominal distention.  The patient took a dose of Lasix which she only takes when necessary for fluid retention, and began to have relief of lower extremity edema symptoms and improvement in his breathing status. The patient took the Lasix 4 days in a row. The patient also had intermittent irregular heart rhythm, some racing, while he was exerting himself. He also had some intermittent chest pain.  Although advised by our office to seek medical attention, he chose not to. He states he did not want to have a full workup at a local clinic or be transferred to [place] and hospitalized. He treated himself. He admits to a proximately 80 pound weight gain since working  in [place]. He had been on a strict diet prior to that, that got off of his diet and did not adhere to low-sodium diet while away.  The patient states that the lower extremity edema, chest discomfort and palpitations have resolved since he came home. He still has some minor fluid retention and has had a higher weight than he normally carries. Prior to leaving for [facility] his weight was 20 and 60 pounds.  Past Medical History:  Diagnosis Date  . Atrial fibrillation ([place])    Diagnosed 09/2009, on flecainide  . Bradycardia    Event monitor 2010: HR down to 45, asymptomatic.  [name] Coronary atherosclerosis of native coronary artery    Nonobstructive and distal disease by cath 2005.   [name] Essential hypertension, benign   . Hyperlipidemia   . OSA (obstructive sleep apnea)   . Sleep apnea     Past Surgical History:  Procedure Laterality Date  . COLONOSCOPY  05/2006   SLF: 3 mm sigmoid: Tubular adenoma removed.  . COLONOSCOPY N/A 05/06/2016   Procedure: COLONOSCOPY;  Surgeon: [name], MD;  [facility];  Service: Endoscopy;  Laterality: N/A;  9:30 Am  . KNEE ARTHROSCOPY    . Left anterior cruciate ligament reconstruction    . POLYPECTOMY  05/06/2016   Procedure: POLYPECTOMY;  Surgeon: [name], MD;  [facility];  Service: Endoscopy;;  transverse colon polyp, hepatic flexure polyp,   . Right shoulder surgery    . TONSILLECTOMY    .  TOTAL KNEE ARTHROPLASTY       Current Outpatient Prescriptions  Medication Sig Dispense Refill  . amLODipine (NORVASC) 10 MG tablet Take 1 tablet by mouth Daily.    [name] aspirin 325 MG tablet Take 325 mg by mouth daily.      . flecainide (TAMBOCOR) 50 MG tablet TAKE 1 TABLET BY MOUTH  TWICE A DAY 180 tablet 3  . furosemide (LASIX) 40 MG tablet Take 40 mg by mouth daily as needed for fluid.     [name] losartan (COZAAR) 100 MG tablet Take 1 tablet by mouth Daily.    . Magnesium Oxide 250 MG TABS Take 250 mg by mouth daily.    .  Omega-3 Fatty Acids (FISH OIL) 1000 MG CAPS Take 1,000 mg by mouth daily.    [name] omeprazole (PRILOSEC) 20 MG capsule TAKE 1 CAPSULE BY MOUTH  DAILY (Patient taking differently: TAKE 1 CAPSULE BY MOUTH  DAILY/ AS NEEDED ONLY) 90 capsule 1  . pravastatin (PRAVACHOL) 40 MG tablet Take 40 mg by mouth at bedtime.     . tamsulosin (FLOMAX) 0.4 MG CAPS capsule Take 0.4 mg by mouth daily as needed.      No current facility-administered medications for this visit.     Allergies:   Mycinette [phenol] and Penicillins    Social History:  The patient  reports that he has never smoked. He has never used smokeless tobacco. He reports that he does not drink alcohol or use drugs.   Family History:  The patient's family history includes Diabetes in his other; Hyperlipidemia in his brother; Hypertension in his brother; Lung disease in his father; Pulmonary embolism in his mother.    ROS: All other systems are reviewed and negative. Unless otherwise mentioned in H&P    PHYSICAL EXAM: VS:  BP 116/66   Pulse 60   Ht 6\' 2"  (1.88 m)   Wt 272 lb (123.4 kg)   SpO2 94%   BMI 34.92 kg/m  , BMI Body mass index is 34.92 kg/m. GEN: Well nourished, well developed, in no acute distress  HEENT: normal  Neck: no JVD, carotid bruits, or masses Cardiac: RRR; bradycardic, no murmurs, rubs, or gallops,no edema  Respiratory:  clear to auscultation bilaterally, normal work of breathing GI: soft, nontender, nondistended, + BS MS: no deformity or atrophy, 1+-2= pitting edema in the dependent position.  Skin: warm and dry, no rash Neuro:  Strength and sensation are intact Psych: euthymic mood, full affect   EKG:  Sinus bradycardia. Rate of 57 bpm.  Recent Labs: 11/02/2016: ALT 17; B Natriuretic Peptide 31.0; BUN 13; Creatinine, Ser 1.24; Hemoglobin 15.1; Platelets 229; Potassium 3.6; Sodium 136    Lipid Panel No results found for: CHOL, TRIG, HDL, CHOLHDL, VLDL, LDLCALC, LDLDIRECT    Wt Readings from Last 3  Encounters:  11/02/16 272 lb (123.4 kg)  05/06/16 270 lb (122.5 kg)  03/09/16 271 lb (122.9 kg)      Other studies Reviewed:   ASSESSMENT AND PLAN:  1. Paroxysmal atrial fibrillation: EKG today revealed sinus bradycardia. The patient has self-reported rapid irregular heart rate will at high elevation in [facility] while walking his dog. This has resolved since returning home. He currently refuses anticoagulation therapy "unless I need it". I will order a BMET to evaluate for kidney function, low potassium or magnesium causing rapid heart rhythm and irregular rhythm. Currently we'll keep him on aspirin.  2. History of diastolic heart failure: He has expressed symptoms which appear to  be related to this while out of town. He took 40 mg of Lasix for 4 days to help with lower extremity edema and mild shortness of breath. He bought some over-the-counter potassium and 8 bananas. The patient refused medical attention at that time. He states the Lasix has helped him and he felt better after several days. He has gained some weight but he has been off of his diet. Baseline weight was 260 pounds prior to going out of town now 272 pounds.  Due to his symptoms, I will check a chest x-ray BNP and a CBC to evaluate reasons for fluid retention. He will need to have a repeat echocardiogram once labs are completed to evaluate for changes in LV systolic function.  3. Hypertension: Blood pressures currently well-controlled. He will continue on amlodipine 10 mg daily Lasix when necessary and losartan 100 mg daily. Labs pending.     Current medicines are reviewed at length with the patient today.    Labs/ tests ordered today include: BMET, BNP, CBC, and chest x-ray  Phill Myron. West Pugh, ANP, AACC   11/02/2016 5:04 PM    [facility] [address] [place], [place] [address] Phone: [phone]; Fax: [phone]

## 2016-11-16 ENCOUNTER — Encounter: Payer: Self-pay | Admitting: Cardiology

## 2016-11-16 ENCOUNTER — Ambulatory Visit (INDEPENDENT_AMBULATORY_CARE_PROVIDER_SITE_OTHER): Payer: Medicare Other | Admitting: Cardiology

## 2016-11-16 VITALS — BP 140/82 | HR 57 | Ht 74.0 in | Wt 274.0 lb

## 2016-11-16 DIAGNOSIS — I10 Essential (primary) hypertension: Secondary | ICD-10-CM

## 2016-11-16 DIAGNOSIS — R600 Localized edema: Secondary | ICD-10-CM

## 2016-11-16 DIAGNOSIS — I2510 Atherosclerotic heart disease of native coronary artery without angina pectoris: Secondary | ICD-10-CM | POA: Diagnosis not present

## 2016-11-16 DIAGNOSIS — I480 Paroxysmal atrial fibrillation: Secondary | ICD-10-CM | POA: Diagnosis not present

## 2016-11-16 NOTE — Progress Notes (Signed)
Cardiology Office Note  Date: 11/16/2016   ID: Jonathan Dyer, DOB December 04, 1955, MRN [id number]  PCP: Sinda Du, MD  Primary Cardiologist: Rozann Lesches, MD   Chief Complaint  Patient presents with  . PAF    History of Present Illness: Jonathan Dyer is a 61 y.o. male seen recently by Ms. West Pugh in late June.He presents for a follow-up visit, states that he continues to feel better with near resolution of lower leg edema, no recurring chest pain or palpitations. He tells me that when he was out [place] he was eating a lot more salt and drinking soft drinks which could have contributed, I also suspect the high altitude was playing a role.  He is riding his bike, went out 3 times last week. He does not report any recurring exertional chest pain or palpitations. We reviewed his medications and he states that he has been compliant. I reviewed his recent ECG.  He has not had an echocardiogram since last year and we discussed obtaining a follow-up study. I did review his recent lab work.  Past Medical History:  Diagnosis Date  . Atrial fibrillation ([place])    Diagnosed 09/2009, on flecainide  . Bradycardia    Event monitor 2010: HR down to 45, asymptomatic.  [name] Coronary atherosclerosis of native coronary artery    Nonobstructive and distal disease by cath 2005.   [name] Essential hypertension, benign   . Hyperlipidemia   . OSA (obstructive sleep apnea)   . Sleep apnea     Past Surgical History:  Procedure Laterality Date  . COLONOSCOPY  05/2006   SLF: 3 mm sigmoid: Tubular adenoma removed.  . COLONOSCOPY N/A 05/06/2016   Procedure: COLONOSCOPY;  Surgeon: [name], MD;  [facility];  Service: Endoscopy;  Laterality: N/A;  9:30 Am  . KNEE ARTHROSCOPY    . Left anterior cruciate ligament reconstruction    . POLYPECTOMY  05/06/2016   Procedure: POLYPECTOMY;  Surgeon: [name], MD;  [facility];  Service: Endoscopy;;  transverse colon polyp,  hepatic flexure polyp,   . Right shoulder surgery    . TONSILLECTOMY    . TOTAL KNEE ARTHROPLASTY      Current Outpatient Prescriptions  Medication Sig Dispense Refill  . amLODipine (NORVASC) 10 MG tablet Take 1 tablet by mouth Daily.    [name] aspirin 325 MG tablet Take 325 mg by mouth daily.      . flecainide (TAMBOCOR) 50 MG tablet TAKE 1 TABLET BY MOUTH  TWICE A DAY 180 tablet 3  . furosemide (LASIX) 40 MG tablet Take 40 mg by mouth daily as needed for fluid.     [name] losartan (COZAAR) 100 MG tablet Take 1 tablet by mouth Daily.    . Magnesium Oxide 250 MG TABS Take 250 mg by mouth daily.    . Omega-3 Fatty Acids (FISH OIL) 1000 MG CAPS Take 1,000 mg by mouth daily.    [name] omeprazole (PRILOSEC) 20 MG capsule TAKE 1 CAPSULE BY MOUTH  DAILY (Patient taking differently: TAKE 1 CAPSULE BY MOUTH  DAILY/ AS NEEDED ONLY) 90 capsule 1  . pravastatin (PRAVACHOL) 40 MG tablet Take 40 mg by mouth at bedtime.     . tamsulosin (FLOMAX) 0.4 MG CAPS capsule Take 0.4 mg by mouth daily as needed.      No current facility-administered medications for this visit.    Allergies:  Mycinette [phenol] and Penicillins   Social History: The patient  reports that he has never smoked. He has never used smokeless tobacco. He reports that he does not drink alcohol or use drugs.   ROS:  Please see the history of present illness. Otherwise, complete review of systems is positive for none.  All other systems are reviewed and negative.   Physical Exam: VS:  BP 140/82   Pulse (!) 57   Ht 6\' 2"  (1.88 m)   Wt 274 lb (124.3 kg)   SpO2 95%   BMI 35.18 kg/m , BMI Body mass index is 35.18 kg/m.  Wt Readings from Last 3 Encounters:  11/16/16 274 lb (124.3 kg)  11/02/16 272 lb (123.4 kg)  05/06/16 270 lb (122.5 kg)    General: Tall obese male, appears comfortable at rest. HEENT: Conjunctiva and lids normal, oropharynx clear. Neck: Supple, no elevated JVP or carotid bruits, no thyromegaly. Lungs: Clear to auscultation,  nonlabored breathing at rest. Cardiac: Regular rate and rhythm, no S3 or significant systolic murmur, no pericardial rub. Abdomen: Soft, nontender, bowel sounds present. Extremities: Mild lower leg edema, distal pulses 2+. Skin: Warm and dry. Musculoskeletal: No kyphosis. Neuropsychiatric: Alert and oriented x3, affect grossly appropriate.  ECG: I personally reviewed the tracing from 11/02/2016 which showed sinus bradycardia with normal intervals.  Recent Labwork: 11/02/2016: ALT 17; AST 19; B Natriuretic Peptide 31.0; BUN 13; Creatinine, Ser 1.24; Hemoglobin 15.1; Platelets 229; Potassium 3.6; Sodium 136   Other Studies Reviewed Today:  Echocardiogram 03/02/2016: Study Conclusions  - Left ventricle: The cavity size was normal. Systolic function was   normal. The estimated ejection fraction was in the range of 60%   to 65%. Wall motion was normal; there were no regional wall   motion abnormalities. Left ventricular diastolic function   parameters were normal. - Atrial septum: No defect or patent foramen ovale was identified.  Assessment and Plan:  1. Paroxysmal atrial fibrillation. He has done very well in terms of rhythm control on flecainide. He prefers staying on aspirin, has not wanted to pursue anticoagulation as yet. CHADSVASC score is 2. I reviewed his recent ECG.  2. Intermittent leg edema. Some of this sounds to have been complicated by increased sodium intake. We discussed obtaining a follow-up echocardiogram however to ensure stability in LVEF. We discussed exercise, salt restriction.  3. History of mild coronary atherosclerosis, nonobstructive. No recurring chest pain with activity. I asked him to be mindful for any changes in stamina or symptoms at recurring with his regular exercise plan.  4. History of hypertension, no changes made present regimen.  Current medicines were reviewed with the patient today.   Orders Placed This Encounter  Procedures  .  ECHOCARDIOGRAM COMPLETE    Disposition: Follow-up in 6 months, sooner if needed.  Signed, Satira Sark, MD, [facility] 11/16/2016 12:10 PM    [facility] at [facility] [address] [place],  [address] Phone: [phone]; Fax: [phone]

## 2016-11-16 NOTE — Patient Instructions (Signed)
Your physician wants you to follow-up in:  6 months with Dr.McDowell You will receive a reminder letter in the mail two months in advance. If you don't receive a letter, please call our office to schedule the follow-up appointment.    Your physician has requested that you have an echocardiogram. Echocardiography is a painless test that uses sound waves to create images of your heart. It provides your doctor with information about the size and shape of your heart and how well your heart's chambers and valves are working. This procedure takes approximately one hour. There are no restrictions for this procedure.    Your physician recommends that you continue on your current medications as directed. Please refer to the Current Medication list given to you today.    If you need a refill on your cardiac medications before your next appointment, please call your pharmacy.      Thank you for choosing [facility] !        

## 2016-11-18 ENCOUNTER — Ambulatory Visit (HOSPITAL_COMMUNITY)
Admission: RE | Admit: 2016-11-18 | Discharge: 2016-11-18 | Disposition: A | Discharge status: Home / Self Care | Payer: Medicare Other | Source: Ambulatory Visit | Attending: Cardiology | Admitting: Cardiology

## 2016-11-18 DIAGNOSIS — I480 Paroxysmal atrial fibrillation: Secondary | ICD-10-CM | POA: Insufficient documentation

## 2016-11-18 DIAGNOSIS — I700 Atherosclerosis of aorta: Secondary | ICD-10-CM | POA: Diagnosis not present

## 2016-11-18 LAB — ECHOCARDIOGRAM COMPLETE
CHL CUP DOP CALC LVOT VTI: 30.1 cm
CHL CUP TV REG PEAK VELOCITY: 192 cm/s
E/e' ratio: 7.28
EWDT: 225 ms
FS: 36 % (ref 28–44)
IV/PV OW: 0.95
LA ID, A-P, ES: 27 mm
LA diam end sys: 27 mm
LA diam index: 1.04 cm/m2
LA vol index: 33 mL/m2
LA vol: 85.4 mL
LAVOLA4C: 66.5 mL
LV E/e'average: 7.28
LV PW d: 11.7 mm — AB (ref 0.6–1.1)
LV SIMPSON'S DISK: 60
LV TDI E'LATERAL: 10.9
LV TDI E'MEDIAL: 7.07
LV dias vol index: 44 mL/m2
LV sys vol index: 18 mL/m2
LV sys vol: 46 mL
LVDIAVOL: 115 mL (ref 62–150)
LVEEMED: 7.28
LVELAT: 10.9 cm/s
LVOT SV: 114 mL
LVOT area: 3.8 cm2
LVOT peak grad rest: 7 mmHg
LVOTD: 22 mm
LVOTPV: 134 cm/s
MV Dec: 225
MV Peak grad: 3 mmHg
MV pk E vel: 79.3 m/s
MVPKAVEL: 49.9 m/s
RV LATERAL S' VELOCITY: 9.46 cm/s
RV sys press: 18 mmHg
Stroke v: 69 ml
TAPSE: 17.5 mm
TRMAXVEL: 192 cm/s

## 2016-11-18 NOTE — Progress Notes (Signed)
*  PRELIMINARY RESULTS* Echocardiogram 2D Echocardiogram has been performed.  Jonathan Dyer 11/18/2016, 11:02 AM

## 2017-03-02 DIAGNOSIS — I480 Paroxysmal atrial fibrillation: Secondary | ICD-10-CM | POA: Diagnosis not present

## 2017-03-02 DIAGNOSIS — I5030 Unspecified diastolic (congestive) heart failure: Secondary | ICD-10-CM | POA: Diagnosis not present

## 2017-03-02 DIAGNOSIS — M545 Low back pain: Secondary | ICD-10-CM | POA: Diagnosis not present

## 2017-03-02 DIAGNOSIS — Z23 Encounter for immunization: Secondary | ICD-10-CM | POA: Diagnosis not present

## 2017-03-02 DIAGNOSIS — I119 Hypertensive heart disease without heart failure: Secondary | ICD-10-CM | POA: Diagnosis not present

## 2017-03-04 DIAGNOSIS — I5030 Unspecified diastolic (congestive) heart failure: Secondary | ICD-10-CM | POA: Diagnosis not present

## 2017-03-04 DIAGNOSIS — M545 Low back pain: Secondary | ICD-10-CM | POA: Diagnosis not present

## 2017-03-04 DIAGNOSIS — I119 Hypertensive heart disease without heart failure: Secondary | ICD-10-CM | POA: Diagnosis not present

## 2017-03-04 DIAGNOSIS — I480 Paroxysmal atrial fibrillation: Secondary | ICD-10-CM | POA: Diagnosis not present

## 2017-03-17 DIAGNOSIS — L570 Actinic keratosis: Secondary | ICD-10-CM | POA: Diagnosis not present

## 2017-03-17 DIAGNOSIS — D485 Neoplasm of uncertain behavior of skin: Secondary | ICD-10-CM | POA: Diagnosis not present

## 2017-03-17 DIAGNOSIS — L814 Other melanin hyperpigmentation: Secondary | ICD-10-CM | POA: Diagnosis not present

## 2017-03-17 DIAGNOSIS — L918 Other hypertrophic disorders of the skin: Secondary | ICD-10-CM | POA: Diagnosis not present

## 2017-03-17 DIAGNOSIS — L986 Other infiltrative disorders of the skin and subcutaneous tissue: Secondary | ICD-10-CM | POA: Diagnosis not present

## 2017-03-17 DIAGNOSIS — L821 Other seborrheic keratosis: Secondary | ICD-10-CM | POA: Diagnosis not present

## 2017-03-17 DIAGNOSIS — D1801 Hemangioma of skin and subcutaneous tissue: Secondary | ICD-10-CM | POA: Diagnosis not present

## 2017-03-22 ENCOUNTER — Other Ambulatory Visit: Payer: Self-pay | Admitting: Cardiology

## 2017-04-20 DIAGNOSIS — I480 Paroxysmal atrial fibrillation: Secondary | ICD-10-CM | POA: Diagnosis not present

## 2017-04-20 DIAGNOSIS — M545 Low back pain: Secondary | ICD-10-CM | POA: Diagnosis not present

## 2017-04-20 DIAGNOSIS — I25119 Atherosclerotic heart disease of native coronary artery with unspecified angina pectoris: Secondary | ICD-10-CM | POA: Diagnosis not present

## 2017-04-29 ENCOUNTER — Other Ambulatory Visit: Payer: Self-pay | Admitting: Cardiology

## 2017-05-24 NOTE — Progress Notes (Signed)
Cardiology Office Note  Date: 05/26/2017   ID: Jonathan Dyer, DOB 10-10-1955, MRN [id number]  PCP: Sinda Du, MD  Primary Cardiologist: Rozann Lesches, MD   Chief Complaint  Patient presents with  . PAF    History of Present Illness: Jonathan Dyer is a 62 y.o. male last seen in July 2018. He presents for a routine follow-up visit. States that he had an episode of recurrent palpitations back around Thanksgiving, discomfort in his chest that time as well. Since then he has not had any further symptoms. He reports compliance with his medications including flecainide. He continues to prefer aspirin to anticoagulation. CHADSVASC score is 2.  Follow-up echocardiogram from July 2018 showed LVEF 55-60% with normal diastolic function. He has had trouble with intermittent leg swelling. He is trying to diet and lose some weight, cutting back carbohydrates. He also remains on Lasix.  I personally reviewed his ECG today which shows normal sinus rhythm.  He had a negative GXT in 2016.  Past Medical History:  Diagnosis Date  . Atrial fibrillation ([place])    Diagnosed 09/2009, on flecainide  . Bradycardia    Event monitor 2010: HR down to 45, asymptomatic.  [name] Coronary atherosclerosis of native coronary artery    Nonobstructive and distal disease by cath 2005.   [name] Essential hypertension, benign   . Hyperlipidemia   . OSA (obstructive sleep apnea)   . Sleep apnea     Past Surgical History:  Procedure Laterality Date  . COLONOSCOPY  05/2006   SLF: 3 mm sigmoid: Tubular adenoma removed.  . COLONOSCOPY N/A 05/06/2016   Procedure: COLONOSCOPY;  Surgeon: [name], MD;  [facility];  Service: Endoscopy;  Laterality: N/A;  9:30 Am  . KNEE ARTHROSCOPY    . Left anterior cruciate ligament reconstruction    . POLYPECTOMY  05/06/2016   Procedure: POLYPECTOMY;  Surgeon: [name], MD;  [facility];  Service: Endoscopy;;  transverse colon polyp, hepatic  flexure polyp,   . Right shoulder surgery    . TONSILLECTOMY    . TOTAL KNEE ARTHROPLASTY      Current Outpatient Medications  Medication Sig Dispense Refill  . amLODipine (NORVASC) 10 MG tablet Take 1 tablet by mouth Daily.    [name] aspirin 325 MG tablet Take 325 mg by mouth daily.      . baclofen (LIORESAL) 20 MG tablet Take 20 mg by mouth daily as needed for muscle spasms.    . flecainide (TAMBOCOR) 50 MG tablet TAKE 1 TABLET BY MOUTH  TWICE A DAY 180 tablet 3  . furosemide (LASIX) 40 MG tablet Take 40 mg by mouth daily as needed for fluid.     [name] losartan (COZAAR) 100 MG tablet Take 1 tablet by mouth Daily.    . Magnesium Oxide 250 MG TABS Take 250 mg by mouth daily.    . methylPREDNISolone (MEDROL DOSEPAK) 4 MG TBPK tablet as directed.     . Omega-3 Fatty Acids (FISH OIL) 1000 MG CAPS Take 1,000 mg by mouth daily. [employer] Triple Strength Fish Oil    . omeprazole (PRILOSEC) 20 MG capsule TAKE 1 CAPSULE BY MOUTH  DAILY 90 capsule 3  . pravastatin (PRAVACHOL) 40 MG tablet Take 40 mg by mouth at bedtime.     . tamsulosin (FLOMAX) 0.4 MG CAPS capsule Take 0.4 mg by mouth daily as needed.      No current facility-administered medications for this visit.    Allergies:  Mycinette [phenol] and Penicillins   Social History: The patient  reports that  has never smoked. he has never used smokeless tobacco. He reports that he does not drink alcohol or use drugs.   ROS:  Please see the history of present illness. Otherwise, complete review of systems is positive for lower back pain and leg pain.  All other systems are reviewed and negative.   Physical Exam: VS:  BP 138/74   Pulse 68   Ht 6\' 2"  (1.88 m)   Wt 290 lb 3.2 oz (131.6 kg)   SpO2 94%   BMI 37.26 kg/m , BMI Body mass index is 37.26 kg/m.  Wt Readings from Last 3 Encounters:  05/26/17 290 lb 3.2 oz (131.6 kg)  11/16/16 274 lb (124.3 kg)  11/02/16 272 lb (123.4 kg)    General: Patient appears comfortable at rest. HEENT: Conjunctiva  and lids normal, oropharynx clear. Neck: Supple, no elevated JVP or carotid bruits, no thyromegaly. Lungs: Clear to auscultation, nonlabored breathing at rest. Cardiac: Regular rate and rhythm, no S3 or significant systolic murmur, no pericardial rub. Abdomen: Soft, nontender, bowel sounds present. Extremities: Mild ankle edema, distal pulses 2+. Skin: Warm and dry. Musculoskeletal: No kyphosis. Neuropsychiatric: Alert and oriented x3, affect grossly appropriate.  ECG: I personally reviewed the tracing from 11/02/2016 which showed sinus bradycardia.  Recent Labwork: 11/02/2016: ALT 17; AST 19; B Natriuretic Peptide 31.0; BUN 13; Creatinine, Ser 1.24; Hemoglobin 15.1; Platelets 229; Potassium 3.6; Sodium 136   Other Studies Reviewed Today:  Echocardiogram 11/18/2016: Study Conclusions  - Left ventricle: The cavity size was normal. Wall thickness was   increased in a pattern of mild LVH. Systolic function was normal.   The estimated ejection fraction was in the range of 55% to 60%.   Wall motion was normal; there were no regional wall motion   abnormalities. Left ventricular diastolic function parameters   were normal. - Aortic valve: Mildly calcified annulus. Trileaflet; mildly   thickened leaflets. Valve area (VTI): 3.42 cm^2. Valve area   (Vmax): 3.19 cm^2. Valve area (Vmean): 2.93 cm^2. - Mitral valve: Mildly calcified annulus. Mildly thickened leaflets. - Technically adequate study.   Assessment and Plan:  1. Paroxysmal atrial fibrillation with CHADSVASC score of 2. He continues to prefer aspirin to anticoagulation. He reported some palpitations back around November 2018 but nothing recently. His ECG is normal today. Continue with current plan and less symptoms escalate.   2. Previously documented mild coronary atherosclerosis with negative GXT in 2016. ECG normal today. Continue aspirin and Pravachol.  3. Essential hypertension, no changes to current regimen. He is working on  diet and weight loss as well.  4. History of leg edema, presently not an active issue. Echocardiogram from July 2018 showed normal LVEF and diastolic function.  Current medicines were reviewed with the patient today.  Disposition: Follow-up in 6 months.  Signed, Satira Sark, MD, [facility] 05/26/2017 2:28 PM    [facility] [place], [place], [place] [address] Phone: [phone]; Fax: [phone]

## 2017-05-26 ENCOUNTER — Ambulatory Visit: Payer: Medicare Other | Admitting: Cardiology

## 2017-05-26 ENCOUNTER — Encounter: Payer: Self-pay | Admitting: Cardiology

## 2017-05-26 VITALS — BP 138/74 | HR 68 | Ht 74.0 in | Wt 290.2 lb

## 2017-05-26 DIAGNOSIS — I10 Essential (primary) hypertension: Secondary | ICD-10-CM

## 2017-05-26 DIAGNOSIS — I480 Paroxysmal atrial fibrillation: Secondary | ICD-10-CM | POA: Diagnosis not present

## 2017-05-26 DIAGNOSIS — R600 Localized edema: Secondary | ICD-10-CM | POA: Diagnosis not present

## 2017-05-26 DIAGNOSIS — I2510 Atherosclerotic heart disease of native coronary artery without angina pectoris: Secondary | ICD-10-CM | POA: Diagnosis not present

## 2017-05-26 NOTE — Patient Instructions (Signed)

## 2017-05-26 NOTE — Addendum Note (Signed)
Addended by: Laurine Blazer on: 05/26/2017 04:41 PM   Modules accepted: Orders

## 2017-06-17 DIAGNOSIS — H5203 Hypermetropia, bilateral: Secondary | ICD-10-CM | POA: Diagnosis not present

## 2017-07-19 DIAGNOSIS — M545 Low back pain: Secondary | ICD-10-CM | POA: Diagnosis not present

## 2017-07-19 DIAGNOSIS — M47816 Spondylosis without myelopathy or radiculopathy, lumbar region: Secondary | ICD-10-CM | POA: Diagnosis not present

## 2017-07-19 DIAGNOSIS — M9903 Segmental and somatic dysfunction of lumbar region: Secondary | ICD-10-CM | POA: Diagnosis not present

## 2017-07-20 DIAGNOSIS — M4800 Spinal stenosis, site unspecified: Secondary | ICD-10-CM | POA: Insufficient documentation

## 2017-07-20 DIAGNOSIS — M545 Low back pain: Secondary | ICD-10-CM | POA: Diagnosis not present

## 2017-07-20 DIAGNOSIS — M1711 Unilateral primary osteoarthritis, right knee: Secondary | ICD-10-CM | POA: Insufficient documentation

## 2017-07-21 DIAGNOSIS — M47816 Spondylosis without myelopathy or radiculopathy, lumbar region: Secondary | ICD-10-CM | POA: Diagnosis not present

## 2017-07-21 DIAGNOSIS — M9903 Segmental and somatic dysfunction of lumbar region: Secondary | ICD-10-CM | POA: Diagnosis not present

## 2017-07-21 DIAGNOSIS — M545 Low back pain: Secondary | ICD-10-CM | POA: Diagnosis not present

## 2017-07-28 DIAGNOSIS — M545 Low back pain: Secondary | ICD-10-CM | POA: Diagnosis not present

## 2017-08-04 DIAGNOSIS — M51369 Other intervertebral disc degeneration, lumbar region without mention of lumbar back pain or lower extremity pain: Secondary | ICD-10-CM | POA: Insufficient documentation

## 2017-08-04 DIAGNOSIS — M5136 Other intervertebral disc degeneration, lumbar region: Secondary | ICD-10-CM | POA: Diagnosis not present

## 2017-08-05 ENCOUNTER — Ambulatory Visit (HOSPITAL_COMMUNITY)
Admission: RE | Admit: 2017-08-05 | Discharge: 2017-08-05 | Disposition: A | Discharge status: Home / Self Care | Payer: Medicare Other | Source: Ambulatory Visit | Attending: Pulmonary Disease | Admitting: Pulmonary Disease

## 2017-08-05 ENCOUNTER — Other Ambulatory Visit (HOSPITAL_COMMUNITY): Payer: Self-pay | Admitting: Pulmonary Disease

## 2017-08-05 DIAGNOSIS — M545 Low back pain: Secondary | ICD-10-CM | POA: Diagnosis not present

## 2017-08-05 DIAGNOSIS — M7731 Calcaneal spur, right foot: Secondary | ICD-10-CM | POA: Diagnosis not present

## 2017-08-05 DIAGNOSIS — M79673 Pain in unspecified foot: Secondary | ICD-10-CM | POA: Diagnosis not present

## 2017-08-05 DIAGNOSIS — R52 Pain, unspecified: Secondary | ICD-10-CM | POA: Insufficient documentation

## 2017-08-05 DIAGNOSIS — M79671 Pain in right foot: Secondary | ICD-10-CM | POA: Diagnosis not present

## 2017-08-09 ENCOUNTER — Encounter: Payer: Self-pay | Admitting: Family Medicine

## 2017-08-09 DIAGNOSIS — M79671 Pain in right foot: Secondary | ICD-10-CM | POA: Diagnosis not present

## 2017-08-12 DIAGNOSIS — M79671 Pain in right foot: Secondary | ICD-10-CM | POA: Diagnosis not present

## 2017-08-12 DIAGNOSIS — I10 Essential (primary) hypertension: Secondary | ICD-10-CM | POA: Diagnosis not present

## 2017-08-12 DIAGNOSIS — M545 Low back pain: Secondary | ICD-10-CM | POA: Diagnosis not present

## 2017-08-12 DIAGNOSIS — I25119 Atherosclerotic heart disease of native coronary artery with unspecified angina pectoris: Secondary | ICD-10-CM | POA: Diagnosis not present

## 2017-08-17 DIAGNOSIS — M25571 Pain in right ankle and joints of right foot: Secondary | ICD-10-CM | POA: Insufficient documentation

## 2017-08-19 DIAGNOSIS — M5136 Other intervertebral disc degeneration, lumbar region: Secondary | ICD-10-CM | POA: Diagnosis not present

## 2017-08-20 ENCOUNTER — Other Ambulatory Visit: Payer: Self-pay | Admitting: Urology

## 2017-08-20 DIAGNOSIS — N281 Cyst of kidney, acquired: Secondary | ICD-10-CM

## 2017-08-20 DIAGNOSIS — N401 Enlarged prostate with lower urinary tract symptoms: Secondary | ICD-10-CM | POA: Diagnosis not present

## 2017-08-26 ENCOUNTER — Ambulatory Visit (HOSPITAL_COMMUNITY)
Admission: RE | Admit: 2017-08-26 | Discharge: 2017-08-26 | Disposition: A | Discharge status: Home / Self Care | Payer: Medicare Other | Source: Ambulatory Visit | Attending: Urology | Admitting: Urology

## 2017-08-26 DIAGNOSIS — K760 Fatty (change of) liver, not elsewhere classified: Secondary | ICD-10-CM | POA: Diagnosis not present

## 2017-08-26 DIAGNOSIS — K862 Cyst of pancreas: Secondary | ICD-10-CM | POA: Insufficient documentation

## 2017-08-26 DIAGNOSIS — N281 Cyst of kidney, acquired: Secondary | ICD-10-CM | POA: Insufficient documentation

## 2017-08-26 LAB — POCT I-STAT CREATININE: CREATININE: 1.2 mg/dL (ref 0.61–1.24)

## 2017-08-26 MED ORDER — GADOBENATE DIMEGLUMINE 529 MG/ML IV SOLN
20.0000 mL | Freq: Once | INTRAVENOUS | Status: AC | PRN
  Administered 2017-08-26: 20 mL via INTRAVENOUS

## 2017-09-07 DIAGNOSIS — N401 Enlarged prostate with lower urinary tract symptoms: Secondary | ICD-10-CM | POA: Diagnosis not present

## 2017-09-07 DIAGNOSIS — N281 Cyst of kidney, acquired: Secondary | ICD-10-CM | POA: Diagnosis not present

## 2017-10-11 DIAGNOSIS — I480 Paroxysmal atrial fibrillation: Secondary | ICD-10-CM | POA: Diagnosis not present

## 2017-10-11 DIAGNOSIS — I25119 Atherosclerotic heart disease of native coronary artery with unspecified angina pectoris: Secondary | ICD-10-CM | POA: Diagnosis not present

## 2017-10-11 DIAGNOSIS — I5030 Unspecified diastolic (congestive) heart failure: Secondary | ICD-10-CM | POA: Diagnosis not present

## 2017-10-11 DIAGNOSIS — I10 Essential (primary) hypertension: Secondary | ICD-10-CM | POA: Diagnosis not present

## 2017-10-13 DIAGNOSIS — M779 Enthesopathy, unspecified: Secondary | ICD-10-CM | POA: Diagnosis not present

## 2017-10-13 DIAGNOSIS — M79672 Pain in left foot: Secondary | ICD-10-CM | POA: Diagnosis not present

## 2017-12-29 DIAGNOSIS — R197 Diarrhea, unspecified: Secondary | ICD-10-CM | POA: Diagnosis not present

## 2017-12-29 DIAGNOSIS — R1011 Right upper quadrant pain: Secondary | ICD-10-CM | POA: Diagnosis not present

## 2017-12-29 DIAGNOSIS — R109 Unspecified abdominal pain: Secondary | ICD-10-CM | POA: Diagnosis not present

## 2017-12-29 DIAGNOSIS — R1012 Left upper quadrant pain: Secondary | ICD-10-CM | POA: Diagnosis not present

## 2017-12-29 DIAGNOSIS — Z1211 Encounter for screening for malignant neoplasm of colon: Secondary | ICD-10-CM

## 2017-12-30 ENCOUNTER — Other Ambulatory Visit (HOSPITAL_COMMUNITY): Payer: Self-pay | Admitting: Pulmonary Disease

## 2017-12-30 DIAGNOSIS — K862 Cyst of pancreas: Secondary | ICD-10-CM

## 2018-01-03 ENCOUNTER — Ambulatory Visit: Payer: Medicare Other | Admitting: Gastroenterology

## 2018-01-03 ENCOUNTER — Telehealth: Payer: Self-pay | Admitting: *Deleted

## 2018-01-03 ENCOUNTER — Encounter: Payer: Self-pay | Admitting: Gastroenterology

## 2018-01-03 ENCOUNTER — Encounter (HOSPITAL_COMMUNITY): Payer: Self-pay

## 2018-01-03 ENCOUNTER — Other Ambulatory Visit: Payer: Self-pay | Admitting: *Deleted

## 2018-01-03 ENCOUNTER — Encounter (HOSPITAL_COMMUNITY)
Admission: RE | Admit: 2018-01-03 | Discharge: 2018-01-03 | Disposition: A | Discharge status: Home / Self Care | Payer: Medicare Other | Source: Ambulatory Visit | Attending: Gastroenterology | Admitting: Gastroenterology

## 2018-01-03 DIAGNOSIS — K862 Cyst of pancreas: Secondary | ICD-10-CM | POA: Diagnosis not present

## 2018-01-03 DIAGNOSIS — K639 Disease of intestine, unspecified: Secondary | ICD-10-CM | POA: Diagnosis not present

## 2018-01-03 DIAGNOSIS — I5030 Unspecified diastolic (congestive) heart failure: Secondary | ICD-10-CM | POA: Diagnosis not present

## 2018-01-03 DIAGNOSIS — I25119 Atherosclerotic heart disease of native coronary artery with unspecified angina pectoris: Secondary | ICD-10-CM | POA: Diagnosis not present

## 2018-01-03 DIAGNOSIS — K6389 Other specified diseases of intestine: Secondary | ICD-10-CM

## 2018-01-03 DIAGNOSIS — R109 Unspecified abdominal pain: Secondary | ICD-10-CM | POA: Diagnosis not present

## 2018-01-03 MED ORDER — CLENPIQ 10-3.5-12 MG-GM -GM/160ML PO SOLN: 1.0000 | Freq: Once | ORAL | 0 refills | Status: DC

## 2018-01-03 NOTE — Patient Instructions (Signed)
1. I will call you this afternoon once I review CT images with radiologist.  2. For now, drink CLEAR liquids only.     Clear Liquid Diet A clear liquid diet means that you only have liquids that you can see through. You do not eat any food on this diet. Most people need to follow this diet for only a short time. What do I need to know about this diet?  A clear liquid is a liquid that you can see through when you hold it up to a light.  This diet does not give you all the nutrients that you need. Choose a variety of the liquids that your doctor says you can drink on this diet. That way, you will get as many nutrients as possible.  If you are not sure whether you can have certain items, ask your doctor. What can I have?  Water and flavored water.  Fruit juices that do not have pulp, such as cranberry juice and apple juice.  Tea and coffee without milk or cream.  Clear bouillon or broth.  Broth-based soups that have been strained.  Flavored gelatins.  Honey.  Sugar water.  Frozen ice or frozen ice pops that do not have any milk, yogurt, fruit pieces, or fruit pulp in them.  Clear sodas.  Clear sports drinks. The items listed above may not be a complete list of recommended liquids. Contact your food and nutrition expert (dietitian) for more options. What can I not have?  Juices that have pulp.  Milk.  Cream or cream-based soups.  Yogurt. The items listed above may not be a complete list of liquids to avoid. Contact your food and nutrition expert for more information. Summary  A clear liquid diet is a diet that includes only liquids that you can see through.  The goal of this diet is to help you recover.  Make sure to avoid liquids with milk, cream, or pulp while you are on this diet. This information is not intended to replace advice given to you by your health care provider. Make sure you discuss any questions you have with your health care provider. Document  Released: 04/09/2008 Document Revised: 12/09/2015 Document Reviewed: 03/24/2013 Elsevier Interactive Patient Education  2017 [employer].

## 2018-01-03 NOTE — Telephone Encounter (Signed)
Per LSL, okay to scheduled [facility] W/ SLF tomorrow (see addendum note). I have sent prep/instructions to the pharmacy. I called [facility] and they do have clenpiq in stock and they did receive the instructions I faxed over to them.

## 2018-01-03 NOTE — Progress Notes (Signed)
CC'D TO PCP °

## 2018-01-03 NOTE — Progress Notes (Addendum)
Primary Care Physician:  Sinda Du, MD  Primary Gastroenterologist:  Barney Drain, MD  REVIEWED-NO ADDITIONAL RECOMMENDATIONS.  Chief Complaint  Patient presents with  . obstruction    colon    HPI:  Jonathan Dyer is a 62 y.o. male here for urgent same day appointment. Received a phone call from Dr. Luan Pulling regarding patient having cecal mass on recent imaging.  Patient has been working in [employer] in [place] over the summer. Typically does 5 months at a time each year. He had been feeling well until end of July. He started having intermittent upper abdominal pain, mild in nature. Last two weeks symptoms worsened. One week ago he felt achy, feverish, abdominal pain worsened. Radiating down the right side of abdomen to RLQ.  He was seen at urgent care and had extensive work up including labs, chest/abd xray, abd u/s as outlined below. Concern for possible appendicitis so CT obtained. CT abdomen pelvis with contrast significant for mass involving the cecum of the colon with marked thickening and irregularity of the bowel wall.  Suspicious for neoplasm.  Small lymph nodes in the ileocolonic distribution area, less than 1 cm in size but prominent.  Also noted bilateral renal cysts, largest cyst on the left measuring 12 x 11 cm and extending 13 cm craniocaudal dimension.  Largest right cyst measuring 8.5 x 6.4 cm.  Cyst are followed by Dr. Alyson Ingles with plan for surgery large left renal cyst in October.  Patient states he was given the option of coming home to have cecal mass addressed to stay local.  He opted to come home.  He was placed on liquid diet and overall severity of his abdominal pain has improved but continues to have pain predominantly in the right lower abdomen, moderate in nature, nagging.  No nausea or vomiting at present.  He had nausea last Tuesday.  Feels like he wants to eat.  Up into the last 2 weeks his bowel movements have remained regular, he had 2 good stools  last week yesterday.  No blood in the stool or melena.  Interestingly he had lost 20 pounds over the past few months but notes that he was on Nutrisystem.  Lost a couple pounds over the past week with changing diet as well.  Currently on Bactrim which was provided at urgent care.     Current Outpatient Medications  Medication Sig Dispense Refill  . amLODipine (NORVASC) 10 MG tablet Take 1 tablet by mouth Daily.    . flecainide (TAMBOCOR) 50 MG tablet TAKE 1 TABLET BY MOUTH  TWICE A DAY 180 tablet 3  . furosemide (LASIX) 40 MG tablet Take 40 mg by mouth daily as needed for fluid.     [name] losartan (COZAAR) 100 MG tablet Take 1 tablet by mouth Daily.    [name] omeprazole (PRILOSEC) 20 MG capsule TAKE 1 CAPSULE BY MOUTH  DAILY (Patient taking differently: as needed. ) 90 capsule 3  . pravastatin (PRAVACHOL) 40 MG tablet Take 40 mg by mouth at bedtime.     . tamsulosin (FLOMAX) 0.4 MG CAPS capsule Take 0.4 mg by mouth daily as needed.      No current facility-administered medications for this visit.     Allergies as of 01/03/2018 - Review Complete 01/03/2018  Allergen Reaction Noted  . Mycinette [phenol] Swelling 10/07/2012  . Penicillins      Past Medical History:  Diagnosis Date  . Atrial fibrillation ([place])    Diagnosed 09/2009, on flecainide  .  Bradycardia    Event monitor 2010: HR down to 45, asymptomatic.  [name] Coronary atherosclerosis of native coronary artery    Nonobstructive and distal disease by cath 2005.   [name] Essential hypertension, benign   . Hyperlipidemia   . OSA (obstructive sleep apnea)   . Sleep apnea     Past Surgical History:  Procedure Laterality Date  . COLONOSCOPY  05/2006   [facility]: 3 mm sigmoid: Tubular adenoma removed.  . COLONOSCOPY N/A 05/06/2016   Dr. [name]: two sessile polyps in mid transverse colon and hepatic flexure (tubular adenomas). ext/int hemorrhoids. tortuous left colon.   [name] KNEE ARTHROSCOPY    . Left anterior cruciate ligament reconstruction    .  POLYPECTOMY  05/06/2016   Procedure: POLYPECTOMY;  Surgeon: [name], MD;  Location: [facility];  Service: Endoscopy;;  transverse colon polyp, hepatic flexure polyp,   . Right shoulder surgery    . TONSILLECTOMY    . TOTAL KNEE ARTHROPLASTY      Family History  Problem Relation Age of Onset  . Pulmonary embolism Mother   . Lung disease Father   . Hyperlipidemia Brother   . Hypertension Brother   . Diabetes Other        Significant family h/o DM  . Colon cancer Neg Hx     Social History   Socioeconomic History  . Marital status: Divorced    Spouse name: Not on file  . Number of children: 3  . Years of education: Not on file  . Highest education level: Not on file  Occupational History  . Occupation: PT-custodial work  Scientific laboratory technician  . Financial resource strain: Not on file  . Food insecurity:    Worry: Not on file    Inability: Not on file  . Transportation needs:    Medical: Not on file    Non-medical: Not on file  Tobacco Use  . Smoking status: Never Smoker  . Smokeless tobacco: Never Used  Substance and Sexual Activity  . Alcohol use: No    Alcohol/week: 0.0 standard drinks  . Drug use: No  . Sexual activity: Not on file  Lifestyle  . Physical activity:    Days per week: Not on file    Minutes per session: Not on file  . Stress: Not on file  Relationships  . Social connections:    Talks on phone: Not on file    Gets together: Not on file    Attends religious service: Not on file    Active member of club or organization: Not on file    Attends meetings of clubs or organizations: Not on file    Relationship status: Not on file  . Intimate partner violence:    Fear of current or ex partner: Not on file    Emotionally abused: Not on file    Physically abused: Not on file    Forced sexual activity: Not on file  Other Topics Concern  . Not on file  Social History Narrative  . Not on file      ROS:  General: Negative for anorexia, chills,  fatigue, weakness.  See HPI Eyes: Negative for vision changes.  ENT: Negative for hoarseness, difficulty swallowing , nasal congestion. CV: Negative for chest pain, angina, palpitations, dyspnea on exertion, peripheral edema.  Respiratory: Negative for dyspnea at rest, dyspnea on exertion, cough, sputum, wheezing.  GI: See history of present illness.  Heartburn well controlled GU:  Negative for dysuria, hematuria, urinary incontinence, urinary  frequency, nocturnal urination.  MS: Negative for joint pain, low back pain.  Derm: Negative for rash or itching.  Neuro: Negative for weakness, abnormal sensation, seizure, frequent headaches, memory loss, confusion.  Psych: Negative for anxiety, depression, suicidal ideation, hallucinations.  Endo: See HPI Heme: Negative for bruising or bleeding. Allergy: Negative for rash or hives.    Physical Examination:  BP 116/70   Pulse (!) 51   Temp (!) 97 F (36.1 C) (Oral)   Ht 6\' 2"  (1.88 m)   Wt 267 lb 3.2 oz (121.2 kg)   BMI 34.31 kg/m    General: Well-nourished, well-developed in no acute distress.  Head: Normocephalic, atraumatic.   Eyes: Conjunctiva pink, no icterus. Mouth: Oropharyngeal mucosa moist and pink , no lesions erythema or exudate. Neck: Supple without thyromegaly, masses, or lymphadenopathy.  Lungs: Clear to auscultation bilaterally.  Heart: Regular rate and rhythm, no murmurs rubs or gallops.  Abdomen: Bowel sounds are normal, mild right mid to right lower quadrant tenderness, nondistended, no hepatosplenomegaly or masses, no abdominal bruits or    hernia , no rebound or guarding.   Rectal: Not performed Extremities: No lower extremity edema. No clubbing or deformities.  Neuro: Alert and oriented x 4 , grossly normal neurologically.  Skin: Warm and dry, no rash or jaundice.   Psych: Alert and cooperative, normal mood and affect.  Labs: Labs from December 29, 2017 CRP 36.9 high, white blood cell count 14,500, hemoglobin 16,  platelets 282,000, sodium 136, potassium 3.9, BUN 15, creatinine 1.24, total bilirubin 1.01, alkaline phosphatase 104, AST 14, ALT 15, albumin 4.2, lipase 14, sed rate 13   Imaging Studies: No results found.  MRI abdomen with and without contrast in April 2019, 2 unilocular cystic pancreatic masses, largest 1.3 x 1 cm in the pancreatic neck, neither of which demonstrate wall thickening or solid enhancement.  No pancreatic duct dilation or pancreas divisum.  Numerous simple and minimally complex renal cyst in both kidneys.  Complex 5.3 x 4.9 cm within the right kidney, Bosniak category 2.  Large simple renal cyst measuring 11.1 x 11.2 cm in the left kidney.  Chest x-ray December 29, 2017 with no evidence of acute pulmonary process.  Abdominal ultrasound December 29, 2017 with bilateral renal cyst but otherwise unremarkable study.  Abdominal x-ray December 29, 2017 with nonobstructive bowel gas pattern.

## 2018-01-03 NOTE — Progress Notes (Signed)
Spoke with patient. TCS scheduled for 01/04/18 at 2:30pm. Patient aware prep/instructions have been faxed to the pharmacy. I also discussed instructions with pt. Informed him that he has to drink all the clear liquids it states to drink. Also informed him of the # to call for on call doc if he developer any problems while prepping. He voiced understanding. Called carolyn in Endo and LMOVM about pre-op

## 2018-01-03 NOTE — Progress Notes (Signed)
Please schedule TCS with propofol for SLF for tomorrow. I have discussed with RMR who agrees patient should be able to prep ok given no obvious obstruction on CT.   Patient needs a split prep. Make sure you emphasized with patient that he has to drink all the water that goes with his prep if he gets short volume prep.   Also let patient know if he develops abdominal pain, vomiting or unable to take his prep he should call us, if after hours he should contact [phone] and asked to speak to Dr. on-call.  He can also come to the emergency department if he has any problems.

## 2018-01-03 NOTE — Assessment & Plan Note (Signed)
62 year old gentleman presenting for further evaluation of cecal mass determined via CT scan on December 29, 2017 after presenting with 2-week history of progressive abdominal pain/right-sided abdominal pain.  Prior to the end of July he denies having any symptoms or bowel concerns.  He has lost 20 pounds in the setting of Nutrisystem this summer as well as liquid diet the past week.  I have personally reviewed CT images Dr. Thornton Papas.  No evidence that patient had obstruction on August 21 at time of study.  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  I have discussed case with Dr. Gala Romney, and Dr. [name] absence today.  We will plan to prep patient today for colonoscopy tomorrow with propofol.  I have discussed the risks, alternatives, benefits with regards to but not limited to the risk of reaction to medication, bleeding, infection, perforation and the patient is agreeable to proceed. Written consent to be obtained.  Patient has been advised to call if he has any issues taking prep, abdominal distention, n/v. He has been given information on how to contact provider after hours if needed.

## 2018-01-03 NOTE — Telephone Encounter (Signed)
Per pre-op note, pre-op nurse will call patient for pre-op phone call since he had labs done recently.

## 2018-01-03 NOTE — Assessment & Plan Note (Signed)
2 cystic pancreatic lesions incidentally found on MRI performed to evaluate renal cyst earlier this year.  Patient already has MRI follow-up scheduled for October 2019.  Await study.

## 2018-01-04 ENCOUNTER — Telehealth: Payer: Self-pay

## 2018-01-04 ENCOUNTER — Encounter (HOSPITAL_COMMUNITY): Payer: Self-pay | Admitting: *Deleted

## 2018-01-04 ENCOUNTER — Ambulatory Visit (HOSPITAL_COMMUNITY): Payer: Medicare Other | Admitting: Anesthesiology

## 2018-01-04 ENCOUNTER — Ambulatory Visit (HOSPITAL_COMMUNITY)
Admission: RE | Admit: 2018-01-04 | Discharge: 2018-01-04 | Disposition: A | Discharge status: Home / Self Care | Payer: Medicare Other | Source: Ambulatory Visit | Attending: Gastroenterology | Admitting: Gastroenterology

## 2018-01-04 ENCOUNTER — Encounter (HOSPITAL_COMMUNITY)
Admission: RE | Disposition: A | Discharge status: Home / Self Care | Payer: Self-pay | Source: Ambulatory Visit | Attending: Gastroenterology

## 2018-01-04 DIAGNOSIS — K529 Noninfective gastroenteritis and colitis, unspecified: Secondary | ICD-10-CM | POA: Diagnosis not present

## 2018-01-04 DIAGNOSIS — R933 Abnormal findings on diagnostic imaging of other parts of digestive tract: Secondary | ICD-10-CM | POA: Diagnosis not present

## 2018-01-04 DIAGNOSIS — Z96659 Presence of unspecified artificial knee joint: Secondary | ICD-10-CM | POA: Diagnosis not present

## 2018-01-04 DIAGNOSIS — K6389 Other specified diseases of intestine: Secondary | ICD-10-CM

## 2018-01-04 DIAGNOSIS — Z880 Allergy status to penicillin: Secondary | ICD-10-CM | POA: Insufficient documentation

## 2018-01-04 DIAGNOSIS — E785 Hyperlipidemia, unspecified: Secondary | ICD-10-CM | POA: Diagnosis not present

## 2018-01-04 DIAGNOSIS — G4733 Obstructive sleep apnea (adult) (pediatric): Secondary | ICD-10-CM | POA: Diagnosis not present

## 2018-01-04 DIAGNOSIS — I2510 Atherosclerotic heart disease of native coronary artery without angina pectoris: Secondary | ICD-10-CM | POA: Insufficient documentation

## 2018-01-04 DIAGNOSIS — K648 Other hemorrhoids: Secondary | ICD-10-CM | POA: Insufficient documentation

## 2018-01-04 DIAGNOSIS — K219 Gastro-esophageal reflux disease without esophagitis: Secondary | ICD-10-CM | POA: Insufficient documentation

## 2018-01-04 DIAGNOSIS — I4891 Unspecified atrial fibrillation: Secondary | ICD-10-CM | POA: Insufficient documentation

## 2018-01-04 DIAGNOSIS — Z881 Allergy status to other antibiotic agents status: Secondary | ICD-10-CM | POA: Insufficient documentation

## 2018-01-04 DIAGNOSIS — Z79899 Other long term (current) drug therapy: Secondary | ICD-10-CM | POA: Insufficient documentation

## 2018-01-04 DIAGNOSIS — I10 Essential (primary) hypertension: Secondary | ICD-10-CM | POA: Insufficient documentation

## 2018-01-04 DIAGNOSIS — Q438 Other specified congenital malformations of intestine: Secondary | ICD-10-CM | POA: Diagnosis not present

## 2018-01-04 DIAGNOSIS — K633 Ulcer of intestine: Secondary | ICD-10-CM | POA: Diagnosis not present

## 2018-01-04 DIAGNOSIS — D122 Benign neoplasm of ascending colon: Secondary | ICD-10-CM | POA: Diagnosis not present

## 2018-01-04 HISTORY — PX: COLONOSCOPY WITH PROPOFOL: SHX5780

## 2018-01-04 HISTORY — PX: BIOPSY: SHX5522

## 2018-01-04 LAB — HM COLONOSCOPY

## 2018-01-04 SURGERY — COLONOSCOPY WITH PROPOFOL
Anesthesia: General

## 2018-01-04 MED ORDER — CHLORHEXIDINE GLUCONATE CLOTH 2 % EX PADS: 6.0000 | MEDICATED_PAD | Freq: Once | CUTANEOUS | Status: DC

## 2018-01-04 MED ORDER — MIDAZOLAM HCL 5 MG/5ML IJ SOLN
INTRAMUSCULAR | Status: DC | PRN
  Administered 2018-01-04: 2 mg via INTRAVENOUS

## 2018-01-04 MED ORDER — LACTATED RINGERS IV SOLN
INTRAVENOUS | Status: DC
  Administered 2018-01-04: 1000 mL via INTRAVENOUS

## 2018-01-04 MED ORDER — MIDAZOLAM HCL 2 MG/2ML IJ SOLN
INTRAMUSCULAR | Status: AC
  Filled 2018-01-04: qty 2

## 2018-01-04 MED ORDER — PROPOFOL 500 MG/50ML IV EMUL
INTRAVENOUS | Status: DC | PRN
  Administered 2018-01-04: 100 ug/kg/min via INTRAVENOUS
  Administered 2018-01-04 (×2): via INTRAVENOUS

## 2018-01-04 NOTE — Telephone Encounter (Signed)
Pt is aware and has already had some results.

## 2018-01-04 NOTE — Op Note (Signed)
[facility] Patient Name: Jonathan Dyer Procedure Date: 01/04/2018 3:18 PM MRN: [id number] Date of Birth: October 27, 1955 Attending MD: Barney Drain MD, MD CSN: [id number] Age: 62 Admit Type: Outpatient Procedure:                Colonoscopy WITH COLD FORCEPS BIOPSY Indications:              Abnormal CT of the GI tract; PT REPORTS POSSIBLE                            MASS IN CECUM. PRESENTED TO OSH WITH RLQ ABDOMINAL                            PAIN. NO DIARRHEA. WORKING AT [employer]. Providers:                Barney Drain MD, MD, Nelda Severe, RN, Aram Candela Referring MD:             Jasper Loser. Luan Pulling MD, MD Medicines:                Propofol per Anesthesia Complications:            No immediate complications. Estimated Blood Loss:     Estimated blood loss was minimal. Procedure:                Pre-Anesthesia Assessment:                           - Prior to the procedure, a History and Physical                            was performed, and patient medications and                            allergies were reviewed. The patient's tolerance of                            previous anesthesia was also reviewed. The risks                            and benefits of the procedure and the sedation                            options and risks were discussed with the patient.                            All questions were answered, and informed consent                            was obtained. Prior Anticoagulants: The patient has                            taken no previous anticoagulant or antiplatelet                            agents. ASA Grade Assessment: II - A patient with  mild systemic disease. After reviewing the risks                            and benefits, the patient was deemed in                            satisfactory condition to undergo the procedure.                           After obtaining informed consent, the colonoscope        was passed under direct vision. Throughout the                            procedure, the patient's blood pressure, pulse, and                            oxygen saturations were monitored continuously. The                            [id number]-[id number] ([id number]) scope was introduced through                            the anus and advanced to the the cecum, identified                            by appendiceal orifice and ileocecal valve. The                            colonoscopy was somewhat difficult due to a                            tortuous colon. Successful completion of the                            procedure was aided by straightening and shortening                            the scope to obtain bowel loop reduction and                            COLOWRAP. The patient tolerated the procedure well.                            The quality of the bowel preparation was good. The                            terminal ileum, ileocecal valve, appendiceal                            orifice, and rectum were photographed. Scope In: 3:53:32 PM Scope Out: 4:12:21 PM Scope Withdrawal Time: 0 hours 16 minutes 37 seconds  Total Procedure Duration: 0 hours 18 minutes 49 seconds  Findings:      The terminal ileum appeared normal.  A continuous area of nonbleeding ulcerated mucosa with no stigmata of       recent bleeding was present in the proximal ascending colon. Biopsies       were taken with a cold forceps for histology.      The recto-sigmoid colon and sigmoid colon were mildly redundant.      Internal hemorrhoids were found. The hemorrhoids were small. Impression:               - The examined portion of the ileum was normal.                           - Mucosal ulceration-ETIOLOGY UNCLEAR BUT MOST                            LIKELY DUE TO ISCHEMIA. Biopsied.                           - Redundant LEFT colon.                           - SMALL Internal hemorrhoids. Moderate Sedation:      Per  Anesthesia Care Recommendation:           - Patient has a contact number available for                            emergencies. The signs and symptoms of potential                            delayed complications were discussed with the                            patient. Return to normal activities tomorrow.                            Written discharge instructions were provided to the                            patient.                           - High fiber diet.                           - Continue present medications.                           - Await pathology results.                           - Repeat colonoscopy for surveillance based on                            pathology results.                           - NEXT OPV TBS AFTER PATH REPORT IS FINAL Procedure Code(s):        ---  Professional ---                           [phone], Colonoscopy, flexible; with biopsy, single                            or multiple Diagnosis Code(s):        --- Professional ---                           K64.8, Other hemorrhoids                           K63.3, Ulcer of intestine                           R93.3, Abnormal findings on diagnostic imaging of                            other parts of digestive tract                           Q43.8, Other specified congenital malformations of                            intestine CPT copyright 2017 American Medical Association. All rights reserved. The codes documented in this report are preliminary and upon coder review may  be revised to meet current compliance requirements. Barney Drain, MD Barney Drain MD, MD 01/04/2018 4:24:46 PM This report has been signed electronically. Number of Addenda: 0

## 2018-01-04 NOTE — Telephone Encounter (Signed)
REVIEWED-NO ADDITIONAL RECOMMENDATIONS. 

## 2018-01-04 NOTE — Anesthesia Postprocedure Evaluation (Signed)
Anesthesia Post Note  Patient: Jonathan Dyer  Procedure(s) Performed: COLONOSCOPY WITH PROPOFOL (N/A ) BIOPSY  Patient location during evaluation: PACU Anesthesia Type: MAC Level of consciousness: awake and alert and patient cooperative Pain management: pain level controlled Vital Signs Assessment: post-procedure vital signs reviewed and stable Respiratory status: spontaneous breathing, nonlabored ventilation and respiratory function stable Cardiovascular status: blood pressure returned to baseline Postop Assessment: no apparent nausea or vomiting Anesthetic complications: no     Last Vitals:  Vitals:   01/04/18 1630 01/04/18 1637  BP: (!) 106/56   Pulse: (!) 52 (!) 47  Resp: 14 13  Temp:    SpO2:      Last Pain:  Vitals:   01/04/18 1619  TempSrc:   PainSc: 0-No pain                 [name]

## 2018-01-04 NOTE — Discharge Instructions (Signed)
YOU HAVE ULCERS IN YOUR RIGHT COLON. You have internal hemorrhoids. YOU DID NOT HAVE ANY POLYPS. THE LAST PART OF YOUR SMALL BOWEL IS NORMAL.  I BIOPSIED YOUR COLON.    DRINK WATER TO KEEP YOUR URINE LIGHT YELLOW.  FOLLOW A HIGH FIBER DIET. AVOID ITEMS THAT CAUSE BLOATING. SEE INFO BELOW.  YOUR BIOPSY WILL BE BACK IN 7 DAYS.  FOLLOW UP IN THE OFFICE AND COLONOSCOPY WILL BE SCHEDULED IF NEEDED AFTER PATH REPORT IS COMPLETE.  Colonoscopy Care After Read the instructions outlined below and refer to this sheet in the next week. These discharge instructions provide you with general information on caring for yourself after you leave the hospital. While your treatment has been planned according to the most current medical practices available, unavoidable complications occasionally occur. If you have any problems or questions after discharge, call DR. Shakeila Pfarr, [phone].  ACTIVITY  You may resume your regular activity, but move at a slower pace for the next 24 hours.   Take frequent rest periods for the next 24 hours.   Walking will help get rid of the air and reduce the bloated feeling in your belly (abdomen).   No driving for 24 hours (because of the medicine (anesthesia) used during the test).   You may shower.   Do not sign any important legal documents or operate any machinery for 24 hours (because of the anesthesia used during the test).    NUTRITION  Drink plenty of fluids.   You may resume your normal diet as instructed by your doctor.   Begin with a light meal and progress to your normal diet. Heavy or fried foods are harder to digest and may make you feel sick to your stomach (nauseated).   Avoid alcoholic beverages for 24 hours or as instructed.    MEDICATIONS  You may resume your normal medications.   WHAT YOU CAN EXPECT TODAY  Some feelings of bloating in the abdomen.   Passage of more gas than usual.   Spotting of blood in your stool or on the toilet  paper  .  IF YOU HAD POLYPS REMOVED DURING THE COLONOSCOPY:  Eat a soft diet IF YOU HAVE NAUSEA, BLOATING, ABDOMINAL PAIN, OR VOMITING.    FINDING OUT THE RESULTS OF YOUR TEST Not all test results are available during your visit. DR. [name] WILL CALL YOU WITHIN 7 DAYS OF YOUR PROCEDUE WITH YOUR RESULTS. Do not assume everything is normal if you have not heard from DR. Mylasia Vorhees IN ONE WEEK, CALL HER OFFICE AT [phone].  SEEK IMMEDIATE MEDICAL ATTENTION AND CALL THE OFFICE: [phone] IF:  You have more than a spotting of blood in your stool.   Your belly is swollen (abdominal distention).   You are nauseated or vomiting.   You have a temperature over 101F.   You have abdominal pain or discomfort that is severe or gets worse throughout the day.  High-Fiber Diet A high-fiber diet changes your normal diet to include more whole grains, legumes, fruits, and vegetables. Changes in the diet involve replacing refined carbohydrates with unrefined foods. The calorie level of the diet is essentially unchanged. The Dietary Reference Intake (recommended amount) for adult males is 38 grams per day. For adult females, it is 25 grams per day. Pregnant and lactating women should consume 28 grams of fiber per day. Fiber is the intact part of a plant that is not broken down during digestion. Functional fiber is fiber that has been isolated from the plant to  provide a beneficial effect in the body.  PURPOSE  Increase stool bulk.   Ease and regulate bowel movements.   Lower cholesterol.   REDUCE RISK OF COLON CANCER   INDICATIONS THAT YOU NEED MORE FIBER  Constipation and hemorrhoids.   Uncomplicated diverticulosis (intestine condition) and irritable bowel syndrome.   Weight management.   As a protective measure against hardening of the arteries (atherosclerosis), diabetes, and cancer.   GUIDELINES FOR INCREASING FIBER IN THE DIET  Start adding fiber to the diet slowly. A gradual  increase of about 5 more grams (2 slices of whole-wheat bread, 2 servings of most fruits or vegetables, or 1 bowl of high-fiber cereal) per day is best. Too rapid an increase in fiber may result in constipation, flatulence, and bloating.   Drink enough water and fluids to keep your urine clear or pale yellow. Water, juice, or caffeine-free drinks are recommended. Not drinking enough fluid may cause constipation.   Eat a variety of high-fiber foods rather than one type of fiber.   Try to increase your intake of fiber through using high-fiber foods rather than fiber pills or supplements that contain small amounts of fiber.   The goal is to change the types of food eaten. Do not supplement your present diet with high-fiber foods, but replace foods in your present diet.   INCLUDE A VARIETY OF FIBER SOURCES  Replace refined and processed grains with whole grains, canned fruits with fresh fruits, and incorporate other fiber sources. White rice, white breads, and most bakery goods contain little or no fiber.   Brown whole-grain rice, buckwheat oats, and many fruits and vegetables are all good sources of fiber. These include: broccoli, Brussels sprouts, cabbage, cauliflower, beets, sweet potatoes, white potatoes (skin on), carrots, tomatoes, eggplant, squash, berries, fresh fruits, and dried fruits.   Cereals appear to be the richest source of fiber. Cereal fiber is found in whole grains and bran. Bran is the fiber-rich outer coat of cereal grain, which is largely removed in refining. In whole-grain cereals, the bran remains. In breakfast cereals, the largest amount of fiber is found in those with "bran" in their names. The fiber content is sometimes indicated on the label.   You may need to include additional fruits and vegetables each day.   In baking, for 1 cup white flour, you may use the following substitutions:   1 cup whole-wheat flour minus 2 tablespoons.   1/2 cup white flour plus 1/2 cup  whole-wheat flour.

## 2018-01-04 NOTE — Telephone Encounter (Addendum)
Pt called and is scheduled for his colonoscopy this afternoon at 2:30 pm. He said it took 3 hours yesterday for his prep to start taking effect after he drank it. He started prep this Am at 9:30 as directed. He has not started having any results yet. His stools yesterday were dark and watery. I told him he should be having some soon and not to give up yet. Told him I will send Dr. [name] a message and let her know. His call back number is [phone].

## 2018-01-04 NOTE — H&P (Signed)
Primary Care Physician:  Sinda Du, MD Primary Gastroenterologist:  Dr. [name]  Pre-Procedure History & Physical: HPI:  Jonathan Dyer is a 62 y.o. male here for Abnormal CT scan: SOFT TISSUE MASS AT cecum.  Past Medical History:  Diagnosis Date  . Atrial fibrillation ([place])    Diagnosed 09/2009, on flecainide  . Bradycardia    Event monitor 2010: HR down to 45, asymptomatic.  [name] Coronary atherosclerosis of native coronary artery    Nonobstructive and distal disease by cath 2005.   [name] Essential hypertension, benign   . Hyperlipidemia   . OSA (obstructive sleep apnea)   . Sleep apnea     Past Surgical History:  Procedure Laterality Date  . COLONOSCOPY  05/2006   SLF: 3 mm sigmoid: Tubular adenoma removed.  . COLONOSCOPY N/A 05/06/2016   Dr. [name]: two sessile polyps in mid transverse colon and hepatic flexure (tubular adenomas). ext/int hemorrhoids. tortuous left colon.   [name] KNEE ARTHROSCOPY    . Left anterior cruciate ligament reconstruction    . POLYPECTOMY  05/06/2016   Procedure: POLYPECTOMY;  Surgeon: [name], MD;  [facility];  Service: Endoscopy;;  transverse colon polyp, hepatic flexure polyp,   . Right shoulder surgery    . TONSILLECTOMY    . TOTAL KNEE ARTHROPLASTY      Prior to Admission medications   Medication Sig Start Date End Date Taking? Authorizing Provider  amLODipine (NORVASC) 10 MG tablet Take 1 tablet by mouth Daily. 04/09/12  Yes [provider]  flecainide (TAMBOCOR) 50 MG tablet TAKE 1 TABLET BY MOUTH  TWICE A DAY 03/22/17  Yes Satira Sark, MD  furosemide (LASIX) 40 MG tablet Take 40 mg by mouth daily as needed for fluid.  08/18/13  Yes [provider]  losartan (COZAAR) 100 MG tablet Take 1 tablet by mouth Daily. 04/09/12  Yes [provider]  omeprazole (PRILOSEC) 20 MG capsule TAKE 1 CAPSULE BY MOUTH  DAILY Patient taking differently: Take 20 mg by mouth daily as needed (heartburn).   04/29/17  Yes Satira Sark, MD  pravastatin (PRAVACHOL) 40 MG tablet Take 40 mg by mouth at bedtime.  08/11/11  Yes [provider]  sulfamethoxazole-trimethoprim (BACTRIM DS,SEPTRA DS) 800-160 MG tablet Take 1 tablet by mouth 2 (two) times daily. Take for 7 days - complete 01/06/2018   Yes [provider]  tamsulosin (FLOMAX) 0.4 MG CAPS capsule Take 0.4 mg by mouth daily as needed.  10/03/13  Yes [provider]    Allergies as of 01/03/2018 - Review Complete 01/03/2018  Allergen Reaction Noted  . Clindamycin/lincomycin Swelling 01/03/2018  . Penicillins      Family History  Problem Relation Age of Onset  . Pulmonary embolism Mother   . Lung disease Father   . Hyperlipidemia Brother   . Hypertension Brother   . Diabetes Other        Significant family h/o DM  . Colon cancer Neg Hx     Social History   Socioeconomic History  . Marital status: Divorced    Spouse name: Not on file  . Number of children: 3  . Years of education: Not on file  . Highest education level: Not on file  Occupational History  . Occupation: PT-custodial work  Scientific laboratory technician  . Financial resource strain: Not on file  . Food insecurity:    Worry: Not on file    Inability: Not on file  . Transportation needs:  Medical: Not on file    Non-medical: Not on file  Tobacco Use  . Smoking status: Never Smoker  . Smokeless tobacco: Never Used  Substance and Sexual Activity  . Alcohol use: No    Alcohol/week: 0.0 standard drinks  . Drug use: No  . Sexual activity: Not on file  Lifestyle  . Physical activity:    Days per week: Not on file    Minutes per session: Not on file  . Stress: Not on file  Relationships  . Social connections:    Talks on phone: Not on file    Gets together: Not on file    Attends religious service: Not on file    Active member of club or organization: Not on file    Attends meetings of clubs or organizations: Not on file    Relationship  status: Not on file  . Intimate partner violence:    Fear of current or ex partner: Not on file    Emotionally abused: Not on file    Physically abused: Not on file    Forced sexual activity: Not on file  Other Topics Concern  . Not on file  Social History Narrative  . Not on file    Review of Systems: See HPI, otherwise negative ROS   Physical Exam: There were no vitals taken for this visit. General:   Alert,  pleasant and cooperative in NAD Head:  Normocephalic and atraumatic. Neck:  Supple; Lungs:  Clear throughout to auscultation.    Heart:  Regular rate and rhythm. Abdomen:  Soft, nontender and nondistended. Normal bowel sounds, without guarding, and without rebound.   Neurologic:  Alert and  oriented x4;  grossly normal neurologically.  Impression/Plan:    Abnormal CT scan: SOFT TISSUE MASS AT cecum.  Plan: 1. TCS TODAY.DISCUSSED PROCEDURE, BENEFITS, & RISKS: < 1% chance of medication reaction, bleeding, perforation, or rupture of spleen/liver.

## 2018-01-04 NOTE — Telephone Encounter (Signed)
PLEASE CALL PT. He should come for procedure. WE CAN GIVE HIM AN ENEMA IN THE PREOP AREA.

## 2018-01-04 NOTE — Transfer of Care (Signed)
Immediate Anesthesia Transfer of Care Note  Patient: Jonathan Dyer  Procedure(s) Performed: COLONOSCOPY WITH PROPOFOL (N/A ) BIOPSY  Patient Location: PACU  Anesthesia Type:MAC  Level of Consciousness: drowsy  Airway & Oxygen Therapy: Patient Spontanous Breathing and Patient connected to face mask oxygen  Post-op Assessment: Report given to RN and Post -op Vital signs reviewed and stable  Post vital signs: Reviewed and stable  Last Vitals:  Vitals Value Taken Time  BP    Temp    Pulse 48 01/04/2018  4:24 PM  Resp 14 01/04/2018  4:24 PM  SpO2 99 % 01/04/2018  4:24 PM  Vitals shown include unvalidated device data.  Last Pain:  Vitals:   01/04/18 1430  TempSrc: Oral  PainSc: 2       Patients Stated Pain Goal: 8 ([id number])  Complications: No apparent anesthesia complications

## 2018-01-04 NOTE — Anesthesia Preprocedure Evaluation (Signed)
Anesthesia Evaluation  Patient identified by MRN, date of birth, ID band Patient awake    Reviewed: Allergy & Precautions, NPO status , Patient's Chart, lab work & pertinent test results  Airway Mallampati: II  TM Distance: >3 FB Neck ROM: Full    Dental no notable dental hx.    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 on medications + CAD  negative cardio ROS Normal cardiovascular examI Rhythm:Regular Rate:Normal  States has recently been biking miles at altitude -denies any recent CP PAF- last ECG SR - on flecanide    Neuro/Psych negative neurological ROS  negative psych ROS   GI/Hepatic negative GI ROS, Neg liver ROS, GERD  Medicated,Describes occasional Nausea    Endo/Other  negative endocrine ROS  Renal/GU Renal diseasenegative Renal ROSMult kidney or perinephric cysts -which he states will be operated on shortly in the future   negative genitourinary   Musculoskeletal negative musculoskeletal ROS (+)   Abdominal   Peds negative pediatric ROS (+)  Hematology negative hematology ROS (+)   Anesthesia Other Findings   Reproductive/Obstetrics negative OB ROS                             Anesthesia Physical Anesthesia Plan  ASA: III  Anesthesia Plan: General   Post-op Pain Management:    Induction: Intravenous  PONV Risk Score and Plan:   Airway Management Planned: Simple Face Mask and Nasal Cannula  Additional Equipment:   Intra-op Plan:   Post-operative Plan: Extubation in OR  Informed Consent: I have reviewed the patients History and Physical, chart, labs and discussed the procedure including the risks, benefits and alternatives for the proposed anesthesia with the  patient or authorized representative who has indicated his/her understanding and acceptance.   Dental advisory given  Plan Discussed with: CRNA  Anesthesia Plan Comments: (ETT if N/V is persistant )        Anesthesia Quick Evaluation

## 2018-01-06 ENCOUNTER — Telehealth: Payer: Self-pay | Admitting: Gastroenterology

## 2018-01-06 ENCOUNTER — Encounter (HOSPITAL_COMMUNITY): Payer: Self-pay | Admitting: Gastroenterology

## 2018-01-06 DIAGNOSIS — K559 Vascular disorder of intestine, unspecified: Secondary | ICD-10-CM

## 2018-01-06 NOTE — Telephone Encounter (Signed)
PT had questions from his AVS about the ulcers and wants to know what caused them and what is he to do for them. He said he did take Ibuprofen for about a month but he usually did take it right after breakfast. He said no rush he was just curious.

## 2018-01-06 NOTE — Telephone Encounter (Signed)
PT is aware and OK to schedule.  

## 2018-01-06 NOTE — Addendum Note (Signed)
Addended by: Inge Rise on: 01/06/2018 02:04 PM   Modules accepted: Orders

## 2018-01-06 NOTE — Telephone Encounter (Signed)
CT scheduled for 01/12/18 at 2:30pm, arrival time 2:15pm, npo 4 hrs prior to test.  Called made patient aware of appt details. He voiced understanding.  [place] website, no PA is required for CT Angio

## 2018-01-06 NOTE — Telephone Encounter (Signed)
Pt is concerned about the ulcers.

## 2018-01-06 NOTE — Telephone Encounter (Signed)
Dr. [name], pt said that he had been in [place] and they had lower humidity. He did not drink a lot of water. He wanted me to [facility] you on this, but he is proceeding with the CT angiogram.

## 2018-01-06 NOTE — Telephone Encounter (Signed)
PLEASE CALL PT. HIS COLON BIOPSIES SHOW COLON ISCHEMIA WHICH CAN HAPPEN IF HE GETS DEHYDRATED. HE NEEDS A CT ANGIOGRAM OF THE ABDOMEN AND PELVIS WITHIN 7 DAYS TO LOOK AT THE VESSELS IN HIS ABDOMEN FOR SIGNIFICANT BLOCKAGES(DX: COLON ISCHEMIA).

## 2018-01-06 NOTE — Telephone Encounter (Signed)
Pt has questions about his recent colonoscopy. [phone]

## 2018-01-06 NOTE — Telephone Encounter (Signed)
REVIEWED-NO ADDITIONAL RECOMMENDATIONS. 

## 2018-01-12 ENCOUNTER — Ambulatory Visit (HOSPITAL_COMMUNITY)
Admission: RE | Admit: 2018-01-12 | Discharge: 2018-01-12 | Disposition: A | Discharge status: Home / Self Care | Payer: Medicare Other | Source: Ambulatory Visit | Attending: Gastroenterology | Admitting: Gastroenterology

## 2018-01-12 ENCOUNTER — Encounter (HOSPITAL_COMMUNITY): Payer: Self-pay

## 2018-01-12 DIAGNOSIS — K559 Vascular disorder of intestine, unspecified: Secondary | ICD-10-CM | POA: Diagnosis not present

## 2018-01-12 DIAGNOSIS — N400 Enlarged prostate without lower urinary tract symptoms: Secondary | ICD-10-CM | POA: Insufficient documentation

## 2018-01-12 LAB — POCT I-STAT CREATININE: CREATININE: 1.1 mg/dL (ref 0.61–1.24)

## 2018-01-12 MED ORDER — IOPAMIDOL (ISOVUE-300) INJECTION 61%: 100.0000 mL | Freq: Once | INTRAVENOUS | Status: DC | PRN

## 2018-01-12 MED ORDER — IOPAMIDOL (ISOVUE-370) INJECTION 76%
100.0000 mL | Freq: Once | INTRAVENOUS | Status: AC | PRN
  Administered 2018-01-12: 100 mL via INTRAVENOUS

## 2018-01-12 NOTE — Telephone Encounter (Signed)
Pt is aware.  

## 2018-01-12 NOTE — Telephone Encounter (Signed)
PLEASE CALL PT. His ulcer sinhis colon are most likely DUE TO COLON ISCHEMIA. COLON ISCHEMIA IS MOST LIKELY DUE TO SMALL VESSEL DISEASE IN HIS COLON AND DEHYDRATION. WHEN YOU ARE DEHYDRATED YOU CAN HAVE LOW BLOOD SUPPLY TO  YOUR COLON. IBUPROFEN DID NOT CAUSE THE ULCERS IN HIS COLON.

## 2018-01-12 NOTE — Telephone Encounter (Signed)
LMOM for a return call.  

## 2018-01-13 ENCOUNTER — Telehealth: Payer: Self-pay | Admitting: Gastroenterology

## 2018-01-13 NOTE — Telephone Encounter (Signed)
Pt is aware and still has questions. He would like a phone call from Dr. [name] at her convenience. 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 He cannot understand how he was possibly so dehydrated as much as he drank every day. Said he drank about 3 bottles of water and then tea and soft drinks. He got up 3-4 times at might to urinate, so he doesn't understand. He would just like to discuss with Dr. [name].

## 2018-01-13 NOTE — Telephone Encounter (Signed)
PLEASE CALL PT. HIS CTA SHOWS NO EVIDENCE FOR SIGNIFICANT BLOCKAGES IN THE LARGE VESSELS IN HIS ABDOMEN. THE COLON ISCHEMIA WAS DUE TO DEHYDRATION AND SMALL VESSEL DISEASE. TO AVOID THIS HAPPENING IN THE FUTURE WHEN PARTICIPATING IN MILD TO MODERATE EXERCISE IN A HOT CLIMATE, HE SHOULD DRINK WATER TO KEEP YOUR URINE LIGHT YELLOW.   He HAS AN ENLARGED PROSTATE AND SHOULD SEE UROLOGY.

## 2018-01-14 NOTE — Telephone Encounter (Addendum)
Called patient TO DISCUSS CONCERNS. 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 EXPLAINED ETIOLOGY AND RECOMMENDED ONLY DRINK WATER. AVOID CAFFEINE IN THE FUTURE WHEN AT HIGH ALTITUDE.   OK TO HAVE SWEET TEA. AVOID COKE. PT AWARE OF BPH. SEEING UROLOGIST IN OCT.

## 2018-01-18 NOTE — Telephone Encounter (Signed)
Noted  

## 2018-02-16 ENCOUNTER — Other Ambulatory Visit (HOSPITAL_COMMUNITY): Payer: Self-pay | Admitting: Pulmonary Disease

## 2018-02-16 ENCOUNTER — Ambulatory Visit (HOSPITAL_COMMUNITY)
Admission: RE | Admit: 2018-02-16 | Discharge: 2018-02-16 | Disposition: A | Discharge status: Home / Self Care | Payer: Medicare Other | Source: Ambulatory Visit | Attending: Pulmonary Disease | Admitting: Pulmonary Disease

## 2018-02-16 DIAGNOSIS — K869 Disease of pancreas, unspecified: Secondary | ICD-10-CM | POA: Insufficient documentation

## 2018-02-16 DIAGNOSIS — N281 Cyst of kidney, acquired: Secondary | ICD-10-CM | POA: Insufficient documentation

## 2018-02-16 DIAGNOSIS — K862 Cyst of pancreas: Secondary | ICD-10-CM | POA: Diagnosis not present

## 2018-02-16 DIAGNOSIS — R932 Abnormal findings on diagnostic imaging of liver and biliary tract: Secondary | ICD-10-CM | POA: Diagnosis not present

## 2018-02-16 MED ORDER — GADOBUTROL 1 MMOL/ML IV SOLN
10.0000 mL | Freq: Once | INTRAVENOUS | Status: AC | PRN
  Administered 2018-02-16: 10 mL via INTRAVENOUS

## 2018-02-21 DIAGNOSIS — I5030 Unspecified diastolic (congestive) heart failure: Secondary | ICD-10-CM | POA: Diagnosis not present

## 2018-02-21 DIAGNOSIS — I480 Paroxysmal atrial fibrillation: Secondary | ICD-10-CM | POA: Diagnosis not present

## 2018-02-21 DIAGNOSIS — I25119 Atherosclerotic heart disease of native coronary artery with unspecified angina pectoris: Secondary | ICD-10-CM | POA: Diagnosis not present

## 2018-02-21 DIAGNOSIS — R109 Unspecified abdominal pain: Secondary | ICD-10-CM | POA: Diagnosis not present

## 2018-02-22 DIAGNOSIS — N281 Cyst of kidney, acquired: Secondary | ICD-10-CM | POA: Diagnosis not present

## 2018-03-08 NOTE — Progress Notes (Signed)
Cardiology Office Note  Date: 03/09/2018   ID: Maxx, Pham 1956/04/24, MRN [id number]  PCP: Sinda Du, MD  Primary Cardiologist: Rozann Lesches, MD   Chief Complaint  Patient presents with  . Atrial Fibrillation    History of Present Illness: Jonathan Dyer is a 62 y.o. male last seen in January.  He is here for a follow-up visit.  From a cardiac perspective he does not report any chest pain or palpitations, no unusual shortness of breath.  He has had interval GI evaluation with Dr. [name] and urology evaluation with Dr. Alyson Ingles.  There had been some concerns about ischemic colitis although CT angiogram following colonoscopy demonstrated normal vascular anatomy of the celiac, SMA, and IMA.  He does have bilateral renal cysts and states that he will likely undergo surgical intervention next spring.   We went over his medications.  He continues on flecainide, no AV nodal blockers with resting bradycardia.  He has declined anticoagulation so far, has a CHADSVASC score of 2.  He underwent a negative GXT in 2016.  He reports no angina and good exercise tolerance, still cycles regularly, states that he had a recent 10 mile mountain ride.  Past Medical History:  Diagnosis Date  . Atrial fibrillation ([place])    Diagnosed 09/2009, on flecainide  . Bradycardia    Event monitor 2010: HR down to 45, asymptomatic.  [name] Coronary atherosclerosis of native coronary artery    Nonobstructive and distal disease by cath 2005.   [name] Essential hypertension, benign   . Hyperlipidemia   . OSA (obstructive sleep apnea)   . Sleep apnea     Past Surgical History:  Procedure Laterality Date  . BIOPSY  01/04/2018   Procedure: BIOPSY;  Surgeon: [name], MD;  [facility];  Service: Endoscopy;;  ascending colon   . COLONOSCOPY  05/2006   SLF: 3 mm sigmoid: Tubular adenoma removed.  . COLONOSCOPY N/A 05/06/2016   Dr. [name]: two sessile polyps in mid transverse  colon and hepatic flexure (tubular adenomas). ext/int hemorrhoids. tortuous left colon.   . COLONOSCOPY WITH PROPOFOL N/A 01/04/2018   Procedure: COLONOSCOPY WITH PROPOFOL;  Surgeon: [name], MD;  [facility];  Service: Endoscopy;  Laterality: N/A;  2:30pm  . KNEE ARTHROSCOPY    . Left anterior cruciate ligament reconstruction    . POLYPECTOMY  05/06/2016   Procedure: POLYPECTOMY;  Surgeon: [name], MD;  [facility];  Service: Endoscopy;;  transverse colon polyp, hepatic flexure polyp,   . Right shoulder surgery    . TONSILLECTOMY    . TOTAL KNEE ARTHROPLASTY      Current Outpatient Medications  Medication Sig Dispense Refill  . amLODipine (NORVASC) 10 MG tablet Take 1 tablet by mouth Daily.    . flecainide (TAMBOCOR) 50 MG tablet TAKE 1 TABLET BY MOUTH  TWICE A DAY 180 tablet 3  . furosemide (LASIX) 40 MG tablet Take 40 mg by mouth daily as needed for fluid.     [name] losartan (COZAAR) 100 MG tablet Take 1 tablet by mouth Daily.    [name] omeprazole (PRILOSEC) 20 MG capsule TAKE 1 CAPSULE BY MOUTH  DAILY (Patient taking differently: Take 20 mg by mouth daily as needed (heartburn). ) 90 capsule 3  . pravastatin (PRAVACHOL) 40 MG tablet Take 40 mg by mouth at bedtime.     . tamsulosin (FLOMAX) 0.4 MG CAPS capsule Take 0.4 mg by mouth daily as needed.  No current facility-administered medications for this visit.    Allergies:  Clindamycin/lincomycin and Penicillins   Social History: The patient  reports that he has never smoked. He has never used smokeless tobacco. He reports that he does not drink alcohol or use drugs.   ROS:  Please see the history of present illness. Otherwise, complete review of systems is positive for none.  All other systems are reviewed and negative.   Physical Exam: VS:  BP 110/64   Pulse 61   Ht 6\' 2"  (1.88 m)   Wt 279 lb (126.6 kg)   SpO2 98%   BMI 35.82 kg/m , BMI Body mass index is 35.82 kg/m.  Wt Readings from Last 3  Encounters:  03/09/18 279 lb (126.6 kg)  01/03/18 267 lb 3.2 oz (121.2 kg)  05/26/17 290 lb 3.2 oz (131.6 kg)    General: Patient appears comfortable at rest. HEENT: Conjunctiva and lids normal, oropharynx clear. Neck: Supple, no elevated JVP or carotid bruits, no thyromegaly. Lungs: Clear to auscultation, nonlabored breathing at rest. Cardiac: Regular rate and rhythm, no S3 or significant systolic murmur. Abdomen: Soft, nontender, bowel sounds present. Extremities: Trace ankle edema, distal pulses 2+. Skin: Warm and dry. Musculoskeletal: No kyphosis. Neuropsychiatric: Alert and oriented x3, affect grossly appropriate.  ECG: I personally reviewed the tracing from 05/26/2017 which showed sinus rhythm.  Recent Labwork: 01/12/2018: Creatinine, Ser 1.10   Other Studies Reviewed Today:  Echocardiogram 11/18/2016: Study Conclusions  - Left ventricle: The cavity size was normal. Wall thickness was increased in a pattern of mild LVH. Systolic function was normal. The estimated ejection fraction was in the range of 55% to 60%. Wall motion was normal; there were no regional wall motion abnormalities. Left ventricular diastolic function parameters were normal. - Aortic valve: Mildly calcified annulus. Trileaflet; mildly thickened leaflets. Valve area (VTI): 3.42 cm^2. Valve area (Vmax): 3.19 cm^2. Valve area (Vmean): 2.93 cm^2. - Mitral valve: Mildly calcified annulus. Mildly thickened leaflets. - Technically adequate study.  Assessment and Plan:  1.  Paroxysmal atrial fibrillation with CHADSVASC score of 2.  He is doing well in terms of symptom control but has declined anticoagulation.  He remains on flecainide for rhythm suppression, not on any AV nodal blockers with resting bradycardia.  2.  Prior history of mild coronary atherosclerosis, nonobstructive, negative GXT in 2016.  He reports no active ischemic symptoms with good exercise tolerance.  3.  Essential  hypertension, continues to follow with PCP, blood pressure is normal today.  4.  Bilateral renal cysts, followed by Dr. Alyson Ingles.  Patient states that he will likely undergo robotic surgery in the spring.  Doubt that will that we will need to pursue any specific ischemic evaluation prior to this unless he develops interval symptoms.  Current medicines were reviewed with the patient today.  Disposition: Follow-up in 6 months.  Signed, Satira Sark, MD, [facility] 03/09/2018 4:05 PM    [facility] [address] [place], [place] [address] Phone: [phone]; Fax: [phone]

## 2018-03-09 ENCOUNTER — Encounter: Payer: Self-pay | Admitting: Cardiology

## 2018-03-09 ENCOUNTER — Ambulatory Visit: Payer: Medicare Other | Admitting: Cardiology

## 2018-03-09 VITALS — BP 110/64 | HR 61 | Ht 74.0 in | Wt 279.0 lb

## 2018-03-09 DIAGNOSIS — I10 Essential (primary) hypertension: Secondary | ICD-10-CM

## 2018-03-09 DIAGNOSIS — I480 Paroxysmal atrial fibrillation: Secondary | ICD-10-CM

## 2018-03-09 DIAGNOSIS — N281 Cyst of kidney, acquired: Secondary | ICD-10-CM

## 2018-03-09 DIAGNOSIS — I2510 Atherosclerotic heart disease of native coronary artery without angina pectoris: Secondary | ICD-10-CM | POA: Diagnosis not present

## 2018-03-09 NOTE — Patient Instructions (Signed)
Medication Instructions:  Your physician recommends that you continue on your current medications as directed. Please refer to the Current Medication list given to you today.  If you need a refill on your cardiac medications before your next appointment, please call your pharmacy.   Lab work: NONE If you have labs (blood work) drawn today and your tests are completely normal, you will receive your results only by: . MyChart Message (if you have MyChart) OR . A paper copy in the mail If you have any lab test that is abnormal or we need to change your treatment, we will call you to review the results.  Testing/Procedures: NONE  Follow-Up: At [facility], you and your health needs are our priority.  As part of our continuing mission to provide you with exceptional heart care, we have created designated Provider Care Teams.  These Care Teams include your primary Cardiologist (physician) and Advanced Practice Providers (APPs -  Physician Assistants and Nurse Practitioners) who all work together to provide you with the care you need, when you need it. You will need a follow up appointment in 6 months.  Please call our office 2 months in advance to schedule this appointment.  You may see Samuel McDowell, MD or one of the following Advanced Practice Providers on your designated Care Team:   Brittany Strader, PA-C ([place] Office) . Michele Lenze, PA-C ([place] Office)  Any Other Special Instructions Will Be Listed Below (If Applicable). NONE   

## 2018-03-24 ENCOUNTER — Encounter (HOSPITAL_COMMUNITY): Admission: RE | Payer: Self-pay | Source: Ambulatory Visit

## 2018-03-24 ENCOUNTER — Other Ambulatory Visit: Payer: Self-pay | Admitting: Cardiology

## 2018-03-24 ENCOUNTER — Inpatient Hospital Stay (HOSPITAL_COMMUNITY): Admission: RE | Admit: 2018-03-24 | Payer: Medicare Other | Source: Ambulatory Visit | Admitting: Urology

## 2018-03-24 SURGERY — NEPHRECTOMY, RADICAL, ROBOT-ASSISTED, LAPAROSCOPIC, ADULT
Anesthesia: General | Laterality: Left

## 2018-05-24 DIAGNOSIS — Z23 Encounter for immunization: Secondary | ICD-10-CM | POA: Diagnosis not present

## 2018-05-24 DIAGNOSIS — M159 Polyosteoarthritis, unspecified: Secondary | ICD-10-CM | POA: Diagnosis not present

## 2018-05-24 DIAGNOSIS — I480 Paroxysmal atrial fibrillation: Secondary | ICD-10-CM | POA: Diagnosis not present

## 2018-05-24 DIAGNOSIS — I2510 Atherosclerotic heart disease of native coronary artery without angina pectoris: Secondary | ICD-10-CM | POA: Diagnosis not present

## 2018-05-24 DIAGNOSIS — I10 Essential (primary) hypertension: Secondary | ICD-10-CM | POA: Diagnosis not present

## 2018-06-23 NOTE — Progress Notes (Signed)
Need orders in epic.  preop on 06/28/2018.  Thanks.

## 2018-06-27 NOTE — Progress Notes (Signed)
lov cards Dr Domenic Polite 03-09-18 epic   Echo 11-18-16 epic

## 2018-06-27 NOTE — Patient Instructions (Addendum)
Jonathan Dyer  06/27/2018   Your procedure is scheduled on: 07-04-2018   Report to [facility] Main  Entrance    Report to [place] at 5:30AM    Call this number if you have problems the morning of surgery [phone]      Remember: Do not eat food or drink liquids :After Midnight. BRUSH YOUR TEETH MORNING OF SURGERY AND RINSE YOUR MOUTH OUT, NO CHEWING GUM CANDY OR MINTS.     Take these medicines the morning of surgery with A SIP OF WATER: amlodipine, flecainide, tamsulosin, omeprazole                                You may not have any metal on your body including hair pins and              piercings  Do not wear jewelry, make-up, lotions, powders or perfumes, deodorant                   Men may shave face and neck.     Do not bring valuables to the hospital. [place].  Contacts, dentures or bridgework may not be worn into surgery.  Leave suitcase in the car. After surgery it may be brought to your room.                 Please read over the following fact sheets you were given: _____________________________________________________________________             [facility] - Preparing for Surgery Before surgery, you can play an important role.  Because skin is not sterile, your skin needs to be as free of germs as possible.  You can reduce the number of germs on your skin by washing with CHG (chlorahexidine gluconate) soap before surgery.  CHG is an antiseptic cleaner which kills germs and bonds with the skin to continue killing germs even after washing. Please DO NOT use if you have an allergy to CHG or antibacterial soaps.  If your skin becomes reddened/irritated stop using the CHG and inform your nurse when you arrive at Short Stay. Do not shave (including legs and underarms) for at least 48 hours prior to the first CHG shower.  You may shave your face/neck. Please follow these instructions  carefully:  1.  Shower with CHG Soap the night before surgery and the  morning of Surgery.  2.  If you choose to wash your hair, wash your hair first as usual with your  normal  shampoo.  3.  After you shampoo, rinse your hair and body thoroughly to remove the  shampoo.                           4.  Use CHG as you would any other liquid soap.  You can apply chg directly  to the skin and wash                       Gently with a scrungie or clean washcloth.  5.  Apply the CHG Soap to your body ONLY FROM THE NECK DOWN.   Do not use on face/ open  Wound or open sores. Avoid contact with eyes, ears mouth and genitals (private parts).                       Wash face,  Genitals (private parts) with your normal soap.             6.  Wash thoroughly, paying special attention to the area where your surgery  will be performed.  7.  Thoroughly rinse your body with warm water from the neck down.  8.  DO NOT shower/wash with your normal soap after using and rinsing off  the CHG Soap.                9.  Pat yourself dry with a clean towel.            10.  Wear clean pajamas.            11.  Place clean sheets on your bed the night of your first shower and do not  sleep with pets. Day of Surgery : Do not apply any lotions/deodorants the morning of surgery.  Please wear clean clothes to the hospital/surgery center.  FAILURE TO FOLLOW THESE INSTRUCTIONS MAY RESULT IN THE CANCELLATION OF YOUR SURGERY PATIENT SIGNATURE_________________________________  NURSE SIGNATURE__________________________________  ________________________________________________________________________

## 2018-06-28 ENCOUNTER — Other Ambulatory Visit: Payer: Self-pay

## 2018-06-28 ENCOUNTER — Encounter (HOSPITAL_COMMUNITY)
Admission: RE | Admit: 2018-06-28 | Discharge: 2018-06-28 | Disposition: A | Discharge status: Home / Self Care | Payer: Medicare Other | Source: Ambulatory Visit | Attending: Urology | Admitting: Urology

## 2018-06-28 ENCOUNTER — Encounter (HOSPITAL_COMMUNITY): Payer: Self-pay

## 2018-06-28 ENCOUNTER — Other Ambulatory Visit: Payer: Self-pay | Admitting: Cardiology

## 2018-06-28 DIAGNOSIS — I4891 Unspecified atrial fibrillation: Secondary | ICD-10-CM | POA: Diagnosis not present

## 2018-06-28 DIAGNOSIS — Z01818 Encounter for other preprocedural examination: Secondary | ICD-10-CM | POA: Diagnosis not present

## 2018-06-28 DIAGNOSIS — I10 Essential (primary) hypertension: Secondary | ICD-10-CM | POA: Diagnosis not present

## 2018-06-28 DIAGNOSIS — N390 Urinary tract infection, site not specified: Secondary | ICD-10-CM | POA: Diagnosis not present

## 2018-06-28 DIAGNOSIS — N281 Cyst of kidney, acquired: Secondary | ICD-10-CM | POA: Diagnosis not present

## 2018-06-28 HISTORY — DX: Cyst of kidney, acquired: N28.1

## 2018-06-28 HISTORY — DX: Tinnitus, unspecified ear: H93.19

## 2018-06-28 LAB — BASIC METABOLIC PANEL
Anion gap: 6 (ref 5–15)
BUN: 14 mg/dL (ref 8–23)
CO2: 27 mmol/L (ref 22–32)
Calcium: 8.9 mg/dL (ref 8.9–10.3)
Chloride: 106 mmol/L (ref 98–111)
Creatinine, Ser: 1.13 mg/dL (ref 0.61–1.24)
GFR calc Af Amer: >60 mL/min (ref >60)
Glucose, Bld: 104 mg/dL — ABNORMAL HIGH (ref 70–99)
POTASSIUM: 3.9 mmol/L (ref 3.5–5.1)
Sodium: 139 mmol/L (ref 135–145)

## 2018-06-28 LAB — CBC
HCT: 43.1 % (ref 39.0–52.0)
HEMOGLOBIN: 14.6 g/dL (ref 13.0–17.0)
MCH: 31 pg (ref 26.0–34.0)
MCHC: 33.9 g/dL (ref 30.0–36.0)
MCV: 91.5 fL (ref 80.0–100.0)
Platelets: 272 10*3/uL (ref 150–400)
RBC: 4.71 MIL/uL (ref 4.22–5.81)
RDW: 11.8 % (ref 11.5–15.5)
WBC: 6.1 10*3/uL (ref 4.0–10.5)
nRBC: 0 % (ref 0.0–0.2)

## 2018-06-30 NOTE — Progress Notes (Signed)
Anesthesia Chart Review   Case:  [id number] Date/Time:  07/04/18 0700   Procedure:  XI ROBOTIC ASSISTED LAPAROSCOPIC NEPHRECTOMY (Left ) - 3 HRS PROCEDURE: ROBOTIC RENAL CYST DECORTICATION   Anesthesia type:  General   Pre-op diagnosis:  LEFT RENAL CYSTS   Location:  [facility] ROOM 03 / [facility]   Surgeon:  [name], MD      DISCUSSION: 63 yo never smoker with h/o A-fib, HTN, HLD, OSA w/o CPAP, CAD, bradycardia, left renal cysts scheduled for above procedure 07/04/18 with Dr. Nicolette Bang.   Pt last seen by cardiology, Dr. Rozann Lesches, on 03/09/2018.  A-fib on flecainide, CHADS2VASC score 2 and pt has declined anticoagulation. Stable at this visit. Pt remains active reports cycling regularly.   Pt can proceed with planned procedure barring acute status change.  VS: BP (!) 153/81   Pulse 61   Temp 36.4 C (Oral)   Resp 18   Ht 6\' 2"  (1.88 m)   Wt 132 kg   SpO2 96%   BMI 37.36 kg/m   PROVIDERS: Sinda Du, MD is PCP   Rozann Lesches, MD is Cardiologist  LABS: Labs reviewed: Acceptable for surgery. (all labs ordered are listed, but only abnormal results are displayed)  Labs Reviewed  BASIC METABOLIC PANEL - Abnormal; Notable for the following components:      Result Value   Glucose, Bld 104 (*)    All other components within normal limits  CBC     IMAGES:   EKG: 06/28/2018 Rate 57 bpm Sinus bradycardia Left ventricular hypertrophy with QRS widening Abnormal ECG  CV: Echo 11/18/2016  Study Conclusions  - Left ventricle: The cavity size was normal. Wall thickness was   increased in a pattern of mild LVH. Systolic function was normal.   The estimated ejection fraction was in the range of 55% to 60%.   Wall motion was normal; there were no regional wall motion   abnormalities. Left ventricular diastolic function parameters   were normal. - Aortic valve: Mildly calcified annulus. Trileaflet; mildly   thickened leaflets. Valve area (VTI): 3.42  cm^2. Valve area   (Vmax): 3.19 cm^2. Valve area (Vmean): 2.93 cm^2. - Mitral valve: Mildly calcified annulus. Mildly thickened leaflets  Stress Test 03/04/15  There was no ST segment deviation noted during stress.  No evidence of ischemia. Normal exercise treadmill stress test.  [facility] treadmill score of 9 portends a low risk of major adverse cardiac events.    Past Medical History:  Diagnosis Date  . Atrial fibrillation ([place])    Diagnosed 09/2009, on flecainide  . Bradycardia    Event monitor 2010: HR down to 45, asymptomatic.  [name] Coronary atherosclerosis of native coronary artery    Nonobstructive and distal disease by cath 2005.   [name] Essential hypertension, benign   . Hyperlipidemia   . OSA (obstructive sleep apnea)    no device   . Renal cyst   . Ringing in ears   . Sleep apnea     Past Surgical History:  Procedure Laterality Date  . BIOPSY  01/04/2018   Procedure: BIOPSY;  Surgeon: [name], MD;  [facility];  Service: Endoscopy;;  ascending colon   . CARDIAC CATHETERIZATION  2005  . COLONOSCOPY  05/2006   SLF: 3 mm sigmoid: Tubular adenoma removed.  . COLONOSCOPY N/A 05/06/2016   Dr. [name]: two sessile polyps in mid transverse colon and hepatic flexure (tubular adenomas). ext/int hemorrhoids. tortuous left colon.   [name]  COLONOSCOPY WITH PROPOFOL N/A 01/04/2018   Procedure: COLONOSCOPY WITH PROPOFOL;  Surgeon: [name], MD;  [facility];  Service: Endoscopy;  Laterality: N/A;  2:30pm  . KNEE ARTHROSCOPY    . Left anterior cruciate ligament reconstruction    . left shoulder surgery     dr . [name]   . POLYPECTOMY  05/06/2016   Procedure: POLYPECTOMY;  Surgeon: [name], MD;  [facility];  Service: Endoscopy;;  transverse colon polyp, hepatic flexure polyp,   . Right shoulder surgery    . TONSILLECTOMY    . TOTAL KNEE ARTHROPLASTY      MEDICATIONS: . amLODipine (NORVASC) 10 MG tablet  . aspirin 325 MG  tablet  . flecainide (TAMBOCOR) 50 MG tablet  . furosemide (LASIX) 40 MG tablet  . losartan (COZAAR) 100 MG tablet  . omeprazole (PRILOSEC) 20 MG capsule  . pravastatin (PRAVACHOL) 40 MG tablet  . tamsulosin (FLOMAX) 0.4 MG CAPS capsule   No current facility-administered medications for this encounter.     Maia Plan [facility] Pre-Surgical Testing [phone] 06/30/18 11:52 AM

## 2018-06-30 NOTE — Anesthesia Preprocedure Evaluation (Addendum)
Anesthesia Evaluation  Patient identified by MRN, date of birth, ID band Patient awake    Reviewed: Allergy & Precautions, NPO status , Patient's Chart, lab work & pertinent test results  History of Anesthesia Complications Negative for: history of anesthetic complications  Airway Mallampati: II  TM Distance: >3 FB Neck ROM: Full    Dental  (+) Dental Advisory Given, Partial Upper,    Pulmonary sleep apnea ,    Pulmonary exam normal breath sounds clear to auscultation       Cardiovascular hypertension, Pt. on medications + CAD (nonobstructive CAD on cath 2005)  Normal cardiovascular exam+ dysrhythmias (on flecainide) Atrial Fibrillation  Rhythm:Regular Rate:Normal  TTE 7/18: mild LVH, EF 55-60%   Neuro/Psych negative neurological ROS     GI/Hepatic Neg liver ROS, GERD  Medicated and Controlled,  Endo/Other  negative endocrine ROSObese, BMI 37  Renal/GU Renal cysts     Musculoskeletal negative musculoskeletal ROS (+)   Abdominal   Peds  Hematology negative hematology ROS (+)   Anesthesia Other Findings Day of surgery medications reviewed with the patient.  Reproductive/Obstetrics                           Anesthesia Physical Anesthesia Plan  ASA: III  Anesthesia Plan: General   Post-op Pain Management:    Induction: Intravenous  PONV Risk Score and Plan: 2 and Treatment may vary due to age or medical condition, Ondansetron, Dexamethasone and Midazolam  Airway Management Planned: Oral ETT  Additional Equipment:   Intra-op Plan:   Post-operative Plan: Extubation in OR  Informed Consent: I have reviewed the patients History and Physical, chart, labs and discussed the procedure including the risks, benefits and alternatives for the proposed anesthesia with the patient or authorized representative who has indicated his/her understanding and acceptance.     Dental advisory  given  Plan Discussed with: CRNA  Anesthesia Plan Comments: (See PAT note 06/28/18, Konrad Felix, PA-C)      Anesthesia Quick Evaluation

## 2018-07-04 ENCOUNTER — Encounter (HOSPITAL_COMMUNITY)
Admission: RE | Disposition: A | Discharge status: Home / Self Care | Payer: Self-pay | Source: Ambulatory Visit | Attending: Urology

## 2018-07-04 ENCOUNTER — Encounter (HOSPITAL_COMMUNITY): Payer: Self-pay | Admitting: *Deleted

## 2018-07-04 ENCOUNTER — Other Ambulatory Visit: Payer: Self-pay

## 2018-07-04 ENCOUNTER — Inpatient Hospital Stay (HOSPITAL_COMMUNITY): Payer: Medicare Other | Admitting: Certified Registered Nurse Anesthetist

## 2018-07-04 ENCOUNTER — Inpatient Hospital Stay (HOSPITAL_COMMUNITY): Payer: Medicare Other | Admitting: Physician Assistant

## 2018-07-04 ENCOUNTER — Observation Stay (HOSPITAL_COMMUNITY)
Admission: RE | Admit: 2018-07-04 | Discharge: 2018-07-05 | Disposition: A | Discharge status: Home / Self Care | Payer: Medicare Other | Source: Ambulatory Visit | Attending: Urology | Admitting: Urology

## 2018-07-04 DIAGNOSIS — I10 Essential (primary) hypertension: Secondary | ICD-10-CM | POA: Diagnosis not present

## 2018-07-04 DIAGNOSIS — E669 Obesity, unspecified: Secondary | ICD-10-CM | POA: Diagnosis not present

## 2018-07-04 DIAGNOSIS — Z881 Allergy status to other antibiotic agents status: Secondary | ICD-10-CM | POA: Diagnosis not present

## 2018-07-04 DIAGNOSIS — G4730 Sleep apnea, unspecified: Secondary | ICD-10-CM | POA: Diagnosis not present

## 2018-07-04 DIAGNOSIS — N281 Cyst of kidney, acquired: Secondary | ICD-10-CM

## 2018-07-04 DIAGNOSIS — Z6837 Body mass index (BMI) 37.0-37.9, adult: Secondary | ICD-10-CM | POA: Insufficient documentation

## 2018-07-04 DIAGNOSIS — I2510 Atherosclerotic heart disease of native coronary artery without angina pectoris: Secondary | ICD-10-CM | POA: Insufficient documentation

## 2018-07-04 DIAGNOSIS — E785 Hyperlipidemia, unspecified: Secondary | ICD-10-CM | POA: Insufficient documentation

## 2018-07-04 DIAGNOSIS — Z86010 Personal history of colonic polyps: Secondary | ICD-10-CM | POA: Insufficient documentation

## 2018-07-04 DIAGNOSIS — K219 Gastro-esophageal reflux disease without esophagitis: Secondary | ICD-10-CM | POA: Diagnosis not present

## 2018-07-04 DIAGNOSIS — Z79899 Other long term (current) drug therapy: Secondary | ICD-10-CM | POA: Diagnosis not present

## 2018-07-04 DIAGNOSIS — G4733 Obstructive sleep apnea (adult) (pediatric): Secondary | ICD-10-CM | POA: Diagnosis not present

## 2018-07-04 DIAGNOSIS — Z880 Allergy status to penicillin: Secondary | ICD-10-CM | POA: Insufficient documentation

## 2018-07-04 DIAGNOSIS — Z7982 Long term (current) use of aspirin: Secondary | ICD-10-CM | POA: Insufficient documentation

## 2018-07-04 DIAGNOSIS — I4891 Unspecified atrial fibrillation: Secondary | ICD-10-CM | POA: Diagnosis not present

## 2018-07-04 HISTORY — PX: ROBOT ASSISTED LAPAROSCOPIC NEPHRECTOMY: SHX5140

## 2018-07-04 LAB — TYPE AND SCREEN
ABO/RH(D): A POS
Antibody Screen: NEGATIVE

## 2018-07-04 LAB — HEMOGLOBIN AND HEMATOCRIT, BLOOD
HCT: 40.3 % (ref 39.0–52.0)
HEMOGLOBIN: 13.4 g/dL (ref 13.0–17.0)

## 2018-07-04 SURGERY — NEPHRECTOMY, RADICAL, ROBOT-ASSISTED, LAPAROSCOPIC, ADULT
Anesthesia: General | Laterality: Left

## 2018-07-04 MED ORDER — FLECAINIDE ACETATE 50 MG PO TABS
50.0000 mg | ORAL_TABLET | Freq: Two times a day (BID) | ORAL | Status: DC
  Administered 2018-07-04 – 2018-07-05 (×2): 50 mg via ORAL
  Filled 2018-07-04 (×2): qty 1

## 2018-07-04 MED ORDER — FENTANYL CITRATE (PF) 250 MCG/5ML IJ SOLN
INTRAMUSCULAR | Status: AC
  Filled 2018-07-04: qty 5

## 2018-07-04 MED ORDER — BACITRACIN-NEOMYCIN-POLYMYXIN 400-5-5000 EX OINT: 1.0000 "application " | TOPICAL_OINTMENT | Freq: Three times a day (TID) | CUTANEOUS | Status: DC | PRN

## 2018-07-04 MED ORDER — FUROSEMIDE 40 MG PO TABS: 40.0000 mg | ORAL_TABLET | Freq: Every day | ORAL | Status: DC | PRN

## 2018-07-04 MED ORDER — HYDROMORPHONE HCL 1 MG/ML IJ SOLN: 0.5000 mg | INTRAMUSCULAR | Status: DC | PRN

## 2018-07-04 MED ORDER — HYDROCODONE-ACETAMINOPHEN 5-325 MG PO TABS: 1.0000 | ORAL_TABLET | ORAL | Status: DC | PRN

## 2018-07-04 MED ORDER — TAMSULOSIN HCL 0.4 MG PO CAPS: 0.4000 mg | ORAL_CAPSULE | ORAL | Status: DC

## 2018-07-04 MED ORDER — MIDAZOLAM HCL 5 MG/5ML IJ SOLN
INTRAMUSCULAR | Status: DC | PRN
  Administered 2018-07-04: 2 mg via INTRAVENOUS

## 2018-07-04 MED ORDER — PROMETHAZINE HCL 25 MG/ML IJ SOLN: 6.2500 mg | INTRAMUSCULAR | Status: DC | PRN

## 2018-07-04 MED ORDER — LIDOCAINE 2% (20 MG/ML) 5 ML SYRINGE
INTRAMUSCULAR | Status: AC
  Filled 2018-07-04: qty 5

## 2018-07-04 MED ORDER — DIPHENHYDRAMINE HCL 50 MG/ML IJ SOLN: 12.5000 mg | Freq: Four times a day (QID) | INTRAMUSCULAR | Status: DC | PRN

## 2018-07-04 MED ORDER — LIDOCAINE 2% (20 MG/ML) 5 ML SYRINGE
INTRAMUSCULAR | Status: DC | PRN
  Administered 2018-07-04: 60 mg via INTRAVENOUS

## 2018-07-04 MED ORDER — DIPHENHYDRAMINE HCL 12.5 MG/5ML PO ELIX: 12.5000 mg | ORAL_SOLUTION | Freq: Four times a day (QID) | ORAL | Status: DC | PRN

## 2018-07-04 MED ORDER — ONDANSETRON HCL 4 MG/2ML IJ SOLN
INTRAMUSCULAR | Status: DC | PRN
  Administered 2018-07-04: 4 mg via INTRAVENOUS

## 2018-07-04 MED ORDER — STERILE WATER FOR IRRIGATION IR SOLN
Status: DC | PRN
  Administered 2018-07-04: 1000 mL

## 2018-07-04 MED ORDER — MIDAZOLAM HCL 2 MG/2ML IJ SOLN
INTRAMUSCULAR | Status: AC
  Filled 2018-07-04: qty 2

## 2018-07-04 MED ORDER — ACETAMINOPHEN 325 MG PO TABS
650.0000 mg | ORAL_TABLET | ORAL | Status: DC | PRN
  Administered 2018-07-04 – 2018-07-05 (×2): 650 mg via ORAL
  Filled 2018-07-04 (×2): qty 2

## 2018-07-04 MED ORDER — AMLODIPINE BESYLATE 10 MG PO TABS
10.0000 mg | ORAL_TABLET | Freq: Every day | ORAL | Status: DC
  Administered 2018-07-05: 10 mg via ORAL
  Filled 2018-07-04: qty 1

## 2018-07-04 MED ORDER — BELLADONNA ALKALOIDS-OPIUM 16.2-60 MG RE SUPP: 1.0000 | Freq: Four times a day (QID) | RECTAL | Status: DC | PRN

## 2018-07-04 MED ORDER — LOSARTAN POTASSIUM 50 MG PO TABS
100.0000 mg | ORAL_TABLET | Freq: Every day | ORAL | Status: DC
  Administered 2018-07-04 – 2018-07-05 (×2): 100 mg via ORAL
  Filled 2018-07-04 (×2): qty 2

## 2018-07-04 MED ORDER — SUGAMMADEX SODIUM 200 MG/2ML IV SOLN
INTRAVENOUS | Status: DC | PRN
  Administered 2018-07-04: 500 mg via INTRAVENOUS

## 2018-07-04 MED ORDER — SODIUM CHLORIDE (PF) 0.9 % IJ SOLN
INTRAMUSCULAR | Status: AC
  Filled 2018-07-04: qty 20

## 2018-07-04 MED ORDER — SUGAMMADEX SODIUM 500 MG/5ML IV SOLN
INTRAVENOUS | Status: AC
  Filled 2018-07-04: qty 5

## 2018-07-04 MED ORDER — OXYCODONE HCL 5 MG/5ML PO SOLN: 5.0000 mg | Freq: Once | ORAL | Status: DC | PRN

## 2018-07-04 MED ORDER — PANTOPRAZOLE SODIUM 40 MG PO TBEC
40.0000 mg | DELAYED_RELEASE_TABLET | Freq: Every day | ORAL | Status: DC
  Filled 2018-07-04: qty 1

## 2018-07-04 MED ORDER — PHENYLEPHRINE 40 MCG/ML (10ML) SYRINGE FOR IV PUSH (FOR BLOOD PRESSURE SUPPORT)
PREFILLED_SYRINGE | INTRAVENOUS | Status: DC | PRN
  Administered 2018-07-04: 120 ug via INTRAVENOUS
  Administered 2018-07-04 (×2): 80 ug via INTRAVENOUS

## 2018-07-04 MED ORDER — BUPIVACAINE LIPOSOME 1.3 % IJ SUSP
20.0000 mL | Freq: Once | INTRAMUSCULAR | Status: AC
  Administered 2018-07-04: 20 mL
  Filled 2018-07-04: qty 20

## 2018-07-04 MED ORDER — ONDANSETRON HCL 4 MG/2ML IJ SOLN
INTRAMUSCULAR | Status: AC
  Filled 2018-07-04: qty 2

## 2018-07-04 MED ORDER — HYDROMORPHONE HCL 1 MG/ML IJ SOLN
INTRAMUSCULAR | Status: AC
  Filled 2018-07-04: qty 1

## 2018-07-04 MED ORDER — SODIUM CHLORIDE 0.9 % IV SOLN
INTRAVENOUS | Status: DC | PRN
  Administered 2018-07-04: 08:00:00 via INTRAVENOUS

## 2018-07-04 MED ORDER — ONDANSETRON HCL 4 MG/2ML IJ SOLN: 4.0000 mg | INTRAMUSCULAR | Status: DC | PRN

## 2018-07-04 MED ORDER — LACTATED RINGERS IV SOLN
INTRAVENOUS | Status: DC
  Administered 2018-07-04 (×2): via INTRAVENOUS

## 2018-07-04 MED ORDER — PROPOFOL 10 MG/ML IV BOLUS
INTRAVENOUS | Status: DC | PRN
  Administered 2018-07-04: 200 mg via INTRAVENOUS

## 2018-07-04 MED ORDER — DEXTROSE-NACL 5-0.45 % IV SOLN
INTRAVENOUS | Status: DC
  Administered 2018-07-04 (×2): via INTRAVENOUS

## 2018-07-04 MED ORDER — OXYCODONE HCL 5 MG PO TABS: 5.0000 mg | ORAL_TABLET | Freq: Once | ORAL | Status: DC | PRN

## 2018-07-04 MED ORDER — ROCURONIUM BROMIDE 10 MG/ML (PF) SYRINGE
PREFILLED_SYRINGE | INTRAVENOUS | Status: DC | PRN
  Administered 2018-07-04: 70 mg via INTRAVENOUS
  Administered 2018-07-04: 10 mg via INTRAVENOUS

## 2018-07-04 MED ORDER — FENTANYL CITRATE (PF) 100 MCG/2ML IJ SOLN
INTRAMUSCULAR | Status: DC | PRN
  Administered 2018-07-04 (×3): 50 ug via INTRAVENOUS
  Administered 2018-07-04: 100 ug via INTRAVENOUS

## 2018-07-04 MED ORDER — ROCURONIUM BROMIDE 100 MG/10ML IV SOLN
INTRAVENOUS | Status: AC
  Filled 2018-07-04: qty 1

## 2018-07-04 MED ORDER — DEXAMETHASONE SODIUM PHOSPHATE 4 MG/ML IJ SOLN
INTRAMUSCULAR | Status: DC | PRN
  Administered 2018-07-04: 5 mg via INTRAVENOUS

## 2018-07-04 MED ORDER — ACETAMINOPHEN 10 MG/ML IV SOLN: 1000.0000 mg | Freq: Once | INTRAVENOUS | Status: DC | PRN

## 2018-07-04 MED ORDER — HYDROMORPHONE HCL 1 MG/ML IJ SOLN
0.2500 mg | INTRAMUSCULAR | Status: DC | PRN
  Administered 2018-07-04 (×2): 0.5 mg via INTRAVENOUS

## 2018-07-04 MED ORDER — PRAVASTATIN SODIUM 40 MG PO TABS
40.0000 mg | ORAL_TABLET | Freq: Every day | ORAL | Status: DC
  Administered 2018-07-04: 40 mg via ORAL
  Filled 2018-07-04: qty 1

## 2018-07-04 MED ORDER — PROPOFOL 10 MG/ML IV BOLUS
INTRAVENOUS | Status: AC
  Filled 2018-07-04: qty 20

## 2018-07-04 MED ORDER — EPHEDRINE SULFATE-NACL 50-0.9 MG/10ML-% IV SOSY
PREFILLED_SYRINGE | INTRAVENOUS | Status: DC | PRN
  Administered 2018-07-04 (×2): 5 mg via INTRAVENOUS
  Administered 2018-07-04: 7.5 mg via INTRAVENOUS

## 2018-07-04 MED ORDER — DEXAMETHASONE SODIUM PHOSPHATE 10 MG/ML IJ SOLN
INTRAMUSCULAR | Status: AC
  Filled 2018-07-04: qty 1

## 2018-07-04 MED ORDER — EPHEDRINE 5 MG/ML INJ
INTRAVENOUS | Status: AC
  Filled 2018-07-04: qty 10

## 2018-07-04 MED ORDER — LACTATED RINGERS IR SOLN
Status: DC | PRN
  Administered 2018-07-04: 1000 mL

## 2018-07-04 MED ORDER — GENTAMICIN SULFATE 40 MG/ML IJ SOLN
2.0000 mg/kg | Freq: Once | INTRAVENOUS | Status: AC
  Administered 2018-07-04: 264 mg via INTRAVENOUS
  Filled 2018-07-04: qty 6.5

## 2018-07-04 MED ORDER — HYDROCODONE-ACETAMINOPHEN 5-325 MG PO TABS: 1.0000 | ORAL_TABLET | Freq: Four times a day (QID) | ORAL | 0 refills | Status: DC | PRN

## 2018-07-04 SURGICAL SUPPLY — 57 items
ADH SKN CLS APL DERMABOND .7 (GAUZE/BANDAGES/DRESSINGS) ×1
BAG LAPAROSCOPIC 12 15 PORT 16 (BASKET) IMPLANT
BAG RETRIEVAL 12/15 (BASKET)
BAG RETRIEVAL 12/15MM (BASKET)
CHLORAPREP W/TINT 26ML (MISCELLANEOUS) ×3 IMPLANT
CLIP VESOLOCK LG 6/CT PURPLE (CLIP) ×1 IMPLANT
CLIP VESOLOCK MED LG 6/CT (CLIP) ×5 IMPLANT
CLIP VESOLOCK XL 6/CT (CLIP) ×3 IMPLANT
COVER SURGICAL LIGHT HANDLE (MISCELLANEOUS) ×3 IMPLANT
COVER TIP SHEARS 8 DVNC (MISCELLANEOUS) ×1 IMPLANT
COVER TIP SHEARS 8MM DA VINCI (MISCELLANEOUS) ×2
COVER WAND RF STERILE (DRAPES) ×2 IMPLANT
CUTTER ECHEON FLEX ENDO 45 340 (ENDOMECHANICALS) IMPLANT
DECANTER SPIKE VIAL GLASS SM (MISCELLANEOUS) ×3 IMPLANT
DERMABOND ADVANCED (GAUZE/BANDAGES/DRESSINGS) ×2
DERMABOND ADVANCED .7 DNX12 (GAUZE/BANDAGES/DRESSINGS) ×2 IMPLANT
DRAPE ARM DVNC X/XI (DISPOSABLE) ×4 IMPLANT
DRAPE COLUMN DVNC XI (DISPOSABLE) ×1 IMPLANT
DRAPE DA VINCI XI ARM (DISPOSABLE) ×8
DRAPE DA VINCI XI COLUMN (DISPOSABLE) ×2
DRAPE INCISE IOBAN 66X45 STRL (DRAPES) ×3 IMPLANT
DRAPE SHEET LG 3/4 BI-LAMINATE (DRAPES) ×3 IMPLANT
ELECT PENCIL ROCKER SW 15FT (MISCELLANEOUS) ×3 IMPLANT
ELECT REM PT RETURN 15FT ADLT (MISCELLANEOUS) ×4 IMPLANT
GLOVE BIO SURGEON STRL SZ 6.5 (GLOVE) ×2 IMPLANT
GLOVE BIO SURGEON STRL SZ8 (GLOVE) ×6 IMPLANT
GLOVE BIO SURGEONS STRL SZ 6.5 (GLOVE) ×1
GLOVE BIOGEL PI IND STRL 8 (GLOVE) ×1 IMPLANT
GLOVE BIOGEL PI INDICATOR 8 (GLOVE) ×2
GOWN STRL REUS W/TWL LRG LVL3 (GOWN DISPOSABLE) ×9 IMPLANT
HEMOSTAT SURGICEL 4X8 (HEMOSTASIS) ×2 IMPLANT
IRRIG SUCT STRYKERFLOW 2 WTIP (MISCELLANEOUS) ×3
IRRIGATION SUCT STRKRFLW 2 WTP (MISCELLANEOUS) ×1 IMPLANT
KIT BASIN OR (CUSTOM PROCEDURE TRAY) ×3 IMPLANT
LOOP VESSEL MAXI BLUE (MISCELLANEOUS) IMPLANT
NDL INSUFFLATION 14GA 120MM (NEEDLE) ×1 IMPLANT
NEEDLE INSUFFLATION 14GA 120MM (NEEDLE) ×3 IMPLANT
PROTECTOR NERVE ULNAR (MISCELLANEOUS) ×6 IMPLANT
RELOAD STAPLE 45 2.6 WHT THIN (STAPLE) IMPLANT
SEAL CANN UNIV 5-8 DVNC XI (MISCELLANEOUS) ×4 IMPLANT
SEAL XI 5MM-8MM UNIVERSAL (MISCELLANEOUS) ×8
SOLUTION ELECTROLUBE (MISCELLANEOUS) ×3 IMPLANT
SPONGE LAP 4X18 RFD (DISPOSABLE) IMPLANT
STAPLE RELOAD 45 WHT (STAPLE) IMPLANT
STAPLE RELOAD 45MM WHITE (STAPLE)
SUT ETHILON 3 0 PS 1 (SUTURE) IMPLANT
SUT MNCRL AB 4-0 PS2 18 (SUTURE) ×6 IMPLANT
SUT PDS AB 1 TP1 96 (SUTURE) ×1 IMPLANT
SUT VIC AB 3-0 SH 27 (SUTURE) ×3
SUT VIC AB 3-0 SH 27XBRD (SUTURE) IMPLANT
SUT VICRYL 0 UR6 27IN ABS (SUTURE) ×4 IMPLANT
TAPE CLOTH 4X10 WHT NS (GAUZE/BANDAGES/DRESSINGS) ×1 IMPLANT
TOWEL OR NON WOVEN STRL DISP B (DISPOSABLE) ×3 IMPLANT
TRAY FOLEY MTR SLVR 16FR STAT (SET/KITS/TRAYS/PACK) ×2 IMPLANT
TRAY LAPAROSCOPIC (CUSTOM PROCEDURE TRAY) ×3 IMPLANT
TROCAR BLADELESS OPT 5 100 (ENDOMECHANICALS) ×3 IMPLANT
TROCAR XCEL 12X100 BLDLESS (ENDOMECHANICALS) ×3 IMPLANT

## 2018-07-04 NOTE — Progress Notes (Signed)
Post-op check  S/p Left robotic renal cyst decortication earlier today  Overall doing well. VSS Hgb 13.4 post op Pain fairly well controlled Incisions clean dry and intact, belly soft

## 2018-07-04 NOTE — Op Note (Addendum)
Preoperative diagnosis: Left renal cysts  Postop diagnosis: Same  Procedure: 1.  Left robot assisted laparoscopic cyst decortication  Attending: Nicolette Bang, MD  Assistant: Debbrah Alar, PA  Resident: Basilio Cairo, MD  Anesthesia: General  Estimated blood loss: 10 cc  Drains: 16 French Foley catheter  Specimens:Left renal cysts  Antibiotics: gentamicin  Findings: 3 lower pole cysts and 1 upper pole cyst. The assistant was utilized for suction, retraction and passing suture  Indications: Patient is a 63 year old with a history of 14 cm left renal cyst and multiple 4-5cm lower pole cysts.   After discussing treatment options patient decided to proceed with left robot assisted laparoscopic renal cyst decortication.  Procedure in detail: Prior to procedure consent was obtained. Patient was brought to the operating room and briefing was done sure correct patient, correct procedure, correct site.  General anesthesia was in administered patient was placed in the right lateral decubitus position.  a 10 French catheter was placed. their abdomen and flank was then prepped and draped usual sterile fashion.  A Veress needle was used to obtain pneumoperitoneum.  Once pneumoperitoneum was reestablished to 15 mmHg we then placed a 8 mm camera port lateral to the umbilicus at the latera; edge of rectus.  We then proceeded to place 3 more robotic ports. We then placed an assistant port. We then docked the robot.   We then dissected along the white line of Toldt.  We then reflected the colon medially.  We then identified the psoas muscle. The then proceeded to defat the kidney and we identified 3 lower pole cysts and a large upper pole cysts. The cysts were then excised and the base of each cyst was fulgerated. One of the lower pole cyst cavities required a 3-0 vicryl figure of eight stitch to obtain hemostasis.   We then removed our instruments, undocked the robot, and released the pneumoperitoneum.  Once the specimen was removed we then closed the camera and assistant ports with 0 Vicryl in interrupted fashion.  The skin was then subcuticularly closed with 4-0 Monocryl.  We then placed Dermabond over all the incisions.  This included the procedure which resulted by the patient.  Complications: None  Condition: Stable, x-rayed, transferred to PACU.  Plan: Patient is to be admitted for inpatient stay. The foley catheter will be removed in the morning. They will be started on a clear liquid diet POD#1

## 2018-07-04 NOTE — Anesthesia Postprocedure Evaluation (Signed)
Anesthesia Post Note  Patient: Jonathan Dyer  Procedure(s) Performed: XI ROBOTIC ASSISTED LAPAROSCOPIC RENAL CYST DECORTICATION (Left )     Patient location during evaluation: PACU Anesthesia Type: General Level of consciousness: awake and alert Pain management: pain level controlled Vital Signs Assessment: post-procedure vital signs reviewed and stable Respiratory status: spontaneous breathing, nonlabored ventilation and respiratory function stable Cardiovascular status: blood pressure returned to baseline and stable Postop Assessment: no apparent nausea or vomiting Anesthetic complications: no    Last Vitals:  Vitals:   07/04/18 1110 07/04/18 1128  BP: (!) 151/70 (!) 163/79  Pulse: 67 66  Resp: 10 15  Temp: (!) 36.3 C (!) 36.4 C  SpO2: 96% 98%    Last Pain:  Vitals:   07/04/18 1209  TempSrc:   PainSc: 0-No pain                 Brennan Bailey

## 2018-07-04 NOTE — Discharge Instructions (Signed)

## 2018-07-04 NOTE — Anesthesia Procedure Notes (Signed)
Procedure Name: Intubation Date/Time: 07/04/2018 7:45 AM Performed by: Claudia Desanctis, CRNA Pre-anesthesia Checklist: Patient identified, Emergency Drugs available, Suction available and Patient being monitored Patient Re-evaluated:Patient Re-evaluated prior to induction Oxygen Delivery Method: Circle system utilized Preoxygenation: Pre-oxygenation with 100% oxygen Induction Type: IV induction Ventilation: Two handed mask ventilation required and Oral airway inserted - appropriate to patient size Laryngoscope Size: 2 and Miller Grade View: Grade I Tube type: Oral Tube size: 7.5 mm Number of attempts: 1 Airway Equipment and Method: Stylet Placement Confirmation: ETT inserted through vocal cords under direct vision,  positive ETCO2 and breath sounds checked- equal and bilateral Secured at: 23 cm Tube secured with: Tape Dental Injury: Teeth and Oropharynx as per pre-operative assessment

## 2018-07-04 NOTE — Transfer of Care (Signed)
Immediate Anesthesia Transfer of Care Note  Patient: Jonathan Dyer  Procedure(s) Performed: XI ROBOTIC ASSISTED LAPAROSCOPIC RENAL CYST DECORTICATION (Left )  Patient Location: PACU  Anesthesia Type:General  Level of Consciousness: awake, alert , oriented and patient cooperative  Airway & Oxygen Therapy: Patient Spontanous Breathing and Patient connected to face mask  Post-op Assessment: Report given to RN and Post -op Vital signs reviewed and stable  Post vital signs: Reviewed and stable  Last Vitals:  Vitals Value Taken Time  BP 127/71 07/04/2018  9:50 AM  Temp    Pulse 78 07/04/2018  9:52 AM  Resp 15 07/04/2018  9:52 AM  SpO2 100 % 07/04/2018  9:52 AM  Vitals shown include unvalidated device data.  Last Pain:  Vitals:   07/04/18 0618  TempSrc:   PainSc: 1       Patients Stated Pain Goal: 0 (15/72/62 0355)  Complications: No apparent anesthesia complications

## 2018-07-04 NOTE — H&P (Signed)
Urology Admission H&P  Chief Complaint: left flank pain  History of Present Illness: Jonathan Dyer is a 64yo with a hx of bilateral renal cysts and left flank pain. The left renal cysts have been growing in size and are currently causing mass effect. He denies any hematuria or dysuria. NO nausea/vomiting.   Past Medical History:  Diagnosis Date  . Atrial fibrillation ([place])    Diagnosed 09/2009, on flecainide  . Bradycardia    Event monitor 2010: HR down to 45, asymptomatic.  [name] Coronary atherosclerosis of native coronary artery    Nonobstructive and distal disease by cath 2005.   [name] Essential hypertension, benign   . Hyperlipidemia   . OSA (obstructive sleep apnea)    no device   . Renal cyst   . Ringing in ears   . Sleep apnea    Past Surgical History:  Procedure Laterality Date  . BIOPSY  01/04/2018   Procedure: BIOPSY;  Surgeon: [name], MD;  [facility];  Service: Endoscopy;;  ascending colon   . CARDIAC CATHETERIZATION  2005  . COLONOSCOPY  05/2006   SLF: 3 mm sigmoid: Tubular adenoma removed.  . COLONOSCOPY N/A 05/06/2016   Dr. [name]: two sessile polyps in mid transverse colon and hepatic flexure (tubular adenomas). ext/int hemorrhoids. tortuous left colon.   . COLONOSCOPY WITH PROPOFOL N/A 01/04/2018   Procedure: COLONOSCOPY WITH PROPOFOL;  Surgeon: [name], MD;  [facility];  Service: Endoscopy;  Laterality: N/A;  2:30pm  . KNEE ARTHROSCOPY    . Left anterior cruciate ligament reconstruction    . left shoulder surgery     dr . [name]   . POLYPECTOMY  05/06/2016   Procedure: POLYPECTOMY;  Surgeon: [name], MD;  [facility];  Service: Endoscopy;;  transverse colon polyp, hepatic flexure polyp,   . Right shoulder surgery    . TONSILLECTOMY    . TOTAL KNEE ARTHROPLASTY      Home Medications:  Current Facility-Administered Medications  Medication Dose Route Frequency Provider Last Rate Last Dose  .  gentamicin (GARAMYCIN) 260 mg in dextrose 5 % 50 mL IVPB  2 mg/kg Intravenous Once [name], MD      . lactated ringers infusion   Intravenous Continuous Brennan Bailey, MD 100 mL/hr at 07/04/18 1610     Allergies:  Allergies  Allergen Reactions  . Clindamycin/Lincomycin Swelling and Other (See Comments)    Mycin drug family   . Penicillins Other (See Comments)    Has patient had a PCN reaction causing immediate rash, facial/tongue/throat swelling, SOB or lightheadedness with hypotension: unknown Has patient had a PCN reaction causing severe rash involving mucus membranes or skin necrosis: unknown Has patient had a PCN reaction that required hospitalization unknown Has patient had a PCN reaction occurring within the last 10 years: unknown If all of the above answers are "NO", then may proceed     Family History  Problem Relation Age of Onset  . Pulmonary embolism Mother   . Lung disease Father   . Hyperlipidemia Brother   . Hypertension Brother   . Diabetes Other        Significant family h/o DM  . Colon cancer Neg Hx    Social History:  reports that he has never smoked. He has never used smokeless tobacco. He reports that he does not drink alcohol or use drugs.  Review of Systems  Genitourinary: Positive for flank pain.  All other  systems reviewed and are negative.   Physical Exam:  Vital signs in last 24 hours: Temp:  [97.9 F (36.6 C)] 97.9 F (36.6 C) (02/24 0551) Pulse Rate:  [67] 67 (02/24 0551) Resp:  [18] 18 (02/24 0551) BP: (145)/(85) 145/85 (02/24 0551) SpO2:  [97 %] 97 % (02/24 0551) Physical Exam  Constitutional: He is oriented to person, place, and time. He appears well-developed and well-nourished.  HENT:  Head: Normocephalic and atraumatic.  Eyes: Pupils are equal, round, and reactive to light. EOM are normal.  Neck: Normal range of motion. No thyromegaly present.  Cardiovascular: Normal rate and regular rhythm.  Respiratory: Effort normal.  No respiratory distress.  GI: Soft. He exhibits no distension.  Musculoskeletal: Normal range of motion.        General: No edema.  Neurological: He is alert and oriented to person, place, and time.  Skin: Skin is warm and dry.  Psychiatric: He has a normal mood and affect. His behavior is normal. Judgment and thought content normal.    Laboratory Data:  No results found for this or any previous visit (from the past 24 hour(s)). No results found for this or any previous visit (from the past 240 hour(s)). Creatinine: Recent Labs    06/28/18 0909  CREATININE 1.13   Baseline Creatinine: 1.1  Impression/Assessment:  63yo with left renal cysts  Plan:  The risks/benefits/alternatives to left renal cyst decortication was explained tot he patient and he understands and wishes to proceed with surgery  Jonathan Dyer 07/04/2018, 7:33 AM

## 2018-07-05 ENCOUNTER — Encounter (HOSPITAL_COMMUNITY): Payer: Self-pay | Admitting: Urology

## 2018-07-05 DIAGNOSIS — I10 Essential (primary) hypertension: Secondary | ICD-10-CM | POA: Diagnosis not present

## 2018-07-05 DIAGNOSIS — G4733 Obstructive sleep apnea (adult) (pediatric): Secondary | ICD-10-CM | POA: Diagnosis not present

## 2018-07-05 DIAGNOSIS — I2510 Atherosclerotic heart disease of native coronary artery without angina pectoris: Secondary | ICD-10-CM | POA: Diagnosis not present

## 2018-07-05 DIAGNOSIS — I4891 Unspecified atrial fibrillation: Secondary | ICD-10-CM | POA: Diagnosis not present

## 2018-07-05 DIAGNOSIS — Z79899 Other long term (current) drug therapy: Secondary | ICD-10-CM | POA: Diagnosis not present

## 2018-07-05 DIAGNOSIS — N281 Cyst of kidney, acquired: Secondary | ICD-10-CM | POA: Diagnosis not present

## 2018-07-05 LAB — BASIC METABOLIC PANEL
Anion gap: 5 (ref 5–15)
BUN: 11 mg/dL (ref 8–23)
CO2: 24 mmol/L (ref 22–32)
Calcium: 8.1 mg/dL — ABNORMAL LOW (ref 8.9–10.3)
Chloride: 108 mmol/L (ref 98–111)
Creatinine, Ser: 1.06 mg/dL (ref 0.61–1.24)
GFR calc Af Amer: >60 mL/min (ref >60)
GFR calc non Af Amer: >60 mL/min (ref >60)
GLUCOSE: 128 mg/dL — AB (ref 70–99)
Potassium: 3.9 mmol/L (ref 3.5–5.1)
Sodium: 137 mmol/L (ref 135–145)

## 2018-07-05 LAB — HEMOGLOBIN AND HEMATOCRIT, BLOOD
HCT: 38.3 % — ABNORMAL LOW (ref 39.0–52.0)
Hemoglobin: 12.9 g/dL — ABNORMAL LOW (ref 13.0–17.0)

## 2018-07-05 MED ORDER — SENNOSIDES-DOCUSATE SODIUM 8.6-50 MG PO TABS: 1.0000 | ORAL_TABLET | Freq: Every day | ORAL | 0 refills | Status: DC

## 2018-07-05 NOTE — Care Management CC44 (Signed)
Condition Code 44 Documentation Completed  Patient Details  Name: Jonathan Dyer MRN: [id number] Date of Birth: 1955-12-28   Condition Code 44 given:  Yes Patient signature on Condition Code 44 notice:  Yes Documentation of 2 MD's agreement:  Yes Code 44 added to claim:  Yes    Elster Corbello, RN 07/05/2018, 1:05 PM

## 2018-07-05 NOTE — Care Management Note (Signed)
Case Management Note  Patient Details  Name: Jonathan Dyer MRN: [id number] Date of Birth: 1955-11-12  Subjective/Objective:                    Action/Plan: Plan to discharge home with no needs.    Expected Discharge Date:  07/05/18               Expected Discharge Plan:  Home/Self Care  In-House Referral:     Discharge planning Services  CM Consult  Post Acute Care Choice:    Choice offered to:     DME Arranged:    DME Agency:     HH Arranged:    HH Agency:     Status of Service:  Completed, signed off  If discussed at [employer] of Stay Meetings, dates discussed:    Additional CommentsPurcell Mouton, RN 07/05/2018, 12:03 PM

## 2018-07-05 NOTE — Progress Notes (Signed)
Urology Progress Note   1 Day Post-Op  Subjective: NAEON.  Some issues with bladder spasms overnight, chief complaint this AM Abdominal discomfort and incisional pain minimal Excellent UOP. Lab work stable this morning Tolerating clears, ambulated once  Objective: Vital signs in last 24 hours: Temp:  [96.6 F (35.9 C)-98.5 F (36.9 C)] 98 F (36.7 C) (02/25 0344) Pulse Rate:  [61-79] 61 (02/25 0344) Resp:  [7-20] 18 (02/25 0344) BP: (121-163)/(63-79) 139/71 (02/25 0344) SpO2:  [94 %-100 %] 96 % (02/25 0344) Weight:  [132.9 kg] 132.9 kg (02/24 1128)  Intake/Output from previous day: 02/24 0701 - 02/25 0700 In: 4289 [P.O.:480; I.V.:3759; IV Piggyback:50] Out: 5379 [Urine:4075; Blood:50] Intake/Output this shift: No intake/output data recorded.  Physical Exam:  General: Alert and oriented CV: RRR Lungs: Clear Abdomen: Soft, appropriately tender. Port incisions clean dry and intact GU: Foley in place draining clear yellow urine  Ext: NT, No erythema  Lab Results: Recent Labs    07/04/18 1036 07/05/18 0431  HGB 13.4 12.9*  HCT 40.3 38.3*   BMET Recent Labs    07/05/18 0431  NA 137  K 3.9  CL 108  CO2 24  GLUCOSE 128*  BUN 11  CREATININE 1.06  CALCIUM 8.1*     Studies/Results: No results found.  Assessment/Plan:  63 y.o. male s/p Left robotic renal cyst decortication.  Overall doing well post-op.   - Regular diet, medlock - Discontinue foley catheter, follow up TOV - Ambulate - Hold on narcotics this AM - Tentative plan for discharge later this morning/afternoon. Patient may want to drive himself home which is okay if off narcotics   Dispo: Anticipate discharge home today   LOS: 1 day   Fredricka Bonine 07/05/2018, 7:43 AM

## 2018-07-05 NOTE — Progress Notes (Signed)
Patient's foley catheter discontinued at [place]. Patient ambulated around the unit. At [place] patient voided 75cc and bladder scanned patient which yielded 75ml the first time and 59ml the second.  Patient up in the chair.  Will continue to monitor.

## 2018-07-05 NOTE — Care Management Obs Status (Signed)
[place] NOTIFICATION   Patient Details  Name: Jonathan Dyer MRN: [id number] Date of Birth: Sep 12, 1955   Medicare Observation Status Notification Given:  Yes    [name] Mouton, RN 07/05/2018, 11:40 AM

## 2018-07-05 NOTE — Discharge Summary (Signed)
[facility] Discharge Summary  Admit date: 07/04/2018  Discharge date and time: 07/05/18   Discharge to: Home  Discharge Service: Urology  Discharge Attending Physician:  Dr. Alyson Ingles  Discharge  Diagnoses: <principal problem not specified>  Secondary Diagnosis: Active Problems:   Renal cyst   OR Procedures: Procedure(s): XI ROBOTIC ASSISTED LAPAROSCOPIC RENAL CYST DECORTICATION 07/04/2018   Ancillary Procedures: None   Discharge Day Services: The patient was seen and examined by the Urology team both in the morning and immediately prior to discharge.  Vital signs and laboratory values were stable and within normal limits.  The physical exam was benign and unchanged and all surgical wounds were examined.  Discharge instructions were explained and all questions answered.  Subjective  No acute events overnight. Pain Controlled. No fever or chills.  Objective Patient Vitals for the past 8 hrs:  BP Temp Temp src Pulse Resp SpO2  07/05/18 1029 138/87 98.5 F (36.9 C) Oral 65 (!) 23 96 %   Total I/O In: 1430 [P.O.:1430] Out: 975 [Urine:975]  General Appearance:        No acute distress Lungs:                       Normal work of breathing on room air Heart:                                Regular rate and rhythm Abdomen:                         Soft, non-tender, non-distended. Port incisions clean dry and intact Extremities:                      Warm and well perfused   Hospital Course:  The patient underwent a left robotic renal cysts decortication on 07/04/2018.  The patient tolerated the procedure well, was extubated in the OR, and afterwards was taken to the PACU for routine post-surgical care. When stable the patient was transferred to the floor.  The patient did well postoperatively.  The patient's diet was slowly advanced and at the time of discharge was tolerating a regular diet.  The patient was discharged home 1 Day Post-Op, at which point was tolerating a regular  solid diet, was able to void spontaneously, have adequate pain control with P.O. pain medication, and could ambulate without difficulty. The patient will follow up with [place] for post op check.   Condition at Discharge: Improved  Discharge Medications:  Allergies as of 07/05/2018      Reactions   Clindamycin/lincomycin Swelling, Other (See Comments)   Mycin drug family    Penicillins Other (See Comments)   Has patient had a PCN reaction causing immediate rash, facial/tongue/throat swelling, SOB or lightheadedness with hypotension: unknown Has patient had a PCN reaction causing severe rash involving mucus membranes or skin necrosis: unknown Has patient had a PCN reaction that required hospitalization unknown Has patient had a PCN reaction occurring within the last 10 years: unknown If all of the above answers are "NO", then may proceed       Medication List    STOP taking these medications   aspirin 325 MG tablet     TAKE these medications   amLODipine 10 MG tablet Commonly known as:  NORVASC Take 10 mg by mouth daily.   flecainide 50 MG tablet Commonly known as:  TAMBOCOR TAKE 1 TABLET BY MOUTH  TWICE A DAY   furosemide 40 MG tablet Commonly known as:  LASIX Take 40 mg by mouth daily as needed for fluid.   HYDROcodone-acetaminophen 5-325 MG tablet Commonly known as:  NORCO Take 1-2 tablets by mouth every 6 (six) hours as needed.   losartan 100 MG tablet Commonly known as:  COZAAR Take 100 mg by mouth daily.   omeprazole 20 MG capsule Commonly known as:  PRILOSEC TAKE 1 CAPSULE BY MOUTH  DAILY   pravastatin 40 MG tablet Commonly known as:  PRAVACHOL Take 40 mg by mouth at bedtime.   senna-docusate 8.6-50 MG tablet Commonly known as:  Senokot-S Take 1 tablet by mouth daily.   tamsulosin 0.4 MG Caps capsule Commonly known as:  FLOMAX Take 0.4 mg by mouth every other day.

## 2018-07-15 DIAGNOSIS — N281 Cyst of kidney, acquired: Secondary | ICD-10-CM | POA: Diagnosis not present

## 2018-08-03 ENCOUNTER — Telehealth: Payer: Self-pay | Admitting: Gastroenterology

## 2018-08-03 DIAGNOSIS — M1711 Unilateral primary osteoarthritis, right knee: Secondary | ICD-10-CM | POA: Diagnosis not present

## 2018-08-03 NOTE — Telephone Encounter (Signed)
Please NIC for MRI abdomen with and without contrast in 02/2019 to reevaluate pancreatic cystic lesions.

## 2018-08-04 NOTE — Telephone Encounter (Signed)
Patient added to recall

## 2018-09-02 ENCOUNTER — Telehealth: Payer: Self-pay

## 2018-09-02 NOTE — Telephone Encounter (Signed)
   Cardiac Questionnaire:    Since your last visit or hospitalization:    1. Have you been having new or worsening chest pain? No   2. Have you been having new or worsening shortness of breath? No 3. Have you been having new or worsening leg swelling, wt gain, or increase in abdominal girth (pants fitting more tightly)? No   4. Have you had any passing out spells? No      Patient decline telehealth apt, would like to be rescheduled in a few months

## 2018-09-02 NOTE — Progress Notes (Deleted)
{Choose 1 Note Type (Telehealth Visit or Telephone Visit):[phone]}   Evaluation Performed:  Follow-up visit  Date:  09/02/2018   ID:  Jonathan Dyer, Jonathan Dyer August 21, 1955, MRN [id number]  {Patient [phone]::"Home"} {Provider [phone]}  PCP:  Sinda Du, MD  Cardiologist:  Rozann Lesches, MD  Chief Complaint:  ***  History of Present Illness:    Jonathan Dyer is a 63 y.o. male last seen in October 2019.  CHADSVASC score is 2, he has declined anticoagulation so far.  The patient {does/does not:200015} have symptoms concerning for COVID-19 infection (fever, chills, cough, or new shortness of breath).    Past Medical History:  Diagnosis Date  . Atrial fibrillation ([place])    Diagnosed 09/2009, on flecainide  . Bradycardia    Event monitor 2010: HR down to 45, asymptomatic.  [name] Coronary atherosclerosis of native coronary artery    Nonobstructive and distal disease by cath 2005.   [name] Essential hypertension, benign   . Hyperlipidemia   . OSA (obstructive sleep apnea)    no device   . Renal cyst   . Ringing in ears   . Sleep apnea    Past Surgical History:  Procedure Laterality Date  . BIOPSY  01/04/2018   Procedure: BIOPSY;  Surgeon: [name], MD;  [facility];  Service: Endoscopy;;  ascending colon   . CARDIAC CATHETERIZATION  2005  . COLONOSCOPY  05/2006   SLF: 3 mm sigmoid: Tubular adenoma removed.  . COLONOSCOPY N/A 05/06/2016   Dr. [name]: two sessile polyps in mid transverse colon and hepatic flexure (tubular adenomas). ext/int hemorrhoids. tortuous left colon.   . COLONOSCOPY WITH PROPOFOL N/A 01/04/2018   Procedure: COLONOSCOPY WITH PROPOFOL;  Surgeon: [name], MD;  [facility];  Service: Endoscopy;  Laterality: N/A;  2:30pm  . KNEE ARTHROSCOPY    . Left anterior cruciate ligament reconstruction    . left shoulder surgery     dr . [name]   . POLYPECTOMY  05/06/2016   Procedure:  POLYPECTOMY;  Surgeon: [name], MD;  [facility];  Service: Endoscopy;;  transverse colon polyp, hepatic flexure polyp,   . Right shoulder surgery    . ROBOT ASSISTED LAPAROSCOPIC NEPHRECTOMY Left 07/04/2018   Procedure: XI ROBOTIC ASSISTED LAPAROSCOPIC RENAL CYST DECORTICATION;  Surgeon: [name], MD;  [facility];  Service: Urology;  Laterality: Left;  3 HRS PROCEDURE: ROBOTIC RENAL CYST DECORTICATION  . TONSILLECTOMY    . TOTAL KNEE ARTHROPLASTY       No outpatient medications have been marked as taking for the 09/05/18 encounter (Appointment) with Satira Sark, MD.     Allergies:   Clindamycin/lincomycin and Penicillins   Social History   Tobacco Use  . Smoking status: Never Smoker  . Smokeless tobacco: Never Used  Substance Use Topics  . Alcohol use: No    Alcohol/week: 0.0 standard drinks  . Drug use: No     Family Hx: The patient's family history includes Diabetes in an other family member; Hyperlipidemia in his brother; Hypertension in his brother; Lung disease in his father; Pulmonary embolism in his mother. There is no history of Colon cancer.  ROS:   Please see the history of present illness.    *** All other systems reviewed and are negative.   Prior CV studies:   The following studies were reviewed today:  Echocardiogram 11/18/2016: Study Conclusions  - Left ventricle: The cavity size was normal. Wall thickness was  increased in a pattern of mild LVH. Systolic function was normal. The estimated ejection fraction was in the range of 55% to 60%. Wall motion was normal; there were no regional wall motion abnormalities. Left ventricular diastolic function parameters were normal. - Aortic valve: Mildly calcified annulus. Trileaflet; mildly thickened leaflets. Valve area (VTI): 3.42 cm^2. Valve area (Vmax): 3.19 cm^2. Valve area (Vmean): 2.93 cm^2. - Mitral valve: Mildly calcified annulus. Mildly thickened  leaflets. - Technically adequate study.  Labs/Other Tests and Data Reviewed:    EKG:  An ECG dated 06/28/2018 was personally reviewed today and demonstrated:  Sinus bradycardia with increased voltage, normal QRS and QTc intervals.  Recent Labs: 06/28/2018: Platelets 272 07/05/2018: BUN 11; Creatinine, Ser 1.06; Hemoglobin 12.9; Potassium 3.9; Sodium 137   Recent Lipid Panel No results found for: CHOL, TRIG, HDL, CHOLHDL, LDLCALC, LDLDIRECT  Wt Readings from Last 3 Encounters:  07/04/18 292 lb 15.9 oz (132.9 kg)  06/28/18 291 lb (132 kg)  03/09/18 279 lb (126.6 kg)     Objective:    Vital Signs:  There were no vitals taken for this visit.   {HeartCare Virtual Exam (Optional):[phone]::"VITAL SIGNS:  reviewed"}  ASSESSMENT & PLAN:    1. ***  COVID-19 Education: The signs and symptoms of COVID-19 were discussed with the patient and how to seek care for testing (follow up with PCP or arrange E-visit).  ***The importance of social distancing was discussed today.  Time:   Today, I have spent *** minutes with the patient with telehealth technology discussing the above problems.     Medication Adjustments/Labs and Tests Ordered: Current medicines are reviewed at length with the patient today.  Concerns regarding medicines are outlined above.   Tests Ordered: No orders of the defined types were placed in this encounter.   Medication Changes: No orders of the defined types were placed in this encounter.   Disposition:  Follow up {follow up:15908}  Signed, Rozann Lesches, MD  09/02/2018 9:00 AM    [facility]

## 2018-09-05 ENCOUNTER — Ambulatory Visit: Payer: Medicare Other | Admitting: Cardiology

## 2018-09-06 DIAGNOSIS — M189 Osteoarthritis of first carpometacarpal joint, unspecified: Secondary | ICD-10-CM | POA: Diagnosis not present

## 2018-09-06 DIAGNOSIS — M19049 Primary osteoarthritis, unspecified hand: Secondary | ICD-10-CM | POA: Insufficient documentation

## 2018-09-06 DIAGNOSIS — M19041 Primary osteoarthritis, right hand: Secondary | ICD-10-CM | POA: Diagnosis not present

## 2018-09-06 DIAGNOSIS — M6530 Trigger finger, unspecified finger: Secondary | ICD-10-CM | POA: Insufficient documentation

## 2018-09-06 DIAGNOSIS — M180 Bilateral primary osteoarthritis of first carpometacarpal joints: Secondary | ICD-10-CM | POA: Diagnosis not present

## 2018-10-04 DIAGNOSIS — M19041 Primary osteoarthritis, right hand: Secondary | ICD-10-CM | POA: Diagnosis not present

## 2018-10-04 DIAGNOSIS — M189 Osteoarthritis of first carpometacarpal joint, unspecified: Secondary | ICD-10-CM | POA: Diagnosis not present

## 2018-10-18 DIAGNOSIS — R3912 Poor urinary stream: Secondary | ICD-10-CM | POA: Diagnosis not present

## 2018-10-18 DIAGNOSIS — N281 Cyst of kidney, acquired: Secondary | ICD-10-CM | POA: Diagnosis not present

## 2018-10-20 ENCOUNTER — Other Ambulatory Visit (HOSPITAL_COMMUNITY): Payer: Self-pay | Admitting: Pulmonary Disease

## 2018-10-20 ENCOUNTER — Other Ambulatory Visit: Payer: Self-pay | Admitting: Pulmonary Disease

## 2018-10-20 DIAGNOSIS — R109 Unspecified abdominal pain: Secondary | ICD-10-CM | POA: Diagnosis not present

## 2018-10-20 DIAGNOSIS — I480 Paroxysmal atrial fibrillation: Secondary | ICD-10-CM | POA: Diagnosis not present

## 2018-10-20 DIAGNOSIS — I25119 Atherosclerotic heart disease of native coronary artery with unspecified angina pectoris: Secondary | ICD-10-CM | POA: Diagnosis not present

## 2018-10-20 DIAGNOSIS — I5030 Unspecified diastolic (congestive) heart failure: Secondary | ICD-10-CM | POA: Diagnosis not present

## 2018-10-20 DIAGNOSIS — R1012 Left upper quadrant pain: Secondary | ICD-10-CM

## 2018-10-21 DIAGNOSIS — I480 Paroxysmal atrial fibrillation: Secondary | ICD-10-CM | POA: Diagnosis not present

## 2018-10-21 DIAGNOSIS — I25119 Atherosclerotic heart disease of native coronary artery with unspecified angina pectoris: Secondary | ICD-10-CM | POA: Diagnosis not present

## 2018-10-21 DIAGNOSIS — Z125 Encounter for screening for malignant neoplasm of prostate: Secondary | ICD-10-CM | POA: Diagnosis not present

## 2018-10-21 DIAGNOSIS — R739 Hyperglycemia, unspecified: Secondary | ICD-10-CM | POA: Diagnosis not present

## 2018-10-21 DIAGNOSIS — R109 Unspecified abdominal pain: Secondary | ICD-10-CM | POA: Diagnosis not present

## 2018-10-21 DIAGNOSIS — I5030 Unspecified diastolic (congestive) heart failure: Secondary | ICD-10-CM | POA: Diagnosis not present

## 2018-10-21 LAB — CBC AND DIFFERENTIAL
HCT: 45 (ref 41–53)
Hemoglobin: 14.8 (ref 13.5–17.5)
Neutrophils Absolute: 3
Platelets: 271 (ref 150–399)
WBC: 4.9

## 2018-10-21 LAB — PSA: PSA: 5.6

## 2018-10-21 LAB — TSH: TSH: 1.65 (ref <=5.90)

## 2018-10-21 LAB — LIPID PANEL
Cholesterol: 207 — AB (ref 0–200)
HDL: 41 (ref 35–70)
LDL Cholesterol: 137
Triglycerides: 145 (ref 40–160)

## 2018-10-21 LAB — COMPREHENSIVE METABOLIC PANEL
Albumin: 4.3 (ref 3.5–5.0)
Calcium: 9.2 (ref 8.7–10.7)
GFR calc Af Amer: 77
GFR calc non Af Amer: 66

## 2018-10-21 LAB — BASIC METABOLIC PANEL
BUN: 13 (ref 4–21)
CO2: 22 (ref 13–22)
Chloride: 104 (ref 99–108)
Creatinine: 1.2 (ref <=1.3)
Glucose: 110
Potassium: 4.4 (ref 3.4–5.3)
Sodium: 141 (ref 137–147)

## 2018-10-21 LAB — HEMOGLOBIN A1C: Hemoglobin A1C: 5.7

## 2018-10-21 LAB — CBC: RBC: 4.92 (ref 3.87–5.11)

## 2018-10-27 ENCOUNTER — Other Ambulatory Visit: Payer: Self-pay | Admitting: Pulmonary Disease

## 2018-10-27 ENCOUNTER — Other Ambulatory Visit (HOSPITAL_BASED_OUTPATIENT_CLINIC_OR_DEPARTMENT_OTHER): Payer: Self-pay

## 2018-10-27 ENCOUNTER — Other Ambulatory Visit (HOSPITAL_COMMUNITY): Payer: Self-pay | Admitting: Pulmonary Disease

## 2018-10-27 DIAGNOSIS — G4730 Sleep apnea, unspecified: Secondary | ICD-10-CM

## 2018-10-27 DIAGNOSIS — G4710 Hypersomnia, unspecified: Secondary | ICD-10-CM

## 2018-10-27 DIAGNOSIS — R1012 Left upper quadrant pain: Secondary | ICD-10-CM

## 2018-10-27 DIAGNOSIS — R5383 Other fatigue: Secondary | ICD-10-CM

## 2018-10-27 DIAGNOSIS — R0683 Snoring: Secondary | ICD-10-CM

## 2018-10-28 ENCOUNTER — Ambulatory Visit (HOSPITAL_COMMUNITY)
Admission: RE | Admit: 2018-10-28 | Discharge: 2018-10-28 | Disposition: A | Discharge status: Home / Self Care | Payer: Medicare Other | Source: Ambulatory Visit | Attending: Pulmonary Disease | Admitting: Pulmonary Disease

## 2018-10-28 ENCOUNTER — Other Ambulatory Visit: Payer: Self-pay

## 2018-10-28 DIAGNOSIS — R1012 Left upper quadrant pain: Secondary | ICD-10-CM | POA: Diagnosis not present

## 2018-10-28 DIAGNOSIS — N400 Enlarged prostate without lower urinary tract symptoms: Secondary | ICD-10-CM | POA: Diagnosis not present

## 2018-10-28 DIAGNOSIS — N281 Cyst of kidney, acquired: Secondary | ICD-10-CM | POA: Diagnosis not present

## 2018-10-28 LAB — POCT I-STAT CREATININE: Creatinine, Ser: 1.3 mg/dL — ABNORMAL HIGH (ref 0.61–1.24)

## 2018-10-28 MED ORDER — IOHEXOL 300 MG/ML  SOLN
100.0000 mL | Freq: Once | INTRAMUSCULAR | Status: AC | PRN
  Administered 2018-10-28: 100 mL via INTRAVENOUS

## 2018-11-04 ENCOUNTER — Other Ambulatory Visit (HOSPITAL_COMMUNITY): Payer: Medicare Other

## 2018-11-08 ENCOUNTER — Ambulatory Visit: Payer: Medicare Other

## 2018-12-02 DIAGNOSIS — I25119 Atherosclerotic heart disease of native coronary artery with unspecified angina pectoris: Secondary | ICD-10-CM | POA: Diagnosis not present

## 2018-12-02 DIAGNOSIS — R109 Unspecified abdominal pain: Secondary | ICD-10-CM | POA: Diagnosis not present

## 2018-12-02 DIAGNOSIS — I10 Essential (primary) hypertension: Secondary | ICD-10-CM | POA: Diagnosis not present

## 2018-12-02 DIAGNOSIS — E669 Obesity, unspecified: Secondary | ICD-10-CM | POA: Diagnosis not present

## 2018-12-08 DIAGNOSIS — H2000 Unspecified acute and subacute iridocyclitis: Secondary | ICD-10-CM | POA: Diagnosis not present

## 2018-12-15 ENCOUNTER — Other Ambulatory Visit: Payer: Self-pay | Admitting: Urology

## 2018-12-16 ENCOUNTER — Other Ambulatory Visit: Payer: Self-pay | Admitting: Urology

## 2018-12-22 DIAGNOSIS — H2000 Unspecified acute and subacute iridocyclitis: Secondary | ICD-10-CM | POA: Diagnosis not present

## 2018-12-26 ENCOUNTER — Encounter: Payer: Self-pay | Admitting: Cardiology

## 2018-12-26 ENCOUNTER — Other Ambulatory Visit: Payer: Self-pay

## 2018-12-26 ENCOUNTER — Ambulatory Visit (INDEPENDENT_AMBULATORY_CARE_PROVIDER_SITE_OTHER): Payer: Medicare Other | Admitting: Cardiology

## 2018-12-26 VITALS — BP 119/69 | HR 53 | Temp 97.5°F | Ht 75.0 in | Wt 290.0 lb

## 2018-12-26 DIAGNOSIS — I10 Essential (primary) hypertension: Secondary | ICD-10-CM | POA: Diagnosis not present

## 2018-12-26 DIAGNOSIS — I480 Paroxysmal atrial fibrillation: Secondary | ICD-10-CM | POA: Diagnosis not present

## 2018-12-26 DIAGNOSIS — I2510 Atherosclerotic heart disease of native coronary artery without angina pectoris: Secondary | ICD-10-CM | POA: Diagnosis not present

## 2018-12-26 NOTE — Progress Notes (Signed)
Cardiology Office Note  Date: 12/26/2018   ID: Jonathan Dyer, Jonathan Dyer June 25, 1955, MRN [id number]  PCP:  Jonathan Du, MD  Cardiologist:  Jonathan Lesches, MD Electrophysiologist:  None   Chief Complaint  Patient presents with  . Cardiac follow-up    History of Present Illness: Jonathan Dyer is a 63 y.o. male last seen in October 2019.  He presents for a routine visit.  He tells me that he has been focused on diet and weight loss since having lab work with Dr. Luan Dyer.  He has cut out soft drinks and sweets, also reduced portion size.  He is starting to see some results would like to get down into the 220s if possible.  He has been riding his bicycle regularly as well.  He has gone on rides ranging from 5 miles to 26 miles.  He did have to drop out of a 25 mile gravel road race in early August due to what sounds like heat exhaustion.  I reviewed his medications which are listed below.  He does not report any progressive sense of palpitations or exertional chest pain.  He did have a GXT back in 2016, we discussed obtaining a follow-up study with him on flecainide and a prior history of nonobstructive atherosclerosis.  CHADSVASC score is 3.  He has declined anticoagulation over time and this remains the case today.  I continue to address this with him.  Past Medical History:  Diagnosis Date  . Atrial fibrillation ([place])    Diagnosed 09/2009, on flecainide  . Bradycardia    Event monitor 2010: HR down to 45, asymptomatic.  Jonathan Dyer Coronary atherosclerosis of native coronary artery    Nonobstructive and distal disease by cath 2005.   Jonathan Dyer Essential hypertension, benign   . Hyperlipidemia   . OSA (obstructive sleep apnea)    no device   . Renal cyst   . Ringing in ears   . Sleep apnea     Past Surgical History:  Procedure Laterality Date  . BIOPSY  01/04/2018   Procedure: BIOPSY;  Surgeon: Jonathan Binder, MD;  [facility];  Service: Endoscopy;;  ascending colon   .  CARDIAC CATHETERIZATION  2005  . COLONOSCOPY  05/2006   SLF: 3 mm sigmoid: Tubular adenoma removed.  . COLONOSCOPY N/A 05/06/2016   Dr. Oneida Dyer: two sessile polyps in mid transverse colon and hepatic flexure (tubular adenomas). ext/int hemorrhoids. tortuous left colon.   . COLONOSCOPY WITH PROPOFOL N/A 01/04/2018   Procedure: COLONOSCOPY WITH PROPOFOL;  Surgeon: Jonathan Binder, MD;  [facility];  Service: Endoscopy;  Laterality: N/A;  2:30pm  . KNEE ARTHROSCOPY    . Left anterior cruciate ligament reconstruction    . left shoulder surgery     dr . Jonathan Dyer   . POLYPECTOMY  05/06/2016   Procedure: POLYPECTOMY;  Surgeon: Jonathan Binder, MD;  [facility];  Service: Endoscopy;;  transverse colon polyp, hepatic flexure polyp,   . Right shoulder surgery    . ROBOT ASSISTED LAPAROSCOPIC NEPHRECTOMY Left 07/04/2018   Procedure: XI ROBOTIC ASSISTED LAPAROSCOPIC RENAL CYST DECORTICATION;  Surgeon: Jonathan Gustin, MD;  [facility];  Service: Urology;  Laterality: Left;  3 HRS PROCEDURE: ROBOTIC RENAL CYST DECORTICATION  . TONSILLECTOMY    . TOTAL KNEE ARTHROPLASTY      Current Outpatient Medications  Medication Sig Dispense Refill  . amLODipine (NORVASC) 10 MG tablet Take 10 mg by mouth daily.     Jonathan Dyer  flecainide (TAMBOCOR) 50 MG tablet TAKE 1 TABLET BY MOUTH  TWICE A DAY (Patient taking differently: Take 50 mg by mouth 2 (two) times daily. ) 180 tablet 3  . furosemide (LASIX) 40 MG tablet Take 40 mg by mouth daily as needed for fluid.     Jonathan Dyer losartan (COZAAR) 100 MG tablet Take 100 mg by mouth daily.     Jonathan Dyer omeprazole (PRILOSEC) 20 MG capsule TAKE 1 CAPSULE BY MOUTH  DAILY 90 capsule 3  . pravastatin (PRAVACHOL) 40 MG tablet Take 40 mg by mouth at bedtime.     . tamsulosin (FLOMAX) 0.4 MG CAPS capsule Take 0.4 mg by mouth every other day.      No current facility-administered medications for this visit.    Allergies:  Clindamycin/lincomycin and Penicillins    Social History: The patient  reports that he has never smoked. He has never used smokeless tobacco. He reports that he does not drink alcohol or use drugs.   ROS:  Please see the history of present illness. Otherwise, complete review of systems is positive for none.  All other systems are reviewed and negative.   Physical Exam: VS:  BP 119/69   Pulse (!) 53   Temp (!) 97.5 F (36.4 C)   Ht 6\' 3"  (1.905 m)   Wt 290 lb (131.5 kg)   BMI 36.25 kg/m , BMI Body mass index is 36.25 kg/m.  Wt Readings from Last 3 Encounters:  12/26/18 290 lb (131.5 kg)  07/04/18 292 lb 15.9 oz (132.9 kg)  06/28/18 291 lb (132 kg)    General: Patient appears comfortable at rest. HEENT: Conjunctiva and lids normal, wearing a mask. Neck: Supple, no elevated JVP or carotid bruits, no thyromegaly. Lungs: Clear to auscultation, nonlabored breathing at rest. Cardiac: Regular rate and rhythm, no S3 or significant systolic murmur. Abdomen: Soft, nontender, bowel sounds present. Extremities: No pitting edema, distal pulses 2+. Skin: Warm and dry. Musculoskeletal: No kyphosis. Neuropsychiatric: Alert and oriented x3, affect grossly appropriate.  ECG:  An ECG dated 06/28/2018 was personally reviewed today and demonstrated:  Sinus bradycardia with QRS 116 ms.  Recent Labwork: 06/28/2018: Platelets 272 07/05/2018: BUN 11; Hemoglobin 12.9; Potassium 3.9; Sodium 137 10/28/2018: Creatinine, Ser 1.30   Other Studies Reviewed Today:  Echocardiogram 11/18/2016: Study Conclusions  - Left ventricle: The cavity size was normal. Wall thickness was increased in a pattern of mild LVH. Systolic function was normal. The estimated ejection fraction was in the range of 55% to 60%. Wall motion was normal; there were no regional wall motion abnormalities. Left ventricular diastolic function parameters were normal. - Aortic valve: Mildly calcified annulus. Trileaflet; mildly thickened leaflets. Valve area (VTI):  3.42 cm^2. Valve area (Vmax): 3.19 cm^2. Valve area (Vmean): 2.93 cm^2. - Mitral valve: Mildly calcified annulus. Mildly thickened leaflets. - Technically adequate study.  Assessment and Plan:  1.  Paroxysmal atrial fibrillation.  CHADSVASC score is 3.  He continues to decline anticoagulation.  Otherwise, he remains stable without active palpitations on flecainide.  We will obtain a follow-up GXT for ischemic surveillance, his last study was in 2016.  2.  History of mild coronary atherosclerosis, asymptomatic.  Follow-up GXT being obtained for ischemic surveillance.  3.  Essential hypertension, blood pressure is well controlled today.  He remains on Norvasc and Cozaar.  With further weight loss his medications may be able to be simplified.  4.  Bilateral renal cysts.  He continues to follow with Urology.  He anticipates robotic surgery eventually.  Medication Adjustments/Labs and Tests Ordered: Current medicines are reviewed at length with the patient today.  Concerns regarding medicines are outlined above.   Tests Ordered: GXT  Medication Changes: No orders of the defined types were placed in this encounter.   Disposition:  Follow up 6 months in the [place] office.  Signed, Satira Sark, MD, [facility] 12/26/2018 1:19 PM    [place] [facility] [address], [place], [place] [address] Phone: [phone]; Fax: [phone]

## 2018-12-26 NOTE — Patient Instructions (Signed)
Medication Instructions: Your physician recommends that you continue on your current medications as directed. Please refer to the Current Medication list given to you today.  Labwork: none  Procedures/Testing: Your physician has requested that you have an exercise tolerance test. For further information please visit [email or web address]. Please also follow instruction sheet, as given. CALL ME WHEN YOU ARE READY TO SCHEDULE    Follow-Up: 6  Months with Dr.McDowell  Any Additional Special Instructions Will Be Listed Below (If Applicable).     If you need a refill on your cardiac medications before your next appointment, please call your pharmacy.       Thank you for choosing [facility] !

## 2018-12-28 ENCOUNTER — Telehealth: Payer: Self-pay | Admitting: Cardiology

## 2018-12-28 NOTE — Telephone Encounter (Signed)
Left message concerning COVID testing

## 2018-12-29 NOTE — Telephone Encounter (Signed)
Pt called office to state that he was ready to have GXT done. COVID screening appt placed and pt notified.

## 2018-12-29 NOTE — Addendum Note (Signed)
Addended by: Levonne Hubert on: 12/29/2018 10:18 AM   Modules accepted: Orders

## 2018-12-30 ENCOUNTER — Other Ambulatory Visit (HOSPITAL_COMMUNITY)
Admission: RE | Admit: 2018-12-30 | Discharge: 2018-12-30 | Disposition: A | Discharge status: Home / Self Care | Payer: Medicare Other | Source: Ambulatory Visit | Attending: Cardiology | Admitting: Cardiology

## 2018-12-30 ENCOUNTER — Other Ambulatory Visit: Payer: Self-pay

## 2018-12-30 DIAGNOSIS — Z20828 Contact with and (suspected) exposure to other viral communicable diseases: Secondary | ICD-10-CM | POA: Diagnosis not present

## 2018-12-30 DIAGNOSIS — Z01812 Encounter for preprocedural laboratory examination: Secondary | ICD-10-CM | POA: Diagnosis not present

## 2018-12-30 LAB — NOVEL CORONAVIRUS, NAA: SARS-CoV-2, NAA: NOT DETECTED

## 2018-12-30 LAB — SARS CORONAVIRUS 2 (TAT 6-24 HRS): SARS Coronavirus 2: NEGATIVE

## 2019-01-02 ENCOUNTER — Telehealth: Payer: Self-pay | Admitting: Gastroenterology

## 2019-01-02 NOTE — Telephone Encounter (Signed)
Recall for mri °

## 2019-01-02 NOTE — Telephone Encounter (Signed)
Recall sent 

## 2019-01-03 ENCOUNTER — Ambulatory Visit (HOSPITAL_COMMUNITY)
Admission: RE | Admit: 2019-01-03 | Discharge: 2019-01-03 | Disposition: A | Discharge status: Home / Self Care | Payer: Medicare Other | Source: Ambulatory Visit | Attending: Pulmonary Disease | Admitting: Pulmonary Disease

## 2019-01-03 ENCOUNTER — Other Ambulatory Visit: Payer: Self-pay

## 2019-01-03 DIAGNOSIS — I2510 Atherosclerotic heart disease of native coronary artery without angina pectoris: Secondary | ICD-10-CM | POA: Diagnosis not present

## 2019-01-03 LAB — EXERCISE TOLERANCE TEST
Estimated workload: 10.1 METS
Exercise duration (min): 7 min
Exercise duration (sec): 26 s
MPHR: 155 {beats}/min
Peak HR: 134 {beats}/min
Percent HR: 86 %
RPE: 15
Rest HR: 63 {beats}/min

## 2019-01-06 DIAGNOSIS — D0462 Carcinoma in situ of skin of left upper limb, including shoulder: Secondary | ICD-10-CM | POA: Diagnosis not present

## 2019-01-06 DIAGNOSIS — D485 Neoplasm of uncertain behavior of skin: Secondary | ICD-10-CM | POA: Diagnosis not present

## 2019-01-06 DIAGNOSIS — Z85828 Personal history of other malignant neoplasm of skin: Secondary | ICD-10-CM | POA: Diagnosis not present

## 2019-01-06 DIAGNOSIS — D225 Melanocytic nevi of trunk: Secondary | ICD-10-CM | POA: Diagnosis not present

## 2019-01-06 DIAGNOSIS — L565 Disseminated superficial actinic porokeratosis (DSAP): Secondary | ICD-10-CM | POA: Diagnosis not present

## 2019-01-06 DIAGNOSIS — L570 Actinic keratosis: Secondary | ICD-10-CM | POA: Diagnosis not present

## 2019-01-06 DIAGNOSIS — H2000 Unspecified acute and subacute iridocyclitis: Secondary | ICD-10-CM | POA: Diagnosis not present

## 2019-01-06 DIAGNOSIS — H5203 Hypermetropia, bilateral: Secondary | ICD-10-CM | POA: Diagnosis not present

## 2019-01-06 DIAGNOSIS — L821 Other seborrheic keratosis: Secondary | ICD-10-CM | POA: Diagnosis not present

## 2019-01-06 DIAGNOSIS — D1801 Hemangioma of skin and subcutaneous tissue: Secondary | ICD-10-CM | POA: Diagnosis not present

## 2019-01-06 DIAGNOSIS — H524 Presbyopia: Secondary | ICD-10-CM | POA: Diagnosis not present

## 2019-01-10 ENCOUNTER — Telehealth: Payer: Self-pay

## 2019-01-10 ENCOUNTER — Other Ambulatory Visit: Payer: Self-pay

## 2019-01-10 DIAGNOSIS — K862 Cyst of pancreas: Secondary | ICD-10-CM

## 2019-01-10 NOTE — Telephone Encounter (Signed)
Pt called office and LMOVM to schedule MRI recall.  MRI abd w/wo contrast scheduled for 02/21/19 at 10:30am, arrive at 10:00am. NPO 4 hours prior to test.   Called and informed pt of MRI appt. Letter mailed.

## 2019-02-08 DIAGNOSIS — M1711 Unilateral primary osteoarthritis, right knee: Secondary | ICD-10-CM | POA: Diagnosis not present

## 2019-02-21 ENCOUNTER — Ambulatory Visit (HOSPITAL_COMMUNITY)
Admission: RE | Admit: 2019-02-21 | Discharge: 2019-02-21 | Disposition: A | Discharge status: Home / Self Care | Payer: Medicare Other | Source: Ambulatory Visit | Attending: Gastroenterology | Admitting: Gastroenterology

## 2019-02-21 ENCOUNTER — Other Ambulatory Visit: Payer: Self-pay | Admitting: Gastroenterology

## 2019-02-21 ENCOUNTER — Other Ambulatory Visit: Payer: Self-pay

## 2019-02-21 DIAGNOSIS — K862 Cyst of pancreas: Secondary | ICD-10-CM | POA: Diagnosis not present

## 2019-02-21 LAB — POCT I-STAT CREATININE: Creatinine, Ser: 1.4 mg/dL — ABNORMAL HIGH (ref 0.61–1.24)

## 2019-02-21 MED ORDER — GADOBUTROL 1 MMOL/ML IV SOLN
10.0000 mL | Freq: Once | INTRAVENOUS | Status: AC | PRN
  Administered 2019-02-21: 11:00:00 10 mL via INTRAVENOUS

## 2019-02-24 NOTE — Progress Notes (Signed)
ON RECALL  °

## 2019-03-03 DIAGNOSIS — I25119 Atherosclerotic heart disease of native coronary artery with unspecified angina pectoris: Secondary | ICD-10-CM | POA: Diagnosis not present

## 2019-03-03 DIAGNOSIS — I10 Essential (primary) hypertension: Secondary | ICD-10-CM | POA: Diagnosis not present

## 2019-03-03 DIAGNOSIS — Z23 Encounter for immunization: Secondary | ICD-10-CM | POA: Diagnosis not present

## 2019-03-03 DIAGNOSIS — I480 Paroxysmal atrial fibrillation: Secondary | ICD-10-CM | POA: Diagnosis not present

## 2019-03-03 DIAGNOSIS — I5030 Unspecified diastolic (congestive) heart failure: Secondary | ICD-10-CM | POA: Diagnosis not present

## 2019-03-09 ENCOUNTER — Inpatient Hospital Stay: Admit: 2019-03-09 | Payer: Medicare Other | Admitting: Urology

## 2019-03-09 SURGERY — NEPHRECTOMY, PARTIAL, ROBOT-ASSISTED
Anesthesia: General | Laterality: Right

## 2019-03-24 DIAGNOSIS — R972 Elevated prostate specific antigen [PSA]: Secondary | ICD-10-CM | POA: Diagnosis not present

## 2019-03-24 DIAGNOSIS — N281 Cyst of kidney, acquired: Secondary | ICD-10-CM | POA: Diagnosis not present

## 2019-03-24 DIAGNOSIS — R3912 Poor urinary stream: Secondary | ICD-10-CM | POA: Diagnosis not present

## 2019-04-17 ENCOUNTER — Ambulatory Visit (INDEPENDENT_AMBULATORY_CARE_PROVIDER_SITE_OTHER): Payer: Medicare Other | Admitting: Family Medicine

## 2019-04-17 ENCOUNTER — Other Ambulatory Visit: Payer: Self-pay

## 2019-04-17 ENCOUNTER — Encounter: Payer: Self-pay | Admitting: Family Medicine

## 2019-04-17 VITALS — BP 125/80 | HR 56 | Temp 98.1°F | Ht 74.0 in | Wt 287.2 lb

## 2019-04-17 DIAGNOSIS — K862 Cyst of pancreas: Secondary | ICD-10-CM | POA: Diagnosis not present

## 2019-04-17 DIAGNOSIS — I10 Essential (primary) hypertension: Secondary | ICD-10-CM | POA: Diagnosis not present

## 2019-04-17 DIAGNOSIS — E782 Mixed hyperlipidemia: Secondary | ICD-10-CM

## 2019-04-17 DIAGNOSIS — K219 Gastro-esophageal reflux disease without esophagitis: Secondary | ICD-10-CM | POA: Diagnosis not present

## 2019-04-17 DIAGNOSIS — N281 Cyst of kidney, acquired: Secondary | ICD-10-CM | POA: Diagnosis not present

## 2019-04-17 MED ORDER — BACLOFEN 10 MG PO TABS: ORAL_TABLET | ORAL | 5 refills | Status: DC

## 2019-04-17 NOTE — Progress Notes (Signed)
New Patient Office Visit  Subjective:  [name], male    DOB: 03-01-1956  Age: 63 y.o. MRN: [id number]  CC:  Chief Complaint  Patient presents with  . Establish Care  . Back Pain    pulled muscle    HPI [name] presents for kidney-surgery to remove kidney cysts in the past Pt using lasix as needed-last dose greater 3 months ago Cr elevated 1.4 previously afib-stable-eval 8/20 cardiology Left paraspinal muscle-tenderness to palpation and movement-takes ibuprofen occasionally Past Medical History:  Diagnosis Date  . Atrial fibrillation ([place])    Diagnosed 09/2009, on flecainide  . Bradycardia    Event monitor 2010: HR down to 45, asymptomatic.  [name] Coronary atherosclerosis of native coronary artery    Nonobstructive and distal disease by cath 2005.   [name] Essential hypertension, benign   . Hyperlipidemia   . OSA (obstructive sleep apnea)    no device   . Renal cyst   . Ringing in ears   . Sleep apnea     Past Surgical History:  Procedure Laterality Date  . BIOPSY  01/04/2018   Procedure: BIOPSY;  Surgeon: [name], MD;  [facility];  Service: Endoscopy;;  ascending colon   . CARDIAC CATHETERIZATION  2005  . COLONOSCOPY  05/2006   SLF: 3 mm sigmoid: Tubular adenoma removed.  . COLONOSCOPY N/A 05/06/2016   Dr. [name]: two sessile polyps in mid transverse colon and hepatic flexure (tubular adenomas). ext/int hemorrhoids. tortuous left colon.   . COLONOSCOPY WITH PROPOFOL N/A 01/04/2018   Procedure: COLONOSCOPY WITH PROPOFOL;  Surgeon: [name], MD;  [facility];  Service: Endoscopy;  Laterality: N/A;  2:30pm  . KNEE ARTHROSCOPY    . Left anterior cruciate ligament reconstruction    . left shoulder surgery     dr . [name]   . POLYPECTOMY  05/06/2016   Procedure: POLYPECTOMY;  Surgeon: [name], MD;  [facility];  Service: Endoscopy;;  transverse colon polyp, hepatic flexure polyp,    . Right shoulder surgery    . ROBOT ASSISTED LAPAROSCOPIC NEPHRECTOMY Left 07/04/2018   Procedure: XI ROBOTIC ASSISTED LAPAROSCOPIC RENAL CYST DECORTICATION;  Surgeon: [name], MD;  [facility];  Service: Urology;  Laterality: Left;  3 HRS PROCEDURE: ROBOTIC RENAL CYST DECORTICATION  . TONSILLECTOMY    . TOTAL KNEE ARTHROPLASTY      Family History  Problem Relation Age of Onset  . Pulmonary embolism Mother   . Lung disease Father   . Hyperlipidemia Brother   . Hypertension Brother   . Diabetes Other        Significant family h/o DM  . Colon cancer Neg Hx     Social History   Socioeconomic History  . Marital status: Divorced    Spouse name: Not on file  . Number of children: 3  . Years of education: Not on file  . Highest education level: Not on file  Occupational History  . Occupation: PT-custodial work  Scientific laboratory technician  . Financial resource strain: Not on file  . Food insecurity    Worry: Not on file    Inability: Not on file  . Transportation needs    Medical: Not on file    Non-medical: Not on file  Tobacco Use  . Smoking status: Never Smoker  . Smokeless tobacco: Never Used  Substance and Sexual Activity  . Alcohol use: No    Alcohol/week:  0.0 standard drinks  . Drug use: No  . Sexual activity: Not Currently  Lifestyle  . Physical activity    Days per week: Not on file    Minutes per session: Not on file  . Stress: Not on file  Relationships  . Social Herbalist on phone: Not on file    Gets together: Not on file    Attends religious service: Not on file    Active member of club or organization: Not on file    Attends meetings of clubs or organizations: Not on file    Relationship status: Not on file  . Intimate partner violence    Fear of current or ex partner: Not on file    Emotionally abused: Not on file    Physically abused: Not on file    Forced sexual activity: Not on file  Other Topics Concern  . Not on file  Social  History Narrative  . Not on file   ROS Review of Systems  Constitutional: Negative.   HENT: Positive for tinnitus.   Eyes:       Glasses up to date  Respiratory: Negative.   Cardiovascular: Positive for leg swelling.       No recent leg swelling No cp  Endocrine:       A1c 5.7%  Genitourinary:       Urologist-renal cysts  Musculoskeletal: Positive for arthralgias, back pain and myalgias.  Skin:       Derm evaluation-[facility] yearly-pre-CA   Allergic/Immunologic: Negative.   Neurological: Negative.   Hematological: Negative.   Psychiatric/Behavioral: Negative.    FINDINGS:02/2019 Lower chest: Unremarkable  Hepatobiliary: Mild diffuse hepatic steatosis.  In the right hepatic lobe near the base of the caudate lobe, a 0.9 by 0.8 cm focus of delayed enhancement on image 33/24 is probably a small hemangioma. Gallbladder unremarkable. No biliary dilatation. No filling defect is observed in the biliary tree. No biliary irregularity or abnormal enhancement along the biliary tree.  Pancreas: The larger cystic lesion at the junction of the body and head of the pancreas is mildly obscured by motion artifact but is believed to measure 1.1 by 0.7 cm on image 32/5. No observed enhancement. This lesion is appreciable on image 101 of series 12 where it measures 1.0 cm in long axis. A smaller 0.2 cm cystic lesion in the body of the pancreas is shown on image 108/12. No abnormal pancreatic enhancement is identified.  Spleen:  Unremarkable  Adrenals/Urinary Tract: Multiple bilateral renal cysts of varying complexity, no appreciable enhancement on subtraction imaging, compatible with Bosniak category 1 and category 2 cysts. The adrenal glands appear unremarkable.  Stomach/Bowel: Unremarkable  Vascular/Lymphatic:  Aortoiliac atherosclerotic vascular disease.  Other:  No supplemental non-categorized findings.  Musculoskeletal: Unremarkable  IMPRESSION: 1.  Essentially stable small cystic lesion at the junction of the body and head of the pancreas currently measuring 1.1 by 0.7 cm, previously measured 1.3 by 1.0 cm. Today's measurements are adversely affected by motion artifact. Possibilities include postinflammatory cystic lesion or a small intraductal papillary mucinous neoplasm. Based on patient age and size of this lesion, follow up pancreatic protocol MRI is recommended in 1 years time. This recommendation follows ACR consensus guidelines: Management of Incidental Pancreatic Cysts: A White Paper of the ACR Incidental Findings Committee. J Am Coll Radiol [id number]. 2. Potential tiny hemangioma medially in the right hepatic lobe on image 33/24. 3. Multiple bilateral Bosniak category 1 and category 2 renal cysts. 4.  Aortic  Atherosclerosis (ICD10-I70.0). 5. Mild diffuse hepatic steatosis.   IMPRESSION: Minor annular rent at L4-5. Mild posterior element hypertrophy at this level. No stenosis or neural impingement.  Otherwise unremarkable lumbar MRI.  BILATERAL renal cystic disease appears increased from 2011. Continued surveillance with sonography may be warranted.  Objective:   Today's Vitals: BP 125/80 (BP Location: Left Arm, Patient Position: Sitting, Cuff Size: Normal)   Pulse (!) 56   Temp 98.1 F (36.7 C) (Oral)   Ht 6\' 2"  (1.88 m)   Wt 287 lb 3.2 oz (130.3 kg)   SpO2 96%   BMI 36.87 kg/m   Physical Exam Constitutional:      Appearance: Normal appearance.  HENT:     Head: Normocephalic and atraumatic.  Neck:     Musculoskeletal: Normal range of motion and neck supple.  Cardiovascular:     Rate and Rhythm: Normal rate. Rhythm irregular.     Pulses: Normal pulses.     Heart sounds: Normal heart sounds.  Pulmonary:     Effort: Pulmonary effort is normal.     Breath sounds: Normal breath sounds.  Musculoskeletal:     Comments: Left paraspinal muscle tenderness  Neurological:     Mental Status: He is  alert and oriented to person, place, and time.  Psychiatric:        Mood and Affect: Mood normal.        Behavior: Behavior normal.     Assessment & Plan:  1. HYPERLIPIDEMIA, MIXED pravachol-rx lipid panel 2. Pancreas cyst MRI yearly  3. Essential hypertension, benign Stable-taking meds-cardio following Losartan Lasix-prn Norvasc flecainde 4. Gastroesophageal reflux disease without esophagitis prilosec Outpatient Encounter Medications as of 04/17/2019  Medication Sig  . amLODipine (NORVASC) 10 MG tablet Take 10 mg by mouth daily.   . flecainide (TAMBOCOR) 50 MG tablet TAKE 1 TABLET BY MOUTH  TWICE A DAY (Patient taking differently: Take 50 mg by mouth 2 (two) times daily. )  . furosemide (LASIX) 40 MG tablet Take 40 mg by mouth daily as needed for fluid.   [name] losartan (COZAAR) 100 MG tablet Take 100 mg by mouth daily.   [name] omeprazole (PRILOSEC) 20 MG capsule TAKE 1 CAPSULE BY MOUTH  DAILY  . pravastatin (PRAVACHOL) 40 MG tablet Take 40 mg by mouth at bedtime.   . tamsulosin (FLOMAX) 0.4 MG CAPS capsule Take 0.4 mg by mouth every other day.    No facility-administered encounter medications on file as of 04/17/2019.   Follow-up: 6 months  Krithi Bray Hannah Beat, MD

## 2019-04-17 NOTE — Patient Instructions (Addendum)
Blood work non fasting Voltaren gel

## 2019-04-19 ENCOUNTER — Telehealth: Payer: Self-pay

## 2019-04-19 ENCOUNTER — Telehealth: Payer: Self-pay | Admitting: Family Medicine

## 2019-04-19 DIAGNOSIS — I10 Essential (primary) hypertension: Secondary | ICD-10-CM

## 2019-04-19 DIAGNOSIS — E782 Mixed hyperlipidemia: Secondary | ICD-10-CM

## 2019-04-19 NOTE — Telephone Encounter (Signed)
Pt lab order needs to be switched to [facility] instead of [facility].

## 2019-04-19 NOTE — Telephone Encounter (Signed)
Jonathan Dyer, CMA  

## 2019-04-19 NOTE — Telephone Encounter (Signed)
Done

## 2019-04-21 DIAGNOSIS — E782 Mixed hyperlipidemia: Secondary | ICD-10-CM | POA: Diagnosis not present

## 2019-04-21 DIAGNOSIS — I10 Essential (primary) hypertension: Secondary | ICD-10-CM | POA: Diagnosis not present

## 2019-04-22 LAB — COMPREHENSIVE METABOLIC PANEL
ALT: 15 IU/L (ref 0–44)
AST: 15 IU/L (ref 0–40)
Albumin/Globulin Ratio: 1.6 (ref 1.2–2.2)
Albumin: 4.3 g/dL (ref 3.8–4.8)
Alkaline Phosphatase: 108 IU/L (ref 39–117)
BUN/Creatinine Ratio: 11 (ref 10–24)
BUN: 15 mg/dL (ref 8–27)
Bilirubin Total: 0.5 mg/dL (ref 0.0–1.2)
CO2: 25 mmol/L (ref 20–29)
Calcium: 9.3 mg/dL (ref 8.6–10.2)
Chloride: 104 mmol/L (ref 96–106)
Creatinine, Ser: 1.39 mg/dL — ABNORMAL HIGH (ref 0.76–1.27)
GFR calc Af Amer: 61 mL/min/{1.73_m2} (ref >59)
GFR calc non Af Amer: 53 mL/min/{1.73_m2} — ABNORMAL LOW (ref >59)
Globulin, Total: 2.7 g/dL (ref 1.5–4.5)
Glucose: 97 mg/dL (ref 65–99)
Potassium: 4.3 mmol/L (ref 3.5–5.2)
Sodium: 141 mmol/L (ref 134–144)
Total Protein: 7 g/dL (ref 6.0–8.5)

## 2019-07-05 DIAGNOSIS — R52 Pain, unspecified: Secondary | ICD-10-CM | POA: Diagnosis not present

## 2019-07-05 DIAGNOSIS — M170 Bilateral primary osteoarthritis of knee: Secondary | ICD-10-CM | POA: Diagnosis not present

## 2019-07-05 DIAGNOSIS — M1712 Unilateral primary osteoarthritis, left knee: Secondary | ICD-10-CM | POA: Diagnosis not present

## 2019-07-05 DIAGNOSIS — M1711 Unilateral primary osteoarthritis, right knee: Secondary | ICD-10-CM | POA: Diagnosis not present

## 2019-07-05 DIAGNOSIS — S838X1A Sprain of other specified parts of right knee, initial encounter: Secondary | ICD-10-CM | POA: Diagnosis not present

## 2019-07-17 ENCOUNTER — Telehealth: Payer: Self-pay

## 2019-07-17 DIAGNOSIS — M25561 Pain in right knee: Secondary | ICD-10-CM | POA: Diagnosis not present

## 2019-07-17 NOTE — Telephone Encounter (Signed)
Virtual Visit Pre-Appointment Phone Call  "(Name), I am calling you today to discuss your upcoming appointment. We are currently trying to limit exposure to the virus that causes COVID-19 by seeing patients at home rather than in the office."  1. "What is the BEST phone number to call the day of the visit?" - include this in appointment notes  2. "Do you have or have access to (through a family member/friend) a smartphone with video capability that we can use for your visit?" a. If yes - list this number in appt notes as "cell" (if different from BEST phone #) and list the appointment type as a VIDEO visit in appointment notes b. If no - list the appointment type as a PHONE visit in appointment notes  3. Confirm consent - "In the setting of the current Covid19 crisis, you are scheduled for a (phone or video) visit with your provider on (date) at (time).  Just as we do with many in-office visits, in order for you to participate in this visit, we must obtain consent.  If you'd like, I can send this to your mychart (if signed up) or email for you to review.  Otherwise, I can obtain your verbal consent now.  All virtual visits are billed to your insurance company just like a normal visit would be.  By agreeing to a virtual visit, we'd like you to understand that the technology does not allow for your provider to perform an examination, and thus may limit your provider's ability to fully assess your condition. If your provider identifies any concerns that need to be evaluated in person, we will make arrangements to do so.  Finally, though the technology is pretty good, we cannot assure that it will always work on either your or our end, and in the setting of a video visit, we may have to convert it to a phone-only visit.  In either situation, we cannot ensure that we have a secure connection.  Are you willing to proceed?" STAFF: Did the patient verbally acknowledge consent to telehealth visit? Document  YES/NO here:  4. Advise patient to be prepared - "Two hours prior to your appointment, go ahead and check your blood pressure, pulse, oxygen saturation, and your weight (if you have the equipment to check those) and write them all down. When your visit starts, your provider will ask you for this information. If you have an Apple Watch or Kardia device, please plan to have heart rate information ready on the day of your appointment. Please have a pen and paper handy nearby the day of the visit as well."  5. Give patient instructions for MyChart download to smartphone OR Doximity/Doxy.me as below if video visit (depending on what platform provider is using)  6. Inform patient they will receive a phone call 15 minutes prior to their appointment time (may be from unknown caller ID) so they should be prepared to answer    Jonathan Dyer has been deemed a candidate for a follow-up tele-health visit to limit community exposure during the Covid-19 pandemic. I spoke with the patient via phone to ensure availability of phone/video source, confirm preferred email & phone number, and discuss instructions and expectations.  I reminded Jonathan Dyer to be prepared with any vital sign and/or heart rhythm information that could potentially be obtained via home monitoring, at the time of his visit. I reminded Jonathan Dyer to expect a phone call prior to his  visit.  Jonathan Dyer 07/17/2019 10:54 AM   INSTRUCTIONS FOR DOWNLOADING THE MYCHART APP TO SMARTPHONE  - The patient must first make sure to have activated MyChart and know their login information - If Apple, go to App Store and type in MyChart in the search bar and download the app. If Android, ask patient to go to [employer] and type in [place] in the search bar and download the app. The app is free but as with any other app downloads, their phone may require them to verify saved payment information or  Apple/Android password.  - The patient will need to then log into the app with their MyChart username and password, and select [facility] as their healthcare provider to link the account. When it is time for your visit, go to the MyChart app, find appointments, and click Begin Video Visit. Be sure to Select Allow for your device to access the Microphone and Camera for your visit. You will then be connected, and your provider will be with you shortly.  **If they have any issues connecting, or need assistance please contact MyChart service desk [phone])**  **If using a computer, in order to ensure the best quality for their visit they will need to use either of the following Internet Browsers: [employer], or Google Chrome**  IF USING DOXIMITY or DOXY.ME - The patient will receive a link just prior to their visit by text.     FULL LENGTH CONSENT FOR TELE-HEALTH VISIT   I hereby voluntarily request, consent and authorize [facility] and its employed or contracted physicians, physician assistants, nurse practitioners or other licensed health care professionals (the Practitioner), to provide me with telemedicine health care services (the "Services") as deemed necessary by the treating Practitioner. I acknowledge and consent to receive the Services by the Practitioner via telemedicine. I understand that the telemedicine visit will involve communicating with the Practitioner through live audiovisual communication technology and the disclosure of certain medical information by electronic transmission. I acknowledge that I have been given the opportunity to request an in-person assessment or other available alternative prior to the telemedicine visit and am voluntarily participating in the telemedicine visit.  I understand that I have the right to withhold or withdraw my consent to the use of telemedicine in the course of my care at any time, without affecting my right to future care  or treatment, and that the Practitioner or I may terminate the telemedicine visit at any time. I understand that I have the right to inspect all information obtained and/or recorded in the course of the telemedicine visit and may receive copies of available information for a reasonable fee.  I understand that some of the potential risks of receiving the Services via telemedicine include:  [name] Delay or interruption in medical evaluation due to technological equipment failure or disruption; . Information transmitted may not be sufficient (e.g. poor resolution of images) to allow for appropriate medical decision making by the Practitioner; and/or  . In rare instances, security protocols could fail, causing a breach of personal health information.  Furthermore, I acknowledge that it is my responsibility to provide information about my medical history, conditions and care that is complete and accurate to the best of my ability. I acknowledge that Practitioner's advice, recommendations, and/or decision may be based on factors not within their control, such as incomplete or inaccurate data provided by me or distortions of diagnostic images or specimens that may result from electronic transmissions. I understand that  the practice of medicine is not an exact science and that Practitioner makes no warranties or guarantees regarding treatment outcomes. I acknowledge that I will receive a copy of this consent concurrently upon execution via email to the email address I last provided but may also request a printed copy by calling the office of [facility].    I understand that my insurance will be billed for this visit.   I have read or had this consent read to me. . I understand the contents of this consent, which adequately explains the benefits and risks of the Services being provided via telemedicine.  . I have been provided ample opportunity to ask questions regarding this consent and the Services and have had  my questions answered to my satisfaction. . I give my informed consent for the services to be provided through the use of telemedicine in my medical care  By participating in this telemedicine visit I agree to the above.

## 2019-07-18 ENCOUNTER — Encounter: Payer: Self-pay | Admitting: Cardiology

## 2019-07-18 ENCOUNTER — Telehealth (INDEPENDENT_AMBULATORY_CARE_PROVIDER_SITE_OTHER): Payer: Medicare Other | Admitting: Cardiology

## 2019-07-18 ENCOUNTER — Telehealth: Payer: Self-pay | Admitting: Licensed Clinical Social Worker

## 2019-07-18 DIAGNOSIS — R0789 Other chest pain: Secondary | ICD-10-CM | POA: Diagnosis not present

## 2019-07-18 DIAGNOSIS — I480 Paroxysmal atrial fibrillation: Secondary | ICD-10-CM | POA: Diagnosis not present

## 2019-07-18 NOTE — Patient Instructions (Signed)
Medication Instructions:  Your physician recommends that you continue on your current medications as directed. Please refer to the Current Medication list given to you today.  *If you need a refill on your cardiac medications before your next appointment, please call your pharmacy*   Lab Work: None  If you have labs (blood work) drawn today and your tests are completely normal, you will receive your results only by: [name] MyChart Message (if you have MyChart) OR . A paper copy in the mail If you have any lab test that is abnormal or we need to change your treatment, we will call you to review the results.   Testing/Procedures: EKG today   Follow-Up: At [facility], you and your health needs are our priority.  As part of our continuing mission to provide you with exceptional heart care, we have created designated Provider Care Teams.  These Care Teams include your primary Cardiologist (physician) and Advanced Practice Providers (APPs -  Physician Assistants and Nurse Practitioners) who all work together to provide you with the care you need, when you need it.  We recommend signing up for the patient portal called "MyChart".  Sign up information is provided on this After Visit Summary.  MyChart is used to connect with patients for Virtual Visits (Telemedicine).  Patients are able to view lab/test results, encounter notes, upcoming appointments, etc.  Non-urgent messages can be sent to your provider as well.   To learn more about what you can do with MyChart, go to [email or web address].    Your next appointment:   6 month(s)  The format for your next appointment:   In Person  Provider:   Rozann Lesches, MD   Other Instructions None      Thank you for choosing [facility] !

## 2019-07-18 NOTE — Telephone Encounter (Signed)
CSW referred to assist patient with obtaining a BP cuff. CSW contacted patient to inform cuff will be delivered to home. Patient grateful for support and assistance. CSW available as needed. Jackie Angelia Hazell, LCSW, CCSW-MCS [phone]  

## 2019-07-18 NOTE — Progress Notes (Signed)
Virtual Visit via Telephone Note   This visit type was conducted due to national recommendations for restrictions regarding the COVID-19 Pandemic (e.g. social distancing) in an effort to limit this patient's exposure and mitigate transmission in our community.  Due to his co-morbid illnesses, this patient is at least at moderate risk for complications without adequate follow up.  This format is felt to be most appropriate for this patient at this time.  The patient did not have access to video technology/had technical difficulties with video requiring transitioning to audio format only (telephone).  All issues noted in this document were discussed and addressed.  No physical exam could be performed with this format.  Please refer to the patient's chart for his  consent to telehealth for [facility].   The patient was identified using 2 identifiers.  Date:  07/18/2019   ID:  Jonathan Dyer, DOB 07/28/1955, MRN [id number]  Patient Location: Home Provider Location: Office  PCP:  Maryruth Hancock, MD  Cardiologist:  Rozann Lesches, MD Electrophysiologist:  None   Evaluation Performed:  Follow-Up Visit  Chief Complaint:  Cardiac follow-up  History of Present Illness:    Jonathan Dyer is a 64 y.o. male last seen in August 2020.  We spoke by phone today.  He tells me that he has done relatively well over the last few months.  He has not been able to lose the weight that he had hoped during the winter but is trying to work on this more.  He did purchase a spin cycle to use at home, has not been out on the road with his bicycle regularly.  He reports having right knee pain, was seen by an orthopedist and put on a steroid taper.  Following this he states that he had an episode of significant diuresis, lost about 5 pounds.  Then started to experience a dull discomfort in his chest and some lightheadedness.  The symptoms subsided and were replaced by more sharp shooting chest discomfort and  ultimately stopped when he took a dose of omeprazole.  Symptoms have not been exertional.  He states he feels better today.  No definite palpitations.  The patient does not have symptoms concerning for COVID-19 infection (fever, chills, cough, or new shortness of breath).  He states that he does plan to get the vaccine.   Past Medical History:  Diagnosis Date  . Atrial fibrillation ([place])    Diagnosed 09/2009, on flecainide  . Bradycardia    Event monitor 2010: HR down to 45, asymptomatic.  [name] Coronary atherosclerosis of native coronary artery    Nonobstructive and distal disease by cath 2005.   [name] Essential hypertension   . Hyperlipidemia   . OSA (obstructive sleep apnea)   . Renal cyst   . Ringing in ears   . Sleep apnea    Past Surgical History:  Procedure Laterality Date  . BIOPSY  01/04/2018   Procedure: BIOPSY;  Surgeon: [name], MD;  Location: [facility];  Service: Endoscopy;;  ascending colon   . CARDIAC CATHETERIZATION  2005  . COLONOSCOPY  05/2006   SLF: 3 mm sigmoid: Tubular adenoma removed.  . COLONOSCOPY N/A 05/06/2016   Dr. [name]: two sessile polyps in mid transverse colon and hepatic flexure (tubular adenomas). ext/int hemorrhoids. tortuous left colon.   . COLONOSCOPY WITH PROPOFOL N/A 01/04/2018   Procedure: COLONOSCOPY WITH PROPOFOL;  Surgeon: [name], MD;  Location: [facility];  Service: Endoscopy;  Laterality: N/A;  2:30pm  . KNEE ARTHROSCOPY    . Left anterior cruciate ligament reconstruction    . left shoulder surgery     dr . [name]   . POLYPECTOMY  05/06/2016   Procedure: POLYPECTOMY;  Surgeon: [name], MD;  Location: [facility];  Service: Endoscopy;;  transverse colon polyp, hepatic flexure polyp,   . Right shoulder surgery    . ROBOT ASSISTED LAPAROSCOPIC NEPHRECTOMY Left 07/04/2018   Procedure: XI ROBOTIC ASSISTED LAPAROSCOPIC RENAL CYST DECORTICATION;  Surgeon: [name], MD;  Location: [facility];  Service:  Urology;  Laterality: Left;  3 HRS PROCEDURE: ROBOTIC RENAL CYST DECORTICATION  . TONSILLECTOMY    . TOTAL KNEE ARTHROPLASTY       Current Meds  Medication Sig  . amLODipine (NORVASC) 10 MG tablet Take 10 mg by mouth daily.   . baclofen (LIORESAL) 10 MG tablet Take po qhs as needed for back pain  . flecainide (TAMBOCOR) 50 MG tablet TAKE 1 TABLET BY MOUTH  TWICE A DAY (Patient taking differently: Take 50 mg by mouth 2 (two) times daily. )  . furosemide (LASIX) 40 MG tablet Take 40 mg by mouth daily as needed for fluid.   [name] losartan (COZAAR) 100 MG tablet Take 100 mg by mouth daily.   [name] omeprazole (PRILOSEC) 20 MG capsule TAKE 1 CAPSULE BY MOUTH  DAILY  . pravastatin (PRAVACHOL) 40 MG tablet Take 40 mg by mouth at bedtime.   . tamsulosin (FLOMAX) 0.4 MG CAPS capsule Take 0.4 mg by mouth every other day.      Allergies:   Clindamycin/lincomycin and Penicillins   ROS:   Right knee pain.  Prior CV studies:   The following studies were reviewed today:  GXT 01/03/2019:  Blood pressure demonstrated a hypertensive response to exercise.  There was no ST segment deviation noted during stress.  [facility] treadmill score portends a low risk of cardiac events.  Labs/Other Tests and Data Reviewed:    EKG:  An ECG dated 06/28/2018 was personally reviewed today and demonstrated:  Sinus bradycardia with QRS duration 116 ms.  Recent Labs: 10/21/2018: Hemoglobin 14.8; Platelets 271; TSH 1.65 04/21/2019: ALT 15; BUN 15; Creatinine, Ser 1.39; Potassium 4.3; Sodium 141   Recent Lipid Panel Lab Results  Component Value Date/Time   CHOL 207 (A) 10/21/2018 12:00 AM   TRIG 145 10/21/2018 12:00 AM   HDL 41 10/21/2018 12:00 AM   LDLCALC 137 10/21/2018 12:00 AM    Wt Readings from Last 3 Encounters:  07/18/19 285 lb (129.3 kg)  04/17/19 287 lb 3.2 oz (130.3 kg)  12/26/18 290 lb (131.5 kg)     Objective:    Vital Signs:  Ht 6\' 2"  (1.88 m)   Wt 285 lb (129.3 kg)   BMI 36.59 kg/m    Unable to  obtain vital signs today. Patient spoke in full sentences, not short of breath. No audible wheezing or coughing. Speech pattern normal.  ASSESSMENT & PLAN:    1.  Recent atypical chest discomfort, presently resolved.  He will take a 2-week course of omeprazole.  Also plan to have him come by the office for an ECG.  He does have a history of mild coronary atherosclerosis, no exertional chest pain, and negative GXT in August of last year.  2.  Paroxysmal atrial fibrillation.  We will follow-up ECG as noted above.  He continues on flecainide for rhythm suppression, not on AV nodal blocker given resting bradycardia.  CHA2DS2-VASc or is 3,  he has preferred to hold off on anticoagulation.   Time:   Today, I have spent 8 minutes with the patient with telehealth technology discussing the above problems.     Medication Adjustments/Labs and Tests Ordered: Current medicines are reviewed at length with the patient today.  Concerns regarding medicines are outlined above.   Tests Ordered: Orders Placed This Encounter  Procedures  . EKG 12-Lead    Medication Changes: No orders of the defined types were placed in this encounter.   Follow Up:  In Person 6 months in the [place] office.  Signed, Rozann Lesches, MD  07/18/2019 1:45 PM    [place]

## 2019-07-20 ENCOUNTER — Ambulatory Visit: Payer: Medicare Other | Attending: Internal Medicine

## 2019-07-20 DIAGNOSIS — Z23 Encounter for immunization: Secondary | ICD-10-CM

## 2019-07-20 NOTE — Progress Notes (Signed)
   Covid-19 Vaccination Clinic  Name:  Jonathan Dyer    MRN: [id number] DOB: 1955/08/10  07/20/2019  [name] was observed post Covid-19 immunization for 30 minutes based on pre-vaccination screening without incident. He was provided with Vaccine Information Sheet and instruction to access the V-Safe system.   [name] was instructed to call 911 with any severe reactions post vaccine: [name] Difficulty breathing  . Swelling of face and throat  . A fast heartbeat  . A bad rash all over body  . Dizziness and weakness   Immunizations Administered    Name Date Dose VIS Date Route   Moderna COVID-19 Vaccine 07/20/2019  9:30 AM 0.5 mL 04/11/2019 Intramuscular   Manufacturer: Moderna   Lot: [id number]   [id number]

## 2019-08-07 ENCOUNTER — Ambulatory Visit (INDEPENDENT_AMBULATORY_CARE_PROVIDER_SITE_OTHER): Payer: Medicare Other | Admitting: Family Medicine

## 2019-08-07 ENCOUNTER — Other Ambulatory Visit: Payer: Self-pay

## 2019-08-07 VITALS — BP 144/78

## 2019-08-07 DIAGNOSIS — I10 Essential (primary) hypertension: Secondary | ICD-10-CM

## 2019-08-17 ENCOUNTER — Telehealth: Payer: Self-pay | Admitting: Cardiology

## 2019-08-17 MED ORDER — LOSARTAN POTASSIUM 100 MG PO TABS: 100.0000 mg | ORAL_TABLET | Freq: Every day | ORAL | 3 refills | Status: DC

## 2019-08-17 MED ORDER — FLECAINIDE ACETATE 50 MG PO TABS: 50.0000 mg | ORAL_TABLET | Freq: Two times a day (BID) | ORAL | 3 refills | Status: DC

## 2019-08-17 MED ORDER — AMLODIPINE BESYLATE 10 MG PO TABS: 10.0000 mg | ORAL_TABLET | Freq: Every day | ORAL | 3 refills | Status: DC

## 2019-08-17 NOTE — Telephone Encounter (Signed)
Needing refills sent in to [facility]  for   amLODipine (NORVASC) 10 MG tablet [id number]   flecainide (TAMBOCOR) 50 MG tablet [id number]   losartan (COZAAR) 100 MG tablet [id number]

## 2019-08-23 ENCOUNTER — Ambulatory Visit: Payer: Medicare Other | Attending: Internal Medicine

## 2019-08-23 DIAGNOSIS — Z23 Encounter for immunization: Secondary | ICD-10-CM

## 2019-08-23 NOTE — Progress Notes (Signed)
   Covid-19 Vaccination Clinic  Name:  Jonathan Dyer    MRN: [id number] DOB: [birth year]  08/23/2019  [name] was observed post Covid-19 immunization for 15 minutes without incident. He was provided with Vaccine Information Sheet and instruction to access the V-Safe system.   [name] was instructed to call 911 with any severe reactions post vaccine: [name] Difficulty breathing  . Swelling of face and throat  . A fast heartbeat  . A bad rash all over body  . Dizziness and weakness   Immunizations Administered    Name Date Dose VIS Date Route   Moderna COVID-19 Vaccine 08/23/2019  8:21 AM 0.5 mL 04/11/2019 Intramuscular   Manufacturer: Moderna   [id number]   [id number]

## 2019-08-27 IMAGING — DX DG FOOT COMPLETE 3+V*R*
3 series · 3 of 3 positions shown · non-contrast
Comparison: None.

CLINICAL DATA: Foot pain, no injury

EXAM:
RIGHT FOOT COMPLETE - 3+ VIEW

[foot ap]
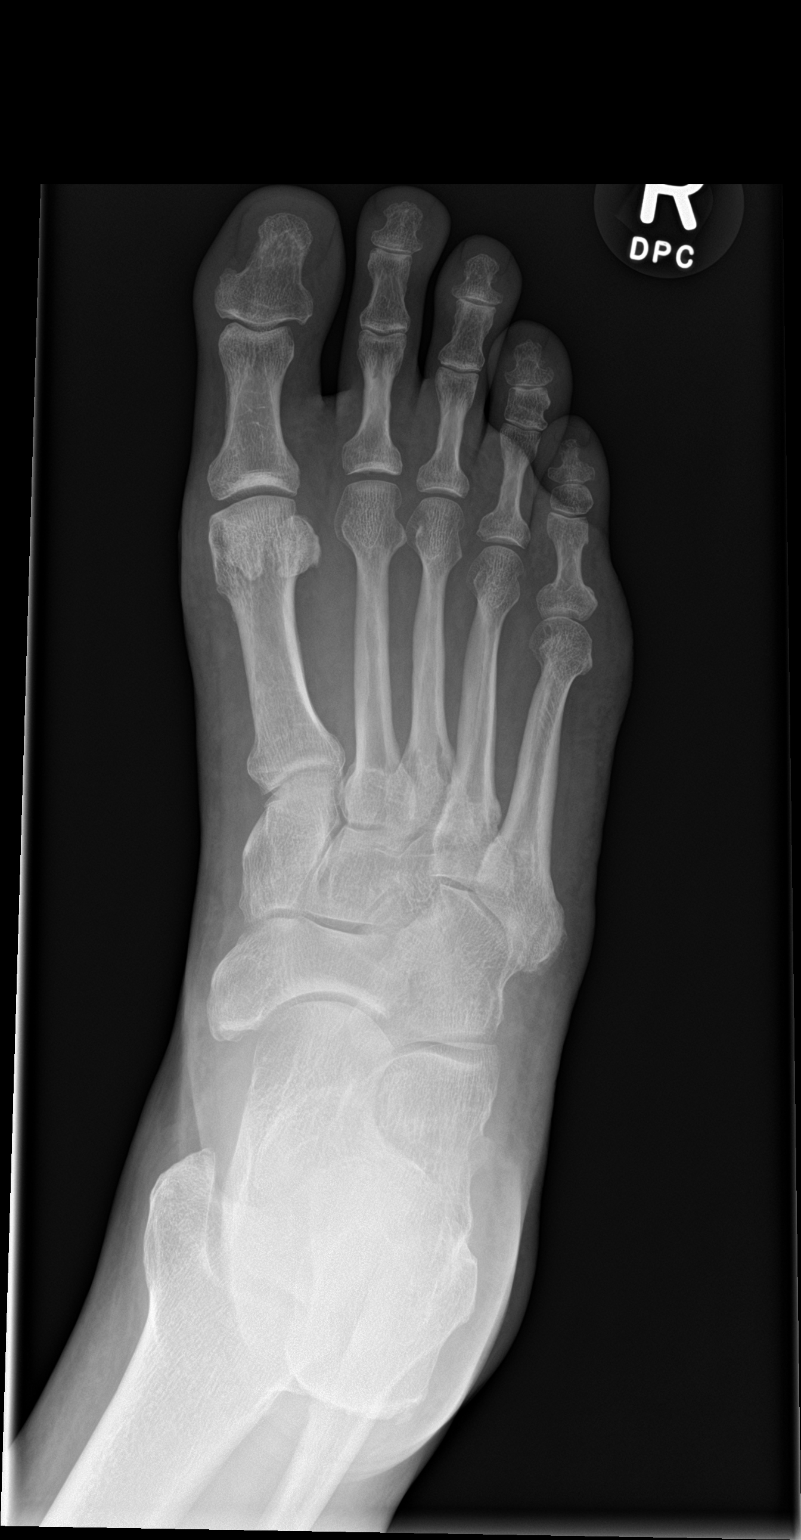

[foot obl]
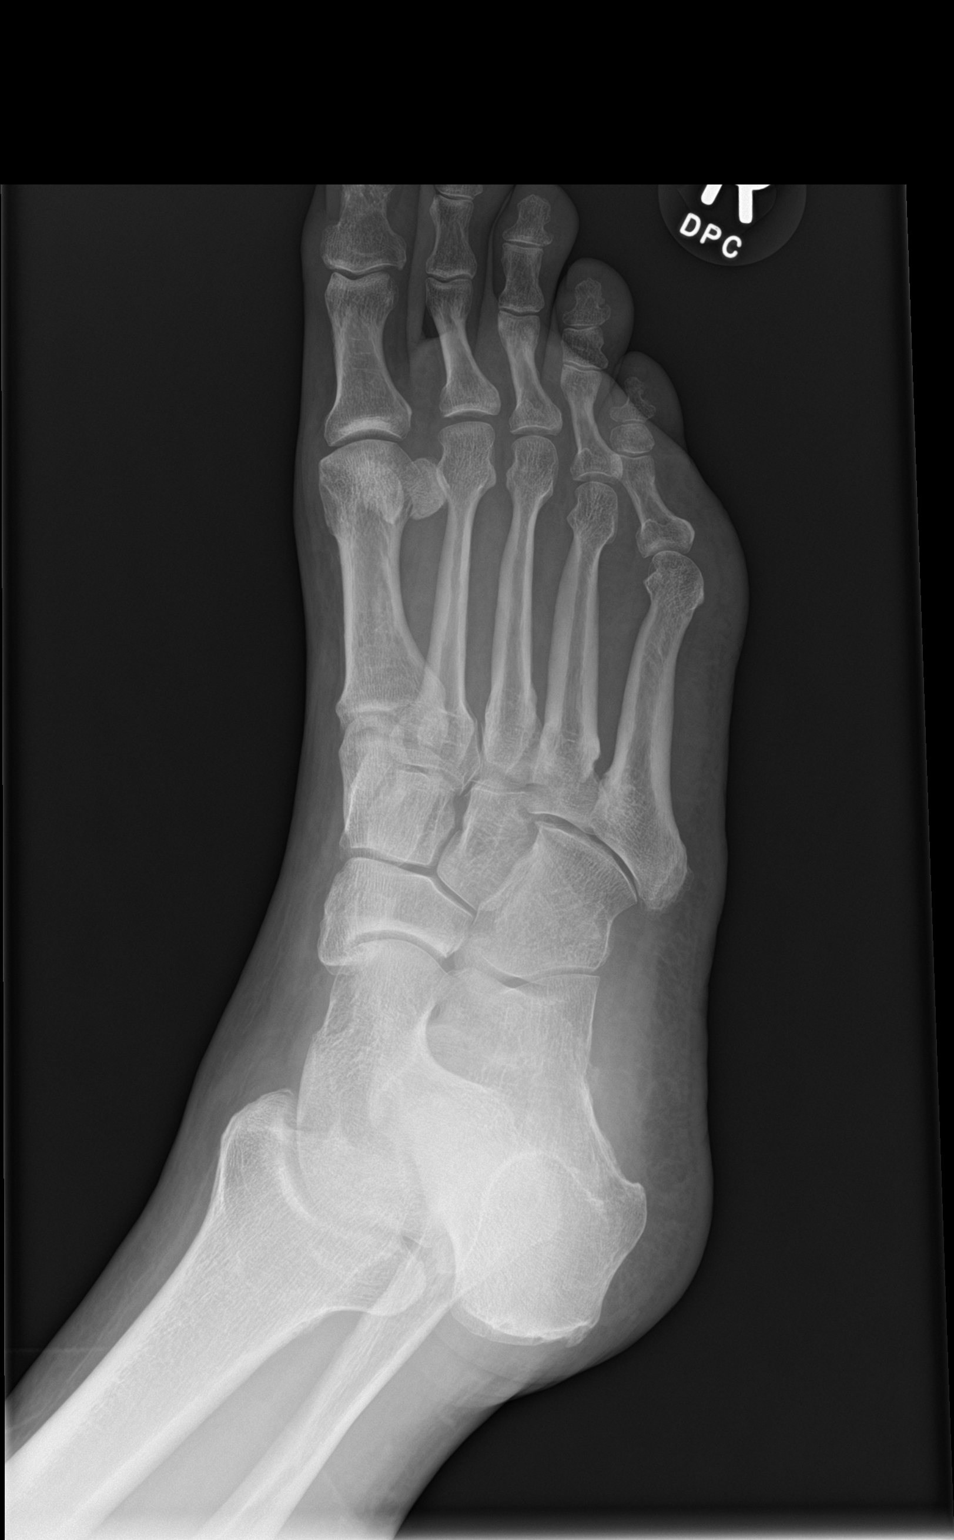

[foot lat]
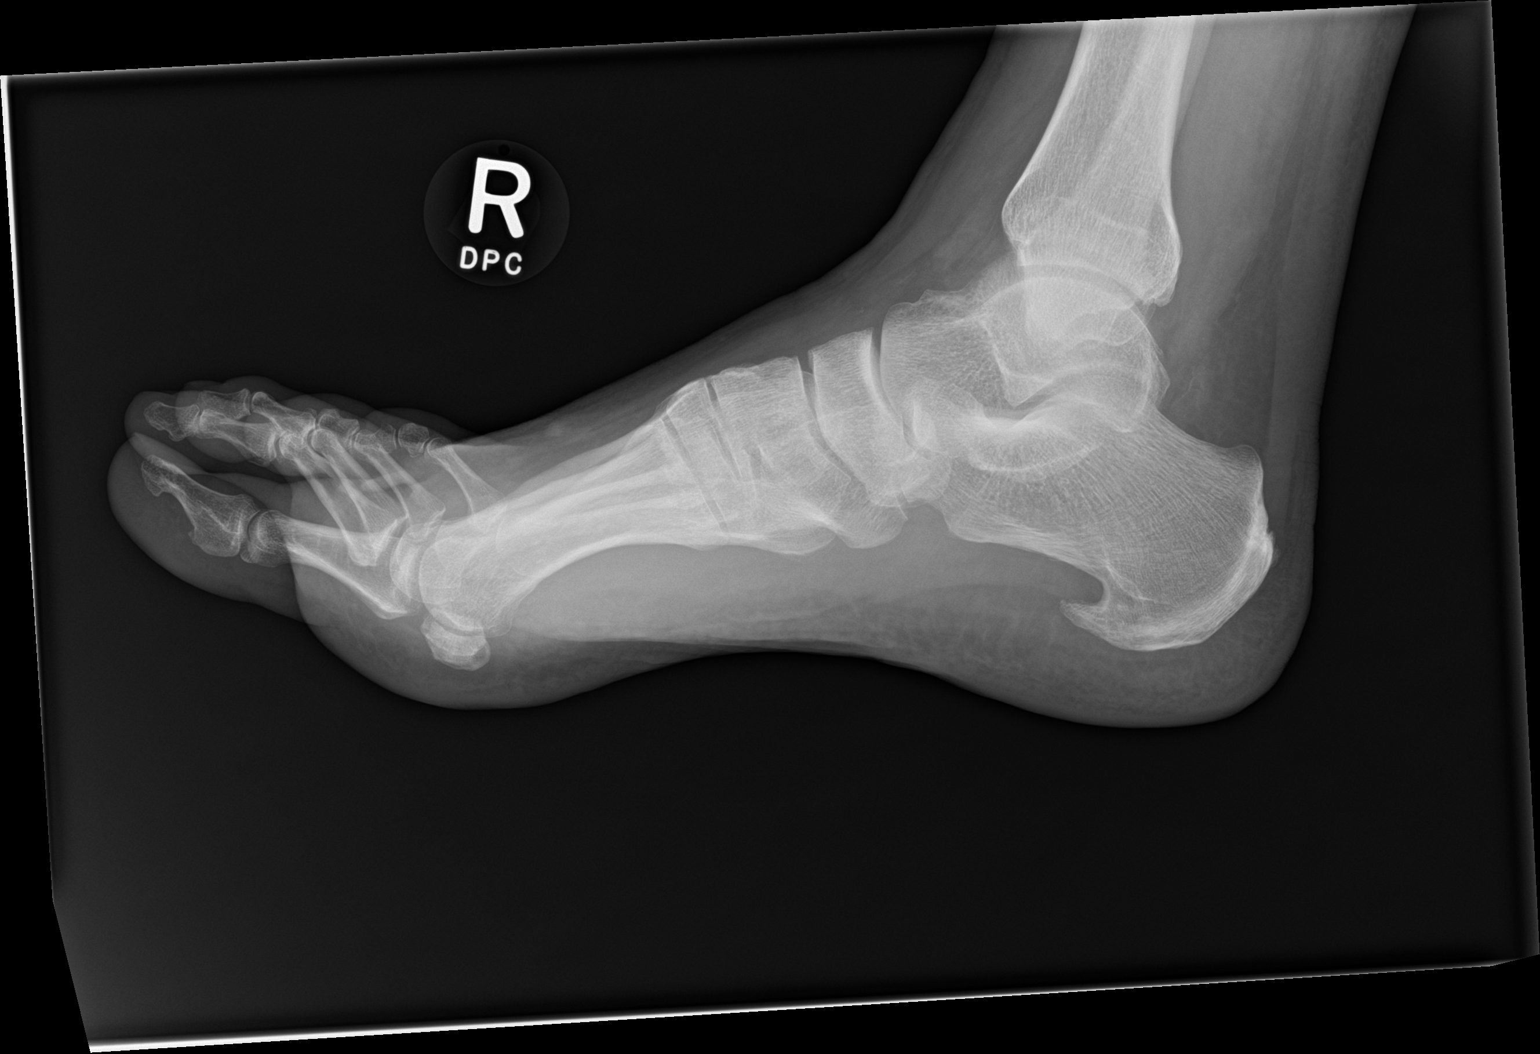

[3 of 3 positions shown; findings below may reference images not displayed]

FINDINGS: Normal alignment. No fracture. No significant arthropathy or
erosion. Prominent plantar spur on the calcaneus.
IMPRESSION: Calcaneal spur.  Negative for arthropathy or fracture.

## 2019-09-06 DIAGNOSIS — M1711 Unilateral primary osteoarthritis, right knee: Secondary | ICD-10-CM | POA: Diagnosis not present

## 2019-09-07 ENCOUNTER — Telehealth: Payer: Self-pay | Admitting: Cardiology

## 2019-09-07 DIAGNOSIS — Z79899 Other long term (current) drug therapy: Secondary | ICD-10-CM

## 2019-09-07 NOTE — Telephone Encounter (Signed)
Patient concerned home bp readings are elevated from what he has experienced from pcp's office. 135/72, 146/71, 150/71, 135/71, 145/71, 129/72, 144/75, 150/72, 161/72  HR ranges 47-72  Taking Amlodipine 10 mg qd, losartan 100 mg qd    He rode his bike 15 miles today and is attempting to lose weight.

## 2019-09-07 NOTE — Telephone Encounter (Signed)
Called stating he's been having problems with his BP reading high on his home cuff -- please give pt a call

## 2019-09-07 NOTE — Telephone Encounter (Signed)
Thank you for the update.  I am glad that he is trying to lose some more weight.  He is on top doses of Norvasc and losartan.  We could consider starting chlorthalidone 12.5 mg daily, but will need to keep an eye on his renal function.  Schedule BMET 7 -10 days after starting chlorthalidone.

## 2019-09-08 MED ORDER — CHLORTHALIDONE 25 MG PO TABS: 12.5000 mg | ORAL_TABLET | Freq: Every day | ORAL | 3 refills | Status: DC

## 2019-09-08 NOTE — Telephone Encounter (Signed)
Pt agrees to take chlorthalidone, I mailed lab slip

## 2019-09-12 ENCOUNTER — Ambulatory Visit: Payer: Medicare Other | Admitting: Family Medicine

## 2019-09-18 ENCOUNTER — Other Ambulatory Visit: Payer: Self-pay | Admitting: Urology

## 2019-09-18 ENCOUNTER — Other Ambulatory Visit (HOSPITAL_COMMUNITY)
Admission: RE | Admit: 2019-09-18 | Discharge: 2019-09-18 | Disposition: A | Discharge status: Home / Self Care | Payer: Medicare Other | Source: Ambulatory Visit | Attending: Cardiology | Admitting: Cardiology

## 2019-09-18 ENCOUNTER — Other Ambulatory Visit: Payer: Self-pay

## 2019-09-18 ENCOUNTER — Telehealth: Payer: Self-pay

## 2019-09-18 DIAGNOSIS — Z79899 Other long term (current) drug therapy: Secondary | ICD-10-CM | POA: Diagnosis not present

## 2019-09-18 LAB — BASIC METABOLIC PANEL
Anion gap: 10 (ref 5–15)
BUN: 18 mg/dL (ref 8–23)
CO2: 26 mmol/L (ref 22–32)
Calcium: 8.8 mg/dL — ABNORMAL LOW (ref 8.9–10.3)
Chloride: 100 mmol/L (ref 98–111)
Creatinine, Ser: 1.22 mg/dL (ref 0.61–1.24)
GFR calc Af Amer: >60 mL/min (ref >60)
GFR calc non Af Amer: >60 mL/min (ref >60)
Glucose, Bld: 115 mg/dL — ABNORMAL HIGH (ref 70–99)
Potassium: 3.5 mmol/L (ref 3.5–5.1)
Sodium: 136 mmol/L (ref 135–145)

## 2019-09-18 MED ORDER — POTASSIUM CHLORIDE ER 10 MEQ PO TBCR: 10.0000 meq | EXTENDED_RELEASE_TABLET | Freq: Every day | ORAL | 3 refills | Status: DC

## 2019-09-18 NOTE — Telephone Encounter (Signed)
Patient will start potassium 10 meq daily

## 2019-09-18 NOTE — Telephone Encounter (Signed)
-----   Message from Satira Sark, MD sent at 09/18/2019 10:24 AM EDT ----- Results reviewed.  Renal function steady after recent addition of chlorthalidone, actually creatinine is better at 1.22, previously 1.39.  Potassium low normal so would start KCl 10 mEq daily.

## 2019-10-02 ENCOUNTER — Encounter (INDEPENDENT_AMBULATORY_CARE_PROVIDER_SITE_OTHER): Payer: Self-pay | Admitting: Internal Medicine

## 2019-10-02 ENCOUNTER — Ambulatory Visit (INDEPENDENT_AMBULATORY_CARE_PROVIDER_SITE_OTHER): Payer: Medicare Other | Admitting: Internal Medicine

## 2019-10-02 ENCOUNTER — Other Ambulatory Visit: Payer: Self-pay

## 2019-10-02 VITALS — BP 133/72 | HR 63 | Temp 97.1°F | Ht 75.0 in | Wt 293.0 lb

## 2019-10-02 DIAGNOSIS — E669 Obesity, unspecified: Secondary | ICD-10-CM

## 2019-10-02 DIAGNOSIS — R972 Elevated prostate specific antigen [PSA]: Secondary | ICD-10-CM

## 2019-10-02 DIAGNOSIS — E559 Vitamin D deficiency, unspecified: Secondary | ICD-10-CM

## 2019-10-02 DIAGNOSIS — R5381 Other malaise: Secondary | ICD-10-CM

## 2019-10-02 DIAGNOSIS — R7303 Prediabetes: Secondary | ICD-10-CM

## 2019-10-02 DIAGNOSIS — E782 Mixed hyperlipidemia: Secondary | ICD-10-CM | POA: Diagnosis not present

## 2019-10-02 DIAGNOSIS — R5383 Other fatigue: Secondary | ICD-10-CM | POA: Diagnosis not present

## 2019-10-02 DIAGNOSIS — I10 Essential (primary) hypertension: Secondary | ICD-10-CM | POA: Diagnosis not present

## 2019-10-02 NOTE — Patient Instructions (Signed)
[facility] Dietary Recommendations for Weight Loss What to Avoid . Avoid added sugars o Often added sugar can be found in processed foods such as many condiments, dry cereals, cakes, cookies, chips, crisps, crackers, candies, sweetened drinks, etc.  o Read labels and AVOID/DECREASE use of foods with the following in their ingredient list: Sugar, fructose, high fructose corn syrup, sucrose, glucose, maltose, dextrose, molasses, cane sugar, brown sugar, any type of syrup, agave nectar, etc.   . Avoid snacking in between meals . Avoid foods made with flour o If you are going to eat food made with flour, choose those made with whole-grains; and, minimize your consumption as much as is tolerable . Avoid processed foods o These foods are generally stocked in the middle of the grocery store. Focus on shopping on the perimeter of the grocery.  . Avoid Meat  o We recommend following a plant-based diet at [facility]. Thus, we recommend avoiding meat as a general rule. Consider eating beans, legumes, eggs, and/or dairy products for regular protein sources o If you plan on eating meat limit to 4 ounces of meat at a time and choose lean options such as Fish, chicken, turkey. Avoid red meat intake such as pork and/or steak What to Include . Vegetables o GREEN LEAFY VEGETABLES: Kale, spinach, mustard greens, collard greens, cabbage, broccoli, etc. o OTHER: Asparagus, cauliflower, eggplant, carrots, peas, Brussel sprouts, tomatoes, bell peppers, zucchini, beets, cucumbers, etc. . Grains, seeds, and legumes o Beans: kidney beans, black eyed peas, garbanzo beans, black beans, pinto beans, etc. o Whole, unrefined grains: brown rice, barley, bulgur, oatmeal, etc. . Healthy fats  o Avoid highly processed fats such as vegetable oil o Examples of healthy fats: avocado, olives, virgin olive oil, dark chocolate (?72% Cocoa), nuts (peanuts, almonds, walnuts, cashews, pecans, etc.) . None to Low  Intake of Animal Sources of Protein o Meat sources: chicken, turkey, salmon, tuna. Limit to 4 ounces of meat at one time. o Consider limiting dairy sources, but when choosing dairy focus on: PLAIN Greek yogurt, cottage cheese, high-protein milk . Fruit o Choose berries  When to Eat . Intermittent Fasting: o Choosing not to eat for a specific time period, but DO FOCUS ON HYDRATION when fasting o Multiple Techniques: - Time Restricted Eating: eat 3 meals in a day, each meal lasting no more than 60 minutes, no snacks between meals - 16-18 hour fast: fast for 16 to 18 hours up to 7 days a week. Often suggested to start with 2-3 nonconsecutive days per week.  . Remember the time you sleep is counted as fasting.  . Examples of eating schedule: Fast from 7:00pm-11:00am. Eat between 11:00am-7:00pm.  - 24-hour fast: fast for 24 hours up to every other day. Often suggested to start with 1 day per week . Remember the time you sleep is counted as fasting . Examples of eating schedule:  o Eating day: eat 2-3 meals on your eating day. If doing 2 meals, each meal should last no more than 90 minutes. If doing 3 meals, each meal should last no more than 60 minutes. Finish last meal by 7:00pm. o Fasting day: Fast until 7:00pm.  o IF YOU FEEL UNWELL FOR ANY REASON/IN ANY WAY WHEN FASTING, STOP FASTING BY EATING A NUTRITIOUS SNACK OR LIGHT MEAL o ALWAYS FOCUS ON HYDRATION DURING FASTS - Acceptable Hydration sources: water, broths, tea/coffee (black tea/coffee is best but using a small amount of whole-fat dairy products in coffee/tea is acceptable).  -   Poor Hydration Sources: anything with sugar or artificial sweeteners added to it  These recommendations have been developed for patients that are actively receiving medical care from either Dr. Ishmeal Rorie or Sarah Gray, DNP, NP-C at [facility]. These recommendations are developed for patients with specific medical conditions and are not meant to be  distributed or used by others that are not actively receiving care from either provider listed above at [facility]. It is not appropriate to participate in the above eating plans without proper medical supervision.   Reference: Fung, J. The obesity code. [place]: [place]; 2016.   

## 2019-10-02 NOTE — Progress Notes (Signed)
Metrics: Intervention Frequency ACO  Documented Smoking Status Yearly  Screened one or more times in 24 months  Cessation Counseling or  Active cessation medication Past 24 months  Past 24 months   Guideline developer: UpToDate (See UpToDate for funding source) Date Released: 2014       Wellness Office Visit  Subjective:  Patient ID: Braelon Brockmeier, male    DOB: 1955/06/10  Age: 64 y.o. MRN: [id number]  CC: This 64 year old man comes in to establish care. HPI:  His main concern is his weight.  He has gained significant weight over the last several months. He has a history of coronary artery disease, prediabetes, symptoms of BPH, fatigue, sleep apnea. He wants to be able to do a bike ride that [place] on [place] and he wants to try and do this by next year.  He loves cycling outside. He admits that his weakness is the sugary food.  He drinks sodas. He is hypertensive.  His blood pressure medicine had to be increased recently by his cardiologist. He has a history of elevated PSA and this is being followed by urology and tends to remain higher.  Past Medical History:  Diagnosis Date  . Atrial fibrillation ([place])    Diagnosed 09/2009, on flecainide  . Bradycardia    Event monitor 2010: HR down to 45, asymptomatic.  [name] Coronary atherosclerosis of native coronary artery    Nonobstructive and distal disease by cath 2005.   [name] Essential hypertension   . Hyperlipidemia   . OSA (obstructive sleep apnea)   . Renal cyst   . Ringing in ears   . Sleep apnea       Family History  Problem Relation Age of Onset  . Pulmonary embolism Mother   . Lung disease Father   . Hyperlipidemia Brother   . Hypertension Brother   . Diabetes Other        Significant family h/o DM  . Arthritis Sister   . 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   Social History   Tobacco Use  . Smoking status: Never Smoker  . Smokeless tobacco: Never Used  Substance Use Topics  . Alcohol use: No    Alcohol/week: 0.0 standard drinks    Current Meds  Medication Sig  . amLODipine (NORVASC) 10 MG tablet Take 1 tablet (10 mg total) by mouth daily.  . baclofen (LIORESAL) 10 MG tablet Take po qhs as needed for back pain  . chlorthalidone (HYGROTON) 25 MG tablet Take 0.5 tablets (12.5 mg total) by mouth daily.  . flecainide (TAMBOCOR) 50 MG tablet Take 1 tablet (50 mg total) by mouth 2 (two) times daily.  . furosemide (LASIX) 40 MG tablet Take 40 mg by mouth daily as needed for fluid.   [name] losartan (COZAAR) 100 MG tablet Take 1 tablet (100 mg total) by mouth daily.  [name] omeprazole (PRILOSEC) 20 MG capsule TAKE 1 CAPSULE BY MOUTH  DAILY  . potassium chloride (KLOR-CON) 10 MEQ tablet Take 1 tablet (10 mEq total) by mouth daily.  . pravastatin (PRAVACHOL) 40 MG tablet Take 40 mg by mouth at bedtime.   . tamsulosin (FLOMAX) 0.4 MG CAPS capsule TAKE 1 CAPSULE BY MOUTH EVERY DAY      Depression screen [facility] 2/9 04/17/2019 04/17/2019 08/01/2014  Decreased Interest 0 0 0  Down, Depressed, Hopeless 0  0 0  PHQ - 2 Score 0 0 0     Objective:   Today's Vitals: BP 133/72 (BP Location: Right Arm, Patient Position: Sitting, Cuff Size: Normal)   Pulse 63   Temp (!) 97.1 F (36.2 C) (Temporal)   Ht 6\' 3"  (1.905 m)   Wt 293 lb (132.9 kg)   BMI 36.62 kg/m  Vitals with BMI 10/02/2019 08/07/2019 07/18/2019  Height 6\' 3"  - 6\' 2"   Weight 293 lbs - 285 lbs  BMI [id number] - [id number]  Systolic [id number] (No Data)  Diastolic 72 78 (No Data)  Pulse 63 - -     Physical Exam  He looks systemically well but is clearly obese.  Blood pressure is reasonable now.  He is alert and orientated without any obvious focal neurological signs.     Assessment   1. Prediabetes   2. Essential hypertension, benign   3. Obesity (BMI 30-39.9)   4. Mixed hyperlipidemia   5.  Malaise and fatigue   6. Vitamin D deficiency disease   7. Elevated PSA       Tests ordered Orders Placed This Encounter  Procedures  . COMPLETE METABOLIC PANEL WITH GFR  . Cardio IQ Adv Lipid and Inflamm Pnl  . Hemoglobin A1c  . Other/Misc lab test  . T3, free  . T4  . TSH  . VITAMIN D 25 Hydroxy (Vit-D Deficiency, Fractures)  . PSA, Total with Reflex to PSA, Free     Plan: 1. Blood work is ordered above. 2. We will check him for insulin resistance as well as cardio IQ lipid panel. 3. I explained the philosophy of our practice and that I will help him lose weight and become healthier so that he can do the bike riding wants to do in about a years time. 4. I reviewed his medical record, disease processes involved and started by recommending that he starts drinking about a gallon of water every day, avoid sodas, avoid any sugary foods and start doing fasting for 16 hours every day.  I will explained this in more detail next visit. 5. Follow-up in about a month's time. 6. Today I spent 45 minutes with this patient discussing all of the above.   No orders of the defined types were placed in this encounter.   Doree Albee, MD

## 2019-10-03 DIAGNOSIS — E782 Mixed hyperlipidemia: Secondary | ICD-10-CM | POA: Diagnosis not present

## 2019-10-03 DIAGNOSIS — R5383 Other fatigue: Secondary | ICD-10-CM | POA: Diagnosis not present

## 2019-10-03 DIAGNOSIS — R7303 Prediabetes: Secondary | ICD-10-CM | POA: Diagnosis not present

## 2019-10-03 DIAGNOSIS — R972 Elevated prostate specific antigen [PSA]: Secondary | ICD-10-CM | POA: Diagnosis not present

## 2019-10-03 DIAGNOSIS — I10 Essential (primary) hypertension: Secondary | ICD-10-CM | POA: Diagnosis not present

## 2019-10-03 DIAGNOSIS — E559 Vitamin D deficiency, unspecified: Secondary | ICD-10-CM | POA: Diagnosis not present

## 2019-10-03 DIAGNOSIS — R5381 Other malaise: Secondary | ICD-10-CM | POA: Diagnosis not present

## 2019-10-03 DIAGNOSIS — E669 Obesity, unspecified: Secondary | ICD-10-CM | POA: Diagnosis not present

## 2019-10-07 LAB — CARDIO IQ INSULIN RESISTANCE PANEL WITH SCORE
C-PEPTIDE, LC/MS/MS: 2.91 ng/mL — ABNORMAL HIGH (ref 0.68–2.16)
INSULIN, INTACT, LC/MS/MS: 12 u[IU]/mL (ref <=16)
Insulin Resistance Score: 77 — ABNORMAL HIGH (ref <=66)

## 2019-10-07 LAB — COMPLETE METABOLIC PANEL WITH GFR
AG Ratio: 1.5 (calc) (ref 1.0–2.5)
ALT: 14 U/L (ref 9–46)
AST: 15 U/L (ref 10–35)
Albumin: 4.3 g/dL (ref 3.6–5.1)
Alkaline phosphatase (APISO): 95 U/L (ref 35–144)
BUN/Creatinine Ratio: 12 (calc) (ref 6–22)
BUN: 15 mg/dL (ref 7–25)
CO2: 31 mmol/L (ref 20–32)
Calcium: 9.3 mg/dL (ref 8.6–10.3)
Chloride: 99 mmol/L (ref 98–110)
Creat: 1.29 mg/dL — ABNORMAL HIGH (ref 0.70–1.25)
GFR, Est African American: 67 mL/min/{1.73_m2} (ref >=60)
GFR, Est Non African American: 57 mL/min/{1.73_m2} — ABNORMAL LOW (ref >=60)
Globulin: 2.8 g/dL (calc) (ref 1.9–3.7)
Glucose, Bld: 105 mg/dL — ABNORMAL HIGH (ref 65–99)
Potassium: 3.8 mmol/L (ref 3.5–5.3)
Sodium: 137 mmol/L (ref 135–146)
Total Bilirubin: 0.7 mg/dL (ref 0.2–1.2)
Total Protein: 7.1 g/dL (ref 6.1–8.1)

## 2019-10-07 LAB — HEMOGLOBIN A1C
Hgb A1c MFr Bld: 5.5 % of total Hgb (ref <5.7)
Mean Plasma Glucose: 111 (calc)
eAG (mmol/L): 6.2 (calc)

## 2019-10-07 LAB — CARDIO IQ ADV LIPID AND INFLAMM PNL
Apolipoprotein B: 124 mg/dL — ABNORMAL HIGH (ref <90)
Cholesterol: 207 mg/dL — ABNORMAL HIGH (ref <200)
HDL: 41 mg/dL (ref >39)
LDL Cholesterol (Calc): 131 mg/dL (calc) — ABNORMAL HIGH (ref <100)
LDL Large: 8247 nmol/L (ref >6729)
LDL Medium: 330 nmol/L — ABNORMAL HIGH (ref <215)
LDL Particle Number: 1656 nmol/L — ABNORMAL HIGH (ref <1138)
LDL Peak Size: 212.1 Angstrom — ABNORMAL LOW (ref >222.9)
LDL Small: 419 nmol/L — ABNORMAL HIGH (ref <142)
Lipoprotein (a): 169 nmol/L — ABNORMAL HIGH (ref <75)
Non-HDL Cholesterol (Calc): 166 mg/dL (calc) — ABNORMAL HIGH (ref <130)
PLAC: 137 nmol/min/mL — ABNORMAL HIGH (ref <124)
Total CHOL/HDL Ratio: 5 calc — ABNORMAL HIGH (ref <3.6)
Triglycerides: 208 mg/dL — ABNORMAL HIGH (ref <150)
hs-CRP: >10 mg/L — ABNORMAL HIGH (ref <1.0)

## 2019-10-07 LAB — REFLEX PSA, FREE
PSA, % Free: 17 % (calc) — ABNORMAL LOW (ref >25)
PSA, Free: 1.2 ng/mL

## 2019-10-07 LAB — T3, FREE: T3, Free: 3.3 pg/mL (ref 2.3–4.2)

## 2019-10-07 LAB — PSA, TOTAL WITH REFLEX TO PSA, FREE: PSA, Total: 7.1 ng/mL — ABNORMAL HIGH (ref <=4.0)

## 2019-10-07 LAB — VITAMIN D 25 HYDROXY (VIT D DEFICIENCY, FRACTURES): Vit D, 25-Hydroxy: 24 ng/mL — ABNORMAL LOW (ref 30–100)

## 2019-10-07 LAB — T4: T4, Total: 6.9 ug/dL (ref 4.9–10.5)

## 2019-10-07 LAB — TSH: TSH: 1.43 mIU/L (ref 0.40–4.50)

## 2019-10-16 ENCOUNTER — Telehealth (INDEPENDENT_AMBULATORY_CARE_PROVIDER_SITE_OTHER): Payer: Self-pay

## 2019-10-16 ENCOUNTER — Ambulatory Visit: Payer: Medicare Other | Admitting: Family Medicine

## 2019-10-16 NOTE — Telephone Encounter (Signed)
Please tell patient that I will discuss all his results in detail at his next appointment-confirm with him when this appointment is. Thanks

## 2019-10-16 NOTE — Telephone Encounter (Signed)
Please let him know that I will discuss all results in detail at his next appointment. Confirm with the patient his next appointment. Thanks

## 2019-10-16 NOTE — Telephone Encounter (Signed)
Called pt and notified of message. Pt will be in for 6/30 appt. Confirmed.

## 2019-10-16 NOTE — Telephone Encounter (Signed)
Pt called asking about results. Please advise.

## 2019-11-08 ENCOUNTER — Other Ambulatory Visit: Payer: Self-pay

## 2019-11-08 ENCOUNTER — Encounter (INDEPENDENT_AMBULATORY_CARE_PROVIDER_SITE_OTHER): Payer: Self-pay | Admitting: Internal Medicine

## 2019-11-08 ENCOUNTER — Ambulatory Visit (INDEPENDENT_AMBULATORY_CARE_PROVIDER_SITE_OTHER): Payer: Medicare Other | Admitting: Internal Medicine

## 2019-11-08 VITALS — BP 130/75 | HR 61 | Temp 96.8°F | Ht 75.0 in | Wt 288.0 lb

## 2019-11-08 DIAGNOSIS — R972 Elevated prostate specific antigen [PSA]: Secondary | ICD-10-CM

## 2019-11-08 DIAGNOSIS — E669 Obesity, unspecified: Secondary | ICD-10-CM

## 2019-11-08 DIAGNOSIS — E782 Mixed hyperlipidemia: Secondary | ICD-10-CM | POA: Diagnosis not present

## 2019-11-08 DIAGNOSIS — E559 Vitamin D deficiency, unspecified: Secondary | ICD-10-CM

## 2019-11-08 DIAGNOSIS — E8881 Metabolic syndrome: Secondary | ICD-10-CM

## 2019-11-08 DIAGNOSIS — I10 Essential (primary) hypertension: Secondary | ICD-10-CM | POA: Diagnosis not present

## 2019-11-08 MED ORDER — CIPROFLOXACIN HCL 500 MG PO TABS
500.0000 mg | ORAL_TABLET | Freq: Two times a day (BID) | ORAL | 0 refills | Status: DC
Start: 2019-11-08 — End: 2019-12-27

## 2019-11-08 NOTE — Progress Notes (Signed)
Metrics: Intervention Frequency ACO  Documented Smoking Status Yearly  Screened one or more times in 24 months  Cessation Counseling or  Active cessation medication Past 24 months  Past 24 months   Guideline developer: UpToDate (See UpToDate for funding source) Date Released: 2014       Wellness Office Visit  Subjective:  [name], male    DOB: [birth year]  Age: 64 y.o. MRN: [id number]  CC: This man comes in for follow-up to review all his blood work and further recommendations. HPI  I discussed all his blood results with him.  He has vitamin D deficiency which is quite severe. He has insulin resistance even though he is not even prediabetic now. His cardio IQ lipid panel is unfavorable. His elevated PSA is higher at 7.1 but the percent free PSA shows a moderate risk of malignancy.  We will need to repeat this, it is more than 10%. He is supposed to follow-up with urology in October. Past Medical History:  Diagnosis Date   Atrial fibrillation ([place])    Diagnosed 09/2009, on flecainide   Bradycardia    Event monitor 2010: HR down to 45, asymptomatic.   Coronary atherosclerosis of native coronary artery    Nonobstructive and distal disease by cath 2005.    Essential hypertension    Hyperlipidemia    OSA (obstructive sleep apnea)    Renal cyst    Ringing in ears    Sleep apnea    Past Surgical History:  Procedure Laterality Date   BIOPSY  01/04/2018   Procedure: BIOPSY;  Surgeon: [name], MD;  Location: [facility];  Service: Endoscopy;;  ascending colon    CARDIAC CATHETERIZATION  2005   COLONOSCOPY  05/2006   SLF: 3 mm sigmoid: Tubular adenoma removed.   COLONOSCOPY N/A 05/06/2016   Dr. [name]: two sessile polyps in mid transverse colon and hepatic flexure (tubular adenomas). ext/int hemorrhoids. tortuous left colon.    COLONOSCOPY WITH PROPOFOL N/A 01/04/2018   Procedure: COLONOSCOPY WITH PROPOFOL;  Surgeon: [name], MD;   Location: [facility];  Service: Endoscopy;  Laterality: N/A;  2:30pm   KNEE ARTHROSCOPY     Left anterior cruciate ligament reconstruction     left shoulder surgery     dr . Herbie Baltimore collins    POLYPECTOMY  05/06/2016   Procedure: POLYPECTOMY;  Surgeon: [name], MD;  Location: [facility];  Service: Endoscopy;;  transverse colon polyp, hepatic flexure polyp,    Right shoulder surgery     ROBOT ASSISTED LAPAROSCOPIC NEPHRECTOMY Left 07/04/2018   Procedure: XI ROBOTIC ASSISTED LAPAROSCOPIC RENAL CYST DECORTICATION;  Surgeon: [name], MD;  Location: [facility];  Service: Urology;  Laterality: Left;  3 HRSPROCEDURE: ROBOTIC RENAL CYST DECORTICATION   TONSILLECTOMY     TOTAL KNEE ARTHROPLASTY       Family History  Problem Relation Age of Onset   Pulmonary embolism Mother    Lung disease Father    Hyperlipidemia Brother    Hypertension Brother    Diabetes Other        Significant family h/o DM   Arthritis Sister    Colon cancer Neg Hx     Social History   Social History Narrative   Divorced since 1986,married for 10 years.Lives alone.Part time work,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   Social History   Tobacco Use   Smoking status: Never Smoker   Smokeless tobacco: Never Used  Substance Use Topics  Alcohol use: No    Alcohol/week: 0.0 standard drinks    Current Meds  Medication Sig   amLODipine (NORVASC) 10 MG tablet Take 1 tablet (10 mg total) by mouth daily.   chlorthalidone (HYGROTON) 25 MG tablet Take 0.5 tablets (12.5 mg total) by mouth daily.   flecainide (TAMBOCOR) 50 MG tablet Take 1 tablet (50 mg total) by mouth 2 (two) times daily.   furosemide (LASIX) 40 MG tablet Take 40 mg by mouth daily as needed for fluid.    losartan (COZAAR) 100 MG tablet Take 1 tablet (100 mg total) by mouth daily.   omeprazole (PRILOSEC) 20 MG capsule TAKE 1 CAPSULE BY MOUTH  DAILY   potassium chloride (KLOR-CON) 10 MEQ tablet Take 1 tablet  (10 mEq total) by mouth daily.   pravastatin (PRAVACHOL) 40 MG tablet Take 40 mg by mouth at bedtime.    tamsulosin (FLOMAX) 0.4 MG CAPS capsule TAKE 1 CAPSULE BY MOUTH EVERY DAY      Depression screen [facility] 2/9 04/17/2019 04/17/2019 08/01/2014  Decreased Interest 0 0 0  Down, Depressed, Hopeless 0 0 0  PHQ - 2 Score 0 0 0     Objective:   Today's Vitals: BP 130/75 (BP Location: Left Arm, Patient Position: Sitting, Cuff Size: Normal)    Pulse 61    Temp (!) 96.8 F (36 C) (Temporal)    Ht 6\' 3"  (1.905 m)    Wt 288 lb (130.6 kg)    SpO2 90%    BMI 36.00 kg/m  Vitals with BMI 11/08/2019 10/02/2019 08/07/2019  Height 6\' 3"  6\' 3"  -  Weight 288 lbs 293 lbs -  BMI 36 02.40 -  Systolic 973 532 992  Diastolic 75 72 78  Pulse 61 63 -     Physical Exam  He looks systemically well and he has lost 5 pounds already.  Blood pressure is in good range and he has not taken chlorthalidone.     Assessment   1. Obesity (BMI 30-39.9)   2. Essential hypertension, benign   3. Elevated PSA   4. Mixed hyperlipidemia   5. Insulin resistance   6. Vitamin D deficiency disease       Tests ordered No orders of the defined types were placed in this encounter.    Plan: 1. I discussed all his results in detail. 2. I recommended he start taking vitamin D3 10,000 units daily. 3. As far as the elevated PSA is concerned, I am going to treat him empirically with a 10-day course of ciprofloxacin in case this is prostatitis.  We will repeat the PSA with percentage free PSA the next time I see him. 4. We discussed his insulin resistance and his obesity.  I discussed the rationale behind intermittent fasting that he has already been doing and I want him to now fast for 18 hours every day if he can.  In addition, we discussed more of a plant-based diet and I have given him a diet sheet.  We discussed foods he should avoid including white bread, white rice and white potatoes. 5. I will see him in about 6  weeks time for follow-up when we will repeat the PSA and see how he is doing. 6. Today I spent about 55 minutes with this patient going over all his results and further recommendations regarding nutrition.   Meds ordered this encounter  Medications   ciprofloxacin (CIPRO) 500 MG tablet    Sig: Take 1 tablet (500 mg total) by mouth  2 (two) times daily.    Dispense:  20 tablet    Refill:  0    Abbott Jasinski Luther Parody, MD

## 2019-11-08 NOTE — Patient Instructions (Addendum)
VITAMIN D3 10,000 UNITS/DAY   [facility] Dietary Recommendations for Weight Loss What to Avoid . Avoid added sugars o Often added sugar can be found in processed foods such as many condiments, dry cereals, cakes, cookies, chips, crisps, crackers, candies, sweetened drinks, etc.  o Read labels and AVOID/DECREASE use of foods with the following in their ingredient list: Sugar, fructose, high fructose corn syrup, sucrose, glucose, maltose, dextrose, molasses, cane sugar, brown sugar, any type of syrup, agave nectar, etc.   . Avoid snacking in between meals . Avoid foods made with flour o If you are going to eat food made with flour, choose those made with whole-grains; and, minimize your consumption as much as is tolerable . Avoid processed foods o These foods are generally stocked in the middle of the grocery store. Focus on shopping on the perimeter of the grocery.  . Avoid Meat  o We recommend following a plant-based diet at [facility]. Thus, we recommend avoiding meat as a general rule. Consider eating beans, legumes, eggs, and/or dairy products for regular protein sources o If you plan on eating meat limit to 4 ounces of meat at a time and choose lean options such as Fish, chicken, turkey. Avoid red meat intake such as pork and/or steak What to Include . Vegetables o GREEN LEAFY VEGETABLES: Kale, spinach, mustard greens, collard greens, cabbage, broccoli, etc. o OTHER: Asparagus, cauliflower, eggplant, carrots, peas, Brussel sprouts, tomatoes, bell peppers, zucchini, beets, cucumbers, etc. . Grains, seeds, and legumes o Beans: kidney beans, black eyed peas, garbanzo beans, black beans, pinto beans, etc. o Whole, unrefined grains: brown rice, barley, bulgur, oatmeal, etc. . Healthy fats  o Avoid highly processed fats such as vegetable oil o Examples of healthy fats: avocado, olives, virgin olive oil, dark chocolate (?72% Cocoa), nuts (peanuts, almonds, walnuts,  cashews, pecans, etc.) . None to Low Intake of Animal Sources of Protein o Meat sources: chicken, turkey, salmon, tuna. Limit to 4 ounces of meat at one time. o Consider limiting dairy sources, but when choosing dairy focus on: PLAIN Greek yogurt, cottage cheese, high-protein milk . Fruit o Choose berries  When to Eat . Intermittent Fasting: o Choosing not to eat for a specific time period, but DO FOCUS ON HYDRATION when fasting o Multiple Techniques: - Time Restricted Eating: eat 3 meals in a day, each meal lasting no more than 60 minutes, no snacks between meals - 16-18 hour fast: fast for 16 to 18 hours up to 7 days a week. Often suggested to start with 2-3 nonconsecutive days per week.  . Remember the time you sleep is counted as fasting.  . Examples of eating schedule: Fast from 7:00pm-11:00am. Eat between 11:00am-7:00pm.  - 24-hour fast: fast for 24 hours up to every other day. Often suggested to start with 1 day per week . Remember the time you sleep is counted as fasting . Examples of eating schedule:  o Eating day: eat 2-3 meals on your eating day. If doing 2 meals, each meal should last no more than 90 minutes. If doing 3 meals, each meal should last no more than 60 minutes. Finish last meal by 7:00pm. o Fasting day: Fast until 7:00pm.  o IF YOU FEEL UNWELL FOR ANY REASON/IN ANY WAY WHEN FASTING, STOP FASTING BY EATING A NUTRITIOUS SNACK OR LIGHT MEAL o ALWAYS FOCUS ON HYDRATION DURING FASTS - Acceptable Hydration sources: water, broths, tea/coffee (black tea/coffee is best but using a small amount of whole-fat dairy products   in coffee/tea is acceptable).  - Poor Hydration Sources: anything with sugar or artificial sweeteners added to it  These recommendations have been developed for patients that are actively receiving medical care from either Dr. Purva Vessell or Sarah Gray, DNP, NP-C at [facility]. These recommendations are developed for patients with specific medical  conditions and are not meant to be distributed or used by others that are not actively receiving care from either provider listed above at [facility]. It is not appropriate to participate in the above eating plans without proper medical supervision.   Reference: Fung, J. The obesity code. [place]: [place]; 2016.   

## 2019-11-14 ENCOUNTER — Telehealth (INDEPENDENT_AMBULATORY_CARE_PROVIDER_SITE_OTHER): Payer: Self-pay

## 2019-11-14 NOTE — Telephone Encounter (Signed)
Although he has been fully vaccinated, it is possible that he does have Covid.  He needs to get a COVID-19 test.  We could offer him telemedicine visit if he would like.

## 2019-11-14 NOTE — Telephone Encounter (Signed)
Been fighting a head cold for 3 weeks, not feeling well still no energy, denies fever , coughing, head congestion, muscles burning, please advise?

## 2019-11-14 NOTE — Telephone Encounter (Signed)
Mr. Cuevas is going to give this a little more time and see how he is feeling

## 2019-11-16 ENCOUNTER — Other Ambulatory Visit: Payer: Self-pay

## 2019-11-16 ENCOUNTER — Encounter: Payer: Self-pay | Admitting: Emergency Medicine

## 2019-11-16 ENCOUNTER — Ambulatory Visit
Admission: EM | Admit: 2019-11-16 | Discharge: 2019-11-16 | Disposition: A | Discharge status: Home / Self Care | Payer: Medicare Other | Attending: Emergency Medicine | Admitting: Emergency Medicine

## 2019-11-16 DIAGNOSIS — J069 Acute upper respiratory infection, unspecified: Secondary | ICD-10-CM | POA: Diagnosis not present

## 2019-11-16 MED ORDER — PREDNISONE 10 MG PO TABS
20.0000 mg | ORAL_TABLET | Freq: Every day | ORAL | 0 refills | Status: AC
Start: 2019-11-16 — End: 2019-11-23

## 2019-11-16 MED ORDER — CETIRIZINE HCL 5 MG PO CHEW: 5.0000 mg | CHEWABLE_TABLET | Freq: Every day | ORAL | 0 refills | Status: DC

## 2019-11-16 MED ORDER — FLUTICASONE PROPIONATE 50 MCG/ACT NA SUSP
1.0000 | Freq: Every day | NASAL | 0 refills | Status: DC
Start: 2019-11-16 — End: 2020-02-20

## 2019-11-16 MED ORDER — BENZONATATE 100 MG PO CAPS
100.0000 mg | ORAL_CAPSULE | Freq: Three times a day (TID) | ORAL | 0 refills | Status: DC
Start: 2019-11-16 — End: 2020-02-20

## 2019-11-16 NOTE — Discharge Instructions (Signed)
Get plenty of rest and push fluids Tessalon Perles prescribed for cough Zyrtec-D prescribed for nasal congestion, runny nose, and/or sore throat Flonase prescribed for nasal congestion and runny nose Dose prednisone was prescribed Use medications daily for symptom relief Use OTC medications like ibuprofen or tylenol as needed fever or pain Call or go to the ED if you have any new or worsening symptoms such as fever, worsening cough, shortness of breath, chest tightness, chest pain, turning blue, changes in mental status, etc..[name]

## 2019-11-16 NOTE — ED Provider Notes (Signed)
[place]   [id number] 11/16/19 Arrival Time: [place]  Chief Complaint  Patient presents with  . Sore Throat     SUBJECTIVE: History from: patient.  Jonathan Dyer is a 64 y.o. male who presented to the urgent care for complaint of nasal congestion, postnasal drip, sore throat and fatigue for the past 2 weeks.  Denies sick exposure to strep, flu or mono, or precipitating event.  Has tried OTC medication with mild relief.  Denies aggravating or alleviating factors.  Denies previous symptoms in the past.   Denies fever, chills, fatigue, ear pain, sinus pain, rhinorrhea,  SOB, wheezing, chest pain, nausea, rash, changes in bowel or bladder habits.     ROS: As per HPI.  All other pertinent ROS negative.     Past Medical History:  Diagnosis Date  . Atrial fibrillation ([place])    Diagnosed 09/2009, on flecainide  . Bradycardia    Event monitor 2010: HR down to 45, asymptomatic.  [name] Coronary atherosclerosis of native coronary artery    Nonobstructive and distal disease by cath 2005.   [name] Essential hypertension   . Hyperlipidemia   . OSA (obstructive sleep apnea)   . Renal cyst   . Ringing in ears   . Sleep apnea    Past Surgical History:  Procedure Laterality Date  . BIOPSY  01/04/2018   Procedure: BIOPSY;  Surgeon: [name], MD;  [facility];  Service: Endoscopy;;  ascending colon   . CARDIAC CATHETERIZATION  2005  . COLONOSCOPY  05/2006   SLF: 3 mm sigmoid: Tubular adenoma removed.  . COLONOSCOPY N/A 05/06/2016   Dr. [name]: two sessile polyps in mid transverse colon and hepatic flexure (tubular adenomas). ext/int hemorrhoids. tortuous left colon.   . COLONOSCOPY WITH PROPOFOL N/A 01/04/2018   Procedure: COLONOSCOPY WITH PROPOFOL;  Surgeon: [name], MD;  [facility];  Service: Endoscopy;  Laterality: N/A;  2:30pm  . KNEE ARTHROSCOPY    . Left anterior cruciate ligament reconstruction    . left shoulder surgery     dr . [name]   . POLYPECTOMY  05/06/2016   Procedure: POLYPECTOMY;  Surgeon: [name], MD;  [facility];  Service: Endoscopy;;  transverse colon polyp, hepatic flexure polyp,   . Right shoulder surgery    . ROBOT ASSISTED LAPAROSCOPIC NEPHRECTOMY Left 07/04/2018   Procedure: XI ROBOTIC ASSISTED LAPAROSCOPIC RENAL CYST DECORTICATION;  Surgeon: [name], MD;  Location: [facility];  Service: Urology;  Laterality: Left;  3 HRSPROCEDURE: ROBOTIC RENAL CYST DECORTICATION  . TONSILLECTOMY    . TOTAL KNEE ARTHROPLASTY     Allergies  Allergen Reactions  . Clindamycin/Lincomycin Swelling and Other (See Comments)    Mycin drug family   . Penicillins Other (See Comments)    Has patient had a PCN reaction causing immediate rash, facial/tongue/throat swelling, SOB or lightheadedness with hypotension: unknown Has patient had a PCN reaction causing severe rash involving mucus membranes or skin necrosis: unknown Has patient had a PCN reaction that required hospitalization unknown Has patient had a PCN reaction occurring within the last 10 years: unknown If all of the above answers are "NO", then may proceed    No current facility-administered medications on file prior to encounter.   Current Outpatient Medications on File Prior to Encounter  Medication Sig Dispense Refill  . amLODipine (NORVASC) 10 MG tablet Take 1 tablet (10 mg total) by mouth daily. 90 tablet 3  . chlorthalidone (  HYGROTON) 25 MG tablet Take 0.5 tablets (12.5 mg total) by mouth daily. 45 tablet 3  . ciprofloxacin (CIPRO) 500 MG tablet Take 1 tablet (500 mg total) by mouth 2 (two) times daily. 20 tablet 0  . flecainide (TAMBOCOR) 50 MG tablet Take 1 tablet (50 mg total) by mouth 2 (two) times daily. 180 tablet 3  . furosemide (LASIX) 40 MG tablet Take 40 mg by mouth daily as needed for fluid.     [name] losartan (COZAAR) 100 MG tablet Take 1 tablet (100 mg total) by mouth daily. 90 tablet 3  . omeprazole (PRILOSEC) 20 MG  capsule TAKE 1 CAPSULE BY MOUTH  DAILY 90 capsule 3  . potassium chloride (KLOR-CON) 10 MEQ tablet Take 1 tablet (10 mEq total) by mouth daily. 90 tablet 3  . pravastatin (PRAVACHOL) 40 MG tablet Take 40 mg by mouth at bedtime.     . tamsulosin (FLOMAX) 0.4 MG CAPS capsule TAKE 1 CAPSULE BY MOUTH EVERY DAY 30 capsule 0   Social History   Socioeconomic History  . Marital status: Divorced    Spouse name: Not on file  . Number of children: 3  . Years of education: Not on file  . Highest education level: Not on file  Occupational History  . Occupation: PT-custodial work  Tobacco Use  . Smoking status: Never Smoker  . Smokeless tobacco: Never Used  Vaping Use  . Vaping Use: Never used  Substance and Sexual Activity  . Alcohol use: No    Alcohol/week: 0.0 standard drinks  . Drug use: No  . Sexual activity: Not Currently  Other Topics Concern  . 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   Social Determinants of Health   Financial Resource Strain:   . Difficulty of Paying Living Expenses:   Food Insecurity:   . Worried About Charity fundraiser in the Last Year:   . Arboriculturist in the Last Year:   Transportation Needs:   . Film/video editor (Medical):   [name] Lack of Transportation (Non-Medical):   Physical Activity:   . Days of Exercise per Week:   . Minutes of Exercise per Session:   Stress:   . Feeling of Stress :   Social Connections:   . Frequency of Communication with Friends and Family:   . Frequency of Social Gatherings with Friends and Family:   . Attends Religious Services:   . Active Member of Clubs or Organizations:   . Attends Archivist Meetings:   [name] Marital Status:   Intimate Partner Violence:   . Fear of Current or Ex-Partner:   . Emotionally Abused:   [name] Physically Abused:   . Sexually Abused:    Family History  Problem Relation  Age of Onset  . Pulmonary embolism Mother   . Lung disease Father   . Hyperlipidemia Brother   . Hypertension Brother   . Diabetes Other        Significant family h/o DM  . Arthritis Sister   . Colon cancer Neg Hx     OBJECTIVE:  Vitals:   11/16/19 0921 11/16/19 0937  BP: 135/80   Pulse: (!) 58   Resp: 17   Temp: 98.3 F (36.8 C)   TempSrc: Oral   SpO2: 98%   Weight:  280 lb (127 kg)  Height:  6\' 3"  (1.905 m)     General appearance: alert;  appears fatigued, but nontoxic, speaking in full sentences and managing own secretions HEENT: NCAT; Ears: EACs clear, TMs pearly gray with visible cone of light, without erythema; Eyes: PERRL, EOMI grossly; Nose: no obvious rhinorrhea; Throat: oropharynx clear, tonsils 1+ and non erythematous without white tonsillar exudates, uvula midline Neck: supple without LAD Lungs: CTA bilaterally without adventitious breath sounds;  Mild cough Heart: regular rate and rhythm.  Radial pulses 2+ symmetrical bilaterally Skin: warm and dry Psychological: alert and cooperative; normal mood and affect  LABS: No results found for this or any previous visit (from the past 24 hour(s)).   ASSESSMENT & PLAN:  1. URI with cough and congestion     Meds ordered this encounter  Medications  . fluticasone (FLONASE) 50 MCG/ACT nasal spray    Sig: Place 1 spray into both nostrils daily for 14 days.    Dispense:  16 g    Refill:  0  . cetirizine (ZYRTEC) 5 MG chewable tablet    Sig: Chew 1 tablet (5 mg total) by mouth daily.    Dispense:  30 tablet    Refill:  0  . benzonatate (TESSALON) 100 MG capsule    Sig: Take 1 capsule (100 mg total) by mouth every 8 (eight) hours.    Dispense:  30 capsule    Refill:  0  . predniSONE (DELTASONE) 10 MG tablet    Sig: Take 2 tablets (20 mg total) by mouth daily for 7 days.    Dispense:  14 tablet    Refill:  0   Discharge instructions  Get plenty of rest and push fluids Tessalon Perles prescribed for  cough Zyrtec-D prescribed for nasal congestion, runny nose, and/or sore throat Flonase prescribed for nasal congestion and runny nose Dose prednisone was prescribed Use medications daily for symptom relief Use OTC medications like ibuprofen or tylenol as needed fever or pain Call or go to the ED if you have any new or worsening symptoms such as fever, worsening cough, shortness of breath, chest tightness, chest pain, turning blue, changes in mental status, etc...  Reviewed expectations re: course of current medical issues. Questions answered. Outlined signs and symptoms indicating need for more acute intervention. Patient verbalized understanding. After Visit Summary given.     Note: This document was prepared using Dragon voice recognition software and may include unintentional dictation errors.    Emerson Monte, FNP 11/16/19 1009

## 2019-11-16 NOTE — ED Triage Notes (Addendum)
Nasal congestion, sore throat and fatigue x 2 weeks.  Pt is on abx and has 2 days left d/t his psa level being up.

## 2019-11-22 ENCOUNTER — Telehealth (INDEPENDENT_AMBULATORY_CARE_PROVIDER_SITE_OTHER): Payer: Self-pay

## 2019-11-22 NOTE — Telephone Encounter (Signed)
Called and notified patient. He will see you on 8/18 for f/u & psa lab.

## 2019-11-22 NOTE — Telephone Encounter (Signed)
He should have finished the antibiotic, ciprofloxacin by now.  We will repeat the PSA test when I see him in August.

## 2019-12-27 ENCOUNTER — Encounter (INDEPENDENT_AMBULATORY_CARE_PROVIDER_SITE_OTHER): Payer: Self-pay | Admitting: Internal Medicine

## 2019-12-27 ENCOUNTER — Other Ambulatory Visit: Payer: Self-pay

## 2019-12-27 ENCOUNTER — Ambulatory Visit (INDEPENDENT_AMBULATORY_CARE_PROVIDER_SITE_OTHER): Payer: Medicare Other | Admitting: Internal Medicine

## 2019-12-27 VITALS — BP 133/71 | HR 83 | Temp 97.6°F | Resp 18 | Ht 74.0 in | Wt 284.0 lb

## 2019-12-27 DIAGNOSIS — R972 Elevated prostate specific antigen [PSA]: Secondary | ICD-10-CM

## 2019-12-27 DIAGNOSIS — E669 Obesity, unspecified: Secondary | ICD-10-CM | POA: Diagnosis not present

## 2019-12-27 DIAGNOSIS — E559 Vitamin D deficiency, unspecified: Secondary | ICD-10-CM | POA: Diagnosis not present

## 2019-12-27 NOTE — Progress Notes (Signed)
Metrics: Intervention Frequency ACO  Documented Smoking Status Yearly  Screened one or more times in 24 months  Cessation Counseling or  Active cessation medication Past 24 months  Past 24 months   Guideline developer: UpToDate (See UpToDate for funding source) Date Released: 2014       Wellness Office Visit  Subjective:  [name], male    DOB: [birth year]  Age: 64 y.o. MRN: [id number]  CC: This man comes in for follow-up of obesity, elevated PSA and vitamin D deficiency as well as hypertension. HPI  He is doing reasonably well and he continues to do intermittent fasting on a daily basis at least 16 hours.  He tries to eat healthier but we need to find him this. He has lost weight. On the last visit, he had an elevated PSA and I put him empirically on antibiotics with ciprofloxacin in case he had prostatitis. He has been taking vitamin D3 10,000 units daily. Past Medical History:  Diagnosis Date  . Atrial fibrillation ([place])    Diagnosed 09/2009, on flecainide  . Bradycardia    Event monitor 2010: HR down to 45, asymptomatic.  [name] Coronary atherosclerosis of native coronary artery    Nonobstructive and distal disease by cath 2005.   [name] Essential hypertension   . Hyperlipidemia   . OSA (obstructive sleep apnea)   . Renal cyst   . Ringing in ears   . Sleep apnea    Past Surgical History:  Procedure Laterality Date  . BIOPSY  01/04/2018   Procedure: BIOPSY;  Surgeon: [name], MD;  [facility];  Service: Endoscopy;;  ascending colon   . CARDIAC CATHETERIZATION  2005  . COLONOSCOPY  05/2006   SLF: 3 mm sigmoid: Tubular adenoma removed.  . COLONOSCOPY N/A 05/06/2016   Dr. [name]: two sessile polyps in mid transverse colon and hepatic flexure (tubular adenomas). ext/int hemorrhoids. tortuous left colon.   . COLONOSCOPY WITH PROPOFOL N/A 01/04/2018   Procedure: COLONOSCOPY WITH PROPOFOL;  Surgeon: [name], MD;  [facility];   Service: Endoscopy;  Laterality: N/A;  2:30pm  . KNEE ARTHROSCOPY    . Left anterior cruciate ligament reconstruction    . left shoulder surgery     dr . [name]   . POLYPECTOMY  05/06/2016   Procedure: POLYPECTOMY;  Surgeon: [name], MD;  [facility];  Service: Endoscopy;;  transverse colon polyp, hepatic flexure polyp,   . Right shoulder surgery    . ROBOT ASSISTED LAPAROSCOPIC NEPHRECTOMY Left 07/04/2018   Procedure: XI ROBOTIC ASSISTED LAPAROSCOPIC RENAL CYST DECORTICATION;  Surgeon: [name], MD;  Location[facility];  Service: Urology;  Laterality: Left;  3 HRSPROCEDURE: ROBOTIC RENAL CYST DECORTICATION  . TONSILLECTOMY    . TOTAL KNEE ARTHROPLASTY       Family History  Problem Relation Age of Onset  . Pulmonary embolism Mother   . Lung disease Father   . Hyperlipidemia Brother   . Hypertension Brother   . Diabetes Other        Significant family h/o DM  . Arthritis Sister   . 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   Social History   Tobacco Use  . Smoking status: Never Smoker  . Smokeless tobacco: Never Used  Substance Use Topics  . Alcohol use: No  Alcohol/week: 0.0 standard drinks    Current Meds  Medication Sig  . amLODipine (NORVASC) 10 MG tablet Take 1 tablet (10 mg total) by mouth daily.  . benzonatate (TESSALON) 100 MG capsule Take 1 capsule (100 mg total) by mouth every 8 (eight) hours.  . cetirizine (ZYRTEC) 5 MG chewable tablet Chew 1 tablet (5 mg total) by mouth daily.  . Cholecalciferol (VITAMIN D-3) 125 MCG (5000 UT) TABS Take 2 tablets by mouth daily.  . flecainide (TAMBOCOR) 50 MG tablet Take 1 tablet (50 mg total) by mouth 2 (two) times daily.  . furosemide (LASIX) 40 MG tablet Take 40 mg by mouth daily as needed for fluid.   [name] losartan (COZAAR) 100 MG tablet Take 1 tablet  (100 mg total) by mouth daily.  [name] omeprazole (PRILOSEC) 20 MG capsule TAKE 1 CAPSULE BY MOUTH  DAILY  . pravastatin (PRAVACHOL) 40 MG tablet Take 40 mg by mouth at bedtime.   . tamsulosin (FLOMAX) 0.4 MG CAPS capsule TAKE 1 CAPSULE BY MOUTH EVERY DAY  . [DISCONTINUED] ciprofloxacin (CIPRO) 500 MG tablet Take 1 tablet (500 mg total) by mouth 2 (two) times daily.       Depression screen [facility] 2/9 04/17/2019 04/17/2019 08/01/2014  Decreased Interest 0 0 0  Down, Depressed, Hopeless 0 0 0  PHQ - 2 Score 0 0 0     Objective:   Today's Vitals: BP 133/71 (BP Location: Right Arm, Patient Position: Sitting, Cuff Size: Normal)   Pulse 83   Temp 97.6 F (36.4 C) (Temporal)   Resp 18   Ht 6\' 2"  (1.88 m)   Wt 284 lb (128.8 kg)   SpO2 98%   BMI 36.46 kg/m  Vitals with BMI 12/27/2019 11/16/2019 11/08/2019  Height 6\' 2"  6\' 3"  6\' 3"   Weight 284 lbs 280 lbs 288 lbs  BMI 40.10 35 36  Systolic 272 536 644  Diastolic 71 80 75  Pulse 83 58 61     Physical Exam  He looks systemically well.  Weight is mostly the same although he has lost 4 pounds since the last time I saw him.  Blood pressure is well controlled.     Assessment   1. Elevated PSA   2. Vitamin D deficiency disease   3. Obesity (BMI 30-39.9)       Tests ordered Orders Placed This Encounter  Procedures  . PSA, Total with Reflex to PSA, Free  . VITAMIN D 25 Hydroxy (Vit-D Deficiency, Fractures)  . COMPLETE METABOLIC PANEL WITH GFR     Plan: 1. We will check another PSA with percentage free PSA.  He is due to see urology in October. 2. He will continue with vitamin D3 10,000 units daily and I will check vitamin D levels. 3. As far as his obesity is concerned, today we discussed more the blue zones and a plant-based diet in more detail. 4. Follow-up in 3 months. 5. Today I spent at least 30 minutes addressing the patient's conditions above.   No orders of the defined types were placed in this encounter.   Doree Albee, MD

## 2019-12-28 LAB — COMPLETE METABOLIC PANEL WITH GFR
AG Ratio: 1.6 (calc) (ref 1.0–2.5)
ALT: 15 U/L (ref 9–46)
AST: 14 U/L (ref 10–35)
Albumin: 4.2 g/dL (ref 3.6–5.1)
Alkaline phosphatase (APISO): 78 U/L (ref 35–144)
BUN: 12 mg/dL (ref 7–25)
CO2: 29 mmol/L (ref 20–32)
Calcium: 9.2 mg/dL (ref 8.6–10.3)
Chloride: 104 mmol/L (ref 98–110)
Creat: 1.23 mg/dL (ref 0.70–1.25)
GFR, Est African American: 70 mL/min/{1.73_m2} (ref >=60)
GFR, Est Non African American: 61 mL/min/{1.73_m2} (ref >=60)
Globulin: 2.6 g/dL (calc) (ref 1.9–3.7)
Glucose, Bld: 88 mg/dL (ref 65–99)
Potassium: 4.2 mmol/L (ref 3.5–5.3)
Sodium: 140 mmol/L (ref 135–146)
Total Bilirubin: 0.8 mg/dL (ref 0.2–1.2)
Total Protein: 6.8 g/dL (ref 6.1–8.1)

## 2019-12-28 LAB — VITAMIN D 25 HYDROXY (VIT D DEFICIENCY, FRACTURES): Vit D, 25-Hydroxy: 34 ng/mL (ref 30–100)

## 2019-12-28 LAB — REFLEX PSA, FREE
PSA, % Free: 19 % (calc) — ABNORMAL LOW (ref >25)
PSA, Free: 1.3 ng/mL

## 2019-12-28 LAB — PSA, TOTAL WITH REFLEX TO PSA, FREE: PSA, Total: 6.7 ng/mL — ABNORMAL HIGH (ref <=4.0)

## 2020-01-01 NOTE — Progress Notes (Signed)
Patient called. Talked to Mr Jonathan Dyer and given lab result. Pt will keep Urology appt and see [place] back in Nov 17@11am .

## 2020-01-01 NOTE — Progress Notes (Signed)
Please call this patient.  Tell him that the PSA is coming down and he has had about a 20% chance of having cancer with this PSA.  I think he is already seeing urologist in October so please confirm that with him.Follow-up as scheduled.

## 2020-01-24 ENCOUNTER — Telehealth: Payer: Self-pay | Admitting: Internal Medicine

## 2020-01-24 DIAGNOSIS — K862 Cyst of pancreas: Secondary | ICD-10-CM

## 2020-01-24 NOTE — Telephone Encounter (Signed)
We have not seen the patient in nearly 2 years. Please make ov to address abdominal pain.

## 2020-01-24 NOTE — Telephone Encounter (Signed)
Pt said he was due to have another MRI in OCT and is requesting it be done on a Mon or Tues.. He also said he was starting to have abd pain off and on. Please advise. [phone]

## 2020-01-24 NOTE — Telephone Encounter (Signed)
MRI scheduled for 10/19 at 10:00am, arrival 9:30am, npo 4 hrs prior.  Called pt and he is aware of appt details. He voiced understanding. He also reports he has started having somce nagging abd pain x Monday under left side rib cage, comes/goes. He can't think of anything he has done to cause. Denies any nausea, no vomiting, no diarrhea, no constipation, no fever, no chills, sweats.

## 2020-01-25 DIAGNOSIS — M1812 Unilateral primary osteoarthritis of first carpometacarpal joint, left hand: Secondary | ICD-10-CM | POA: Diagnosis not present

## 2020-01-25 NOTE — Telephone Encounter (Signed)
Called pt and made aware. He states he will monitor and call for appt if needed

## 2020-02-01 DIAGNOSIS — M1712 Unilateral primary osteoarthritis, left knee: Secondary | ICD-10-CM | POA: Diagnosis not present

## 2020-02-01 DIAGNOSIS — M25561 Pain in right knee: Secondary | ICD-10-CM | POA: Diagnosis not present

## 2020-02-01 DIAGNOSIS — M1711 Unilateral primary osteoarthritis, right knee: Secondary | ICD-10-CM | POA: Diagnosis not present

## 2020-02-01 DIAGNOSIS — M170 Bilateral primary osteoarthritis of knee: Secondary | ICD-10-CM | POA: Diagnosis not present

## 2020-02-02 ENCOUNTER — Ambulatory Visit: Payer: Medicare Other | Admitting: Cardiology

## 2020-02-05 DIAGNOSIS — M1812 Unilateral primary osteoarthritis of first carpometacarpal joint, left hand: Secondary | ICD-10-CM | POA: Insufficient documentation

## 2020-02-06 DIAGNOSIS — M13842 Other specified arthritis, left hand: Secondary | ICD-10-CM | POA: Diagnosis not present

## 2020-02-19 ENCOUNTER — Telehealth: Payer: Self-pay | Admitting: Internal Medicine

## 2020-02-19 NOTE — Telephone Encounter (Signed)
Pt already scheduled for MRI ABD

## 2020-02-19 NOTE — Telephone Encounter (Signed)
Recall for mri abdomen 

## 2020-02-19 NOTE — Progress Notes (Signed)
Cardiology Office Note    Date:  02/20/2020   ID:  Jonathan Dyer, DOB [birth year], MRN [id number]  PCP:  Doree Albee, MD  Cardiologist: Rozann Lesches, MD    Chief Complaint  Patient presents with  . Follow-up    6 month visit    History of Present Illness:    Jonathan Dyer is a 64 y.o. male with past medical history of CAD (nonobstructive disease by catheterization in 2005, low-risk GXT in 12/2018), paroxysmal atrial fibrillation, HTN and OSA who presents for a 69-month follow-up visit.  He most recently had a telehealth visit with Dr. Domenic Polite in 07/2019 and reported issues with right knee pain and was being followed by Orthopedics. He had recently experienced chest discomfort following a steroid injection to his knee but denied any recurrent symptoms. His symptoms were felt to be atypical for angina but he was brought to the office for an EKG which showed no acute ST changes.  He did call the office in 08/2019 due to elevated BP at home and was started on Chlorthalidone 12.5 mg daily.  In talking with the patient today, he reports having intermittent episodes of a sternal aching sensation over the past few weeks which would occur at rest and improved with exercise. He took Omeprazole this past weekend and symptoms resolved. He biked over 16 miles on his stationary bike yesterday without symptoms and feels back to baseline. Says he prefers not to take Omeprazole regularly and only takes this as needed.  Says his breathing has been at baseline denies any orthopnea, PND or lower extremity edema. He has lost over 20 pounds within the past few months due to intermittent fasting and increased exercise.   Past Medical History:  Diagnosis Date  . Atrial fibrillation ([place])    Diagnosed 09/2009, on flecainide  . Bradycardia    Event monitor 2010: HR down to 45, asymptomatic.  [name] Coronary atherosclerosis of native coronary artery    Nonobstructive and distal disease by  cath 2005.   [name] Essential hypertension   . Hyperlipidemia   . OSA (obstructive sleep apnea)   . Renal cyst   . Ringing in ears   . Sleep apnea     Past Surgical History:  Procedure Laterality Date  . BIOPSY  01/04/2018   Procedure: BIOPSY;  Surgeon: [name], MD;  Location: [facility];  Service: Endoscopy;;  ascending colon   . CARDIAC CATHETERIZATION  2005  . COLONOSCOPY  05/2006   SL[facility]: 3 mm sigmoid: Tubular adenoma removed.  . COLONOSCOPY N/A 05/06/2016   Dr. [name]: two sessile polyps in mid transverse colon and hepatic flexure (tubular adenomas). ext/int hemorrhoids. tortuous left colon.   . COLONOSCOPY WITH PROPOFOL N/A 01/04/2018   Procedure: COLONOSCOPY WITH PROPOFOL;  Surgeon: [name], MD;  Location: [facility];  Service: Endoscopy;  Laterality: N/A;  2:30pm  . KNEE ARTHROSCOPY    . Left anterior cruciate ligament reconstruction    . left shoulder surgery     dr . [name]   . POLYPECTOMY  05/06/2016   Procedure: POLYPECTOMY;  Surgeon: [name], MD;  Location: [facility];  Service: Endoscopy;;  transverse colon polyp, hepatic flexure polyp,   . Right shoulder surgery    . ROBOT ASSISTED LAPAROSCOPIC NEPHRECTOMY Left 07/04/2018   Procedure: XI ROBOTIC ASSISTED LAPAROSCOPIC RENAL CYST DECORTICATION;  Surgeon: [name], MD;  Location: [facility];  Service: Urology;  Laterality: Left;  3 HRSPROCEDURE:  ROBOTIC RENAL CYST DECORTICATION  . TONSILLECTOMY    . TOTAL KNEE ARTHROPLASTY      Current Medications: Outpatient Medications Prior to Visit  Medication Sig Dispense Refill  . amLODipine (NORVASC) 10 MG tablet Take 1 tablet (10 mg total) by mouth daily. 90 tablet 3  . Cholecalciferol (VITAMIN D-3) 125 MCG (5000 UT) TABS Take 2 tablets by mouth daily.    . flecainide (TAMBOCOR) 50 MG tablet Take 1 tablet (50 mg total) by mouth 2 (two) times daily. 180 tablet 3  . furosemide (LASIX) 40 MG tablet Take 40 mg by mouth daily as needed for  fluid.     [name] losartan (COZAAR) 100 MG tablet Take 1 tablet (100 mg total) by mouth daily. 90 tablet 3  . omeprazole (PRILOSEC) 20 MG capsule TAKE 1 CAPSULE BY MOUTH  DAILY 90 capsule 3  . potassium chloride (KLOR-CON) 10 MEQ tablet Take 1 tablet (10 mEq total) by mouth daily. 90 tablet 3  . pravastatin (PRAVACHOL) 40 MG tablet Take 40 mg by mouth as needed.     . tamsulosin (FLOMAX) 0.4 MG CAPS capsule TAKE 1 CAPSULE BY MOUTH EVERY DAY (Patient taking differently: 0.4 mg 3 (three) times a week. ) 30 capsule 0  . benzonatate (TESSALON) 100 MG capsule Take 1 capsule (100 mg total) by mouth every 8 (eight) hours. 30 capsule 0  . cetirizine (ZYRTEC) 5 MG chewable tablet Chew 1 tablet (5 mg total) by mouth daily. 30 tablet 0  . chlorthalidone (HYGROTON) 25 MG tablet Take 0.5 tablets (12.5 mg total) by mouth daily. 45 tablet 3  . fluticasone (FLONASE) 50 MCG/ACT nasal spray Place 1 spray into both nostrils daily for 14 days. 16 g 0   No facility-administered medications prior to visit.     Allergies:   Clindamycin/lincomycin and Penicillins   Social History   Socioeconomic History  . Marital status: Divorced    Spouse name: Not on file  . Number of children: 3  . Years of education: Not on file  . Highest education level: Not on file  Occupational History  . Occupation: PT-custodial work  Tobacco Use  . Smoking status: Never Smoker  . Smokeless tobacco: Never Used  Vaping Use  . Vaping Use: Never used  Substance and Sexual Activity  . Alcohol use: No    Alcohol/week: 0.0 standard drinks  . Drug use: No  . Sexual activity: Not Currently  Other Topics Concern  . 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   Social Determinants of Health   Financial Resource Strain:   . Difficulty of Paying Living Expenses: Not on file  Food Insecurity:   . Worried About Charity fundraiser in  the Last Year: Not on file  . Ran Out of Food in the Last Year: Not on file  Transportation Needs:   . Lack of Transportation (Medical): Not on file  . Lack of Transportation (Non-Medical): Not on file  Physical Activity:   . Days of Exercise per Week: Not on file  . Minutes of Exercise per Session: Not on file  Stress:   . Feeling of Stress : Not on file  Social Connections:   . Frequency of Communication with Friends and Family: Not on file  . Frequency of Social Gatherings with Friends and Family: Not on file  . Attends Religious Services: Not on file  . Active Member of Clubs or  Organizations: Not on file  . Attends Archivist Meetings: Not on file  . Marital Status: Not on file     Family History:  The patient's family history includes Arthritis in his sister; Diabetes in an other family member; Hyperlipidemia in his brother; Hypertension in his brother; Lung disease in his father; Pulmonary embolism in his mother.   Review of Systems:   Please see the history of present illness.     General:  No chills, fever, night sweats or weight changes.  Cardiovascular:  No dyspnea on exertion, edema, orthopnea, palpitations, paroxysmal nocturnal dyspnea. Positive for chest pain (resolved).  Dermatological: No rash, lesions/masses Respiratory: No cough, dyspnea Urologic: No hematuria, dysuria Abdominal:   No nausea, vomiting, diarrhea, bright red blood per rectum, melena, or hematemesis Neurologic:  No visual changes, wkns, changes in mental status. All other systems reviewed and are otherwise negative except as noted above.   Physical Exam:    VS:  BP 112/72   Pulse (!) 56   Ht 6\' 2"  (1.88 m)   Wt 276 lb (125.2 kg)   SpO2 96%   BMI 35.44 kg/m    General: Well developed, well nourished,male appearing in no acute distress. Head: Normocephalic, atraumatic. Neck: No carotid bruits. JVD not elevated.  Lungs: Respirations regular and unlabored, without wheezes or rales.    Heart: Regular rate and rhythm. No S3 or S4.  No murmur, no rubs, or gallops appreciated. Abdomen: Appears non-distended. No obvious abdominal masses. Msk:  Strength and tone appear normal for age. No obvious joint deformities or effusions. Extremities: No clubbing or cyanosis. No lower extremity edema.  Distal pedal pulses are 2+ bilaterally. Neuro: Alert and oriented X 3. Moves all extremities spontaneously. No focal deficits noted. Psych:  Responds to questions appropriately with a normal affect. Skin: No rashes or lesions noted  Wt Readings from Last 3 Encounters:  02/20/20 276 lb (125.2 kg)  12/27/19 284 lb (128.8 kg)  11/16/19 280 lb (127 kg)      Studies/Labs Reviewed:   EKG:  EKG is ordered today.  The ekg ordered today demonstrates NSR,HR 60 with no acute ST changes when compared to prior tracings.   Recent Labs: 10/03/2019: TSH 1.43 12/27/2019: ALT 15; BUN 12; Creat 1.23; Potassium 4.2; Sodium 140   Lipid Panel    Component Value Date/Time   CHOL 207 (H) 10/03/2019 0926   TRIG 208 (H) 10/03/2019 0926   HDL 41 10/03/2019 0926   CHOLHDL 5.0 (H) 10/03/2019 0926   LDLCALC 131 (H) 10/03/2019 0926    Additional studies/ records that were reviewed today include:   Echocardiogram: 11/2016 Study Conclusions   - Left ventricle: The cavity size was normal. Wall thickness was  increased in a pattern of mild LVH. Systolic function was normal.  The estimated ejection fraction was in the range of 55% to 60%.  Wall motion was normal; there were no regional wall motion  abnormalities. Left ventricular diastolic function parameters  were normal.  - Aortic valve: Mildly calcified annulus. Trileaflet; mildly  thickened leaflets. Valve area (VTI): 3.42 cm^2. Valve area  (Vmax): 3.19 cm^2. Valve area (Vmean): 2.93 cm^2.  - Mitral valve: Mildly calcified annulus. Mildly thickened leaflets  .  - Technically adequate study.  ETT: 12/2018  Blood pressure  demonstrated a hypertensive response to exercise.  There was no ST segment deviation noted during stress.  [facility] treadmill score portends a low risk of cardiac events.   Assessment:    1. Atypical  chest pain   2. Coronary artery disease involving native coronary artery of native heart without angina pectoris   3. Paroxysmal atrial fibrillation (HCC)   4. Essential hypertension      Plan:   In order of problems listed above:  1. Atypical Chest Pain - He has experienced episodes of sternal pain over the past few weeks which would occur at rest and improve with activity. Symptoms resolved with Omeprazole and he denies any recurrent symptoms since. He has been exercising and biked over 16 miles yesterday without any chest pain or dyspnea. EKG today is without acute ST changes. Reviewed warning signs with the patient today and given no exertional symptoms at this time and recent reassuring GXT, will continue with observation. We did review that if symptoms represented despite the use of Omeprazole or changed in frequency or severity, could plan for a repeat stress test with images or a Coronary CT.  2. CAD - He had nonobstructive CAD by catheterization in 2005 and his most recent ischemic evaluation was a low-risk GXT in 12/2018 as outlined above. Continue with risk factor modification. He has lost over 20 pounds within the past few months. - Continue ASA 81 mg daily. He has an Rx for Pravastatin 40 mg daily but reports not taking this regularly. He was encouraged to follow-up with routine labs with his PCP and if LDL remains above goal, would recommend reinitiating this as LDL was at 137 in 10/2018.  3. Paroxysmal Atrial Fibrillation - He does experience intermittent palpitations but denies any persistent symptoms. Remains on Flecainide 50 mg twice daily. He has declined anticoagulation by review of notes and remains on ASA 81mg  daily.   4. HTN - BP is well controlled at 120/72 during  today's visit. Continue current medication regimen with Amlodipine 10 mg daily and Losartan 100 mg daily.   Medication Adjustments/Labs and Tests Ordered: Current medicines are reviewed at length with the patient today.  Concerns regarding medicines are outlined above.  Medication changes, Labs and Tests ordered today are listed in the Patient Instructions below. Patient Instructions  Medication Instructions:  Your physician recommends that you continue on your current medications as directed. Please refer to the Current Medication list given to you today.  *If you need a refill on your cardiac medications before your next appointment, please call your pharmacy*   Lab Work: NONE   If you have labs (blood work) drawn today and your tests are completely normal, you will receive your results only by: [name] MyChart Message (if you have MyChart) OR . A paper copy in the mail If you have any lab test that is abnormal or we need to change your treatment, we will call you to review the results.   Testing/Procedures: NONE    Follow-Up: At [facility], you and your health needs are our priority.  As part of our continuing mission to provide you with exceptional heart care, we have created designated Provider Care Teams.  These Care Teams include your primary Cardiologist (physician) and Advanced Practice Providers (APPs -  Physician Assistants and Nurse Practitioners) who all work together to provide you with the care you need, when you need it.  We recommend signing up for the patient portal called "MyChart".  Sign up information is provided on this After Visit Summary.  MyChart is used to connect with patients for Virtual Visits (Telemedicine).  Patients are able to view lab/test results, encounter notes, upcoming appointments, etc.  Non-urgent messages can be sent  to your provider as well.   To learn more about what you can do with MyChart, go to [email or web address].    Your next  appointment:   6 month(s)  The format for your next appointment:   In Person  Provider:   Rozann Lesches, MD   Other Instructions Thank you for choosing [facility]!    Signed, Erma Heritage, PA-C  02/20/2020 5:19 PM    [facility] [address] [place], [place] [address] Phone: [phone] Fax: [phone]

## 2020-02-19 NOTE — Telephone Encounter (Signed)
duplicate

## 2020-02-19 NOTE — Telephone Encounter (Signed)
Recall for mri abdomen

## 2020-02-20 ENCOUNTER — Ambulatory Visit (INDEPENDENT_AMBULATORY_CARE_PROVIDER_SITE_OTHER): Payer: Medicare Other | Admitting: Student

## 2020-02-20 ENCOUNTER — Other Ambulatory Visit: Payer: Self-pay

## 2020-02-20 ENCOUNTER — Encounter: Payer: Self-pay | Admitting: Student

## 2020-02-20 VITALS — BP 112/72 | HR 56 | Ht 74.0 in | Wt 276.0 lb

## 2020-02-20 DIAGNOSIS — R0789 Other chest pain: Secondary | ICD-10-CM | POA: Diagnosis not present

## 2020-02-20 DIAGNOSIS — L820 Inflamed seborrheic keratosis: Secondary | ICD-10-CM | POA: Diagnosis not present

## 2020-02-20 DIAGNOSIS — I480 Paroxysmal atrial fibrillation: Secondary | ICD-10-CM | POA: Diagnosis not present

## 2020-02-20 DIAGNOSIS — L814 Other melanin hyperpigmentation: Secondary | ICD-10-CM | POA: Diagnosis not present

## 2020-02-20 DIAGNOSIS — I2510 Atherosclerotic heart disease of native coronary artery without angina pectoris: Secondary | ICD-10-CM | POA: Diagnosis not present

## 2020-02-20 DIAGNOSIS — I10 Essential (primary) hypertension: Secondary | ICD-10-CM | POA: Diagnosis not present

## 2020-02-20 DIAGNOSIS — D1801 Hemangioma of skin and subcutaneous tissue: Secondary | ICD-10-CM | POA: Diagnosis not present

## 2020-02-20 DIAGNOSIS — Z85828 Personal history of other malignant neoplasm of skin: Secondary | ICD-10-CM | POA: Diagnosis not present

## 2020-02-20 DIAGNOSIS — L570 Actinic keratosis: Secondary | ICD-10-CM | POA: Diagnosis not present

## 2020-02-20 DIAGNOSIS — L918 Other hypertrophic disorders of the skin: Secondary | ICD-10-CM | POA: Diagnosis not present

## 2020-02-20 DIAGNOSIS — D225 Melanocytic nevi of trunk: Secondary | ICD-10-CM | POA: Diagnosis not present

## 2020-02-20 DIAGNOSIS — L821 Other seborrheic keratosis: Secondary | ICD-10-CM | POA: Diagnosis not present

## 2020-02-20 NOTE — Patient Instructions (Signed)
Medication Instructions:  Your physician recommends that you continue on your current medications as directed. Please refer to the Current Medication list given to you today.  *If you need a refill on your cardiac medications before your next appointment, please call your pharmacy*   Lab Work: NONE   If you have labs (blood work) drawn today and your tests are completely normal, you will receive your results only by: . MyChart Message (if you have MyChart) OR . A paper copy in the mail If you have any lab test that is abnormal or we need to change your treatment, we will call you to review the results.   Testing/Procedures: NONE    Follow-Up: At [facility], you and your health needs are our priority.  As part of our continuing mission to provide you with exceptional heart care, we have created designated Provider Care Teams.  These Care Teams include your primary Cardiologist (physician) and Advanced Practice Providers (APPs -  Physician Assistants and Nurse Practitioners) who all work together to provide you with the care you need, when you need it.  We recommend signing up for the patient portal called "MyChart".  Sign up information is provided on this After Visit Summary.  MyChart is used to connect with patients for Virtual Visits (Telemedicine).  Patients are able to view lab/test results, encounter notes, upcoming appointments, etc.  Non-urgent messages can be sent to your provider as well.   To learn more about what you can do with MyChart, go to [email or web address].    Your next appointment:   6 month(s)  The format for your next appointment:   In Person  Provider:   Samuel McDowell, MD   Other Instructions Thank you for choosing [facility]!    

## 2020-02-27 ENCOUNTER — Other Ambulatory Visit: Payer: Self-pay | Admitting: Gastroenterology

## 2020-02-27 ENCOUNTER — Other Ambulatory Visit: Payer: Self-pay

## 2020-02-27 ENCOUNTER — Ambulatory Visit (HOSPITAL_COMMUNITY)
Admission: RE | Admit: 2020-02-27 | Discharge: 2020-02-27 | Disposition: A | Discharge status: Home / Self Care | Payer: Medicare Other | Source: Ambulatory Visit | Attending: Gastroenterology | Admitting: Gastroenterology

## 2020-02-27 DIAGNOSIS — K862 Cyst of pancreas: Secondary | ICD-10-CM | POA: Diagnosis not present

## 2020-02-27 MED ORDER — GADOBUTROL 1 MMOL/ML IV SOLN
10.0000 mL | Freq: Once | INTRAVENOUS | Status: AC | PRN
  Administered 2020-02-27: 10 mL via INTRAVENOUS

## 2020-03-11 DIAGNOSIS — M1711 Unilateral primary osteoarthritis, right knee: Secondary | ICD-10-CM | POA: Diagnosis not present

## 2020-03-12 DIAGNOSIS — N401 Enlarged prostate with lower urinary tract symptoms: Secondary | ICD-10-CM | POA: Diagnosis not present

## 2020-03-12 DIAGNOSIS — R3912 Poor urinary stream: Secondary | ICD-10-CM | POA: Diagnosis not present

## 2020-03-12 DIAGNOSIS — N281 Cyst of kidney, acquired: Secondary | ICD-10-CM | POA: Diagnosis not present

## 2020-03-20 ENCOUNTER — Encounter: Payer: Self-pay | Admitting: Internal Medicine

## 2020-03-20 ENCOUNTER — Other Ambulatory Visit: Payer: Self-pay

## 2020-03-20 ENCOUNTER — Ambulatory Visit (INDEPENDENT_AMBULATORY_CARE_PROVIDER_SITE_OTHER): Payer: Medicare Other | Admitting: Internal Medicine

## 2020-03-20 VITALS — BP 126/76 | HR 55 | Temp 97.7°F | Ht 74.0 in | Wt 281.6 lb

## 2020-03-20 DIAGNOSIS — K862 Cyst of pancreas: Secondary | ICD-10-CM

## 2020-03-20 DIAGNOSIS — R1013 Epigastric pain: Secondary | ICD-10-CM | POA: Diagnosis not present

## 2020-03-20 DIAGNOSIS — K219 Gastro-esophageal reflux disease without esophagitis: Secondary | ICD-10-CM | POA: Diagnosis not present

## 2020-03-20 NOTE — Patient Instructions (Signed)
The pancreatic cysts appear stable on most recent MRI.  We will plan on repeat MRI in 1 year.  At that time we will likely schedule you for EGD and colonoscopy which we will discuss on follow-up visit.  Follow-up with GI 1 year or sooner if needed.  At [facility] we value your feedback. You may receive a survey about your visit today. Please share your experience as we strive to create trusting relationships with our patients to provide genuine, compassionate, quality care.  We appreciate your understanding and patience as we review any laboratory studies, imaging, and other diagnostic tests that are ordered as we care for you. Our office policy is 5 business days for review of these results, and any emergent or urgent results are addressed in a timely manner for your best interest. If you do not hear from our office in 1 week, please contact us.   We also encourage the use of MyChart, which contains your medical information for your review as well. If you are not enrolled in this feature, an access code is on this after visit summary for your convenience. Thank you for allowing [place] to be involved in your care.  It was great to see you today!  I hope you have a great rest of your fall!!    Jaxston Chohan K. Abbey Chatters, D.O. Gastroenterology and Hepatology [facility]

## 2020-03-20 NOTE — Progress Notes (Signed)
Referring Provider: Doree Albee, MD Primary Care Physician:  Doree Albee, MD Primary GI:  Dr. Abbey Chatters  Chief Complaint  Patient presents with  . pancreatic lesion    f/u. MRI done 02/2020    HPI:   Jonathan Dyer is a 64 y.o. male who presents to the clinic today for follow-up visit.  He has a history of pancreatic cysts and receives yearly MRIs.  Most recent MRI on 02/27/2020 showed stable appearing 9 mm cystic lesion in the pancreatic head neck junction region. Smaller 5.5 mm cyst in the pancreatic tail. No worrisome MR imaging findings. Recommend follow-up MRI in 1 year. Patient denies any history of pancreatitis in the past.  No diarrhea or oily stools.  No previous EUS.  Does note chronic reflux which is intermittent and well controlled on as needed omeprazole.  States that primarily occurs when he eats tomato-based foods or spicy foods.  No dysphagia or odynophagia.  No chronic NSAID use.  Does have a history of adenomatous colon polyps with current recall of 2022.  He had an episode of colitis back in 2018 in the ascending colon.  Biopsies consistent with ischemia.  He was hiking in the [place] at the time and it is believed that he became dehydrated.  He was on NSAIDs at the same time as well.  He has had no further symptoms after that initial episode.    Past Medical History:  Diagnosis Date  . Atrial fibrillation ([place])    Diagnosed 09/2009, on flecainide  . Bradycardia    Event monitor 2010: HR down to 45, asymptomatic.  [name] Coronary atherosclerosis of native coronary artery    Nonobstructive and distal disease by cath 2005.   [name] Essential hypertension   . Hyperlipidemia   . OSA (obstructive sleep apnea)   . Renal cyst   . Ringing in ears   . Sleep apnea     Past Surgical History:  Procedure Laterality Date  . BIOPSY  01/04/2018   Procedure: BIOPSY;  Surgeon: [name], MD;  [facility];  Service: Endoscopy;;   ascending colon   . CARDIAC CATHETERIZATION  2005  . COLONOSCOPY  05/2006   SLF: 3 mm sigmoid: Tubular adenoma removed.  . COLONOSCOPY N/A 05/06/2016   Dr. [name]: two sessile polyps in mid transverse colon and hepatic flexure (tubular adenomas). ext/int hemorrhoids. tortuous left colon.   . COLONOSCOPY WITH PROPOFOL N/A 01/04/2018   Procedure: COLONOSCOPY WITH PROPOFOL;  Surgeon: [name], MD;  [facility];  Service: Endoscopy;  Laterality: N/A;  2:30pm  . KNEE ARTHROSCOPY    . Left anterior cruciate ligament reconstruction    . left shoulder surgery     dr . [name]   . POLYPECTOMY  05/06/2016   Procedure: POLYPECTOMY;  Surgeon: [name], MD;  [facility];  Service: Endoscopy;;  transverse colon polyp, hepatic flexure polyp,   . Right shoulder surgery    . ROBOT ASSISTED LAPAROSCOPIC NEPHRECTOMY Left 07/04/2018   Procedure: XI ROBOTIC ASSISTED LAPAROSCOPIC RENAL CYST DECORTICATION;  Surgeon: [name], MD;  [facility];  Service: Urology;  Laterality: Left;  3 HRSPROCEDURE: ROBOTIC RENAL CYST DECORTICATION  . TONSILLECTOMY    . TOTAL KNEE ARTHROPLASTY      Current Outpatient Medications  Medication Sig Dispense Refill  . amLODipine (NORVASC) 10 MG tablet Take 1 tablet (10 mg total) by mouth daily. 90 tablet 3  .  Cholecalciferol (VITAMIN D-3) 125 MCG (5000 UT) TABS Take 2 tablets by mouth daily.    . flecainide (TAMBOCOR) 50 MG tablet Take 1 tablet (50 mg total) by mouth 2 (two) times daily. 180 tablet 3  . furosemide (LASIX) 40 MG tablet Take 40 mg by mouth daily as needed for fluid.     [name] losartan (COZAAR) 100 MG tablet Take 1 tablet (100 mg total) by mouth daily. 90 tablet 3  . omeprazole (PRILOSEC) 20 MG capsule TAKE 1 CAPSULE BY MOUTH  DAILY 90 capsule 3  . potassium chloride (KLOR-CON) 10 MEQ tablet Take 1 tablet (10 mEq total) by mouth daily. 90 tablet 3  . pravastatin (PRAVACHOL) 40 MG tablet Take 40 mg by mouth as needed.      . tamsulosin (FLOMAX) 0.4 MG CAPS capsule TAKE 1 CAPSULE BY MOUTH EVERY DAY (Patient taking differently: 0.4 mg 3 (three) times a week. ) 30 capsule 0   No current facility-administered medications for this visit.    Allergies as of 03/20/2020 - Review Complete 03/20/2020  Allergen Reaction Noted  . Clindamycin/lincomycin Swelling and Other (See Comments) 01/03/2018  . Penicillins Other (See Comments)     Family History  Problem Relation Age of Onset  . Pulmonary embolism Mother   . Lung disease Father   . Hyperlipidemia Brother   . Hypertension Brother   . Diabetes Other        Significant family h/o DM  . Arthritis Sister   . Colon cancer Neg Hx     Social History   Socioeconomic History  . Marital status: Divorced    Spouse name: Not on file  . Number of children: 3  . Years of education: Not on file  . Highest education level: Not on file  Occupational History  . Occupation: PT-custodial work  Tobacco Use  . Smoking status: Never Smoker  . Smokeless tobacco: Never Used  Vaping Use  . Vaping Use: Never used  Substance and Sexual Activity  . Alcohol use: No    Alcohol/week: 0.0 standard drinks  . Drug use: No  . Sexual activity: Not Currently  Other Topics Concern  . 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   Social Determinants of Health   Financial Resource Strain:   . Difficulty of Paying Living Expenses: Not on file  Food Insecurity:   . Worried About Charity fundraiser in the Last Year: Not on file  . Ran Out of Food in the Last Year: Not on file  Transportation Needs:   . Lack of Transportation (Medical): Not on file  . Lack of Transportation (Non-Medical): Not on file  Physical Activity:   . Days of Exercise per Week: Not on file  . Minutes of Exercise per Session: Not on file  Stress:   . Feeling of Stress : Not on file  Social  Connections:   . Frequency of Communication with Friends and Family: Not on file  . Frequency of Social Gatherings with Friends and Family: Not on file  . Attends Religious Services: Not on file  . Active Member of Clubs or Organizations: Not on file  . Attends Archivist Meetings: Not on file  . Marital Status: Not on file    Subjective: Review of Systems  Constitutional: Negative for chills and fever.  HENT: Negative for congestion and hearing loss.   Eyes: Negative for blurred vision and  double vision.  Respiratory: Negative for cough and shortness of breath.   Cardiovascular: Negative for chest pain and palpitations.  Gastrointestinal: Negative for abdominal pain, blood in stool, constipation, diarrhea, heartburn, melena and vomiting.  Genitourinary: Negative for dysuria and urgency.  Musculoskeletal: Negative for joint pain and myalgias.  Skin: Negative for itching and rash.  Neurological: Negative for dizziness and headaches.  Psychiatric/Behavioral: Negative for depression. The patient is not nervous/anxious.      Objective: BP 126/76   Pulse (!) 55   Temp 97.7 F (36.5 C)   Ht 6\' 2"  (1.88 m)   Wt 281 lb 9.6 oz (127.7 kg)   BMI 36.16 kg/m  Physical Exam Constitutional:      Appearance: Normal appearance.  HENT:     Head: Normocephalic and atraumatic.  Eyes:     Extraocular Movements: Extraocular movements intact.     Conjunctiva/sclera: Conjunctivae normal.  Cardiovascular:     Rate and Rhythm: Normal rate and regular rhythm.  Pulmonary:     Effort: Pulmonary effort is normal.     Breath sounds: Normal breath sounds.  Abdominal:     General: Bowel sounds are normal.     Palpations: Abdomen is soft.  Musculoskeletal:        General: Normal range of motion.     Cervical back: Normal range of motion and neck supple.  Skin:    General: Skin is warm.  Neurological:     General: No focal deficit present.     Mental Status: He is alert and oriented  to person, place, and time.  Psychiatric:        Mood and Affect: Mood normal.        Behavior: Behavior normal.      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  Discussed these in depth with patient today.  We will plan on repeat MRI October 2022.  Patient's reflux well controlled on as needed omeprazole.  No alarm symptoms at present.  If his symptoms worsen, or he develops associated chest pain, shortness of breath, dysphagia, odynophagia, etc we will pursue EGD.  Colonoscopy recall 2022 for surveillance purposes due to history of colon polyps.  Will schedule on follow-up visit.  Follow-up with me in 1 year or sooner if needed.  03/20/2020 4:14 PM   Disclaimer: This note was dictated with voice recognition software. Similar sounding words can inadvertently be transcribed and may not be corrected upon review.

## 2020-03-27 ENCOUNTER — Encounter (INDEPENDENT_AMBULATORY_CARE_PROVIDER_SITE_OTHER): Payer: Self-pay | Admitting: Internal Medicine

## 2020-03-27 ENCOUNTER — Ambulatory Visit (INDEPENDENT_AMBULATORY_CARE_PROVIDER_SITE_OTHER): Payer: Medicare Other | Admitting: Internal Medicine

## 2020-03-27 ENCOUNTER — Other Ambulatory Visit: Payer: Self-pay

## 2020-03-27 VITALS — BP 128/82 | HR 55 | Temp 97.5°F | Ht 74.0 in | Wt 277.8 lb

## 2020-03-27 DIAGNOSIS — E669 Obesity, unspecified: Secondary | ICD-10-CM | POA: Diagnosis not present

## 2020-03-27 DIAGNOSIS — R972 Elevated prostate specific antigen [PSA]: Secondary | ICD-10-CM | POA: Diagnosis not present

## 2020-03-27 DIAGNOSIS — I10 Essential (primary) hypertension: Secondary | ICD-10-CM | POA: Diagnosis not present

## 2020-03-27 DIAGNOSIS — R6882 Decreased libido: Secondary | ICD-10-CM

## 2020-03-27 DIAGNOSIS — I2510 Atherosclerotic heart disease of native coronary artery without angina pectoris: Secondary | ICD-10-CM

## 2020-03-27 DIAGNOSIS — Z1159 Encounter for screening for other viral diseases: Secondary | ICD-10-CM

## 2020-03-27 DIAGNOSIS — Z23 Encounter for immunization: Secondary | ICD-10-CM | POA: Diagnosis not present

## 2020-03-27 NOTE — Progress Notes (Signed)
Metrics: Intervention Frequency ACO  Documented Smoking Status Yearly  Screened one or more times in 24 months  Cessation Counseling or  Active cessation medication Past 24 months  Past 24 months   Guideline developer: UpToDate (See UpToDate for funding source) Date Released: 2014       Wellness Office Visit  Subjective:  [name], male    DOB: [birth year]  Age: 64 y.o. MRN: [id number]  CC: This man comes in for follow-up of hypertension, obesity, elevated PSA. HPI  On closer questioning, he also describes decreased libido.  He previously had testosterone levels checked by his previous physician, Dr. Luan Pulling but he cannot remember the result and cannot remember the advice he was given regarding this. He has been somewhat inconsistent with nutrition but has still managed to lose weight since the last time I saw him. His exercise is not consistent presently. He did see the urologist who did not have any more comments but wanted to see the results of his PSA and free PSA.  He apparently did have a rectal examination to check his prostate. Past Medical History:  Diagnosis Date  . Atrial fibrillation ([place])    Diagnosed 09/2009, on flecainide  . Bradycardia    Event monitor 2010: HR down to 45, asymptomatic.  [name] Coronary atherosclerosis of native coronary artery    Nonobstructive and distal disease by cath 2005.   [name] Essential hypertension   . Hyperlipidemia   . OSA (obstructive sleep apnea)   . Renal cyst   . Ringing in ears   . Sleep apnea    Past Surgical History:  Procedure Laterality Date  . BIOPSY  01/04/2018   Procedure: BIOPSY;  Surgeon: [name], MD;  [facility];  Service: Endoscopy;;  ascending colon   . CARDIAC CATHETERIZATION  2005  . COLONOSCOPY  05/2006   SLF: 3 mm sigmoid: Tubular adenoma removed.  . COLONOSCOPY N/A 05/06/2016   Dr. [name]: two sessile polyps in mid transverse colon and hepatic flexure (tubular adenomas).  ext/int hemorrhoids. tortuous left colon.   . COLONOSCOPY WITH PROPOFOL N/A 01/04/2018   Procedure: COLONOSCOPY WITH PROPOFOL;  Surgeon: [name], MD;  [facility];  Service: Endoscopy;  Laterality: N/A;  2:30pm  . KNEE ARTHROSCOPY    . Left anterior cruciate ligament reconstruction    . left shoulder surgery     dr . [name]   . POLYPECTOMY  05/06/2016   Procedure: POLYPECTOMY;  Surgeon: [name], MD;  [facility];  Service: Endoscopy;;  transverse colon polyp, hepatic flexure polyp,   . Right shoulder surgery    . ROBOT ASSISTED LAPAROSCOPIC NEPHRECTOMY Left 07/04/2018   Procedure: XI ROBOTIC ASSISTED LAPAROSCOPIC RENAL CYST DECORTICATION;  Surgeon: [name], MD;  [facility];  Service: Urology;  Laterality: Left;  3 HRSPROCEDURE: ROBOTIC RENAL CYST DECORTICATION  . TONSILLECTOMY    . TOTAL KNEE ARTHROPLASTY       Family History  Problem Relation Age of Onset  . Pulmonary embolism Mother   . Lung disease Father   . Hyperlipidemia Brother   . Hypertension Brother   . Diabetes Other        Significant family h/o DM  . Arthritis Sister   . 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   Social History  Tobacco Use  . Smoking status: Never Smoker  . Smokeless tobacco: Never Used  Substance Use Topics  . Alcohol use: No    Alcohol/week: 0.0 standard drinks    Current Meds  Medication Sig  . amLODipine (NORVASC) 10 MG tablet Take 1 tablet (10 mg total) by mouth daily.  . Cholecalciferol (VITAMIN D-3) 125 MCG (5000 UT) TABS Take 2 tablets by mouth daily.  . flecainide (TAMBOCOR) 50 MG tablet Take 1 tablet (50 mg total) by mouth 2 (two) times daily.  . furosemide (LASIX) 40 MG tablet Take 40 mg by mouth daily as needed for fluid.   [name] losartan (COZAAR) 100 MG tablet Take 1 tablet (100 mg  total) by mouth daily.  [name] omeprazole (PRILOSEC) 20 MG capsule TAKE 1 CAPSULE BY MOUTH  DAILY  . pravastatin (PRAVACHOL) 40 MG tablet Take 40 mg by mouth as needed.   . tamsulosin (FLOMAX) 0.4 MG CAPS capsule TAKE 1 CAPSULE BY MOUTH EVERY DAY (Patient taking differently: 0.4 mg 3 (three) times a week. )      Depression screen [facility] 2/9 03/27/2020 04/17/2019 04/17/2019 08/01/2014  Decreased Interest 0 0 0 0  Down, Depressed, Hopeless 0 0 0 0  PHQ - 2 Score 0 0 0 0  Altered sleeping 0 - - -  Tired, decreased energy 0 - - -  Change in appetite 0 - - -  Feeling bad or failure about yourself  0 - - -  Trouble concentrating 0 - - -  Moving slowly or fidgety/restless 0 - - -  Suicidal thoughts 0 - - -  PHQ-9 Score 0 - - -  Difficult doing work/chores Not difficult at all - - -     Objective:   Today's Vitals: BP 128/82   Pulse (!) 55   Temp (!) 97.5 F (36.4 C) (Temporal)   Ht 6\' 2"  (1.88 m)   Wt 277 lb 12.8 oz (126 kg)   SpO2 98%   BMI 35.67 kg/m  Vitals with BMI 03/27/2020 03/20/2020 02/20/2020  Height 6\' 2"  6\' 2"  6\' 2"   Weight 277 lbs 13 oz 281 lbs 10 oz 276 lbs  BMI 35.65 09.47 09.62  Systolic 836 629 476  Diastolic 82 76 72  Pulse 55 55 56     Physical Exam  Although he remains obese, he has lost weight since the last time I saw him.  Blood pressure is acceptable.     Assessment   1. Essential hypertension, benign   2. Obesity (BMI 30-39.9)   3. Elevated PSA   4. Decreased libido   5. Encounter for hepatitis C screening test for low risk patient       Tests ordered Orders Placed This Encounter  Procedures  . PSA, Total with Reflex to PSA, Free  . Testosterone Total,Free,Bio, Males  . Hepatitis C antibody     Plan: 1. He will continue with losartan for his hypertension which is under good control. 2. He will continue to work on nutrition to lose weight. 3. Blood work is ordered,, especially for his PSA.  I will also check testosterone  levels. 4. High-dose influenza vaccination was given today. 5. Follow-up in 3 months.  I will send the PSA result to his urologist with [facility], Dr. Louis Meckel.   No orders of the defined types were placed in this encounter.   Doree Albee, MD

## 2020-03-27 NOTE — Addendum Note (Signed)
Addended by: Anibal Henderson on: 03/27/2020 11:32 AM   Modules accepted: Orders

## 2020-04-01 DIAGNOSIS — Z1159 Encounter for screening for other viral diseases: Secondary | ICD-10-CM | POA: Diagnosis not present

## 2020-04-01 DIAGNOSIS — R6882 Decreased libido: Secondary | ICD-10-CM | POA: Diagnosis not present

## 2020-04-01 DIAGNOSIS — R972 Elevated prostate specific antigen [PSA]: Secondary | ICD-10-CM | POA: Diagnosis not present

## 2020-04-02 LAB — REFLEX PSA, FREE
PSA, % Free: 22 % (calc) — ABNORMAL LOW (ref >25)
PSA, Free: 1.6 ng/mL

## 2020-04-02 LAB — TESTOSTERONE TOTAL,FREE,BIO, MALES
Albumin: 4.3 g/dL (ref 3.6–5.1)
Sex Hormone Binding: 36 nmol/L (ref 22–77)
Testosterone, Bioavailable: 65.7 ng/dL — ABNORMAL LOW (ref >=110.0)
Testosterone, Free: 33.4 pg/mL — ABNORMAL LOW (ref 46.0–224.0)
Testosterone: 281 ng/dL (ref 250–827)

## 2020-04-02 LAB — HEPATITIS C ANTIBODY
Hepatitis C Ab: NONREACTIVE
SIGNAL TO CUT-OFF: 0.2 (ref <1.00)

## 2020-04-02 LAB — PSA, TOTAL WITH REFLEX TO PSA, FREE: PSA, Total: 7.2 ng/mL — ABNORMAL HIGH (ref <=4.0)

## 2020-04-02 NOTE — Progress Notes (Signed)
Please call the patient and tell him that the PSA has increased but his percentage free PSA shows that he is at low risk that the total PSA elevation is due to cancer.  We will continue to monitor.Also tell him that his testosterone levels are low and we should discuss this at the next visit.  Follow-up as scheduled.

## 2020-04-08 NOTE — Progress Notes (Signed)
Called Mr Jonathan Dyer today on lab results. He would like Dr Anastasio Champion office to forward the results to [facility] provider as well to notify him as well.

## 2020-05-09 ENCOUNTER — Telehealth (INDEPENDENT_AMBULATORY_CARE_PROVIDER_SITE_OTHER): Payer: Self-pay

## 2020-05-09 NOTE — Telephone Encounter (Signed)
Feel like Tuesday morning with a tickle in throat ear stopped up Only in chest . Other then a head cold; NO COVID test. No fever, then he was going to go then. But since then he wanted know should he go work. So, Still taking all the vitamins. 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  Son brought him a 1/2 gal of ice cream to eat , just in case he did not feel like eating with sore throat.  Pt also stated that son's in-laws are COVID positive over the christmas holiday they all got together. He did not. Son was exposed; but no symptoms.  Please advise.

## 2020-05-09 NOTE — Telephone Encounter (Signed)
He should NOT go to work until he has had 2 negative COVID19 Tests ,24 hours apart. If any one test is positive,then quarantine for 1 week from start of symptoms.

## 2020-05-10 NOTE — Telephone Encounter (Signed)
Return call to patient Thursday. (late entry) Pt will do as directed per DrGosrani. If he gets any worse he is told to go to ER / hospital.

## 2020-05-29 DIAGNOSIS — H5203 Hypermetropia, bilateral: Secondary | ICD-10-CM | POA: Diagnosis not present

## 2020-05-29 DIAGNOSIS — H2513 Age-related nuclear cataract, bilateral: Secondary | ICD-10-CM | POA: Diagnosis not present

## 2020-06-24 ENCOUNTER — Encounter: Payer: Self-pay | Admitting: Cardiology

## 2020-06-24 ENCOUNTER — Ambulatory Visit (INDEPENDENT_AMBULATORY_CARE_PROVIDER_SITE_OTHER): Payer: Medicare Other | Admitting: Cardiology

## 2020-06-24 VITALS — BP 128/78 | HR 51 | Ht 74.0 in | Wt 279.0 lb

## 2020-06-24 DIAGNOSIS — I10 Essential (primary) hypertension: Secondary | ICD-10-CM

## 2020-06-24 DIAGNOSIS — I480 Paroxysmal atrial fibrillation: Secondary | ICD-10-CM

## 2020-06-24 NOTE — Patient Instructions (Signed)
Your physician wants you to follow-up in: [place] will receive a reminder letter in the mail two months in advance. If you don't receive a letter, please call our office to schedule the follow-up appointment.  Your physician recommends that you continue on your current medications as directed. Please refer to the Current Medication list given to you today.  DISCONTINUE POTASSIUM  Thank you for choosing [facility]!!

## 2020-06-24 NOTE — Progress Notes (Signed)
Cardiology Office Note  Date: 06/24/2020   ID: Doctor, Sheahan 1955-11-06, MRN [id number]  PCP:  Doree Albee, MD  Cardiologist:  Rozann Lesches, MD Electrophysiologist:  None   Chief Complaint  Patient presents with  . Cardiac follow-up    History of Present Illness: Jonathan Dyer is a 65 y.o. male last seen in October 2021 by Ms. Strader PA-C.  He presents for a routine visit.  Describes intermittent sense of palpitations, usually when he is still in the evenings.  No exertional component.  He has been riding his bike, plans to do two races out [place] in the late summer to early fall, 42 miles and 62 miles respectively.  He is riding about 25 miles at a time now.  I reviewed his medications which are outlined below.  Also personally reviewed his ECG which shows sinus bradycardia with normal intervals.  I reviewed his last lab work as noted below.  He had a follow-up GXT in 2020.  Past Medical History:  Diagnosis Date  . Atrial fibrillation ([place])    Diagnosed 09/2009, on flecainide  . Bradycardia    Event monitor 2010: HR down to 45, asymptomatic.  [name] Coronary atherosclerosis of native coronary artery    Nonobstructive and distal disease by cath 2005.   [name] Essential hypertension   . Hyperlipidemia   . OSA (obstructive sleep apnea)   . Renal cyst   . Ringing in ears   . Sleep apnea     Past Surgical History:  Procedure Laterality Date  . BIOPSY  01/04/2018   Procedure: BIOPSY;  Surgeon: [name], MD;  [facility];  Service: Endoscopy;;  ascending colon   . CARDIAC CATHETERIZATION  2005  . COLONOSCOPY  05/2006   SLF: 3 mm sigmoid: Tubular adenoma removed.  . COLONOSCOPY N/A 05/06/2016   Dr. [name]: two sessile polyps in mid transverse colon and hepatic flexure (tubular adenomas). ext/int hemorrhoids. tortuous left colon.   . COLONOSCOPY WITH PROPOFOL N/A 01/04/2018   Procedure: COLONOSCOPY WITH PROPOFOL;  Surgeon: [name], MD;   [facility];  Service: Endoscopy;  Laterality: N/A;  2:30pm  . KNEE ARTHROSCOPY    . Left anterior cruciate ligament reconstruction    . left shoulder surgery     dr . [name]   . POLYPECTOMY  05/06/2016   Procedure: POLYPECTOMY;  Surgeon: [name], MD;  [facility];  Service: Endoscopy;;  transverse colon polyp, hepatic flexure polyp,   . Right shoulder surgery    . ROBOT ASSISTED LAPAROSCOPIC NEPHRECTOMY Left 07/04/2018   Procedure: XI ROBOTIC ASSISTED LAPAROSCOPIC RENAL CYST DECORTICATION;  Surgeon: [name], MD;  [facility];  Service: Urology;  Laterality: Left;  3 HRSPROCEDURE: ROBOTIC RENAL CYST DECORTICATION  . TONSILLECTOMY    . TOTAL KNEE ARTHROPLASTY      Current Outpatient Medications  Medication Sig Dispense Refill  . amLODipine (NORVASC) 10 MG tablet Take 1 tablet (10 mg total) by mouth daily. 90 tablet 3  . Cholecalciferol (VITAMIN D-3) 125 MCG (5000 UT) TABS Take 2 tablets by mouth daily.    . flecainide (TAMBOCOR) 50 MG tablet Take 1 tablet (50 mg total) by mouth 2 (two) times daily. 180 tablet 3  . furosemide (LASIX) 40 MG tablet Take 40 mg by mouth daily as needed for fluid.     [name] losartan (COZAAR) 100 MG tablet Take 1 tablet (100 mg total) by mouth daily.  90 tablet 3  . omeprazole (PRILOSEC) 20 MG capsule TAKE 1 CAPSULE BY MOUTH  DAILY 90 capsule 3  . pravastatin (PRAVACHOL) 40 MG tablet Take 40 mg by mouth as needed.     . tamsulosin (FLOMAX) 0.4 MG CAPS capsule TAKE 1 CAPSULE BY MOUTH EVERY DAY (Patient taking differently: 0.4 mg 3 (three) times a week.) 30 capsule 0   No current facility-administered medications for this visit.   Allergies:  Clindamycin/lincomycin and Penicillins   ROS: No syncope.  Physical Exam: VS:  BP 128/78   Pulse (!) 51   Ht 6\' 2"  (1.88 m)   Wt 279 lb (126.6 kg)   SpO2 95%   BMI 35.82 kg/m , BMI Body mass index is 35.82 kg/m.  Wt Readings from Last 3 Encounters:  06/24/20 279  lb (126.6 kg)  03/27/20 277 lb 12.8 oz (126 kg)  03/20/20 281 lb 9.6 oz (127.7 kg)    General: Patient appears comfortable at rest. HEENT: Conjunctiva and lids normal, wearing a mask. Neck: Supple, no elevated JVP or carotid bruits, no thyromegaly. Lungs: Clear to auscultation, nonlabored breathing at rest. Cardiac: Regular rate and rhythm, no S3 or significant systolic murmur, no pericardial rub. Extremities: No pitting edema.  ECG:  An ECG dated 02/20/2020 was personally reviewed today and demonstrated:  Normal sinus rhythm.  Recent Labwork: 10/03/2019: TSH 1.43 12/27/2019: ALT 15; AST 14; BUN 12; Creat 1.23; Potassium 4.2; Sodium 140     Component Value Date/Time   CHOL 207 (H) 10/03/2019 0926   TRIG 208 (H) 10/03/2019 0926   HDL 41 10/03/2019 0926   CHOLHDL 5.0 (H) 10/03/2019 0926   LDLCALC 131 (H) 10/03/2019 0926    Other Studies Reviewed Today:  Echocardiogram 11/18/2016: - Left ventricle: The cavity size was normal. Wall thickness was  increased in a pattern of mild LVH. Systolic function was normal.  The estimated ejection fraction was in the range of 55% to 60%.  Wall motion was normal; there were no regional wall motion  abnormalities. Left ventricular diastolic function parameters  were normal.  - Aortic valve: Mildly calcified annulus. Trileaflet; mildly  thickened leaflets. Valve area (VTI): 3.42 cm^2. Valve area  (Vmax): 3.19 cm^2. Valve area (Vmean): 2.93 cm^2.  - Mitral valve: Mildly calcified annulus. Mildly thickened leaflets.  - Technically adequate study.   GXT 01/03/2019:  Blood pressure demonstrated a hypertensive response to exercise.  There was no ST segment deviation noted during stress.  [facility] treadmill score portends a low risk of cardiac events.  Assessment and Plan:  1.  Paroxysmal atrial fibrillation with CHA2DS2-VASc score of 3.  He has preferred to hold off on anticoagulation per discussion of her time.  Noted to be in sinus  bradycardia today on flecainide, not on any AV nodal blockers given resting bradycardia.  I reviewed his lab work from August 2021.  Screening GXT in 2020 was low risk.  He continues to exercise regularly.  2.  History of atypical chest pain, no recurrences.  He has a history of mild coronary atherosclerosis and some distal vessel disease.  No active angina symptoms with exercise.  He is on Pravachol.  3.  Essential hypertension, currently on losartan and Norvasc.  No changes were made today.  Systolic is in the 676P.  Medication Adjustments/Labs and Tests Ordered: Current medicines are reviewed at length with the patient today.  Concerns regarding medicines are outlined above.   Tests Ordered: Orders Placed This Encounter  Procedures  . EKG 12-Lead  Medication Changes: No orders of the defined types were placed in this encounter.   Disposition:  Follow up 6 months.  Signed, Satira Sark, MD, [facility] 06/24/2020 2:06 PM    [facility] [place], [place],  [address] Phone: [phone]; Fax: [phone]

## 2020-06-28 ENCOUNTER — Other Ambulatory Visit: Payer: Self-pay | Admitting: Cardiology

## 2020-07-01 ENCOUNTER — Encounter (INDEPENDENT_AMBULATORY_CARE_PROVIDER_SITE_OTHER): Payer: Self-pay | Admitting: Internal Medicine

## 2020-07-01 ENCOUNTER — Other Ambulatory Visit: Payer: Self-pay

## 2020-07-01 ENCOUNTER — Ambulatory Visit (INDEPENDENT_AMBULATORY_CARE_PROVIDER_SITE_OTHER): Payer: Medicare Other | Admitting: Internal Medicine

## 2020-07-01 VITALS — BP 138/76 | HR 68 | Temp 97.0°F | Resp 19 | Ht 74.0 in | Wt 279.0 lb

## 2020-07-01 DIAGNOSIS — R972 Elevated prostate specific antigen [PSA]: Secondary | ICD-10-CM

## 2020-07-01 DIAGNOSIS — I2510 Atherosclerotic heart disease of native coronary artery without angina pectoris: Secondary | ICD-10-CM

## 2020-07-01 DIAGNOSIS — I10 Essential (primary) hypertension: Secondary | ICD-10-CM

## 2020-07-01 DIAGNOSIS — R6882 Decreased libido: Secondary | ICD-10-CM

## 2020-07-01 MED ORDER — TESTOSTERONE 20 % CREA: 100.0000 mg | TOPICAL_CREAM | Freq: Two times a day (BID) | 0 refills | Status: DC

## 2020-07-01 NOTE — Progress Notes (Signed)
Metrics: Intervention Frequency ACO  Documented Smoking Status Yearly  Screened one or more times in 24 months  Cessation Counseling or  Active cessation medication Past 24 months  Past 24 months   Guideline developer: UpToDate (See UpToDate for funding source) Date Released: 2014       Wellness Office Visit  Subjective:  Patient ID: Esker Dever, male    DOB: 1956/02/10  Age: 65 y.o. MRN: [id number]  CC: This man comes in to review his most recent blood work and further recommendations. HPI  His PSA remains elevated and is slightly higher than last time but percentage free PSA shows that he has only about a 16% chance that the total PSA elevation is due to malignancy.  He is being followed also by urology who feels that this is probably related to his BPH. His free testosterone levels are very low and he certainly appears to have symptoms of testosterone deficiency.  Past Medical History:  Diagnosis Date  . Atrial fibrillation ([place])    Diagnosed 09/2009, on flecainide  . Bradycardia    Event monitor 2010: HR down to 45, asymptomatic.  [name] Coronary atherosclerosis of native coronary artery    Nonobstructive and distal disease by cath 2005.   [name] Essential hypertension   . Hyperlipidemia   . OSA (obstructive sleep apnea)   . Renal cyst   . Ringing in ears   . Sleep apnea    Past Surgical History:  Procedure Laterality Date  . BIOPSY  01/04/2018   Procedure: BIOPSY;  Surgeon: [name], MD;  [facility];  Service: Endoscopy;;  ascending colon   . CARDIAC CATHETERIZATION  2005  . COLONOSCOPY  05/2006   SLF: 3 mm sigmoid: Tubular adenoma removed.  . COLONOSCOPY N/A 05/06/2016   Dr. [name]: two sessile polyps in mid transverse colon and hepatic flexure (tubular adenomas). ext/int hemorrhoids. tortuous left colon.   . COLONOSCOPY WITH PROPOFOL N/A 01/04/2018   Procedure: COLONOSCOPY WITH PROPOFOL;  Surgeon: [name], MD;  [facility];   Service: Endoscopy;  Laterality: N/A;  2:30pm  . KNEE ARTHROSCOPY    . Left anterior cruciate ligament reconstruction    . left shoulder surgery     dr . [name]   . POLYPECTOMY  05/06/2016   Procedure: POLYPECTOMY;  Surgeon: [name], MD;  [facility];  Service: Endoscopy;;  transverse colon polyp, hepatic flexure polyp,   . Right shoulder surgery    . ROBOT ASSISTED LAPAROSCOPIC NEPHRECTOMY Left 07/04/2018   Procedure: XI ROBOTIC ASSISTED LAPAROSCOPIC RENAL CYST DECORTICATION;  Surgeon: [name], MD;  [facility];  Service: Urology;  Laterality: Left;  3 HRSPROCEDURE: ROBOTIC RENAL CYST DECORTICATION  . TONSILLECTOMY    . TOTAL KNEE ARTHROPLASTY       Family History  Problem Relation Age of Onset  . Pulmonary embolism Mother   . Lung disease Father   . Hyperlipidemia Brother   . Hypertension Brother   . Diabetes Other        Significant family h/o DM  . Arthritis Sister   . 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   Social History   Tobacco Use  . Smoking status: Never Smoker  . Smokeless tobacco: Never Used  Substance Use Topics  . Alcohol use: No    Alcohol/week: 0.0  standard drinks    Current Meds  Medication Sig  . amLODipine (NORVASC) 10 MG tablet Take 1 tablet (10 mg total) by mouth daily.  . Cholecalciferol (VITAMIN D-3) 125 MCG (5000 UT) TABS Take 2 tablets by mouth daily.  . flecainide (TAMBOCOR) 50 MG tablet Take 1 tablet (50 mg total) by mouth 2 (two) times daily.  . furosemide (LASIX) 40 MG tablet Take 40 mg by mouth daily as needed for fluid.   [name] losartan (COZAAR) 100 MG tablet TAKE 1 TABLET DAILY  . omeprazole (PRILOSEC) 20 MG capsule TAKE 1 CAPSULE BY MOUTH  DAILY  . tamsulosin (FLOMAX) 0.4 MG CAPS capsule TAKE 1 CAPSULE BY MOUTH EVERY DAY (Patient taking differently: 0.4 mg 3  (three) times a week.)  . Testosterone 20 % CREA Apply 100 mg topically 2 (two) times daily.  . [DISCONTINUED] pravastatin (PRAVACHOL) 40 MG tablet Take 40 mg by mouth as needed.      [place] Office Visit from 03/27/2020 in [facility]  PHQ-9 Total Score 0      Objective:   Today's Vitals: BP 138/76 (BP Location: Right Arm, Patient Position: Sitting, Cuff Size: Normal)   Pulse 68   Temp (!) 97 F (36.1 C) (Temporal)   Resp 19   Ht 6\' 2"  (1.88 m)   Wt 279 lb (126.6 kg)   SpO2 98%   BMI 35.82 kg/m  Vitals with BMI 07/01/2020 06/24/2020 03/27/2020  Height 6\' 2"  6\' 2"  6\' 2"   Weight 279 lbs 279 lbs 277 lbs 13 oz  BMI 35.81 97.02 63.78  Systolic 588 502 774  Diastolic 76 78 82  Pulse 68 51 55     Physical Exam  He looks systemically well.  He remains obese.  Blood pressure is acceptable for his age.     Assessment   1. Essential hypertension, benign   2. Elevated PSA   3. Coronary artery disease involving native heart without angina pectoris, unspecified vessel or lesion type   4. Decreased libido       Tests ordered No orders of the defined types were placed in this encounter.    Plan: 1. Today I spent most of the visit discussing testosterone therapy.  I reviewed the FDA warnings, benefits and side effects and mode of administration.  I also reviewed the connection between prostate cancer and testosterone therapy.  It is generally not recommended to use testosterone therapy in the absence of active prostate cancer.  However, at this point in time, based on several serial PSA levels, I do not think he has prostate cancer.  He is agreeable for trial and he would like to try testosterone cream.  I have called in testosterone 12% cream, 2 clicks, 878 mg applied to the scrotal skin daily to [facility]. 2. I will see him in 2 months time to see how he is doing and we will check levels then.  I spent 30 minutes discussing testosterone therapy and  all its ramifications today..   Meds ordered this encounter  Medications  . Testosterone 20 % CREA    Sig: Apply 100 mg topically 2 (two) times daily.    Dispense:  100 g    Refill:  0    Nimish Luther Parody, MD

## 2020-07-12 ENCOUNTER — Other Ambulatory Visit: Payer: Self-pay | Admitting: Cardiology

## 2020-07-15 ENCOUNTER — Other Ambulatory Visit: Payer: Self-pay | Admitting: Cardiology

## 2020-07-17 ENCOUNTER — Ambulatory Visit (INDEPENDENT_AMBULATORY_CARE_PROVIDER_SITE_OTHER): Payer: Medicare Other | Admitting: Internal Medicine

## 2020-07-17 ENCOUNTER — Other Ambulatory Visit: Payer: Self-pay

## 2020-07-17 ENCOUNTER — Encounter (INDEPENDENT_AMBULATORY_CARE_PROVIDER_SITE_OTHER): Payer: Self-pay | Admitting: Internal Medicine

## 2020-07-17 VITALS — BP 139/82 | HR 89 | Temp 97.6°F | Resp 18 | Ht 74.0 in | Wt 284.8 lb

## 2020-07-17 DIAGNOSIS — N5082 Scrotal pain: Secondary | ICD-10-CM | POA: Diagnosis not present

## 2020-07-17 DIAGNOSIS — I2510 Atherosclerotic heart disease of native coronary artery without angina pectoris: Secondary | ICD-10-CM | POA: Diagnosis not present

## 2020-07-17 MED ORDER — CIPROFLOXACIN HCL 500 MG PO TABS: 500.0000 mg | ORAL_TABLET | Freq: Two times a day (BID) | ORAL | 0 refills | Status: DC

## 2020-07-17 NOTE — Progress Notes (Signed)
Metrics: Intervention Frequency ACO  Documented Smoking Status Yearly  Screened one or more times in 24 months  Cessation Counseling or  Active cessation medication Past 24 months  Past 24 months   Guideline developer: UpToDate (See UpToDate for funding source) Date Released: 2014       Wellness Office Visit  Subjective:  [name], male    DOB: 09-06-1955  Age: 65 y.o. MRN: [id number]  CC: Testicular discomfort HPI  This man comes in for an acute visit with the above symptoms which she has had for the last 2 weeks or so since starting testosterone cream therapy which is being applied to the scrotal skin.  He also describes some difficulty with micturition although no significant dysuria or hematuria. I noticed that he did have an elevation in PSA previously. Past Medical History:  Diagnosis Date  . Atrial fibrillation ([place])    Diagnosed 09/2009, on flecainide  . Bradycardia    Event monitor 2010: HR down to 45, asymptomatic.  [name] Coronary atherosclerosis of native coronary artery    Nonobstructive and distal disease by cath 2005.   [name] Essential hypertension   . Hyperlipidemia   . OSA (obstructive sleep apnea)   . Renal cyst   . Ringing in ears   . Sleep apnea    Past Surgical History:  Procedure Laterality Date  . BIOPSY  01/04/2018   Procedure: BIOPSY;  Surgeon: [name], MD;  [facility];  Service: Endoscopy;;  ascending colon   . CARDIAC CATHETERIZATION  2005  . COLONOSCOPY  05/2006   SLF: 3 mm sigmoid: Tubular adenoma removed.  . COLONOSCOPY N/A 05/06/2016   Dr. [name]: two sessile polyps in mid transverse colon and hepatic flexure (tubular adenomas). ext/int hemorrhoids. tortuous left colon.   . COLONOSCOPY WITH PROPOFOL N/A 01/04/2018   Procedure: COLONOSCOPY WITH PROPOFOL;  Surgeon: [name], MD;  [facility];  Service: Endoscopy;  Laterality: N/A;  2:30pm  . KNEE ARTHROSCOPY    . Left anterior cruciate  ligament reconstruction    . left shoulder surgery     dr . [name]   . POLYPECTOMY  05/06/2016   Procedure: POLYPECTOMY;  Surgeon: [name], MD;  [facility];  Service: Endoscopy;;  transverse colon polyp, hepatic flexure polyp,   . Right shoulder surgery    . ROBOT ASSISTED LAPAROSCOPIC NEPHRECTOMY Left 07/04/2018   Procedure: XI ROBOTIC ASSISTED LAPAROSCOPIC RENAL CYST DECORTICATION;  Surgeon: [name], MD;  [facility];  Service: Urology;  Laterality: Left;  3 HRSPROCEDURE: ROBOTIC RENAL CYST DECORTICATION  . TONSILLECTOMY    . TOTAL KNEE ARTHROPLASTY       Family History  Problem Relation Age of Onset  . Pulmonary embolism Mother   . Lung disease Father   . Hyperlipidemia Brother   . Hypertension Brother   . Diabetes Other        Significant family h/o DM  . Arthritis Sister   . 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   Social History   Tobacco Use  . Smoking status: Never Smoker  . Smokeless tobacco: Never Used  Substance Use Topics  . Alcohol use: No    Alcohol/week: 0.0 standard drinks    Current Meds  Medication Sig  . amLODipine (NORVASC) 10 MG tablet TAKE 1 TABLET  DAILY  . Cholecalciferol (VITAMIN D-3) 125 MCG (5000 UT) TABS Take 2 tablets by mouth daily.  . ciprofloxacin (CIPRO) 500 MG tablet Take 1 tablet (500 mg total) by mouth 2 (two) times daily.  . flecainide (TAMBOCOR) 50 MG tablet TAKE 1 TABLET TWICE A DAY  . furosemide (LASIX) 40 MG tablet Take 40 mg by mouth daily as needed for fluid.   [name] losartan (COZAAR) 100 MG tablet TAKE 1 TABLET DAILY  . omeprazole (PRILOSEC) 20 MG capsule TAKE 1 CAPSULE BY MOUTH  DAILY  . tamsulosin (FLOMAX) 0.4 MG CAPS capsule TAKE 1 CAPSULE BY MOUTH EVERY DAY (Patient taking differently: 0.4 mg 3 (three) times a week.)  . Testosterone 20 % CREA  Apply 100 mg topically 2 (two) times daily.     [place] Office Visit from 03/27/2020 in [facility]  PHQ-9 Total Score 0      Objective:   Today's Vitals: BP 139/82 (BP Location: Right Arm, Patient Position: Sitting, Cuff Size: Large)   Pulse 89   Temp 97.6 F (36.4 C) (Temporal)   Resp 18   Ht 6\' 2"  (1.88 m)   Wt 284 lb 12.8 oz (129.2 kg)   SpO2 98%   BMI 36.57 kg/m  Vitals with BMI 07/17/2020 07/01/2020 06/24/2020  Height 6\' 2"  6\' 2"  6\' 2"   Weight 284 lbs 13 oz 279 lbs 279 lbs  BMI 36.55 16.55 37.48  Systolic 270 786 754  Diastolic 82 76 78  Pulse 89 68 51     Physical Exam  He looks systemically well does not appear to be in any discomfort whilst he is sitting on a chair.     Assessment   1. Scrotal pain       Tests ordered No orders of the defined types were placed in this encounter.    Plan: 1. I told the patient that it is unusual for testosterone cream to produce such symptoms but since the timeline is very coincidental, I have recommended that he discontinue testosterone cream for the time being.  I will treat him empirically with ciprofloxacin in case he has an epididymitis or in fact even a prostatitis with the previous elevation in PSA levels.  I will see him in the next 3 weeks or so to see how he is doing and review again.  I told him it is possible that he may not tolerate testosterone cream and we may have to go to testosterone injections.  He understood and appreciates.   Meds ordered this encounter  Medications  . ciprofloxacin (CIPRO) 500 MG tablet    Sig: Take 1 tablet (500 mg total) by mouth 2 (two) times daily.    Dispense:  20 tablet    Refill:  0    Kellan Boehlke Luther Parody, MD

## 2020-08-07 ENCOUNTER — Other Ambulatory Visit: Payer: Self-pay

## 2020-08-07 ENCOUNTER — Encounter (INDEPENDENT_AMBULATORY_CARE_PROVIDER_SITE_OTHER): Payer: Self-pay | Admitting: Internal Medicine

## 2020-08-07 ENCOUNTER — Ambulatory Visit (INDEPENDENT_AMBULATORY_CARE_PROVIDER_SITE_OTHER): Payer: Medicare Other | Admitting: Internal Medicine

## 2020-08-07 VITALS — BP 134/82 | HR 58 | Temp 97.1°F | Ht 74.0 in | Wt 284.2 lb

## 2020-08-07 DIAGNOSIS — I2510 Atherosclerotic heart disease of native coronary artery without angina pectoris: Secondary | ICD-10-CM | POA: Diagnosis not present

## 2020-08-07 DIAGNOSIS — N5082 Scrotal pain: Secondary | ICD-10-CM | POA: Diagnosis not present

## 2020-08-07 DIAGNOSIS — Z23 Encounter for immunization: Secondary | ICD-10-CM

## 2020-08-07 DIAGNOSIS — R972 Elevated prostate specific antigen [PSA]: Secondary | ICD-10-CM

## 2020-08-07 NOTE — Progress Notes (Signed)
Metrics: Intervention Frequency ACO  Documented Smoking Status Yearly  Screened one or more times in 24 months  Cessation Counseling or  Active cessation medication Past 24 months  Past 24 months   Guideline developer: UpToDate (See UpToDate for funding source) Date Released: 2014       Wellness Office Visit  Subjective:  [name], male    DOB: [birth year]  Age: 65 y.o. MRN: [id number]  CC: This man comes in for follow-up of scrotal pain, elevated PSA. HPI  When I saw him a few weeks ago, we decided to stop the testosterone cream in case this was causing the problem and put him on a course of ciprofloxacin empirically.  His pain is completely resolved. He also has a history of elevated PSA and follows urology.  His last PSA was about 4 months ago. Past Medical History:  Diagnosis Date  . Atrial fibrillation ([place])    Diagnosed 09/2009, on flecainide  . Bradycardia    Event monitor 2010: HR down to 45, asymptomatic.  [name] Coronary atherosclerosis of native coronary artery    Nonobstructive and distal disease by cath 2005.   [name] Essential hypertension   . Hyperlipidemia   . OSA (obstructive sleep apnea)   . Renal cyst   . Ringing in ears   . Sleep apnea    Past Surgical History:  Procedure Laterality Date  . BIOPSY  01/04/2018   Procedure: BIOPSY;  Surgeon: [name], MD;  [facility];  Service: Endoscopy;;  ascending colon   . CARDIAC CATHETERIZATION  2005  . COLONOSCOPY  05/2006   SLF: 3 mm sigmoid: Tubular adenoma removed.  . COLONOSCOPY N/A 05/06/2016   Dr. [name]: two sessile polyps in mid transverse colon and hepatic flexure (tubular adenomas). ext/int hemorrhoids. tortuous left colon.   . COLONOSCOPY WITH PROPOFOL N/A 01/04/2018   Procedure: COLONOSCOPY WITH PROPOFOL;  Surgeon: [name], MD;  [facility];  Service: Endoscopy;  Laterality: N/A;  2:30pm  . KNEE ARTHROSCOPY    . Left anterior cruciate ligament  reconstruction    . left shoulder surgery     dr . [name]   . POLYPECTOMY  05/06/2016   Procedure: POLYPECTOMY;  Surgeon: [name], MD;  [facility];  Service: Endoscopy;;  transverse colon polyp, hepatic flexure polyp,   . Right shoulder surgery    . ROBOT ASSISTED LAPAROSCOPIC NEPHRECTOMY Left 07/04/2018   Procedure: XI ROBOTIC ASSISTED LAPAROSCOPIC RENAL CYST DECORTICATION;  Surgeon: [name], MD;  [facility];  Service: Urology;  Laterality: Left;  3 HRSPROCEDURE: ROBOTIC RENAL CYST DECORTICATION  . TONSILLECTOMY    . TOTAL KNEE ARTHROPLASTY       Family History  Problem Relation Age of Onset  . Pulmonary embolism Mother   . Lung disease Father   . Hyperlipidemia Brother   . Hypertension Brother   . Diabetes Other        Significant family h/o DM  . Arthritis Sister   . 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   Social History   Tobacco Use  . Smoking status: Never Smoker  . Smokeless tobacco: Never Used  Substance Use Topics  . Alcohol use: No    Alcohol/week: 0.0 standard drinks    Current Meds  Medication Sig  . amLODipine (NORVASC) 10  MG tablet TAKE 1 TABLET DAILY  . Cholecalciferol (VITAMIN D-3) 125 MCG (5000 UT) TABS Take 2 tablets by mouth daily.  . flecainide (TAMBOCOR) 50 MG tablet TAKE 1 TABLET TWICE A DAY  . furosemide (LASIX) 40 MG tablet Take 40 mg by mouth daily as needed for fluid.   [name] losartan (COZAAR) 100 MG tablet TAKE 1 TABLET DAILY  . omeprazole (PRILOSEC) 20 MG capsule TAKE 1 CAPSULE BY MOUTH  DAILY  . tamsulosin (FLOMAX) 0.4 MG CAPS capsule TAKE 1 CAPSULE BY MOUTH EVERY DAY (Patient taking differently: 0.4 mg 3 (three) times a week.)  . Testosterone 20 % CREA Apply 100 mg topically 2 (two) times daily.     [place] Office Visit from 03/27/2020 in [facility]  PHQ-9 Total Score 0      Objective:   Today's Vitals: BP 134/82   Pulse (!) 58   Temp (!) 97.1 F (36.2 C) (Temporal)   Ht 6\' 2"  (1.88 m)   Wt 284 lb 3.2 oz (128.9 kg)   SpO2 98%   BMI 36.49 kg/m  Vitals with BMI 08/07/2020 07/17/2020 07/01/2020  Height 6\' 2"  6\' 2"  6\' 2"   Weight 284 lbs 3 oz 284 lbs 13 oz 279 lbs  BMI 36.47 72.09 47.09  Systolic 628 366 294  Diastolic 82 82 76  Pulse 58 89 68     Physical Exam  He looks systemically well.  He has not lost any further weight.  Blood pressure is acceptable.     Assessment   1. Elevated PSA   2. Scrotal pain       Tests ordered Orders Placed This Encounter  Procedures  . PSA, Total with Reflex to PSA, Free     Plan: 1. I have recommended that he restart testosterone cream and see if his scrotal pain returns.  If it does, then it was very likely that the testosterone cream caused the problem.  In this case, we may have to go to testosterone injections. 2. We will repeat his PSA to see what the trend is now. 3. Follow-up in about 2 months.   No orders of the defined types were placed in this encounter.   Doree Albee, MD

## 2020-08-07 NOTE — Addendum Note (Signed)
Addended by: Anibal Henderson on: 08/07/2020 04:25 PM   Modules accepted: Orders

## 2020-08-08 ENCOUNTER — Telehealth (INDEPENDENT_AMBULATORY_CARE_PROVIDER_SITE_OTHER): Payer: Self-pay

## 2020-08-08 LAB — REFLEX PSA, FREE
PSA, % Free: 21 % (calc) — ABNORMAL LOW (ref >25)
PSA, Free: 1.6 ng/mL

## 2020-08-08 LAB — PSA, TOTAL WITH REFLEX TO PSA, FREE: PSA, Total: 7.5 ng/mL — ABNORMAL HIGH (ref <=4.0)

## 2020-08-08 NOTE — Telephone Encounter (Signed)
Patient called and left a detailed voice message and wanted to know his lab results.   I called the patient and let him know that [name] has not commented on his lab results yet and once he does we can let him know his results.  Patient stated that he would like for you to leave a detailed voice message of his lab results on his answering machine if he is not able to answer his phone.  Sending as Jonathan Dyer.

## 2020-08-15 ENCOUNTER — Telehealth (INDEPENDENT_AMBULATORY_CARE_PROVIDER_SITE_OTHER): Payer: Self-pay

## 2020-08-15 ENCOUNTER — Encounter (INDEPENDENT_AMBULATORY_CARE_PROVIDER_SITE_OTHER): Payer: Self-pay

## 2020-08-15 NOTE — Telephone Encounter (Signed)
Patient called and wanted to know his lab results and have mailed to him and left a voice message.  I called the patient back and gave him lab results. Patient verbalized an understanding and stated that he does not do anything online. I confirmed his address and mailing him a copy.

## 2020-09-03 ENCOUNTER — Telehealth: Payer: Self-pay | Admitting: Cardiology

## 2020-09-03 NOTE — Telephone Encounter (Signed)
Reports for the last 3 weeks, having flutters and retaining fluid. Reports weight dropped 4 lbs after taking furosemide 40 mg on yesterday. Denies dizziness, chest pain or sob. Reports traveling 2.5 hours to [facility] for a bike race on Saturday and traveled 2.5 hours back home. Patient says he continues to ride his bike and rode for 22 miles today. Patient is asking why he would be retaining fluid all of sudden and last took lasix 6 months ago until yesterday. Advised that traveling can cause swelling in extremities. Denies change in diet or fluid intake.

## 2020-09-03 NOTE — Telephone Encounter (Signed)
As you mention, could be related to travel, although with his described flutters he may have been having some breakthrough atrial fibrillation that may have also contributed to fluid retention.  Continue with current plan for now unless symptoms worsen.

## 2020-09-03 NOTE — Telephone Encounter (Signed)
Please give pt a call- he has a couple of questions regarding Afib and fluid retention.   [phone]

## 2020-09-04 ENCOUNTER — Ambulatory Visit (INDEPENDENT_AMBULATORY_CARE_PROVIDER_SITE_OTHER): Payer: Medicare Other | Admitting: Internal Medicine

## 2020-09-04 NOTE — Telephone Encounter (Signed)
Patient informed and verbalized understanding of plan. 

## 2020-09-13 ENCOUNTER — Encounter: Payer: Self-pay | Admitting: Cardiology

## 2020-09-13 ENCOUNTER — Ambulatory Visit (INDEPENDENT_AMBULATORY_CARE_PROVIDER_SITE_OTHER): Payer: Medicare Other

## 2020-09-13 ENCOUNTER — Other Ambulatory Visit: Payer: Self-pay | Admitting: Cardiology

## 2020-09-13 ENCOUNTER — Ambulatory Visit (INDEPENDENT_AMBULATORY_CARE_PROVIDER_SITE_OTHER): Payer: Medicare Other | Admitting: Cardiology

## 2020-09-13 VITALS — BP 140/72 | HR 56 | Ht 74.0 in | Wt 285.4 lb

## 2020-09-13 DIAGNOSIS — I2510 Atherosclerotic heart disease of native coronary artery without angina pectoris: Secondary | ICD-10-CM | POA: Diagnosis not present

## 2020-09-13 DIAGNOSIS — I480 Paroxysmal atrial fibrillation: Secondary | ICD-10-CM

## 2020-09-13 DIAGNOSIS — I10 Essential (primary) hypertension: Secondary | ICD-10-CM | POA: Diagnosis not present

## 2020-09-13 NOTE — Progress Notes (Signed)
Cardiology Office Note  Date: 09/13/2020   ID: Jonathan Dyer, DOB 1956-03-08, MRN [id number]  PCP:  Jonathan Albee, MD  Cardiologist:  Jonathan Lesches, MD Electrophysiologist:  None   Chief Complaint  Patient presents with  . Cardiac follow-up    History of Present Illness: Jonathan Dyer is a 65 y.o. male last seen in February.  He is here for a follow-up visit.  He continues to ride his bicycle, has had a few trips over the last several weeks, each over 20 miles.  His last 2 rides were in 80+ degree heat after which he gained fluid weight and felt weak, had to stop on these rides as well.  Not necessarily palpitations, but just did not feel like he had the strength.  He took Lasix to lose fluid weight each time.  Today he feels at baseline and his ECG shows sinus bradycardia as before.  He reports compliance with his medications including flecainide 50 mg twice daily.  Past Medical History:  Diagnosis Date  . Atrial fibrillation ([place])    Diagnosed 09/2009, on flecainide  . Bradycardia    Event monitor 2010: HR down to 45, asymptomatic.  [name] Coronary atherosclerosis of native coronary artery    Nonobstructive and distal disease by cath 2005.   [name] Essential hypertension   . Hyperlipidemia   . OSA (obstructive sleep apnea)   . Renal cyst   . Ringing in ears   . Sleep apnea     Past Surgical History:  Procedure Laterality Date  . BIOPSY  01/04/2018   Procedure: BIOPSY;  Surgeon: Jonathan Binder, MD;  [facility];  Service: Endoscopy;;  ascending colon   . CARDIAC CATHETERIZATION  2005  . COLONOSCOPY  05/2006   SLF: 3 mm sigmoid: Tubular adenoma removed.  . COLONOSCOPY N/A 05/06/2016   Dr. Oneida Dyer: two sessile polyps in mid transverse colon and hepatic flexure (tubular adenomas). ext/int hemorrhoids. tortuous left colon.   . COLONOSCOPY WITH PROPOFOL N/A 01/04/2018   Procedure: COLONOSCOPY WITH PROPOFOL;  Surgeon: Jonathan Binder, MD;  [facility];  Service: Endoscopy;  Laterality: N/A;  2:30pm  . KNEE ARTHROSCOPY    . Left anterior cruciate ligament reconstruction    . left shoulder surgery     dr . Jonathan Dyer   . POLYPECTOMY  05/06/2016   Procedure: POLYPECTOMY;  Surgeon: Jonathan Binder, MD;  [facility];  Service: Endoscopy;;  transverse colon polyp, hepatic flexure polyp,   . Right shoulder surgery    . ROBOT ASSISTED LAPAROSCOPIC NEPHRECTOMY Left 07/04/2018   Procedure: XI ROBOTIC ASSISTED LAPAROSCOPIC RENAL CYST DECORTICATION;  Surgeon: Jonathan Gustin, MD;  [facility];  Service: Urology;  Laterality: Left;  3 HRSPROCEDURE: ROBOTIC RENAL CYST DECORTICATION  . TONSILLECTOMY    . TOTAL KNEE ARTHROPLASTY      Current Outpatient Medications  Medication Sig Dispense Refill  . amLODipine (NORVASC) 10 MG tablet TAKE 1 TABLET DAILY 90 tablet 3  . Cholecalciferol (VITAMIN D-3) 125 MCG (5000 UT) TABS Take 2 tablets by mouth daily.    . flecainide (TAMBOCOR) 50 MG tablet TAKE 1 TABLET TWICE A DAY 180 tablet 3  . furosemide (LASIX) 40 MG tablet Take 40 mg by mouth daily as needed for fluid.     [name] losartan (COZAAR) 100 MG tablet TAKE 1 TABLET DAILY 90 tablet 3  . omeprazole (PRILOSEC) 20 MG capsule TAKE 1 CAPSULE BY MOUTH  DAILY  90 capsule 3  . tamsulosin (FLOMAX) 0.4 MG CAPS capsule TAKE 1 CAPSULE BY MOUTH EVERY DAY (Patient taking differently: 0.4 mg 3 (three) times a week.) 30 capsule 0  . Testosterone 20 % CREA Apply 100 mg topically 2 (two) times daily. 100 g 0   No current facility-administered medications for this visit.   Allergies:  Clindamycin/lincomycin, Penicillins, and Pneumococcal 13-val conj vacc   ROS: No syncope.  No chest pain.  Physical Exam: VS:  BP 140/72   Pulse (!) 56   Ht 6\' 2"  (1.88 m)   Wt 285 lb 6.4 oz (129.5 kg)   SpO2 97%   BMI 36.64 kg/m , BMI Body mass index is 36.64 kg/m.  Wt Readings from Last 3 Encounters:  09/13/20 285 lb 6.4 oz (129.5 kg)  08/07/20 284 lb  3.2 oz (128.9 kg)  07/17/20 284 lb 12.8 oz (129.2 kg)    General: Patient appears comfortable at rest. HEENT: Conjunctiva and lids normal, wearing a mask. Neck: Supple, no elevated JVP or carotid bruits, no thyromegaly. Lungs: Clear to auscultation, nonlabored breathing at rest. Cardiac: Regular rate and rhythm, no S3 or significant systolic murmur, no pericardial rub. Extremities: No pitting edema.  ECG:  An ECG dated 06/24/2020 was personally reviewed today and demonstrated:  Sinus bradycardia with normal intervals.  Recent Labwork: 10/03/2019: TSH 1.43 12/27/2019: ALT 15; AST 14; BUN 12; Creat 1.23; Potassium 4.2; Sodium 140     Component Value Date/Time   CHOL 207 (H) 10/03/2019 0926   TRIG 208 (H) 10/03/2019 0926   HDL 41 10/03/2019 0926   CHOLHDL 5.0 (H) 10/03/2019 0926   LDLCALC 131 (H) 10/03/2019 0926    Other Studies Reviewed Today:  Echocardiogram 11/18/2016: - Left ventricle: The cavity size was normal. Wall thickness was  increased in a pattern of mild LVH. Systolic function was normal.  The estimated ejection fraction was in the range of 55% to 60%.  Wall motion was normal; there were no regional wall motion  abnormalities. Left ventricular diastolic function parameters  were normal.  - Aortic valve: Mildly calcified annulus. Trileaflet; mildly  thickened leaflets. Valve area (VTI): 3.42 cm^2. Valve area  (Vmax): 3.19 cm^2. Valve area (Vmean): 2.93 cm^2.  - Mitral valve: Mildly calcified annulus. Mildly thickened leaflets.  - Technically adequate study.   GXT 01/03/2019:  Blood pressure demonstrated a hypertensive response to exercise.  There was no ST segment deviation noted during stress.  [facility] treadmill score portends a low risk of cardiac events.  Assessment and Plan:  1.  Paroxysmal atrial fibrillation with CHA2DS2-VASc score of 3.  He has preferred to hold off on medical anticoagulation.  Question whether he may be experiencing  breakthrough arrhythmias in light of the above symptoms, ECG reviewed and stable today.  We will obtain a 7-day Zio patch for further investigation, otherwise continue flecainide 50 mg twice daily for now.  This could be further uptitrated.  2.  Essential hypertension, on Cozaar and Norvasc.  No changes made today.  Medication Adjustments/Labs and Tests Ordered: Current medicines are reviewed at length with the patient today.  Concerns regarding medicines are outlined above.   Tests Ordered: Orders Placed This Encounter  Procedures  . EKG 12-Lead    Medication Changes: No orders of the defined types were placed in this encounter.   Disposition:  Follow up as scheduled.  Signed, Satira Sark, MD, [facility] 09/13/2020 3:29 PM    [facility]  [address], [place] [address] Phone: [phone]; Fax: [phone]

## 2020-09-13 NOTE — Patient Instructions (Addendum)
Medication Instructions:   Your physician recommends that you continue on your current medications as directed. Please refer to the Current Medication list given to you today.  Labwork:  none  Testing/Procedures: ZIO- Long Term Monitor Instructions   Your physician has requested you wear your ZIO patch monitor  7 days.   This is a single patch monitor.  [employer] supplies one patch monitor per enrollment.  Additional stickers are not available.   Please do not apply patch if you will be having a Nuclear Stress Test, Echocardiogram, Cardiac CT, MRI, or Chest Xray during the time frame you would be wearing the monitor. The patch cannot be worn during these tests.  You cannot remove and re-apply the ZIO XT patch monitor.   Your ZIO patch monitor will be sent USPS Priority mail from [facility] directly to your home address. The monitor may also be mailed to a PO BOX if home delivery is not available.   It may take 3-5 days to receive your monitor after you have been enrolled.   Once you have received you monitor, please review enclosed instructions.  Your monitor has already been registered assigning a specific monitor serial # to you.   Applying the monitor   Shave hair from upper left chest.   Hold abrader disc by orange tab.  Rub abrader in 40 strokes over left upper chest as indicated in your monitor instructions.   Clean area with 4 enclosed alcohol pads .  Use all pads to assure are is cleaned thoroughly.  Let dry.   Apply patch as indicated in monitor instructions.  Patch will be place under collarbone on left side of chest with arrow pointing upward.   Rub patch adhesive wings for 2 minutes.Remove white label marked "1".  Remove white label marked "2".  Rub patch adhesive wings for 2 additional minutes.   While looking in a mirror, press and release button in center of patch.  A small green light will flash 3-4 times .  This will be your only indicator the monitor has  been turned on.     Do not shower for the first 24 hours.  You may shower after the first 24 hours.   Press button if you feel a symptom. You will hear a small click.  Record Date, Time and Symptom in the Patient Log Book.   When you are ready to remove patch, follow instructions on last 2 pages of Patient Log Book.  Stick patch monitor onto last page of Patient Log Book.   Place Patient Log Book in [place] box.  Use locking tab on box and tape box closed securely.  The [employer] has [employer] on it.  Please place in mailbox as soon as possible.  Your physician should have your test results approximately 7 days after the monitor has been mailed back to [facility].   Call [place] at [phone] if you have questions regarding your ZIO XT patch monitor.  Call them immediately if you see an orange light blinking on your monitor.    If your monitor falls off in less than 4 days contact our Monitor department at [phone].  If your monitor becomes loose or falls off after 4 days call [employer] at [phone] for suggestions on securing your monitor.  Follow-Up:  Your physician recommends that you schedule a follow-up appointment in: August 2022 as planned  Any Other Special Instructions Will Be Listed Below (If Applicable).  If you  need a refill on your cardiac medications before your next appointment, please call your pharmacy.

## 2020-09-15 DIAGNOSIS — I480 Paroxysmal atrial fibrillation: Secondary | ICD-10-CM | POA: Diagnosis not present

## 2020-09-16 ENCOUNTER — Telehealth: Payer: Self-pay | Admitting: Cardiology

## 2020-09-16 NOTE — Telephone Encounter (Signed)
Patient called stating that he is wearing his heart monitor . He is having xray of his knee on Wednesday. Wanted to verify if it is ok to wear his heart monitor.  Please leave a message if he doesn't answer.

## 2020-09-16 NOTE — Telephone Encounter (Signed)
Pt aware that xray of the knee should not interfere with cardiac monitor

## 2020-09-18 DIAGNOSIS — M1711 Unilateral primary osteoarthritis, right knee: Secondary | ICD-10-CM | POA: Diagnosis not present

## 2020-09-25 DIAGNOSIS — I480 Paroxysmal atrial fibrillation: Secondary | ICD-10-CM | POA: Diagnosis not present

## 2020-09-26 ENCOUNTER — Telehealth: Payer: Self-pay | Admitting: *Deleted

## 2020-09-26 NOTE — Telephone Encounter (Signed)
Patient informed. Copy sent to PCP °

## 2020-09-26 NOTE — Telephone Encounter (Signed)
-----   Message from Satira Sark, MD sent at 09/26/2020  8:14 AM EDT ----- Results reviewed.  Please let him know that the heart monitor actually looked fairly good, no atrial fibrillation noted, only a very brief burst of SVT that would not necessarily warrant any change in medications at this time.

## 2020-10-09 ENCOUNTER — Other Ambulatory Visit: Payer: Self-pay

## 2020-10-09 ENCOUNTER — Encounter (INDEPENDENT_AMBULATORY_CARE_PROVIDER_SITE_OTHER): Payer: Self-pay | Admitting: Internal Medicine

## 2020-10-09 ENCOUNTER — Ambulatory Visit (INDEPENDENT_AMBULATORY_CARE_PROVIDER_SITE_OTHER): Payer: Medicare Other | Admitting: Internal Medicine

## 2020-10-09 VITALS — BP 133/85 | HR 88 | Temp 98.1°F | Resp 18 | Ht 74.0 in | Wt 281.6 lb

## 2020-10-09 DIAGNOSIS — R7989 Other specified abnormal findings of blood chemistry: Secondary | ICD-10-CM

## 2020-10-09 DIAGNOSIS — R6882 Decreased libido: Secondary | ICD-10-CM | POA: Diagnosis not present

## 2020-10-09 DIAGNOSIS — I2510 Atherosclerotic heart disease of native coronary artery without angina pectoris: Secondary | ICD-10-CM | POA: Diagnosis not present

## 2020-10-09 DIAGNOSIS — I10 Essential (primary) hypertension: Secondary | ICD-10-CM

## 2020-10-09 DIAGNOSIS — E782 Mixed hyperlipidemia: Secondary | ICD-10-CM | POA: Diagnosis not present

## 2020-10-09 DIAGNOSIS — R972 Elevated prostate specific antigen [PSA]: Secondary | ICD-10-CM

## 2020-10-09 MED ORDER — TESTOSTERONE 20 % CREA
100.0000 mg | TOPICAL_CREAM | Freq: Two times a day (BID) | 3 refills | Status: DC
Start: 2020-10-09 — End: 2021-08-06

## 2020-10-09 NOTE — Progress Notes (Signed)
Metrics: Intervention Frequency ACO  Documented Smoking Status Yearly  Screened one or more times in 24 months  Cessation Counseling or  Active cessation medication Past 24 months  Past 24 months   Guideline developer: UpToDate (See UpToDate for funding source) Date Released: 2014       Wellness Office Visit  Subjective:  [name], male    DOB: Sep 18, 1955  Age: 65 y.o. MRN: [id number]  CC: This man comes in for follow-up of hypertension, elevated PSA, low testosterone levels, dyslipidemia. HPI  He has been taking testosterone cream again without any problems this time and he takes 2 clicks daily.  He feels that he does not really helping him in terms of symptomatic relief but he does notice morning erections more than he used to.  His stamina has not appreciably improved. He does realize that he is not eating healthy foods most of the time and in particular he admits to drinking two 12 ounce Pepsi's every day. I discussed his last PSA level and percentage free PSA level which is mostly unchanged. Past Medical History:  Diagnosis Date  . Atrial fibrillation ([place])    Diagnosed 09/2009, on flecainide  . Bradycardia    Event monitor 2010: HR down to 45, asymptomatic.  [name] Coronary atherosclerosis of native coronary artery    Nonobstructive and distal disease by cath 2005.   [name] Essential hypertension   . Hyperlipidemia   . OSA (obstructive sleep apnea)   . Renal cyst   . Ringing in ears   . Sleep apnea    Past Surgical History:  Procedure Laterality Date  . BIOPSY  01/04/2018   Procedure: BIOPSY;  Surgeon: [name], MD;  [facility];  Service: Endoscopy;;  ascending colon   . CARDIAC CATHETERIZATION  2005  . COLONOSCOPY  05/2006   SLF: 3 mm sigmoid: Tubular adenoma removed.  . COLONOSCOPY N/A 05/06/2016   Dr. [name]: two sessile polyps in mid transverse colon and hepatic flexure (tubular adenomas). ext/int hemorrhoids. tortuous left colon.    . COLONOSCOPY WITH PROPOFOL N/A 01/04/2018   Procedure: COLONOSCOPY WITH PROPOFOL;  Surgeon: [name], MD;  [facility];  Service: Endoscopy;  Laterality: N/A;  2:30pm  . KNEE ARTHROSCOPY    . Left anterior cruciate ligament reconstruction    . left shoulder surgery     dr . [name]   . POLYPECTOMY  05/06/2016   Procedure: POLYPECTOMY;  Surgeon: [name], MD;  [facility];  Service: Endoscopy;;  transverse colon polyp, hepatic flexure polyp,   . Right shoulder surgery    . ROBOT ASSISTED LAPAROSCOPIC NEPHRECTOMY Left 07/04/2018   Procedure: XI ROBOTIC ASSISTED LAPAROSCOPIC RENAL CYST DECORTICATION;  Surgeon: [name], MD;  [facility];  Service: Urology;  Laterality: Left;  3 HRSPROCEDURE: ROBOTIC RENAL CYST DECORTICATION  . TONSILLECTOMY    . TOTAL KNEE ARTHROPLASTY       Family History  Problem Relation Age of Onset  . Pulmonary embolism Mother   . Lung disease Father   . Hyperlipidemia Brother   . Hypertension Brother   . Diabetes Other        Significant family h/o DM  . Arthritis Sister   . 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   Social History   Tobacco Use  .  Smoking status: Never Smoker  . Smokeless tobacco: Never Used  Substance Use Topics  . Alcohol use: No    Alcohol/week: 0.0 standard drinks    Current Meds  Medication Sig  . amLODipine (NORVASC) 10 MG tablet TAKE 1 TABLET DAILY  . Cholecalciferol (VITAMIN D-3) 125 MCG (5000 UT) TABS Take 2 tablets by mouth daily.  . flecainide (TAMBOCOR) 50 MG tablet TAKE 1 TABLET TWICE A DAY  . furosemide (LASIX) 40 MG tablet Take 40 mg by mouth daily as needed for fluid.   [name] losartan (COZAAR) 100 MG tablet TAKE 1 TABLET DAILY  . omeprazole (PRILOSEC) 20 MG capsule TAKE 1 CAPSULE BY MOUTH  DAILY  . tamsulosin (FLOMAX) 0.4 MG  CAPS capsule TAKE 1 CAPSULE BY MOUTH EVERY DAY (Patient taking differently: 0.4 mg 3 (three) times a week.)  . [DISCONTINUED] Testosterone 20 % CREA Apply 100 mg topically 2 (two) times daily.     [place] Office Visit from 03/27/2020 in [facility]  PHQ-9 Total Score 0      Objective:   Today's Vitals: BP 133/85 (BP Location: Right Arm, Patient Position: Sitting, Cuff Size: Normal)   Pulse 88   Temp 98.1 F (36.7 C) (Temporal)   Resp 18   Ht 6\' 2"  (1.88 m)   Wt 281 lb 9.6 oz (127.7 kg)   BMI 36.16 kg/m  Vitals with BMI 10/09/2020 09/13/2020 08/07/2020  Height 6\' 2"  6\' 2"  6\' 2"   Weight 281 lbs 10 oz 285 lbs 6 oz 284 lbs 3 oz  BMI 36.14 84.16 60.63  Systolic 016 010 932  Diastolic 85 72 82  Pulse 88 56 58     Physical Exam  He has lost about 3 pounds since the last time I saw him in March.  Blood pressure is acceptable.     Assessment   1. Essential hypertension, benign   2. Elevated PSA   3. Decreased libido   4. Mixed hyperlipidemia   5. Low testosterone       Tests ordered No orders of the defined types were placed in this encounter.    Plan: 1. I think we will increase testosterone dose so that he will be taking 100 mg twice a day now apply to the scrotal skin. 2. Continue to focus on eating healthier and we also discussed a little more regarding interval training and endurance in terms of his cycling endeavors. 3. I will see him in about 3 months time for follow-up when we will repeat all the blood work including testosterone levels. 4. I spent 30 minutes with this patient today discussing all of the above.   Meds ordered this encounter  Medications  . Testosterone 20 % CREA    Sig: Apply 100 mg topically 2 (two) times daily.    Dispense:  30 g    Refill:  3    Allen Basista Luther Parody, MD

## 2020-10-24 ENCOUNTER — Telehealth (INDEPENDENT_AMBULATORY_CARE_PROVIDER_SITE_OTHER): Payer: Self-pay

## 2020-10-24 NOTE — Telephone Encounter (Signed)
Sometimes testosterone can make you gain fluid but not fat.  This is temporary and he should continue trying to eat healthier and good luck with the bike ride !

## 2020-10-24 NOTE — Telephone Encounter (Signed)
Patient called and is concerned that he has gained 3 pounds since his last visit on 10/09/2020 and he is wanting to know if this could be from the testosterone? Patient stated that he is eating better and exercising and he wants to know if he could be retaining fluid? He also stated that he did a 40 mile bike ride on Monday and wanted to let you know and he is going to try to do 44 miles on Saturday. Please advise.

## 2020-10-28 NOTE — Telephone Encounter (Signed)
Called patient and gave him the message. Patient stated that he has noticed swelling in both of his feet and he can see even when he is barefoot that he is having swelling in both feet.

## 2020-10-28 NOTE — Telephone Encounter (Signed)
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 [name] did advise to elevate both legs for 20 minutes each. Patient verbalized an understanding and scheduled his appointment on 10/31/2020 at 9am.

## 2020-10-28 NOTE — Telephone Encounter (Signed)
If you would like to have this evaluated, please make an appointment for me to see him in the next few days or early next week.

## 2020-10-31 ENCOUNTER — Other Ambulatory Visit: Payer: Self-pay

## 2020-10-31 ENCOUNTER — Ambulatory Visit (INDEPENDENT_AMBULATORY_CARE_PROVIDER_SITE_OTHER): Payer: Medicare Other | Admitting: Internal Medicine

## 2020-10-31 ENCOUNTER — Encounter (INDEPENDENT_AMBULATORY_CARE_PROVIDER_SITE_OTHER): Payer: Self-pay | Admitting: Internal Medicine

## 2020-10-31 VITALS — BP 133/72 | HR 87 | Temp 98.0°F | Resp 18 | Ht 74.0 in | Wt 288.2 lb

## 2020-10-31 DIAGNOSIS — M7989 Other specified soft tissue disorders: Secondary | ICD-10-CM | POA: Diagnosis not present

## 2020-10-31 DIAGNOSIS — R7989 Other specified abnormal findings of blood chemistry: Secondary | ICD-10-CM

## 2020-10-31 DIAGNOSIS — I2510 Atherosclerotic heart disease of native coronary artery without angina pectoris: Secondary | ICD-10-CM

## 2020-10-31 NOTE — Progress Notes (Signed)
Metrics: Intervention Frequency ACO  Documented Smoking Status Yearly  Screened one or more times in 24 months  Cessation Counseling or  Active cessation medication Past 24 months  Past 24 months   Guideline developer: UpToDate (See UpToDate for funding source) Date Released: 2014       Wellness Office Visit  Subjective:  [name], male    DOB: 28-May-1955  Age: 65 y.o. MRN: [id number]  CC: Leg edema. HPI  This man comes in for an acute visit with symptom of above which started a couple of weeks ago.  He started increasing the dose of testosterone about 3 weeks ago.  He denies any dyspnea.  There is no pain in his legs. Past Medical History:  Diagnosis Date   Atrial fibrillation ([place])    Diagnosed 09/2009, on flecainide   Bradycardia    Event monitor 2010: HR down to 45, asymptomatic.   Coronary atherosclerosis of native coronary artery    Nonobstructive and distal disease by cath 2005.    Essential hypertension    Hyperlipidemia    OSA (obstructive sleep apnea)    Renal cyst    Ringing in ears    Sleep apnea    Past Surgical History:  Procedure Laterality Date   BIOPSY  01/04/2018   Procedure: BIOPSY;  Surgeon: [name], MD;  [facility];  Service: Endoscopy;;  ascending colon    CARDIAC CATHETERIZATION  2005   COLONOSCOPY  05/2006   SLF: 3 mm sigmoid: Tubular adenoma removed.   COLONOSCOPY N/A 05/06/2016   Dr. [name]: two sessile polyps in mid transverse colon and hepatic flexure (tubular adenomas). ext/int hemorrhoids. tortuous left colon.    COLONOSCOPY WITH PROPOFOL N/A 01/04/2018   Procedure: COLONOSCOPY WITH PROPOFOL;  Surgeon: [name], MD;  [facility];  Service: Endoscopy;  Laterality: N/A;  2:30pm   KNEE ARTHROSCOPY     Left anterior cruciate ligament reconstruction     left shoulder surgery     dr . Herbie Baltimore collins    POLYPECTOMY  05/06/2016   Procedure: POLYPECTOMY;  Surgeon: [name], MD;   [facility];  Service: Endoscopy;;  transverse colon polyp, hepatic flexure polyp,    Right shoulder surgery     ROBOT ASSISTED LAPAROSCOPIC NEPHRECTOMY Left 07/04/2018   Procedure: XI ROBOTIC ASSISTED LAPAROSCOPIC RENAL CYST DECORTICATION;  Surgeon: [name], MD;  [facility];  Service: Urology;  Laterality: Left;  3 HRSPROCEDURE: ROBOTIC RENAL CYST DECORTICATION   TONSILLECTOMY     TOTAL KNEE ARTHROPLASTY       Family History  Problem Relation Age of Onset   Pulmonary embolism Mother    Lung disease Father    Hyperlipidemia Brother    Hypertension Brother    Diabetes Other        Significant family h/o DM   Arthritis Sister    Colon cancer Neg Hx     Social History   Social History Narrative   Divorced since 1986,married for 10 years.Lives alone.Part time work,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   Social History   Tobacco Use   Smoking status: Never   Smokeless tobacco: Never  Substance Use Topics   Alcohol use: No    Alcohol/week: 0.0 standard drinks    Current Meds  Medication Sig   amLODipine (NORVASC) 10 MG tablet TAKE 1 TABLET DAILY   Cholecalciferol (VITAMIN D-3) 125 MCG (5000 UT) TABS Take 2 tablets by mouth daily.  flecainide (TAMBOCOR) 50 MG tablet TAKE 1 TABLET TWICE A DAY   furosemide (LASIX) 40 MG tablet Take 40 mg by mouth daily as needed for fluid.    losartan (COZAAR) 100 MG tablet TAKE 1 TABLET DAILY   omeprazole (PRILOSEC) 20 MG capsule TAKE 1 CAPSULE BY MOUTH  DAILY   tamsulosin (FLOMAX) 0.4 MG CAPS capsule TAKE 1 CAPSULE BY MOUTH EVERY DAY (Patient taking differently: 0.4 mg 3 (three) times a week.)   Testosterone 20 % CREA Apply 100 mg topically 2 (two) times daily.     [place] Office Visit from 03/27/2020 in [facility]  PHQ-9 Total Score 0       Objective:   Today's Vitals: BP 133/72 (BP Location: Right Arm, Patient Position: Sitting, Cuff Size: Normal)   Pulse 87   Temp 98 F (36.7 C)  (Temporal)   Resp 18   Ht 6\' 2"  (1.88 m)   Wt 288 lb 3.2 oz (130.7 kg)   SpO2 97%   BMI 37.00 kg/m  Vitals with BMI 10/31/2020 10/09/2020 09/13/2020  Height 6\' 2"  6\' 2"  6\' 2"   Weight 288 lbs 3 oz 281 lbs 10 oz 285 lbs 6 oz  BMI 36.99 48.18 56.31  Systolic 497 026 378  Diastolic 72 85 72  Pulse 87 88 56     Physical Exam  He has increased weight of 7 pounds.  He has some edema, mostly nonpitting.     Assessment   1. Low testosterone   2. Leg swelling       Tests ordered Orders Placed This Encounter  Procedures   Testosterone Total,Free,Bio, Males   COMPLETE METABOLIC PANEL WITH GFR      Plan: 1.  I explained to the patient that testosterone therapy can certainly cause increased edema and since he is getting benefit from the higher dose in terms of energy, he is hesitant to decrease the dose again.  He does have furosemide at hand which he can take and I have recommended he do this for the next 2 to 3 days to reduce the fluid and then watch what happens. 2.  We also had a longer discussion regarding his weight and intermittent fasting and I explained that if he wants to go for prolonged fasting, he should not only ensure adequate water hydration but he should not ensure adequate salt intake with the water.  We will check his electrolytes now. 3.  Since he feels better on testosterone and he took his testosterone today, I am going to check testosterone levels today also.    No orders of the defined types were placed in this encounter.   Doree Albee, MD

## 2020-10-31 NOTE — Progress Notes (Signed)
Been watching what he eats. Buying more large salad, sugar free ice cream . Riding bike more.

## 2020-11-01 LAB — TESTOSTERONE TOTAL,FREE,BIO, MALES
Albumin: 4 g/dL (ref 3.6–5.1)
Sex Hormone Binding: 30 nmol/L (ref 22–77)
Testosterone, Bioavailable: 474.7 ng/dL (ref 110.0–575.0)
Testosterone, Free: 258.1 pg/mL — ABNORMAL HIGH (ref 46.0–224.0)
Testosterone: 1292 ng/dL — ABNORMAL HIGH (ref 250–827)

## 2020-11-01 LAB — COMPLETE METABOLIC PANEL WITH GFR
AG Ratio: 1.4 (calc) (ref 1.0–2.5)
ALT: 10 U/L (ref 9–46)
AST: 15 U/L (ref 10–35)
Albumin: 4 g/dL (ref 3.6–5.1)
Alkaline phosphatase (APISO): 71 U/L (ref 35–144)
BUN/Creatinine Ratio: 7 (calc) (ref 6–22)
BUN: 10 mg/dL (ref 7–25)
CO2: 28 mmol/L (ref 20–32)
Calcium: 9 mg/dL (ref 8.6–10.3)
Chloride: 104 mmol/L (ref 98–110)
Creat: 1.34 mg/dL — ABNORMAL HIGH (ref 0.70–1.25)
GFR, Est African American: 63 mL/min/{1.73_m2} (ref >=60)
GFR, Est Non African American: 54 mL/min/{1.73_m2} — ABNORMAL LOW (ref >=60)
Globulin: 2.8 g/dL (calc) (ref 1.9–3.7)
Glucose, Bld: 92 mg/dL (ref 65–99)
Potassium: 4.3 mmol/L (ref 3.5–5.3)
Sodium: 139 mmol/L (ref 135–146)
Total Bilirubin: 0.7 mg/dL (ref 0.2–1.2)
Total Protein: 6.8 g/dL (ref 6.1–8.1)

## 2020-11-05 ENCOUNTER — Encounter (INDEPENDENT_AMBULATORY_CARE_PROVIDER_SITE_OTHER): Payer: Self-pay | Admitting: Internal Medicine

## 2020-11-05 ENCOUNTER — Telehealth (INDEPENDENT_AMBULATORY_CARE_PROVIDER_SITE_OTHER): Payer: Self-pay

## 2020-11-13 ENCOUNTER — Telehealth (INDEPENDENT_AMBULATORY_CARE_PROVIDER_SITE_OTHER): Payer: Medicare Other | Admitting: Nurse Practitioner

## 2020-11-13 ENCOUNTER — Telehealth (INDEPENDENT_AMBULATORY_CARE_PROVIDER_SITE_OTHER): Payer: Self-pay

## 2020-11-13 ENCOUNTER — Other Ambulatory Visit: Payer: Self-pay

## 2020-11-13 ENCOUNTER — Encounter (INDEPENDENT_AMBULATORY_CARE_PROVIDER_SITE_OTHER): Payer: Self-pay | Admitting: Nurse Practitioner

## 2020-11-13 VITALS — Ht 74.0 in

## 2020-11-13 DIAGNOSIS — A6920 Lyme disease, unspecified: Secondary | ICD-10-CM | POA: Diagnosis not present

## 2020-11-13 MED ORDER — DOXYCYCLINE HYCLATE 100 MG PO TABS: 100.0000 mg | ORAL_TABLET | Freq: Two times a day (BID) | ORAL | 0 refills | Status: DC

## 2020-11-13 NOTE — Telephone Encounter (Signed)
Patient called and stated that he found a tick on the back of his knee last Wednesday, 11/06/2020 and started having body aches and feeling like he has had a low grade fever daily and these symptoms started on Sunday/Monday. Patient has been taking Tylenol/Ibuprofen and states that his body aches are worse in the evenings. Patient does not have a Covid test to take and scheduled him for a telephone visit at 2:20pm today. Patient verbalized an understanding and thanked [place]. Sending as Juluis Rainier.

## 2020-11-13 NOTE — Progress Notes (Signed)
Due to national recommendations of social distancing related to the [place] pandemic, an audio-only tele-health visit was felt to be the most appropriate encounter type for this patient today. I connected with  Jonathan Dyer on 11/13/20 utilizing audio-only technology and verified that I am speaking with the correct person using two identifiers. The patient was located at their home, and I was located at the office of [facility] during the encounter. I discussed the limitations of evaluation and management by telemedicine. The patient expressed understanding and agreed to proceed.    Subjective:  [name], male    DOB: 18-May-1955  Age: 64 y.o. MRN: [id number]  CC:  Chief Complaint  Patient presents with   Generalized Body Aches    Patient pulled a tick off the back of his knee last Wednesday, 11/06/2020 and started having symptoms on Sunday/Monday of body aches and low grade fever. Patient stated that he feels worse in the evenings and not sure if from tick bite.      HPI  This patient arrives today for virtual visit for the above.  He tells me he started experiencing some generalized muscle aches approximately 3 days ago.  They have been mild but have been persistent so he was concerned about this.  As far as he knows he has not been exposed anyone sick nor to anyone with COVID-19 infection.  He is immunized against COVID-19 with 2 vaccines but has not gotten a booster.  He has been taking Tylenol and ibuprofen as needed to treat his muscle aches.  He tells me he is felt feverish but has not actually checked his temperature to verify whether or not he has been running a fever.  He also has not taken any kind of COVID-19 test.  He did find a small tick on the back of his knee last week, but is not positive of the identification of the type of tick.  Denies any rash.  Past Medical History:  Diagnosis Date   Atrial fibrillation ([place])    Diagnosed  09/2009, on flecainide   Bradycardia    Event monitor 2010: HR down to 45, asymptomatic.   Coronary atherosclerosis of native coronary artery    Nonobstructive and distal disease by cath 2005.    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   Social History   Tobacco Use   Smoking status: Never   Smokeless tobacco: Never  Substance Use Topics   Alcohol use: No    Alcohol/week: 0.0 standard drinks     Current Meds  Medication Sig   amLODipine (NORVASC) 10 MG tablet TAKE 1 TABLET DAILY   Cholecalciferol (VITAMIN D-3) 125 MCG (5000 UT) TABS Take 2 tablets by mouth daily.   doxycycline (VIBRA-TABS) 100 MG tablet Take 1 tablet (100 mg total) by mouth 2 (two) times daily.  flecainide (TAMBOCOR) 50 MG tablet TAKE 1 TABLET TWICE A DAY   furosemide (LASIX) 40 MG tablet Take 40 mg by mouth daily as needed for fluid.    losartan (COZAAR) 100 MG tablet TAKE 1 TABLET DAILY   omeprazole (PRILOSEC) 20 MG capsule TAKE 1 CAPSULE BY MOUTH  DAILY   tamsulosin (FLOMAX) 0.4 MG CAPS capsule TAKE 1 CAPSULE BY MOUTH EVERY DAY (Patient taking differently: 0.4 mg 3 (three) times a week.)   Testosterone 20 % CREA Apply 100 mg topically 2 (two) times daily.    ROS:  Review of Systems  Constitutional:  Positive for chills. Negative for diaphoresis.  Respiratory:  Negative for cough, shortness of breath and wheezing.   Cardiovascular:  Negative for chest pain.  Gastrointestinal:  Positive for diarrhea (11/12/20; may  have been r/t food). Negative for nausea and vomiting.  Musculoskeletal:  Positive for myalgias.  Skin:  Negative for rash.  Neurological:  Negative for dizziness and headaches.    Objective:   Today's Vitals: Ht 6' 2" (1.88 m)   BMI 37.00 kg/m  Vitals with BMI 11/13/2020 10/31/2020 10/09/2020  Height 6' 2" 6' 2" 6' 2"  Weight (No Data) 288 lbs 3 oz 281 lbs 10 oz  BMI - 92.42 68.34  Systolic (No Data) 196 222  Diastolic (No Data) 72 85  Pulse (No Data) 87 88     Physical Exam Comprehensive physical exam not completed today as office visit was conducted remotely.  Patient sounded well over the phone.  Patient was alert and oriented, and appeared to have appropriate judgment.       Assessment and Plan   1. Lyme disease      Plan: 1.  We will treat empirically for Lyme disease.  I did recommend that he get a COVID-19 at home antigen test to rule out this infection, and he will consider this.  We will start him on doxycycline 100 mg twice a day and he will take this for 2 weeks.  I did tell him if symptoms worsen or do not improve over the next 2 weeks he needs to notify this office and we will have to come in for in person visit for further evaluation.  He tells me he understands.  We did discuss side effect of photosensitivity and risk for severe sunburns and he was told to stay out of the sun as much as possible while on this medication.  He tells me he understands.  We also discussed extensively his kidney function as he is concerned about his last lab results.  We discussed overall kidney health and that if his EGFR were to continue to decline we would refer him to a nephrologist.  He tells me he understands.   Tests ordered No orders of the defined types were placed in this encounter.     Meds ordered this encounter  Medications   doxycycline (VIBRA-TABS) 100 MG tablet    Sig: Take 1 tablet (100 mg total) by mouth 2 (two) times daily.    Dispense:  28 tablet    Refill:   0    Order Specific Question:   Supervising Provider    Answer:   Doree Albee [[id number]]    Patient to follow-up in September as scheduled or sooner as needed.  Total time spent on telephone today was 21 minutes and 15 seconds.  Ailene Ards, NP

## 2020-11-18 IMAGING — CT CT ABDOMEN AND PELVIS WITH CONTRAST
2 of 5 series · 15 of 46 positions shown, 17 images · IV contrast (Isovue)
Comparison: Comparison made with previous CT from 01/12/2018

CLINICAL DATA: Initial evaluation for acute left upper quadrant
abdominal pain.

EXAM:
CT ABDOMEN AND PELVIS WITH CONTRAST
TECHNIQUE: Multidetector CT imaging of the abdomen and pelvis was performed
using the standard protocol following bolus administration of
intravenous contrast.
CONTRAST:  100mL OMNIPAQUE IOHEXOL 300 MG/ML  SOLN

[Series 2: axial st · axial · 0.88mm/px · z∈[+958,+1393]mm · 12 of 101 slices shown, 14 images]
[im 7/101  soft-tissue]
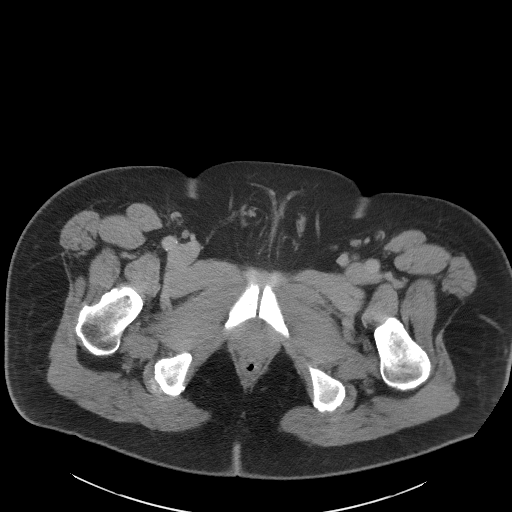
[im 7/101  bone]
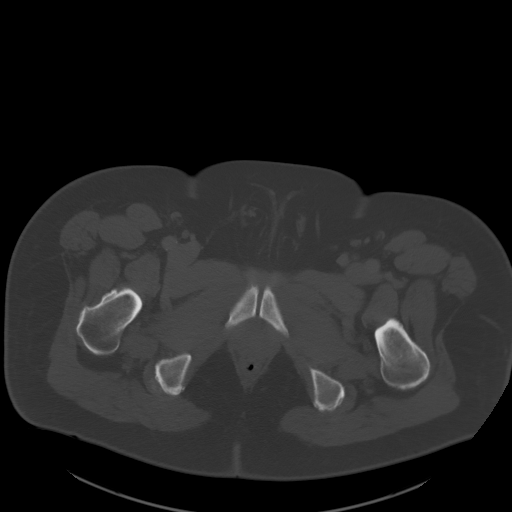
[im 14/101  soft-tissue]
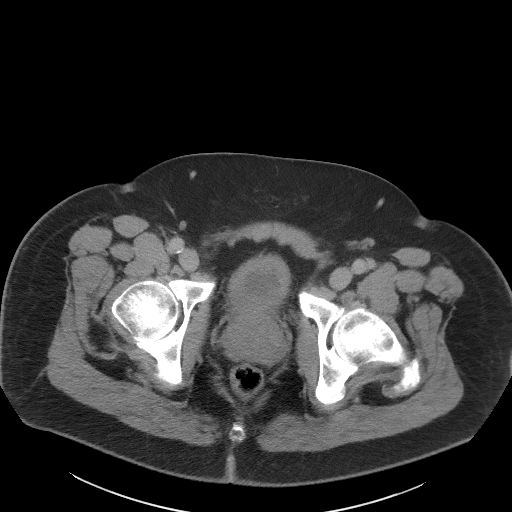
[im 21/101  soft-tissue]
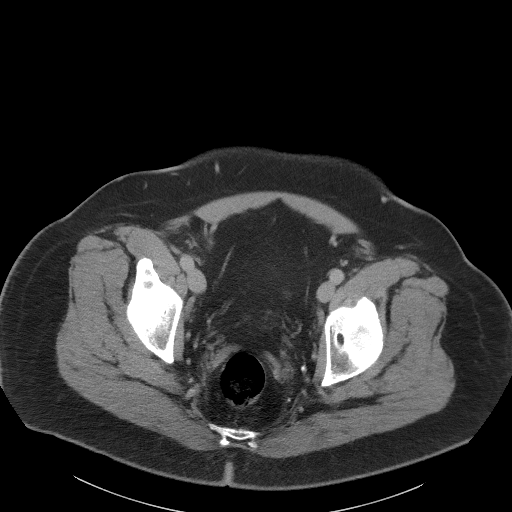
[im 34/101  soft-tissue]
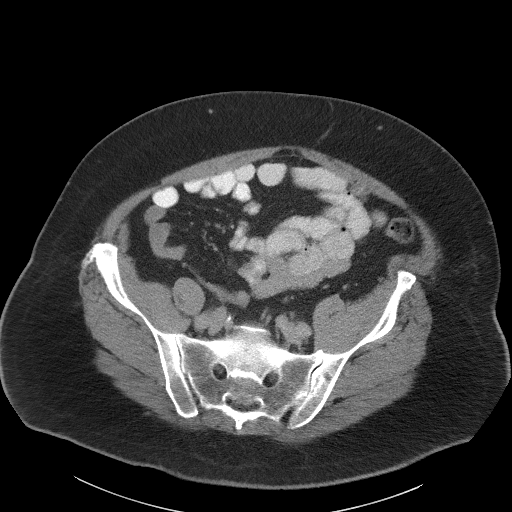
[im 41/101  soft-tissue]
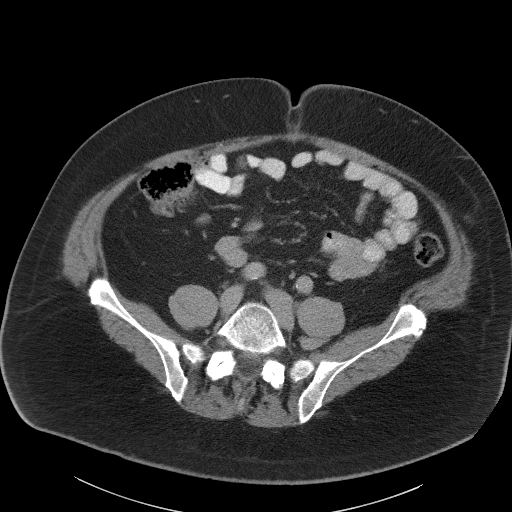
[im 47/101  soft-tissue]
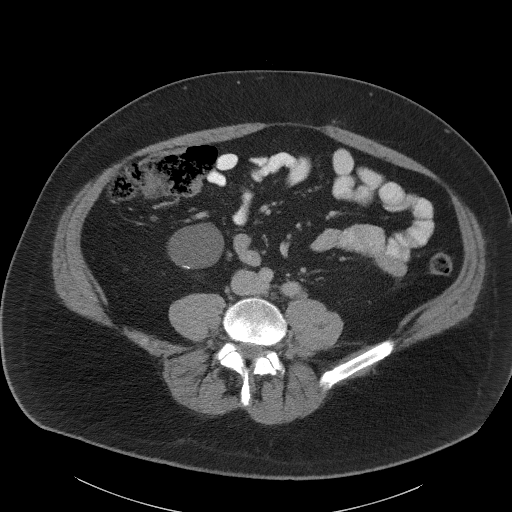
[im 54/101  soft-tissue]
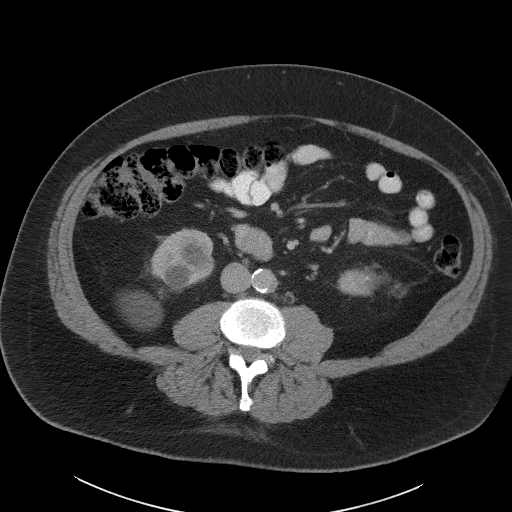
[im 61/101  soft-tissue]
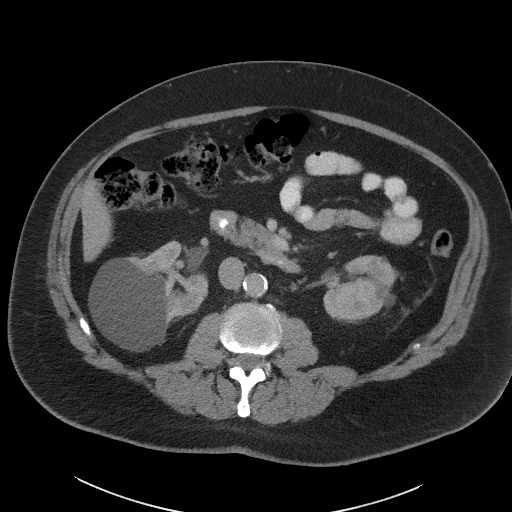
[im 67/101  soft-tissue]
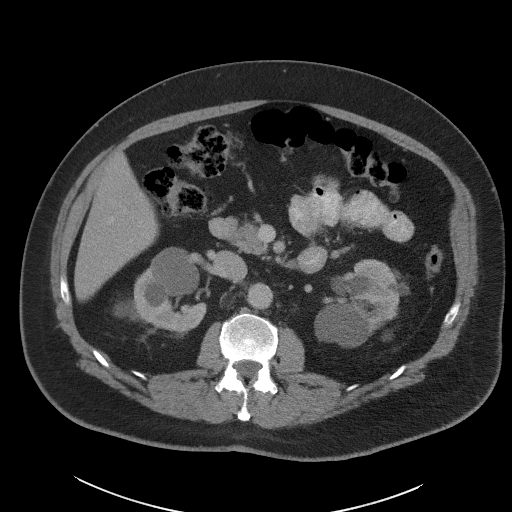
[im 67/101  bone]
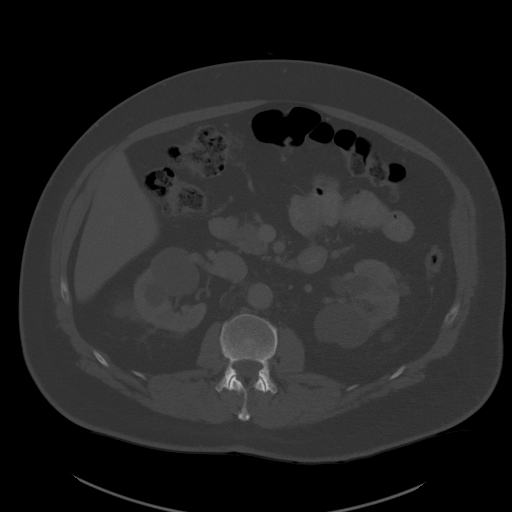
[im 81/101  soft-tissue]
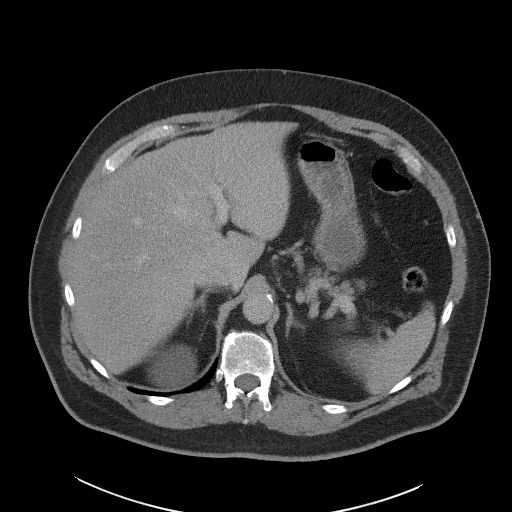
[im 87/101  soft-tissue]
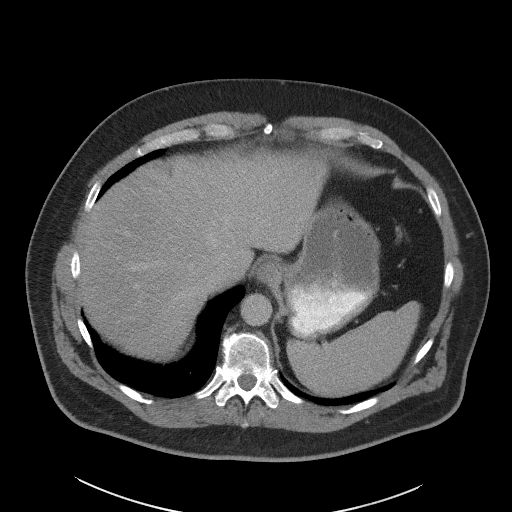
[im 94/101  soft-tissue]
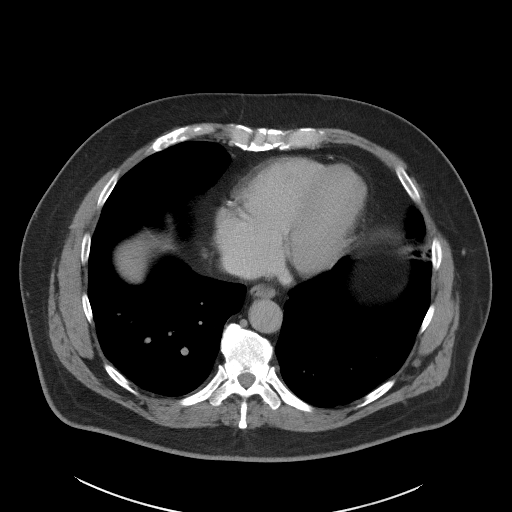

[Series 5: coronal st · coronal · 0.88mm/px · 3 of 115 slices shown]
[im 39/115  soft-tissue]
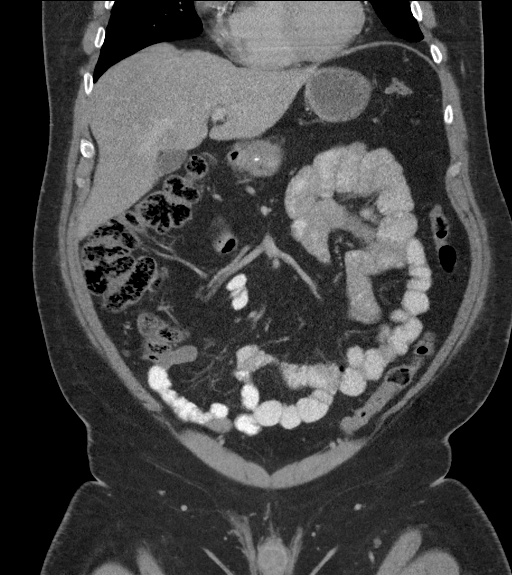
[im 51/115  soft-tissue]
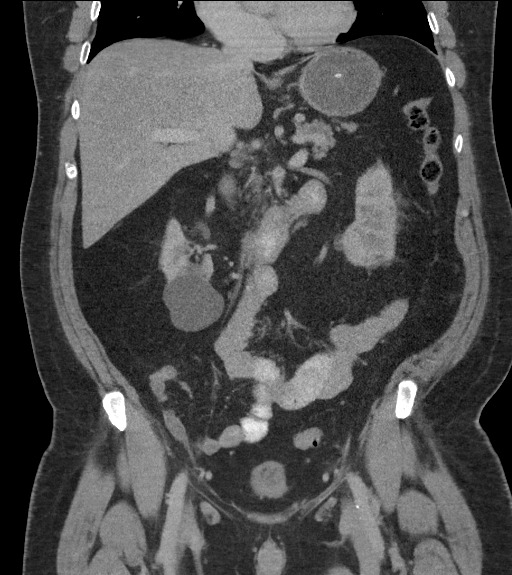
[im 64/115  soft-tissue]
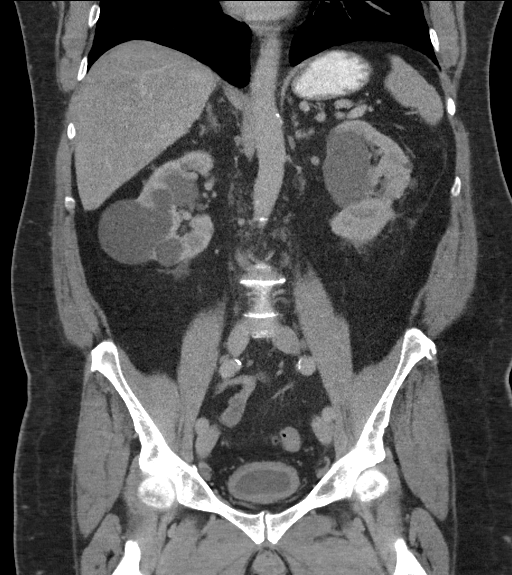

[15 of 46 positions shown; findings below may reference images not displayed]

FINDINGS: Lower chest: Mild scattered subsegmental atelectatic changes seen
dependently within the visualized lung bases. Visualized lung bases
are otherwise clear.

Hepatobiliary: Liver demonstrates a normal contrast enhanced
appearance. Gallbladder within normal limits. No biliary dilatation.

Pancreas: Approximate 12 mm cystic lesion within the pancreatic body
no, grossly stable from previous MRI. Pancreas otherwise
unremarkable without acute inflammatory changes or abnormal
pancreatic ductal dilatation.

Spleen: Spleen within normal limits.

Adrenals/Urinary Tract: Adrenal glands are normal. Kidneys fairly
equal in size with symmetric enhancement. Multiple scattered renal
cysts again seen bilaterally, largest of which on the right measures
8.7 cm and is position within the interpolar region. Largest on the
left measures 6.9 cm and arises from the upper pole. Small amount of
layering calcification noted within a cyst at the lower pole of the
right kidney, stable. No concerning features or focal enhancing
renal mass. Postsurgical changes present at the lower pole the left
kidney. No nephrolithiasis or obstructive uropathy. No hydroureter.
Circumferential bladder wall thickening noted about the partially
distended bladder, most like related to chronic outlet obstruction.
Bladder otherwise unremarkable.

Stomach/Bowel: Stomach within normal limits. No evidence for bowel
obstruction. Normal appendix. Distal colon largely decompressed. No
abnormal wall thickening, mucosal enhancement, or inflammatory fat
stranding seen about the bowels.

Vascular/Lymphatic: Moderate aorto bi-iliac atherosclerotic disease
present. No aneurysm. Mesenteric vessels patent proximally. No
pathologically enlarged intra-abdominal or pelvic lymph nodes.

Reproductive: Enlarged prostate measures 6 cm in transverse
diameter. Seminal vesicles within normal limits.

Other: No free air or fluid. Small fat containing paraumbilical
hernia noted without associated inflammation. Postsurgical scarring
noted within the subcutaneous fat of the left anterolateral abdomen.

Musculoskeletal: No acute osseous finding. No discrete lytic or
blastic osseous lesions.
IMPRESSION: 1. No CT evidence for acute intra-abdominal or pelvic process. No
findings to explain patient's symptoms identified.
2. Multiple bilateral renal cysts as above, similar to previous
exams.
3. 12 mm cystic lesion within the pancreatic body, stable from
previous MRI. Follow-up recommendations as previously described on
prior MRI from 02/16/2018
[DATE]. Enlarged prostate. Circumferential bladder wall thickening
similar to previous exams, and most likely related to chronic outlet
obstruction.
5. Moderate aorto bi-iliac atherosclerotic disease. No aneurysm.

## 2020-12-03 DIAGNOSIS — Z20822 Contact with and (suspected) exposure to covid-19: Secondary | ICD-10-CM | POA: Diagnosis not present

## 2020-12-09 DIAGNOSIS — Z20822 Contact with and (suspected) exposure to covid-19: Secondary | ICD-10-CM | POA: Diagnosis not present
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
## 2020-12-10 ENCOUNTER — Ambulatory Visit (INDEPENDENT_AMBULATORY_CARE_PROVIDER_SITE_OTHER): Payer: Medicare Other | Admitting: Cardiology

## 2020-12-10 ENCOUNTER — Other Ambulatory Visit: Payer: Self-pay

## 2020-12-10 ENCOUNTER — Encounter: Payer: Self-pay | Admitting: Cardiology

## 2020-12-10 VITALS — BP 156/84 | HR 70 | Ht 74.0 in | Wt 288.0 lb

## 2020-12-10 DIAGNOSIS — I10 Essential (primary) hypertension: Secondary | ICD-10-CM

## 2020-12-10 DIAGNOSIS — I2510 Atherosclerotic heart disease of native coronary artery without angina pectoris: Secondary | ICD-10-CM

## 2020-12-10 DIAGNOSIS — I480 Paroxysmal atrial fibrillation: Secondary | ICD-10-CM | POA: Diagnosis not present

## 2020-12-10 NOTE — Patient Instructions (Signed)

## 2020-12-20 DIAGNOSIS — R3912 Poor urinary stream: Secondary | ICD-10-CM | POA: Diagnosis not present

## 2020-12-20 DIAGNOSIS — E291 Testicular hypofunction: Secondary | ICD-10-CM | POA: Diagnosis not present

## 2020-12-20 DIAGNOSIS — N401 Enlarged prostate with lower urinary tract symptoms: Secondary | ICD-10-CM | POA: Diagnosis not present

## 2020-12-20 DIAGNOSIS — N281 Cyst of kidney, acquired: Secondary | ICD-10-CM | POA: Diagnosis not present

## 2020-12-20 DIAGNOSIS — R972 Elevated prostate specific antigen [PSA]: Secondary | ICD-10-CM | POA: Diagnosis not present

## 2020-12-24 ENCOUNTER — Telehealth: Payer: Self-pay | Admitting: Cardiology

## 2020-12-24 DIAGNOSIS — Z79899 Other long term (current) drug therapy: Secondary | ICD-10-CM

## 2020-12-24 MED ORDER — CHLORTHALIDONE 25 MG PO TABS: 12.5000 mg | ORAL_TABLET | Freq: Every day | ORAL | 3 refills | Status: DC

## 2020-12-24 MED ORDER — POTASSIUM CHLORIDE ER 10 MEQ PO TBCR: 10.0000 meq | EXTENDED_RELEASE_TABLET | Freq: Every day | ORAL | 3 refills | Status: DC

## 2020-12-24 NOTE — Telephone Encounter (Signed)
Patient has been taking BP's but does not know what diastolic numbers are as he does not pay attention. His systolic numbers have been in the 150's. One exception was after riding his bike for 50 miles, his systolic BP was 0000000.    He states he had previously taken chlorthalidone 12.5 mg and potassium.

## 2020-12-24 NOTE — Telephone Encounter (Signed)
New message     Patient is leaving for vacation tomorrow to go cross country but his BP is running high  163/? Can he start taking chlorthaidone and potassium to drive the blood pressure back down .   If so he will need a new prescription sent to [facility] in [facility] message on vm he is at work and he will call you back,.

## 2020-12-24 NOTE — Telephone Encounter (Signed)
I spoke with patient. He will start chlorthalidone 12.5 mg daily and potassium 10 meq daily. BMET order entered.

## 2020-12-26 ENCOUNTER — Ambulatory Visit: Payer: Medicare Other | Admitting: Cardiology

## 2021-01-06 ENCOUNTER — Telehealth: Payer: Self-pay | Admitting: Cardiology

## 2021-01-06 NOTE — Telephone Encounter (Signed)
Patient notified and verbalized understanding.  Stated that he is out in [place] now, will let us know if decides to get labs done.  Encouraged Gatorade as well.

## 2021-01-06 NOTE — Telephone Encounter (Signed)
Patient called stating that he is on a cross country bike ride. States that a week ago he rode with no problems and the same on Wednesday. On 01-04-2021 he was only able to ride 1/2 mile when his legs started cramping bad. He is in question of the medication Chlorthalidone and potassium. Could these be causing the cramping. States he is drinking a lot of fluids and eating 2 bananas daily.  Please leave a message on phone if unable to contact patient.

## 2021-01-20 ENCOUNTER — Telehealth: Payer: Self-pay | Admitting: Internal Medicine

## 2021-01-20 NOTE — Telephone Encounter (Signed)
Letter mailed

## 2021-01-20 NOTE — Telephone Encounter (Signed)
Recall for mri °

## 2021-01-22 ENCOUNTER — Telehealth: Payer: Self-pay | Admitting: Internal Medicine

## 2021-01-22 DIAGNOSIS — Z20822 Contact with and (suspected) exposure to covid-19: Secondary | ICD-10-CM | POA: Diagnosis not present

## 2021-01-22 DIAGNOSIS — K862 Cyst of pancreas: Secondary | ICD-10-CM

## 2021-01-22 NOTE — Addendum Note (Signed)
Addended by: Zara Council C on: 01/22/2021 10:28 AM   Modules accepted: Orders

## 2021-01-22 NOTE — Telephone Encounter (Signed)
PATIENT ON RECALL FOR MRI, CALLED TO TELL us THAT HE NEEDS A Monday OR Tuesday AND WOULD LIKE TO HAVE IT DONE IN October

## 2021-01-22 NOTE — Telephone Encounter (Signed)
MRI abd w/wo contrast scheduled for 02/10/21 at 12:00pm, arrive at 11:30am. NPO 4 hours prior to test.   Called and informed pt of MRI appt. Letter mailed.

## 2021-02-04 ENCOUNTER — Ambulatory Visit (INDEPENDENT_AMBULATORY_CARE_PROVIDER_SITE_OTHER): Payer: Medicare Other | Admitting: Internal Medicine

## 2021-02-04 ENCOUNTER — Other Ambulatory Visit (HOSPITAL_COMMUNITY)
Admission: RE | Admit: 2021-02-04 | Discharge: 2021-02-04 | Disposition: A | Discharge status: Home / Self Care | Payer: Medicare Other | Source: Ambulatory Visit | Attending: Cardiology | Admitting: Cardiology

## 2021-02-04 ENCOUNTER — Other Ambulatory Visit: Payer: Self-pay

## 2021-02-04 DIAGNOSIS — Z79899 Other long term (current) drug therapy: Secondary | ICD-10-CM | POA: Insufficient documentation

## 2021-02-04 LAB — BASIC METABOLIC PANEL
Anion gap: 7 (ref 5–15)
BUN: 23 mg/dL (ref 8–23)
CO2: 26 mmol/L (ref 22–32)
Calcium: 8.6 mg/dL — ABNORMAL LOW (ref 8.9–10.3)
Chloride: 103 mmol/L (ref 98–111)
Creatinine, Ser: 1.31 mg/dL — ABNORMAL HIGH (ref 0.61–1.24)
GFR, Estimated: 60 mL/min — ABNORMAL LOW (ref >60)
Glucose, Bld: 131 mg/dL — ABNORMAL HIGH (ref 70–99)
Potassium: 3.9 mmol/L (ref 3.5–5.1)
Sodium: 136 mmol/L (ref 135–145)

## 2021-02-10 ENCOUNTER — Other Ambulatory Visit: Payer: Self-pay

## 2021-02-10 ENCOUNTER — Other Ambulatory Visit: Payer: Self-pay | Admitting: Gastroenterology

## 2021-02-10 ENCOUNTER — Ambulatory Visit (HOSPITAL_COMMUNITY)
Admission: RE | Admit: 2021-02-10 | Discharge: 2021-02-10 | Disposition: A | Discharge status: Home / Self Care | Payer: Medicare Other | Source: Ambulatory Visit | Attending: Gastroenterology | Admitting: Gastroenterology

## 2021-02-10 DIAGNOSIS — K862 Cyst of pancreas: Secondary | ICD-10-CM

## 2021-02-10 DIAGNOSIS — N281 Cyst of kidney, acquired: Secondary | ICD-10-CM | POA: Diagnosis not present

## 2021-02-10 MED ORDER — GADOBUTROL 1 MMOL/ML IV SOLN
10.0000 mL | Freq: Once | INTRAVENOUS | Status: AC | PRN
  Administered 2021-02-10: 10 mL via INTRAVENOUS

## 2021-02-19 DIAGNOSIS — L820 Inflamed seborrheic keratosis: Secondary | ICD-10-CM | POA: Diagnosis not present

## 2021-02-19 DIAGNOSIS — C44329 Squamous cell carcinoma of skin of other parts of face: Secondary | ICD-10-CM | POA: Diagnosis not present

## 2021-02-19 DIAGNOSIS — L814 Other melanin hyperpigmentation: Secondary | ICD-10-CM | POA: Diagnosis not present

## 2021-02-19 DIAGNOSIS — D225 Melanocytic nevi of trunk: Secondary | ICD-10-CM | POA: Diagnosis not present

## 2021-02-19 DIAGNOSIS — L570 Actinic keratosis: Secondary | ICD-10-CM | POA: Diagnosis not present

## 2021-02-19 DIAGNOSIS — L821 Other seborrheic keratosis: Secondary | ICD-10-CM | POA: Diagnosis not present

## 2021-02-19 DIAGNOSIS — Z85828 Personal history of other malignant neoplasm of skin: Secondary | ICD-10-CM | POA: Diagnosis not present

## 2021-03-03 DIAGNOSIS — R972 Elevated prostate specific antigen [PSA]: Secondary | ICD-10-CM | POA: Diagnosis not present

## 2021-03-11 DIAGNOSIS — R3912 Poor urinary stream: Secondary | ICD-10-CM | POA: Diagnosis not present

## 2021-03-11 DIAGNOSIS — R972 Elevated prostate specific antigen [PSA]: Secondary | ICD-10-CM | POA: Diagnosis not present

## 2021-03-11 DIAGNOSIS — N401 Enlarged prostate with lower urinary tract symptoms: Secondary | ICD-10-CM | POA: Diagnosis not present

## 2021-03-11 DIAGNOSIS — N281 Cyst of kidney, acquired: Secondary | ICD-10-CM | POA: Diagnosis not present

## 2021-03-11 DIAGNOSIS — E291 Testicular hypofunction: Secondary | ICD-10-CM | POA: Diagnosis not present

## 2021-03-18 ENCOUNTER — Encounter: Payer: Self-pay | Admitting: Internal Medicine

## 2021-03-31 ENCOUNTER — Encounter: Payer: Self-pay | Admitting: Family Medicine

## 2021-03-31 ENCOUNTER — Ambulatory Visit (INDEPENDENT_AMBULATORY_CARE_PROVIDER_SITE_OTHER): Payer: Medicare Other | Admitting: Family Medicine

## 2021-03-31 VITALS — BP 128/78 | HR 85 | Temp 98.2°F | Resp 16 | Ht 74.0 in | Wt 277.2 lb

## 2021-03-31 DIAGNOSIS — E785 Hyperlipidemia, unspecified: Secondary | ICD-10-CM | POA: Diagnosis not present

## 2021-03-31 DIAGNOSIS — I10 Essential (primary) hypertension: Secondary | ICD-10-CM | POA: Diagnosis not present

## 2021-03-31 DIAGNOSIS — I480 Paroxysmal atrial fibrillation: Secondary | ICD-10-CM

## 2021-03-31 DIAGNOSIS — Z23 Encounter for immunization: Secondary | ICD-10-CM | POA: Diagnosis not present

## 2021-03-31 DIAGNOSIS — E559 Vitamin D deficiency, unspecified: Secondary | ICD-10-CM

## 2021-03-31 DIAGNOSIS — I2510 Atherosclerotic heart disease of native coronary artery without angina pectoris: Secondary | ICD-10-CM | POA: Diagnosis not present

## 2021-03-31 NOTE — Patient Instructions (Addendum)
Keep up the good work with weight loss.  If continued naps needed during day, should consider repeat sleep test.   Blood pressure looks good today.   I will check some fasting labs - please schedule fasting lab visit in next 2 weeks.   See foods to avoid for heartburn.   Food Choices for Gastroesophageal Reflux Disease, Adult When you have gastroesophageal reflux disease (GERD), the foods you eat and your eating habits are very important. Choosing the right foods can help ease your discomfort. Think about working with a food expert (dietitian) to help you make good choices. What are tips for following this plan? Reading food labels Look for foods that are low in saturated fat. Foods that may help with your symptoms include: Foods that have less than 5% of daily value (DV) of fat. Foods that have 0 grams of trans fat. Cooking Do not fry your food. Cook your food by baking, steaming, grilling, or broiling. These are all methods that do not need a lot of fat for cooking. To add flavor, try to use herbs that are low in spice and acidity. Meal planning  Choose healthy foods that are low in fat, such as: Fruits and vegetables. Whole grains. Low-fat dairy products. Lean meats, fish, and poultry. Eat small meals often instead of eating 3 large meals each day. Eat your meals slowly in a place where you are relaxed. Avoid bending over or lying down until 2-3 hours after eating. Limit high-fat foods such as fatty meats or fried foods. Limit your intake of fatty foods, such as oils, butter, and shortening. Avoid the following as told by your doctor: Foods that cause symptoms. These may be different for different people. Keep a food diary to keep track of foods that cause symptoms. Alcohol. Drinking a lot of liquid with meals. Eating meals during the 2-3 hours before bed. Lifestyle Stay at a healthy weight. Ask your doctor what weight is healthy for you. If you need to lose weight, work with  your doctor to do so safely. Exercise for at least 30 minutes on 5 or more days each week, or as told by your doctor. Wear loose-fitting clothes. Do not smoke or use any products that contain nicotine or tobacco. If you need help quitting, ask your doctor. Sleep with the head of your bed higher than your feet. Use a wedge under the mattress or blocks under the bed frame to raise the head of the bed. Chew sugar-free gum after meals. What foods should eat? Eat a healthy, well-balanced diet of fruits, vegetables, whole grains, low-fat dairy products, lean meats, fish, and poultry. Each person is different. Foods that may cause symptoms in one person may not cause any symptoms in another person. Work with your doctor to find foods that are safe for you. The items listed above may not be a complete list of what you can eat and drink. Contact a food expert for more options. What foods should I avoid? Limiting some of these foods may help in managing the symptoms of GERD. Everyone is different. Talk with a food expert or your doctor to help you find the exact foods to avoid, if any. Fruits Any fruits prepared with added fat. Any fruits that cause symptoms. For some people, this may include citrus fruits, such as oranges, grapefruit, pineapple, and lemons. Vegetables Deep-fried vegetables. [place] fries. Any vegetables prepared with added fat. Any vegetables that cause symptoms. For some people, this may include tomatoes and tomato products,  chili peppers, onions and garlic, and horseradish. Grains Pastries or quick breads with added fat. Meats and other proteins High-fat meats, such as fatty beef or pork, hot dogs, ribs, ham, sausage, salami, and bacon. Fried meat or protein, including fried fish and fried chicken. Nuts and nut butters, in large amounts. Dairy Whole milk and chocolate milk. Sour cream. Cream. Ice cream. Cream cheese. Milkshakes. Fats and oils Butter. Margarine. Shortening.  Ghee. Beverages Coffee and tea, with or without caffeine. Carbonated beverages. Sodas. Energy drinks. Fruit juice made with acidic fruits, such as orange or grapefruit. Tomato juice. Alcoholic drinks. Sweets and desserts Chocolate and cocoa. Donuts. Seasonings and condiments Pepper. Peppermint and spearmint. Added salt. Any condiments, herbs, or seasonings that cause symptoms. For some people, this may include curry, hot sauce, or vinegar-based salad dressings. The items listed above may not be a complete list of what you should not eat and drink. Contact a food expert for more options. Questions to ask your doctor Diet and lifestyle changes are often the first steps that are taken to manage symptoms of GERD. If diet and lifestyle changes do not help, talk with your doctor about taking medicines. Where to find more information International Foundation for Gastrointestinal Disorders: aboutgerd.org Summary When you have GERD, food and lifestyle choices are very important in easing your symptoms. Eat small meals often instead of 3 large meals a day. Eat your meals slowly and in a place where you are relaxed. Avoid bending over or lying down until 2-3 hours after eating. Limit high-fat foods such as fatty meats or fried foods. This information is not intended to replace advice given to you by your health care provider. Make sure you discuss any questions you have with your health care provider. Document Revised: 11/06/2019 Document Reviewed: 11/06/2019 Elsevier Patient Education  [place].

## 2021-03-31 NOTE — Progress Notes (Signed)
Subjective:  [name], male    DOB: 05/01/1956  Age: 65 y.o. MRN: [id number]  CC:  Chief Complaint  Patient presents with   New Patient (Initial Visit)    Pt reports here for new care, last provider retired then Dr Anastasio Champion has passed, denied chest pain and SOB    HPI Jonathan Dyer presents for   New patient to establish care.  Previous primary care provider [name]. 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    Cardiac: Cardiologist Dr. Domenic Polite. History of CAD, paroxysmal atrial fibrillation, OSA, hypertension, hyperlipidemia. Treated with flecainide for antiarrhythmic, ASA for blood thinner.  Cath in 2005 - nonobstructive and distal disease. No PTCA, [place] CABG.  No CP, dyspnea. [place]. Working on weight loss to help with climbs. 15-30 miles when riding.   HTN: Chlorthalidone added in August. Continued losartan, potassium, amlodipine with hypertension.  Furosemide as needed for ankle swelling. Every 2-3 weeks.  BP Readings from Last 3 Encounters:  03/31/21 128/78  12/10/20 (!) 156/84  10/31/20 133/72  Home readings: 130/60-70 range. No new side effects.   Obstructive Sleep Apnea:  Home sleep test - borderline, Tried CPAP in past - could not tolerate multiple mask options.  Feels well rested. Occasional.   Weight down 15# in past 5 weeks - weight loss challenge.  Goal weight of 250 by March 2023.  Wt Readings from Last 3 Encounters:  03/31/21 277 lb 3.2 oz (125.7 kg)  12/10/20 288 lb (130.6 kg)  10/31/20 288 lb 3.2 oz (130.7 kg)   Low vitamin D Only taking MVI at this time, and coQ10  Low of 24 in May 2021.  Last vitamin D Lab Results  Component Value Date   VD25OH 34 12/27/2019    Hyperlipidemia: Not on statin. Hx of statin, but off for 4 years  - improved with weight loss. Ate 4.5 hrs.  Lab Results  Component Value Date   CHOL 207 (H) 10/03/2019   HDL 41 10/03/2019    LDLCALC 131 (H) 10/03/2019   TRIG 208 (H) 10/03/2019   CHOLHDL 5.0 (H) 10/03/2019   Lab Results  Component Value Date   ALT 10 10/31/2020   AST 15 10/31/2020   ALKPHOS 108 04/21/2019   BILITOT 0.7 10/31/2020   Occasional omeprazole for heartburn - not daily and food dependent.   Flu vaccine today.   Had covid vaccine x2, no booster, no documented infection.   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   Screen for colon cancer 12/29/2017   Arthralgia of right foot 08/17/2017   Degeneration of lumbar intervertebral disc 08/04/2017   Osteoarthritis of right knee 07/20/2017   Spinal stenosis 07/20/2017   Hypertensive disorder 01/01/2016   Hx of adenomatous colonic polyps 03/01/2015   Pain in joint, shoulder region 01/26/2012   Impingement syndrome of left shoulder 01/26/2012   Muscle weakness (generalized) 01/26/2012   Hyperlipidemia, mixed 10/11/2009   Obstructive sleep apnea 10/11/2009   PAROXYSMAL ATRIAL FIBRILLATION 10/11/2009   CAD (coronary artery disease), native coronary artery 08/21/2009   Benign  essential hypertension 11/09/2008   Past Medical History:  Diagnosis Date   Allergy    Arthritis    Atrial fibrillation ([place])    Diagnosed 09/2009, on flecainide   Bradycardia    Event monitor 2010: HR down to 45, asymptomatic.   Cataract    Coronary atherosclerosis of native coronary artery    Nonobstructive and distal disease by cath 2005.    Essential hypertension    Hyperlipidemia    OSA (obstructive sleep apnea)    Renal cyst    Ringing in ears    Sleep apnea    Past  Surgical History:  Procedure Laterality Date   BIOPSY  01/04/2018   Procedure: BIOPSY;  Surgeon: [name], MD;  [facility];  Service: Endoscopy;;  ascending colon    CARDIAC CATHETERIZATION  2005   COLONOSCOPY  05/2006   SLF: 3 mm sigmoid: Tubular adenoma removed.   COLONOSCOPY N/A 05/06/2016   Dr. [name]: two sessile polyps in mid transverse colon and hepatic flexure (tubular adenomas). ext/int hemorrhoids. tortuous left colon.    COLONOSCOPY WITH PROPOFOL N/A 01/04/2018   Procedure: COLONOSCOPY WITH PROPOFOL;  Surgeon: [name], MD;  Location: [facility];  Service: Endoscopy;  Laterality: N/A;  2:30pm   KNEE ARTHROSCOPY     Left anterior cruciate ligament reconstruction     left shoulder surgery     dr . Herbie [place] collins    POLYPECTOMY  05/06/2016   Procedure: POLYPECTOMY;  Surgeon: [name], MD;  Location: [facility];  Service: Endoscopy;;  transverse colon polyp, hepatic flexure polyp,    Right shoulder surgery     ROBOT ASSISTED LAPAROSCOPIC NEPHRECTOMY Left 07/04/2018   Procedure: XI ROBOTIC ASSISTED LAPAROSCOPIC RENAL CYST DECORTICATION;  Surgeon: [name], MD;  Location: [facility];  Service: Urology;  Laterality: Left;  3 HRSPROCEDURE: ROBOTIC RENAL CYST DECORTICATION   TONSILLECTOMY     TOTAL KNEE ARTHROPLASTY     Allergies  Allergen Reactions   Clindamycin/Lincomycin Swelling and Other (See Comments)    Mycin drug family    Penicillins Other (See Comments)    Has patient had a PCN reaction causing immediate rash, facial/tongue/throat swelling, SOB or lightheadedness with hypotension: unknown Has patient had a PCN reaction causing severe rash involving mucus membranes or skin necrosis: unknown Has patient had a PCN reaction that required hospitalization unknown Has patient had a PCN reaction occurring within the last 10 years: unknown If all of the above answers are "NO", then may proceed    Pneumococcal 13-Val Conj Vacc Swelling     Patient stated that he has severe swelling in his arm from vaccine and swollen up to his collar bone. Lasted about 2 days and did not feel well.    Prior to Admission medications   Medication Sig Start Date End Date Taking? Authorizing Provider  amLODipine (NORVASC) 10 MG tablet TAKE 1 TABLET DAILY 07/15/20  Yes Satira Sark, MD  Cholecalciferol (VITAMIN D-3) 125 MCG (5000 UT) TABS Take 2 tablets by mouth daily.   Yes [provider]  flecainide (TAMBOCOR) 50 MG tablet TAKE 1 TABLET TWICE A DAY 07/12/20  Yes Satira Sark, MD  furosemide (LASIX) 40 MG tablet Take 40 mg by mouth daily as needed for fluid.  08/18/13  Yes [provider]  losartan (COZAAR) 100 MG tablet TAKE 1 TABLET DAILY 06/28/20  Yes Satira Sark, MD  omeprazole (PRILOSEC) 20 MG capsule TAKE 1 CAPSULE BY MOUTH  DAILY 06/28/18  Yes Satira Sark, MD  tamsulosin (FLOMAX) 0.4 MG CAPS capsule TAKE 1 CAPSULE BY MOUTH EVERY DAY Patient taking differently: 0.4 mg 3 (three) times a week. 09/18/19  Yes McKenzie, Candee Furbish, MD  chlorthalidone (HYGROTON) 25 MG tablet Take 0.5 tablets (12.5 mg total) by mouth daily. 12/24/20 03/24/21  Satira Sark, MD  doxycycline (VIBRA-TABS) 100 MG tablet Take 1 tablet (100 mg total) by mouth 2 (two) times daily. Patient not taking: Reported on 03/31/2021 11/13/20   Ailene Ards, NP  potassium chloride (KLOR-CON) 10 MEQ tablet Take 1 tablet (10 mEq total) by mouth daily. 12/24/20 03/24/21  Satira Sark, MD  Testosterone 20 % CREA Apply 100 mg topically 2 (two) times daily. Patient not taking: Reported on 03/31/2021 10/09/20   Doree Albee, MD   Social History   Socioeconomic History   Marital status: Divorced    Spouse name: Not on file   Number of children: 3   Years of education: Not on file   Highest education level: Not on file  Occupational History   Occupation: PT-custodial work  Tobacco Use   Smoking status: Never   Smokeless tobacco: Never   Vaping Use   Vaping Use: Never used  Substance and Sexual Activity   Alcohol use: No    Alcohol/week: 0.0 standard drinks   Drug use: No   Sexual activity: Not Currently  Other Topics Concern   Not on file  Social History Narrative   Divorced since 1986,married for 10 years.Lives alone.Part time work,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   Social Determinants of Health   Financial Resource Strain: Not on file  Food Insecurity: Not on file  Transportation Needs: Not on file  Physical Activity: Not on file  Stress: Not on file  Social Connections: Not on file  Intimate Partner Violence: Not on file    Review of Systems Per HPI.   Objective:   Vitals:   03/31/21 1517  BP: 128/78  Pulse: 85  Resp: 16  Temp: 98.2 F (36.8 C)  TempSrc: Temporal  SpO2: 95%  Weight: 277 lb 3.2 oz (125.7 kg)  Height: 6\' 2"  (1.88 m)     Physical Exam Vitals reviewed.  Constitutional:      Appearance: He is well-developed.  HENT:     Head: Normocephalic and atraumatic.  Neck:     Vascular: No carotid bruit or JVD.  Cardiovascular:     Rate and Rhythm: Normal rate and regular rhythm.     Heart sounds: Normal heart sounds. No murmur heard. Pulmonary:     Effort: Pulmonary effort is normal.     Breath sounds: Normal breath sounds. No rales.  Musculoskeletal:     Right lower leg: No edema.     Left lower leg: No edema.  Skin:    General: Skin is warm and dry.  Neurological:     Mental Status: He is alert and oriented to person, place, and time.  Psychiatric:        Mood and Affect: Mood normal.       Assessment & Plan:  Jonathan Dyer is a 65 y.o. male . Essential hypertension, benign - Plan: TSH  -Stable control on current regimen, continue same and follow-up with cardiology.  Check TSH with attempts at weight loss.  Need for influenza vaccination - Plan: Flu Vaccine QUAD High Dose(Fluad), Flu Vaccine QUAD High Dose(Fluad)  Paroxysmal atrial fibrillation  (HCC)  -Stable on current antiarrhythmic and med regimen.  Aspirin for anticoagulation.  With his level of activity would be cautious with other anticoagulation.  Hyperlipidemia, unspecified hyperlipidemia type - Plan: Comprehensive metabolic panel, Lipid panel  -No current statin, updated labs ordered.  With previous coronary disease would consider statin but can also discuss with cardiology once labs return.  Coronary artery disease involving native coronary artery of native heart without angina pectoris  -Asymptomatic, continue follow-up with cardiology.  Lipid panel as above.  Vitamin D deficiency - Plan: Vitamin D (25 hydroxy)  -Updated testing on MVI only.  No orders of the defined types were placed in this encounter.  Patient Instructions  Keep up the good work with weight loss.  If continued naps needed during day, should consider repeat sleep test.   Blood pressure looks good today.   I will check some fasting labs - please schedule fasting lab visit in next 2 weeks.   See foods to avoid for heartburn.   Food Choices for Gastroesophageal Reflux Disease, Adult When you have gastroesophageal reflux disease (GERD), the foods you eat and your eating habits are very important. Choosing the right foods can help ease your discomfort. Think about working with a food expert (dietitian) to help you make good choices. What are tips for following this plan? Reading food labels Look for foods that are low in saturated fat. Foods that may help with your symptoms include: Foods that have less than 5% of daily value (DV) of fat. Foods that have 0 grams of trans fat. Cooking Do not fry your food. Cook your food by baking, steaming, grilling, or broiling. These are all methods that do not need a lot of fat for cooking. To add flavor, try to use herbs that are low in spice and acidity. Meal planning  Choose healthy foods that are low in fat, such as: Fruits and vegetables. Whole  grains. Low-fat dairy products. Lean meats, fish, and poultry. Eat small meals often instead of eating 3 large meals each day. Eat your meals slowly in a place where you are relaxed. Avoid bending over or lying down until 2-3 hours after eating. Limit high-fat foods such as fatty meats or fried foods. Limit your intake of fatty foods, such as oils, butter, and shortening. Avoid the following as told by your doctor: Foods that cause symptoms. These may be different for different people. Keep a food diary to keep track of foods that cause symptoms. Alcohol. Drinking a lot of liquid with meals. Eating meals during the 2-3 hours before bed. Lifestyle Stay at a healthy weight. Ask your doctor what weight is healthy for you. If you need to lose weight, work with your doctor to do so safely. Exercise for at least 30 minutes on 5 or more days each week, or as told by your doctor. Wear loose-fitting clothes. Do not smoke or use any products that contain nicotine or tobacco. If you need help quitting, ask your doctor. Sleep with the head of your bed higher than your feet. Use a wedge under the mattress or blocks under the bed frame to raise the head of the bed. Chew sugar-free gum after meals. What foods should eat? Eat a healthy, well-balanced diet of fruits, vegetables, whole grains, low-fat dairy products, lean meats, fish, and poultry. Each person is different. Foods that may cause symptoms in one person may not cause any symptoms in another person. Work with your doctor to find foods that are safe for you. The items listed above may not be a complete  list of what you can eat and drink. Contact a food expert for more options. What foods should I avoid? Limiting some of these foods may help in managing the symptoms of GERD. Everyone is different. Talk with a food expert or your doctor to help you find the exact foods to avoid, if any. Fruits Any fruits prepared with added fat. Any fruits that cause  symptoms. For some people, this may include citrus fruits, such as oranges, grapefruit, pineapple, and lemons. Vegetables Deep-fried vegetables. [place] fries. Any vegetables prepared with added fat. Any vegetables that cause symptoms. For some people, this may include tomatoes and tomato products, chili peppers, onions and garlic, and horseradish. Grains Pastries or quick breads with added fat. Meats and other proteins High-fat meats, such as fatty beef or pork, hot dogs, ribs, ham, sausage, salami, and bacon. Fried meat or protein, including fried fish and fried chicken. Nuts and nut butters, in large amounts. Dairy Whole milk and chocolate milk. Sour cream. Cream. Ice cream. Cream cheese. Milkshakes. Fats and oils Butter. Margarine. Shortening. Ghee. Beverages Coffee and tea, with or without caffeine. Carbonated beverages. Sodas. Energy drinks. Fruit juice made with acidic fruits, such as orange or grapefruit. Tomato juice. Alcoholic drinks. Sweets and desserts Chocolate and cocoa. Donuts. Seasonings and condiments Pepper. Peppermint and spearmint. Added salt. Any condiments, herbs, or seasonings that cause symptoms. For some people, this may include curry, hot sauce, or vinegar-based salad dressings. The items listed above may not be a complete list of what you should not eat and drink. Contact a food expert for more options. Questions to ask your doctor Diet and lifestyle changes are often the first steps that are taken to manage symptoms of GERD. If diet and lifestyle changes do not help, talk with your doctor about taking medicines. Where to find more information International Foundation for Gastrointestinal Disorders: aboutgerd.org Summary When you have GERD, food and lifestyle choices are very important in easing your symptoms. Eat small meals often instead of 3 large meals a day. Eat your meals slowly and in a place where you are relaxed. Avoid bending over or lying down until 2-3  hours after eating. Limit high-fat foods such as fatty meats or fried foods. This information is not intended to replace advice given to you by your health care provider. Make sure you discuss any questions you have with your health care provider. Document Revised: 11/06/2019 Document Reviewed: 11/06/2019 Elsevier Patient Education  2022 [place],   Merri Ray, MD [place], [facility] 03/31/21 6:16 PM

## 2021-04-01 ENCOUNTER — Other Ambulatory Visit (INDEPENDENT_AMBULATORY_CARE_PROVIDER_SITE_OTHER): Payer: Medicare Other

## 2021-04-01 DIAGNOSIS — E785 Hyperlipidemia, unspecified: Secondary | ICD-10-CM

## 2021-04-01 DIAGNOSIS — I10 Essential (primary) hypertension: Secondary | ICD-10-CM

## 2021-04-01 DIAGNOSIS — E559 Vitamin D deficiency, unspecified: Secondary | ICD-10-CM | POA: Diagnosis not present

## 2021-04-01 LAB — COMPREHENSIVE METABOLIC PANEL
ALT: 11 U/L (ref 0–53)
AST: 15 U/L (ref 0–37)
Albumin: 4.4 g/dL (ref 3.5–5.2)
Alkaline Phosphatase: 79 U/L (ref 39–117)
BUN: 22 mg/dL (ref 6–23)
CO2: 27 mEq/L (ref 19–32)
Calcium: 9.2 mg/dL (ref 8.4–10.5)
Chloride: 102 mEq/L (ref 96–112)
Creatinine, Ser: 1.29 mg/dL (ref 0.40–1.50)
GFR: 57.27 mL/min — ABNORMAL LOW (ref >60.00)
Glucose, Bld: 86 mg/dL (ref 70–99)
Potassium: 4.3 mEq/L (ref 3.5–5.1)
Sodium: 139 mEq/L (ref 135–145)
Total Bilirubin: 0.6 mg/dL (ref 0.2–1.2)
Total Protein: 7.1 g/dL (ref 6.0–8.3)

## 2021-04-01 LAB — LIPID PANEL
Cholesterol: 237 mg/dL — ABNORMAL HIGH (ref 0–200)
HDL: 40.3 mg/dL (ref >39.00)
LDL Cholesterol: 172 mg/dL — ABNORMAL HIGH (ref 0–99)
NonHDL: 196.38
Total CHOL/HDL Ratio: 6
Triglycerides: 122 mg/dL (ref 0.0–149.0)
VLDL: 24.4 mg/dL (ref 0.0–40.0)

## 2021-04-01 LAB — VITAMIN D 25 HYDROXY (VIT D DEFICIENCY, FRACTURES): VITD: 30.49 ng/mL (ref 30.00–100.00)

## 2021-04-01 LAB — TSH: TSH: 2 u[IU]/mL (ref 0.35–5.50)

## 2021-04-24 ENCOUNTER — Ambulatory Visit (INDEPENDENT_AMBULATORY_CARE_PROVIDER_SITE_OTHER): Payer: Medicare Other | Admitting: Family Medicine

## 2021-04-24 ENCOUNTER — Encounter: Payer: Self-pay | Admitting: Family Medicine

## 2021-04-24 VITALS — BP 136/68 | HR 64 | Temp 98.2°F | Resp 17 | Ht 74.0 in | Wt 276.4 lb

## 2021-04-24 DIAGNOSIS — H539 Unspecified visual disturbance: Secondary | ICD-10-CM | POA: Diagnosis not present

## 2021-04-24 DIAGNOSIS — I2510 Atherosclerotic heart disease of native coronary artery without angina pectoris: Secondary | ICD-10-CM | POA: Diagnosis not present

## 2021-04-24 DIAGNOSIS — R519 Headache, unspecified: Secondary | ICD-10-CM

## 2021-04-24 DIAGNOSIS — I480 Paroxysmal atrial fibrillation: Secondary | ICD-10-CM

## 2021-04-24 DIAGNOSIS — I10 Essential (primary) hypertension: Secondary | ICD-10-CM | POA: Diagnosis not present

## 2021-04-24 NOTE — Patient Instructions (Signed)
Exam is reassuring today.  See information below on various causes of headache.  For now I do recommend cutting back somewhat on the iced tea and increase water intake.  Do not stop caffeine abruptly as that can worsen headache.  If any return of vision symptoms call ophthalmology to be seen right away.  Give me an update on your symptoms in the next week.  If not improving, or any worsening symptoms I would want to see you sooner.  If headaches not improving we may refer you to a specialist or start with some neuroimaging.  Keep me posted.  General Headache Without Cause A headache is pain or discomfort felt around the head or neck area. There are many causes and types of headaches. A few common types include: Tension headaches. Migraine headaches. Cluster headaches. Chronic daily headaches. Sometimes, the specific cause of a headache may not be found. Follow these instructions at home: Watch your condition for any changes. Let your health care provider know about them. Take these steps to help with your condition: Managing pain   Take over-the-counter and prescription medicines only as told by your health care provider. Treatment may include medicines for pain that are taken by mouth or applied to the skin. Lie down in a dark, quiet room when you have a headache. Keep lights dim if bright lights bother you or make your headaches worse. If directed, put ice on your head and neck area: Put ice in a plastic bag. Place a towel between your skin and the bag. Leave the ice on for 20 minutes, 2-3 times per day. Remove the ice if your skin turns bright red. This is very important. If you cannot feel pain, heat, or cold, you have a greater risk of damage to the area. If directed, apply heat to the affected area. Use the heat source that your health care provider recommends, such as a moist heat pack or a heating pad. Place a towel between your skin and the heat source. Leave the heat on for 20-30  minutes. Remove the heat if your skin turns bright red. This is especially important if you are unable to feel pain, heat, or cold. You have a greater risk of getting burned. Eating and drinking Eat meals on a regular schedule. If you drink alcohol: Limit how much you have to: 0-1 drink a day for women who are not pregnant. 0-2 drinks a day for men. Know how much alcohol is in a drink. In the [place] one drink equals one 12 oz bottle of beer (355 mL), one 5 oz glass of wine (148 mL), or one 1 oz glass of hard liquor (44 mL). Stop drinking caffeine, or decrease the amount of caffeine you drink. Drink enough fluid to keep your urine pale yellow. General instructions  Keep a headache journal to help find out what may trigger your headaches. For example, write down: What you eat and drink. How much sleep you get. Any change to your diet or medicines. Try massage or other relaxation techniques. Limit stress. Sit up straight, and do not tense your muscles. Do not use any products that contain nicotine or tobacco. These products include cigarettes, chewing tobacco, and vaping devices, such as e-cigarettes. If you need help quitting, ask your health care provider. Exercise regularly as told by your health care provider. Sleep on a regular schedule. Get 7-9 hours of sleep each night, or the amount recommended by your health care provider. Keep all follow-up visits. This is  important. Contact a health care provider if: Medicine does not help your symptoms. You have a headache that is different from your usual headache. You have nausea or you vomit. You have a fever. Get help right away if: Your headache: Becomes severe quickly. Gets worse after moderate to intense physical activity. You have any of these symptoms: Repeated vomiting. Pain or stiffness in your neck. Changes to your vision. Pain in an eye or ear. Problems with speech. Muscular weakness or loss of muscle control. Loss of  balance or coordination. You feel faint or pass out. You have confusion. You have a seizure. These symptoms may represent a serious problem that is an emergency. Do not wait to see if the symptoms will go away. Get medical help right away. Call your local emergency services (911 in the [place] Do not drive yourself to the hospital. Summary A headache is pain or discomfort felt around the head or neck area. There are many causes and types of headaches. In some cases, the cause may not be found. Keep a headache journal to help find out what may trigger your headaches. Watch your condition for any changes. Let your health care provider know about them. Contact a health care provider if you have a headache that is different from the usual headache, or if your symptoms are not helped by medicine. Get help right away if your headache becomes severe, you vomit, you have a loss of vision, you lose your balance, or you have a seizure. This information is not intended to replace advice given to you by your health care provider. Make sure you discuss any questions you have with your health care provider. Document Revised: 09/25/2020 Document Reviewed: 09/25/2020 Elsevier Patient Education  [place].

## 2021-04-24 NOTE — Progress Notes (Signed)
Subjective:  [name], male    DOB: 20-Aug-1955  Age: 65 y.o. MRN: [id number]  CC:  Chief Complaint  Patient presents with   Headache    Pt reports headache for past week, has fluctuated in severity     HPI Jonathan Dyer presents for   Headache: Noted over past 1 week. Mild nagging headache. Occipital bilateral..  Notices more at some times but no change in severity.  Did have some vision changes 5 days ago - loss of peripheral vision both sides, resolved in 20 min. No amaurosis fugax, no current visual sx's. Thought to have ophthalmic migraine by optho in past - similar sx's in past but worse then.  No jaw pain/claudication. No temporal HA.  No new palpitations.  No focal weakness, slurred speech. No nausea or vomiting. No cough/congestion.  Denies fever, new myalgia/arthralgia.  No known sick contacts. History of hypertension, home readings: 120 up to 140-150/60-70. Similar readings since last year. No recent changes.  No missed doses of losartan, flecainide chlorthalidone or furosemide. Trying to lose weight - cutting back on portions.  No neck pain Unsweet ice tea is main beverage. About a gallon and a half per day. No recent changes. No regular water. Eating/drinking normally. Urinating ok - taking flomax more often - few days per week for weak stream past few weeks.  Prior once per day.  Tx: few tylenol - min change.   History Patient Active Problem List   Diagnosis Date Noted   Arthritis of carpometacarpal [facility]) joint of left thumb 02/05/2020   Gastroesophageal reflux disease without esophagitis 04/17/2019   Localized, primary osteoarthritis of hand 09/06/2018   Trigger finger of right hand 09/06/2018   Osteoarthritis of carpometacarpal [facility]) joint of thumb 09/06/2018   Renal cyst 07/04/2018   Colonic mass 01/03/2018   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   Screen for colon cancer 12/29/2017   Arthralgia of right foot 08/17/2017   Degeneration of lumbar  intervertebral disc 08/04/2017   Osteoarthritis of right knee 07/20/2017   Spinal stenosis 07/20/2017   Hypertensive disorder 01/01/2016   Hx of adenomatous colonic polyps 03/01/2015   Pain in joint, shoulder region 01/26/2012   Impingement syndrome of left shoulder 01/26/2012   Muscle weakness (generalized) 01/26/2012   Hyperlipidemia, mixed 10/11/2009   Obstructive sleep apnea 10/11/2009   PAROXYSMAL ATRIAL FIBRILLATION 10/11/2009   CAD (coronary artery disease), native coronary artery 08/21/2009   Benign essential hypertension 11/09/2008   Past Medical History:  Diagnosis Date   Allergy    Arthritis    Atrial fibrillation ([place])    Diagnosed 09/2009, on flecainide   Bradycardia    Event monitor 2010: HR down to 45, asymptomatic.   Cataract    Coronary atherosclerosis of native coronary artery    Nonobstructive and distal disease by cath 2005.    Essential hypertension    Hyperlipidemia    OSA (obstructive sleep apnea)    Renal cyst    Ringing in ears    Sleep apnea    Past Surgical History:  Procedure Laterality Date   BIOPSY  01/04/2018   Procedure: BIOPSY;  Surgeon: [name], MD;  Location: [facility];  Service: Endoscopy;;  ascending colon    CARDIAC CATHETERIZATION  2005   COLONOSCOPY  05/2006   SLF: 3 mm sigmoid: Tubular adenoma removed.   COLONOSCOPY N/A 05/06/2016   Dr. [name]: two sessile polyps in mid transverse colon and hepatic flexure (tubular adenomas). ext/int hemorrhoids. tortuous  left colon.    COLONOSCOPY WITH PROPOFOL N/A 01/04/2018   Procedure: COLONOSCOPY WITH PROPOFOL;  Surgeon: [name], MD;  Location: [facility];  Service: Endoscopy;  Laterality: N/A;  2:30pm   KNEE ARTHROSCOPY     Left anterior cruciate ligament reconstruction     left shoulder surgery     dr . Herbie [place] collins    POLYPECTOMY  05/06/2016   Procedure: POLYPECTOMY;  Surgeon: [name], MD;  Location: [facility];  Service: Endoscopy;;  transverse colon  polyp, hepatic flexure polyp,    Right shoulder surgery     ROBOT ASSISTED LAPAROSCOPIC NEPHRECTOMY Left 07/04/2018   Procedure: XI ROBOTIC ASSISTED LAPAROSCOPIC RENAL CYST DECORTICATION;  Surgeon: [name], MD;  Location: [facility];  Service: Urology;  Laterality: Left;  3 HRSPROCEDURE: ROBOTIC RENAL CYST DECORTICATION   TONSILLECTOMY     TOTAL KNEE ARTHROPLASTY     Allergies  Allergen Reactions   Clindamycin/Lincomycin Swelling and Other (See Comments)    Mycin drug family    Penicillins Other (See Comments)    Has patient had a PCN reaction causing immediate rash, facial/tongue/throat swelling, SOB or lightheadedness with hypotension: unknown Has patient had a PCN reaction causing severe rash involving mucus membranes or skin necrosis: unknown Has patient had a PCN reaction that required hospitalization unknown Has patient had a PCN reaction occurring within the last 10 years: unknown If all of the above answers are "NO", then may proceed    Pneumococcal 13-Val Conj Vacc Swelling    Patient stated that he has severe swelling in his arm from vaccine and swollen up to his collar bone. Lasted about 2 days and did not feel well.    Prior to Admission medications   Medication Sig Start Date End Date Taking? Authorizing Provider  amLODipine (NORVASC) 10 MG tablet TAKE 1 TABLET DAILY 07/15/20  Yes Satira Sark, MD  Cholecalciferol (VITAMIN D-3) 125 MCG (5000 UT) TABS Take 2 tablets by mouth daily.   Yes [provider]  flecainide (TAMBOCOR) 50 MG tablet TAKE 1 TABLET TWICE A DAY 07/12/20  Yes Satira Sark, MD  furosemide (LASIX) 40 MG tablet Take 40 mg by mouth daily as needed for fluid.  08/18/13  Yes [provider]  losartan (COZAAR) 100 MG tablet TAKE 1 TABLET DAILY 06/28/20  Yes Satira Sark, MD  omeprazole (PRILOSEC) 20 MG capsule TAKE 1 CAPSULE BY MOUTH  DAILY 06/28/18  Yes Satira Sark, MD  tamsulosin (FLOMAX) 0.4 MG CAPS capsule TAKE 1  CAPSULE BY MOUTH EVERY DAY Patient taking differently: 0.4 mg 3 (three) times a week. 09/18/19  Yes McKenzie, Candee Furbish, MD  chlorthalidone (HYGROTON) 25 MG tablet Take 0.5 tablets (12.5 mg total) by mouth daily. 12/24/20 03/24/21  Satira Sark, MD  doxycycline (VIBRA-TABS) 100 MG tablet Take 1 tablet (100 mg total) by mouth 2 (two) times daily. Patient not taking: Reported on 03/31/2021 11/13/20   Ailene Ards, NP  potassium chloride (KLOR-CON) 10 MEQ tablet Take 1 tablet (10 mEq total) by mouth daily. 12/24/20 03/24/21  Satira Sark, MD  Testosterone 20 % CREA Apply 100 mg topically 2 (two) times daily. Patient not taking: Reported on 03/31/2021 10/09/20   Doree Albee, MD   Social History   Socioeconomic History   Marital status: Divorced    Spouse name: Not on file   Number of children: 3   Years of education: Not on file   Highest education  level: Not on file  Occupational History   Occupation: PT-custodial work  Tobacco Use   Smoking status: Never   Smokeless tobacco: Never  Vaping Use   Vaping Use: Never used  Substance and Sexual Activity   Alcohol use: No    Alcohol/week: 0.0 standard drinks   Drug use: No   Sexual activity: Not Currently  Other Topics Concern   Not on file  Social History Narrative   Divorced since 1986,married for 10 years.Lives alone.Part time work,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   Social Determinants of Health   Financial Resource Strain: Not on file  Food Insecurity: Not on file  Transportation Needs: Not on file  Physical Activity: Not on file  Stress: Not on file  Social Connections: Not on file  Intimate Partner Violence: Not on file    Review of Systems Per HPI.   Objective:   Vitals:   04/24/21 1122  BP: 136/68  Pulse: 64  Resp: 17  Temp: 98.2 F (36.8 C)  TempSrc: Temporal  SpO2: 97%  Weight: 276 lb 6.4 oz (125.4 kg)  Height: 6\' 2"  (1.88 m)     Physical Exam Vitals reviewed.  Constitutional:       Appearance: He is well-developed.  HENT:     Head: Normocephalic and atraumatic.  Neck:     Vascular: No carotid bruit or JVD.  Cardiovascular:     Rate and Rhythm: Normal rate and regular rhythm.     Heart sounds: Normal heart sounds. No murmur heard. Pulmonary:     Effort: Pulmonary effort is normal.     Breath sounds: Normal breath sounds. No rales.  Musculoskeletal:     Right lower leg: No edema.     Left lower leg: No edema.  Skin:    General: Skin is warm and dry.  Neurological:     Mental Status: He is alert and oriented to person, place, and time.  Psychiatric:        Mood and Affect: Mood normal.  Pain free cervical range of motion.  No focal bony tenderness.  Does not reproduce headache or change headache symptoms.     32 minutes spent during visit, including chart review, counseling and assimilation of information, neurologic exam, discussion of plan, and chart completion.   Assessment & Plan:  Jonathan Dyer is a 65 y.o. male . Essential hypertension, benign  Paroxysmal atrial fibrillation (HCC)  Occipital headache  Transient vision disturbance  Mild but persistent headache, now over 1 week.  No other associated symptoms except for the transient visual changes that quickly resolved.  Differential includes ophthalmic migraine, less likely increased IOP.  Nonfocal neurologic exam.  No other systemic symptoms.  Could be related to caffeine intake and relative volume depletion.  Initially we will try decreasing tea intake, increase water, update on symptoms within the next 1 week.  Consider neuroimaging if persistent.  RTC/ER precautions if acute changes or worsening.  Optho eval ASAP if any return of visual changes.    No orders of the defined types were placed in this encounter.  Patient Instructions  Exam is reassuring today.  See information below on various causes of headache.  For now I do recommend cutting back somewhat on the iced tea and increase water  intake.  Do not stop caffeine abruptly as that can worsen headache.  If any return of vision symptoms call ophthalmology to be seen right away.  Give me an update on your symptoms in the next week.  If not improving, or any worsening symptoms I would want to see you sooner.  If headaches not improving we may refer you to a specialist or start with some neuroimaging.  Keep me posted.  General Headache Without Cause A headache is pain or discomfort felt around the head or neck area. There are many causes and types of headaches. A few common types include: Tension headaches. Migraine headaches. Cluster headaches. Chronic daily headaches. Sometimes, the specific cause of a headache may not be found. Follow these instructions at home: Watch your condition for any changes. Let your health care provider know about them. Take these steps to help with your condition: Managing pain   Take over-the-counter and prescription medicines only as told by your health care provider. Treatment may include medicines for pain that are taken by mouth or applied to the skin. Lie down in a dark, quiet room when you have a headache. Keep lights dim if bright lights bother you or make your headaches worse. If directed, put ice on your head and neck area: Put ice in a plastic bag. Place a towel between your skin and the bag. Leave the ice on for 20 minutes, 2-3 times per day. Remove the ice if your skin turns bright red. This is very important. If you cannot feel pain, heat, or cold, you have a greater risk of damage to the area. If directed, apply heat to the affected area. Use the heat source that your health care provider recommends, such as a moist heat pack or a heating pad. Place a towel between your skin and the heat source. Leave the heat on for 20-30 minutes. Remove the heat if your skin turns bright red. This is especially important if you are unable to feel pain, heat, or cold. You have a greater risk of  getting burned. Eating and drinking Eat meals on a regular schedule. If you drink alcohol: Limit how much you have to: 0-1 drink a day for women who are not pregnant. 0-2 drinks a day for men. Know how much alcohol is in a drink. In the [place] one drink equals one 12 oz bottle of beer (355 mL), one 5 oz glass of wine (148 mL), or one 1 oz glass of hard liquor (44 mL). Stop drinking caffeine, or decrease the amount of caffeine you drink. Drink enough fluid to keep your urine pale yellow. General instructions  Keep a headache journal to help find out what may trigger your headaches. For example, write down: What you eat and drink. How much sleep you get. Any change to your diet or medicines. Try massage or other relaxation techniques. Limit stress. Sit up straight, and do not tense your muscles. Do not use any products that contain nicotine or tobacco. These products include cigarettes, chewing tobacco, and vaping devices, such as e-cigarettes. If you need help quitting, ask your health care provider. Exercise regularly as told by your health care provider. Sleep on a regular schedule. Get 7-9 hours of sleep each night, or the amount recommended by your health care provider. Keep all follow-up visits. This is important. Contact a health care provider if: Medicine does not help your symptoms. You have a headache that is different from your usual headache. You have nausea or you vomit. You have a fever. Get help right away if: Your headache: Becomes severe quickly. Gets worse after moderate to intense physical activity. You have any of these symptoms: Repeated vomiting. Pain or stiffness in your neck. Changes to  your vision. Pain in an eye or ear. Problems with speech. Muscular weakness or loss of muscle control. Loss of balance or coordination. You feel faint or pass out. You have confusion. You have a seizure. These symptoms may represent a serious problem that is an  emergency. Do not wait to see if the symptoms will go away. Get medical help right away. Call your local emergency services (911 in the [place] Do not drive yourself to the hospital. Summary A headache is pain or discomfort felt around the head or neck area. There are many causes and types of headaches. In some cases, the cause may not be found. Keep a headache journal to help find out what may trigger your headaches. Watch your condition for any changes. Let your health care provider know about them. Contact a health care provider if you have a headache that is different from the usual headache, or if your symptoms are not helped by medicine. Get help right away if your headache becomes severe, you vomit, you have a loss of vision, you lose your balance, or you have a seizure. This information is not intended to replace advice given to you by your health care provider. Make sure you discuss any questions you have with your health care provider. Document Revised: 09/25/2020 Document Reviewed: 09/25/2020 Elsevier Patient Education  2022 [place],   Merri Ray, MD [place] 04/24/21 12:06 PM

## 2021-04-28 ENCOUNTER — Encounter: Payer: Self-pay | Admitting: *Deleted

## 2021-05-01 ENCOUNTER — Telehealth: Payer: Self-pay

## 2021-05-01 NOTE — Telephone Encounter (Signed)
Spoke to patient and he states that he is feeling better all together but would call if anything changed and would give [place] an update if he took an at home covid test

## 2021-05-01 NOTE — Telephone Encounter (Signed)
Caller name:Samir Carreiro   On DPR? :Yes  Call back number:[phone]  Provider they see: Carlota Raspberry   Reason for call: Pt is calling back about headache you had asked him to call back and let us know how he is doing, Headache easing up. Monday night he got sore throat and ear pain running fever broke Tuesday still with sore throat and ear pain but seems to be improving. Pt did not test for Covid

## 2021-05-01 NOTE — Telephone Encounter (Signed)
Appreciate the update.  Glad to hear the headache is improving, but with recent sore throat, ear pain, fever, I do recommend COVID testing.  Let us know those results.  Home test is fine.

## 2021-05-04 ENCOUNTER — Other Ambulatory Visit: Payer: Self-pay | Admitting: Cardiology

## 2021-05-22 ENCOUNTER — Ambulatory Visit (INDEPENDENT_AMBULATORY_CARE_PROVIDER_SITE_OTHER): Payer: Medicare Other | Admitting: Family Medicine

## 2021-05-22 ENCOUNTER — Encounter: Payer: Self-pay | Admitting: Family Medicine

## 2021-05-22 VITALS — BP 128/70 | HR 62 | Temp 98.1°F | Resp 17 | Ht 74.0 in | Wt 277.4 lb

## 2021-05-22 DIAGNOSIS — H9202 Otalgia, left ear: Secondary | ICD-10-CM

## 2021-05-22 DIAGNOSIS — H9192 Unspecified hearing loss, left ear: Secondary | ICD-10-CM

## 2021-05-22 MED ORDER — PREDNISONE 20 MG PO TABS: ORAL_TABLET | ORAL | 0 refills | Status: DC

## 2021-05-22 NOTE — Patient Instructions (Signed)
Ear canal clear.  Minimal fluid at the base of your eardrum but with the testing in the office I am going to start you on prednisone for a possible rare cause of hearing loss called sudden sensorineural hearing loss.  I will have you evaluated hopefully either tomorrow or Monday by ear nose and throat specialist and they can decide if continued high-dose prednisone needed or okay to taper it as it is currently written.  If you have any discharge from the ear or worsening pain, or new/worsening symptoms be seen sooner. Tylenol for ear pain if needed.   Return to the clinic or go to the nearest emergency room if any of your symptoms worsen or new symptoms occur.  Hearing Loss Hearing loss is a partial or total loss of the ability to hear. This can be temporary or permanent, and it can happen in one or both ears. Medical care is necessary to treat hearing loss properly and to prevent the condition from getting worse. Your hearing may partially or completely come back, depending on what caused your hearing loss and how severe it is. In some cases, hearing loss is permanent. What are the causes? Common causes of hearing loss include: Too much wax in the ear canal. Infection of the ear canal or middle ear. Fluid in the middle ear. Injury to the ear or surrounding area. An object stuck in the ear. A history of prolonged exposure to loud sounds, such as music. Less common causes of hearing loss include: Tumors in the ear. Viral or bacterial infections, such as meningitis. A hole in the eardrum (perforated eardrum). Problems with the hearing nerve that sends signals between the brain and the ear. Certain medicines. What are the signs or symptoms? Symptoms of this condition may include: Difficulty telling the difference between sounds. Difficulty following a conversation when there is background noise. Lack of response to sounds in your environment. This may be most noticeable when you do not respond  to startling sounds. Needing to turn up the volume on the television, radio, or other devices. Ringing in the ears. Dizziness. How is this diagnosed? This condition is diagnosed based on: A physical exam. A hearing test (audiometry). The audiometry test will be performed by a hearing specialist (audiologist). You may also be referred to an ear, nose, and throat (ENT) specialist (otolaryngologist). How is this treated? Treatment for hearing loss may include: Ear wax removal. Medicines to treat or prevent infection (antibiotics). Medicines to reduce inflammation (corticosteroids). Hearing aids for hearing loss related to nerve damage. Follow these instructions at home: If you were prescribed an antibiotic medicine, take it as told by your health care provider. Do not stop taking the antibiotic even if you start to feel better. Take over-the-counter and prescription medicines only as told by your health care provider. Avoid loud noises. Return to your normal activities as told by your health care provider. Ask your health care provider what activities are safe for you. Keep all follow-up visits as told by your health care provider. This is important. Contact a health care provider if: You feel dizzy. You develop new symptoms. You vomit or feel nauseous. You have a fever. Get help right away if: You develop sudden changes in your vision. You have severe ear pain. You have new or increased weakness. You have a severe headache. Summary Hearing loss is a decreased ability to hear sounds around you. It can be temporary or permanent. Treatment will depend on the cause of your hearing  loss. It may include ear wax removal, medicines, or a hearing aid. Your hearing may partially or completely come back, depending on what caused your hearing loss and how severe it is. Keep all follow-up visits as told by your health care provider. This is important. This information is not intended to replace  advice given to you by your health care provider. Make sure you discuss any questions you have with your health care provider. Document Revised: 01/25/2018 Document Reviewed: 01/25/2018 Elsevier Patient Education  [place], Adult An earache, or ear pain, can be caused by many things, including: An infection. Ear wax buildup. Ear pressure. Something in the ear that should not be there (foreign body). A sore throat. Tooth problems. Jaw problems. Treatment of the earache will depend on the cause. If the cause is not clear or cannot be determined, you may need to watch your symptoms until your earache goes away or until a cause is found. Follow these instructions at home: Medicines Take or apply over-the-counter and prescription medicines only as told by your health care provider. If you were prescribed an antibiotic medicine, use it as told by your health care provider. Do not stop using the antibiotic even if you start to feel better. Do not put anything in your ear other than medicine that is prescribed by your health care provider. Managing pain If directed, apply heat to the affected area as often as told by your health care provider. Use the heat source that your health care provider recommends, such as a moist heat pack or a heating pad. Place a towel between your skin and the heat source. Leave the heat on for 20-30 minutes. Remove the heat if your skin turns bright red. This is especially important if you are unable to feel pain, heat, or cold. You may have a greater risk of getting burned. If directed, put ice on the affected area as often as told by your health care provider. To do this:   Put ice in a plastic bag. Place a towel between your skin and the bag. Leave the ice on for 20 minutes, 2-3 times a day. General instructions Pay attention to any changes in your symptoms. Try resting in an upright position instead of lying down. This may help to reduce  pressure in your ear and relieve pain. Chew gum if it helps to relieve your ear pain. Treat any allergies as told by your health care provider. Drink enough fluid to keep your urine pale yellow. It is up to you to get the results of any tests that were done. Ask your health care provider, or the department that is doing the tests, when your results will be ready. Keep all follow-up visits as told by your health care provider. This is important. Contact a health care provider if: Your pain does not improve within 2 days. Your earache gets worse. You have new symptoms. You have a fever. Get help right away if you: Have a severe headache. Have a stiff neck. Have trouble swallowing. Have redness or swelling behind your ear. Have fluid or blood coming from your ear. Have hearing loss. Feel dizzy. Summary An earache, or ear pain, can be caused by many things. Treatment of the earache will depend on the cause. Follow recommendations from your health care provider to treat your ear pain. If the cause is not clear or cannot be determined, you may need to watch your symptoms until your earache goes away or until a  cause is found. Keep all follow-up visits as told by your health care provider. This is important. This information is not intended to replace advice given to you by your health care provider. Make sure you discuss any questions you have with your health care provider. Document Revised: 12/03/2018 Document Reviewed: 12/03/2018 Elsevier Patient Education  [place].

## 2021-05-22 NOTE — Progress Notes (Signed)
Subjective:  [name], male    DOB: 02/10/1956  Age: 66 y.o. MRN: [id number]  CC:  Chief Complaint  Patient presents with   Otalgia    Pt reports some lessened or muffled hearing, noticed Saturday, pain does not occur at the same time as decreased hearing    HPI Jonathan Dyer presents for   R sided ear pain: Since Christmas.  In past has had ear pain with cold temps or wind. Improves with warming.  Cold weather this Christmas, same pain, but has not improved as usual.  Soreness persisted. Feels puffy in ear.  No fever, no ear d/c.  Feels blocked slightly since 5 days ago. No nasal congestion, cough, sneezing.  Dental appt next week - gum recession lower left, some lower R tooth pain.   Tx: Debrox, flushing of ear few days ago - some wax.     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   Screen for colon cancer 12/29/2017   Arthralgia of right foot 08/17/2017   Degeneration of lumbar intervertebral disc 08/04/2017   Osteoarthritis of right knee 07/20/2017   Spinal stenosis 07/20/2017   Hypertensive disorder 01/01/2016   Hx of adenomatous colonic polyps 03/01/2015   Pain in joint, shoulder region 01/26/2012   Impingement syndrome of left shoulder 01/26/2012   Muscle weakness (generalized) 01/26/2012   Hyperlipidemia, mixed 10/11/2009   Obstructive sleep apnea 10/11/2009   PAROXYSMAL ATRIAL FIBRILLATION 10/11/2009   CAD (coronary artery disease), native coronary artery 08/21/2009   Benign essential hypertension 11/09/2008   Past Medical History:   Diagnosis Date   Allergy    Arthritis    Atrial fibrillation ([place])    Diagnosed 09/2009, on flecainide   Bradycardia    Event monitor 2010: HR down to 45, asymptomatic.   Cataract    Coronary atherosclerosis of native coronary artery    Nonobstructive and distal disease by cath 2005.    Essential hypertension    Hyperlipidemia    OSA (obstructive sleep apnea)    Renal cyst    Ringing in ears    Sleep apnea    Past Surgical History:  Procedure Laterality Date   BIOPSY  01/04/2018   Procedure: BIOPSY;  Surgeon: [name], MD;  Location: [facility];  Service: Endoscopy;;  ascending colon    CARDIAC CATHETERIZATION  2005   COLONOSCOPY  05/2006   SLF: 3 mm sigmoid: Tubular adenoma removed.   COLONOSCOPY N/A 05/06/2016   Dr. [name]: two sessile polyps in mid transverse colon and hepatic flexure (tubular adenomas). ext/int hemorrhoids. tortuous left colon.    COLONOSCOPY WITH PROPOFOL N/A 01/04/2018   Procedure: COLONOSCOPY WITH PROPOFOL;  Surgeon: [name], MD;  Location: [facility];  Service: Endoscopy;  Laterality: N/A;  2:30pm   KNEE ARTHROSCOPY     Left anterior cruciate ligament reconstruction     left shoulder surgery     dr . Herbie [place] collins    POLYPECTOMY  05/06/2016   Procedure: POLYPECTOMY;  Surgeon: [name], MD;  Location: [facility];  Service: Endoscopy;;  transverse colon polyp, hepatic  flexure polyp,    Right shoulder surgery     ROBOT ASSISTED LAPAROSCOPIC NEPHRECTOMY Left 07/04/2018   Procedure: XI ROBOTIC ASSISTED LAPAROSCOPIC RENAL CYST DECORTICATION;  Surgeon: [name], MD;  Location: [facility];  Service: Urology;  Laterality: Left;  3 HRSPROCEDURE: ROBOTIC RENAL CYST DECORTICATION   TONSILLECTOMY     TOTAL KNEE ARTHROPLASTY     Allergies  Allergen Reactions   Clindamycin/Lincomycin Swelling and Other (See Comments)    Mycin drug family    Penicillins Other (See Comments)    Has patient had a PCN reaction causing immediate  rash, facial/tongue/throat swelling, SOB or lightheadedness with hypotension: unknown Has patient had a PCN reaction causing severe rash involving mucus membranes or skin necrosis: unknown Has patient had a PCN reaction that required hospitalization unknown Has patient had a PCN reaction occurring within the last 10 years: unknown If all of the above answers are "NO", then may proceed    Pneumococcal 13-Val Conj Vacc Swelling    Patient stated that he has severe swelling in his arm from vaccine and swollen up to his collar bone. Lasted about 2 days and did not feel well.    Prior to Admission medications   Medication Sig Start Date End Date Taking? Authorizing Provider  amLODipine (NORVASC) 10 MG tablet TAKE 1 TABLET DAILY 07/15/20   Satira Sark, MD  chlorthalidone (HYGROTON) 25 MG tablet Take 0.5 tablets (12.5 mg total) by mouth daily. 12/24/20 03/24/21  Satira Sark, MD  Cholecalciferol (VITAMIN D-3) 125 MCG (5000 UT) TABS Take 2 tablets by mouth daily.    [provider]  doxycycline (VIBRA-TABS) 100 MG tablet Take 1 tablet (100 mg total) by mouth 2 (two) times daily. Patient not taking: Reported on 03/31/2021 11/13/20   Ailene Ards, NP  flecainide [facility]) 50 MG tablet TAKE 1 TABLET TWICE A DAY 07/12/20   Satira Sark, MD  furosemide (LASIX) 40 MG tablet Take 40 mg by mouth daily as needed for fluid.  08/18/13   [provider]  losartan (COZAAR) 100 MG tablet TAKE 1 TABLET DAILY 05/06/21   Satira Sark, MD  omeprazole (PRILOSEC) 20 MG capsule TAKE 1 CAPSULE BY MOUTH  DAILY 06/28/18   Satira Sark, MD  potassium chloride (KLOR-CON) 10 MEQ tablet Take 1 tablet (10 mEq total) by mouth daily. 12/24/20 03/24/21  Satira Sark, MD  tamsulosin (FLOMAX) 0.4 MG CAPS capsule TAKE 1 CAPSULE BY MOUTH EVERY DAY Patient taking differently: 0.4 mg 3 (three) times a week. 09/18/19   McKenzie, Candee Furbish, MD  Testosterone 20 % CREA Apply 100 mg topically 2  (two) times daily. Patient not taking: Reported on 03/31/2021 10/09/20   Doree Albee, MD   Social History   Socioeconomic History   Marital status: Divorced    Spouse name: Not on file   Number of children: 3   Years of education: Not on file   Highest education level: Not on file  Occupational History   Occupation: PT-custodial work  Tobacco Use   Smoking status: Never   Smokeless tobacco: Never  Vaping Use   Vaping Use: Never used  Substance and Sexual Activity   Alcohol use: No    Alcohol/week: 0.0 standard drinks   Drug use: No   Sexual activity: Not Currently  Other Topics Concern   Not on file  Social History Narrative   Divorced since 1986,married for 10 years.Lives alone.Part time work,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   Social  Determinants of Health   Financial Resource Strain: Not on file  Food Insecurity: Not on file  Transportation Needs: Not on file  Physical Activity: Not on file  Stress: Not on file  Social Connections: Not on file  Intimate Partner Violence: Not on file    Review of Systems Per HPI.   Objective:   Vitals:   05/22/21 1548  BP: 128/70  Pulse: 62  Resp: 17  Temp: 98.1 F (36.7 C)  TempSrc: Temporal  SpO2: 95%  Weight: 277 lb 6.4 oz (125.8 kg)  Height: 6\' 2"  (1.88 m)     Physical Exam Vitals reviewed.  Constitutional:      General: He is not in acute distress.    Appearance: Normal appearance. He is well-developed.  HENT:     Head: Normocephalic and atraumatic.     Right Ear: Ear canal and external ear normal.     Left Ear: There is impacted cerumen (moderate cerumen, visualized tm pearly gray.).     Ears:     Comments: R TM without injection, erythema. Min clear fluid at base. No perf, canal clear, no cerumen in canal.  Weber - localizes left. Hears more on left.  Rinne- bone conduction greater than air conduction (13s vs 10s). Repeat testing opposite - AC>BC by few seconds.   Finger rub - decr hearing on  right.      Mouth/Throat:     Comments: Few areas of decay, no gum edema or sign of abscess. Tooth nontender.  Neck:     Vascular: No carotid bruit.     Comments: No neck swelling, LAD or ttp.  Cardiovascular:     Rate and Rhythm: Normal rate.  Pulmonary:     Effort: Pulmonary effort is normal.  Lymphadenopathy:     Cervical: No cervical adenopathy.  Skin:    General: Skin is warm and dry.     Findings: No rash.  Neurological:     Mental Status: He is alert and oriented to person, place, and time.  Psychiatric:        Mood and Affect: Mood normal.       Assessment & Plan:  Jonathan Dyer is a 66 y.o. male . Left ear pain - Plan: Ambulatory referral to ENT  Hearing loss of left ear, unspecified hearing loss type - Plan: Ambulatory referral to ENT, predniSONE (DELTASONE) 20 MG tablet  No significant impaction, canal appears clear.  No perforation seen.  Minimal clear fluid at the base of the TM but does not appear injected or bulging.  Testing with Weber and Rinne testing concerning for sensorineural hearing loss, differential includes sudden sensorineural hearing loss versus small effusion.   Start high-dose prednisone, urgent ENT referral. Tylenol as needed.  Potential effects of meds discussed.  Urgent care/ER precautions given.    Meds ordered this encounter  Medications   predniSONE (DELTASONE) 20 MG tablet    Sig: 3 by mouth for 3 days, then 2 by mouth for 2 days, then 1 by mouth for 2 days, then 1/2 by mouth for 2 days.    Dispense:  16 tablet    Refill:  0   Patient Instructions  Ear canal clear.  Minimal fluid at the base of your eardrum but with the testing in the office I am going to start you on prednisone for a possible rare cause of hearing loss called sudden sensorineural hearing loss.  I will have you evaluated hopefully either tomorrow or Monday by ear  nose and throat specialist and they can decide if continued high-dose prednisone needed or okay to  taper it as it is currently written.  If you have any discharge from the ear or worsening pain, or new/worsening symptoms be seen sooner. Tylenol for ear pain if needed.   Return to the clinic or go to the nearest emergency room if any of your symptoms worsen or new symptoms occur.  Hearing Loss Hearing loss is a partial or total loss of the ability to hear. This can be temporary or permanent, and it can happen in one or both ears. Medical care is necessary to treat hearing loss properly and to prevent the condition from getting worse. Your hearing may partially or completely come back, depending on what caused your hearing loss and how severe it is. In some cases, hearing loss is permanent. What are the causes? Common causes of hearing loss include: Too much wax in the ear canal. Infection of the ear canal or middle ear. Fluid in the middle ear. Injury to the ear or surrounding area. An object stuck in the ear. A history of prolonged exposure to loud sounds, such as music. Less common causes of hearing loss include: Tumors in the ear. Viral or bacterial infections, such as meningitis. A hole in the eardrum (perforated eardrum). Problems with the hearing nerve that sends signals between the brain and the ear. Certain medicines. What are the signs or symptoms? Symptoms of this condition may include: Difficulty telling the difference between sounds. Difficulty following a conversation when there is background noise. Lack of response to sounds in your environment. This may be most noticeable when you do not respond to startling sounds. Needing to turn up the volume on the television, radio, or other devices. Ringing in the ears. Dizziness. How is this diagnosed? This condition is diagnosed based on: A physical exam. A hearing test (audiometry). The audiometry test will be performed by a hearing specialist (audiologist). You may also be referred to an ear, nose, and throat (ENT)  specialist (otolaryngologist). How is this treated? Treatment for hearing loss may include: Ear wax removal. Medicines to treat or prevent infection (antibiotics). Medicines to reduce inflammation (corticosteroids). Hearing aids for hearing loss related to nerve damage. Follow these instructions at home: If you were prescribed an antibiotic medicine, take it as told by your health care provider. Do not stop taking the antibiotic even if you start to feel better. Take over-the-counter and prescription medicines only as told by your health care provider. Avoid loud noises. Return to your normal activities as told by your health care provider. Ask your health care provider what activities are safe for you. Keep all follow-up visits as told by your health care provider. This is important. Contact a health care provider if: You feel dizzy. You develop new symptoms. You vomit or feel nauseous. You have a fever. Get help right away if: You develop sudden changes in your vision. You have severe ear pain. You have new or increased weakness. You have a severe headache. Summary Hearing loss is a decreased ability to hear sounds around you. It can be temporary or permanent. Treatment will depend on the cause of your hearing loss. It may include ear wax removal, medicines, or a hearing aid. Your hearing may partially or completely come back, depending on what caused your hearing loss and how severe it is. Keep all follow-up visits as told by your health care provider. This is important. This information is not intended to  replace advice given to you by your health care provider. Make sure you discuss any questions you have with your health care provider. Document Revised: 01/25/2018 Document Reviewed: 01/25/2018 Elsevier Patient Education  [place], Adult An earache, or ear pain, can be caused by many things, including: An infection. Ear wax buildup. Ear pressure. Something  in the ear that should not be there (foreign body). A sore throat. Tooth problems. Jaw problems. Treatment of the earache will depend on the cause. If the cause is not clear or cannot be determined, you may need to watch your symptoms until your earache goes away or until a cause is found. Follow these instructions at home: Medicines Take or apply over-the-counter and prescription medicines only as told by your health care provider. If you were prescribed an antibiotic medicine, use it as told by your health care provider. Do not stop using the antibiotic even if you start to feel better. Do not put anything in your ear other than medicine that is prescribed by your health care provider. Managing pain If directed, apply heat to the affected area as often as told by your health care provider. Use the heat source that your health care provider recommends, such as a moist heat pack or a heating pad. Place a towel between your skin and the heat source. Leave the heat on for 20-30 minutes. Remove the heat if your skin turns bright red. This is especially important if you are unable to feel pain, heat, or cold. You may have a greater risk of getting burned. If directed, put ice on the affected area as often as told by your health care provider. To do this:   Put ice in a plastic bag. Place a towel between your skin and the bag. Leave the ice on for 20 minutes, 2-3 times a day. General instructions Pay attention to any changes in your symptoms. Try resting in an upright position instead of lying down. This may help to reduce pressure in your ear and relieve pain. Chew gum if it helps to relieve your ear pain. Treat any allergies as told by your health care provider. Drink enough fluid to keep your urine pale yellow. It is up to you to get the results of any tests that were done. Ask your health care provider, or the department that is doing the tests, when your results will be ready. Keep all  follow-up visits as told by your health care provider. This is important. Contact a health care provider if: Your pain does not improve within 2 days. Your earache gets worse. You have new symptoms. You have a fever. Get help right away if you: Have a severe headache. Have a stiff neck. Have trouble swallowing. Have redness or swelling behind your ear. Have fluid or blood coming from your ear. Have hearing loss. Feel dizzy. Summary An earache, or ear pain, can be caused by many things. Treatment of the earache will depend on the cause. Follow recommendations from your health care provider to treat your ear pain. If the cause is not clear or cannot be determined, you may need to watch your symptoms until your earache goes away or until a cause is found. Keep all follow-up visits as told by your health care provider. This is important. This information is not intended to replace advice given to you by your health care provider. Make sure you discuss any questions you have with your health care provider. Document Revised: 12/03/2018 Document Reviewed: 12/03/2018  Elsevier Patient Education  2022 [place],   Merri Ray, MD [place], [facility] 05/22/21 4:15 PM

## 2021-05-22 NOTE — Addendum Note (Signed)
Addended by: Patrcia Dolly on: 05/22/2021 04:21 PM   Modules accepted: Orders

## 2021-05-22 NOTE — Addendum Note (Signed)
Addended by: Patrcia Dolly on: 05/22/2021 04:27 PM   Modules accepted: Orders

## 2021-05-23 DIAGNOSIS — H6122 Impacted cerumen, left ear: Secondary | ICD-10-CM | POA: Diagnosis not present

## 2021-05-23 DIAGNOSIS — H903 Sensorineural hearing loss, bilateral: Secondary | ICD-10-CM | POA: Diagnosis not present

## 2021-05-23 DIAGNOSIS — K029 Dental caries, unspecified: Secondary | ICD-10-CM | POA: Insufficient documentation

## 2021-05-23 DIAGNOSIS — H9201 Otalgia, right ear: Secondary | ICD-10-CM | POA: Insufficient documentation

## 2021-05-23 DIAGNOSIS — H9113 Presbycusis, bilateral: Secondary | ICD-10-CM | POA: Insufficient documentation

## 2021-05-23 DIAGNOSIS — R198 Other specified symptoms and signs involving the digestive system and abdomen: Secondary | ICD-10-CM | POA: Diagnosis not present

## 2021-06-02 DIAGNOSIS — H524 Presbyopia: Secondary | ICD-10-CM | POA: Diagnosis not present

## 2021-06-02 DIAGNOSIS — H2513 Age-related nuclear cataract, bilateral: Secondary | ICD-10-CM | POA: Diagnosis not present

## 2021-06-02 DIAGNOSIS — H5203 Hypermetropia, bilateral: Secondary | ICD-10-CM | POA: Diagnosis not present

## 2021-06-06 ENCOUNTER — Telehealth: Payer: Self-pay | Admitting: Family Medicine

## 2021-06-06 NOTE — Telephone Encounter (Signed)
Left message for patient to call back and schedule Medicare Annual Wellness Visit (AWV) in office.  ° °If not able to come in office, please offer to do virtually or by telephone.  Left office number and my jabber #[phone]. ° °Due for AWVI ° °Please schedule at anytime with Nurse Health Advisor. °  °

## 2021-06-16 ENCOUNTER — Ambulatory Visit: Payer: Medicare Other | Admitting: Cardiology

## 2021-06-24 DIAGNOSIS — M84372A Stress fracture, left ankle, initial encounter for fracture: Secondary | ICD-10-CM | POA: Diagnosis not present

## 2021-06-24 DIAGNOSIS — M722 Plantar fascial fibromatosis: Secondary | ICD-10-CM | POA: Diagnosis not present

## 2021-06-24 DIAGNOSIS — G5762 Lesion of plantar nerve, left lower limb: Secondary | ICD-10-CM | POA: Diagnosis not present

## 2021-06-24 DIAGNOSIS — M79672 Pain in left foot: Secondary | ICD-10-CM | POA: Diagnosis not present

## 2021-07-07 ENCOUNTER — Ambulatory Visit: Payer: Medicare Other | Admitting: Family Medicine

## 2021-07-21 ENCOUNTER — Encounter: Payer: Self-pay | Admitting: Family Medicine

## 2021-07-21 ENCOUNTER — Ambulatory Visit (INDEPENDENT_AMBULATORY_CARE_PROVIDER_SITE_OTHER): Payer: Medicare Other | Admitting: Family Medicine

## 2021-07-21 VITALS — BP 132/66 | HR 62 | Temp 98.1°F | Resp 18 | Ht 74.0 in | Wt 287.4 lb

## 2021-07-21 DIAGNOSIS — Z6836 Body mass index (BMI) 36.0-36.9, adult: Secondary | ICD-10-CM

## 2021-07-21 DIAGNOSIS — I10 Essential (primary) hypertension: Secondary | ICD-10-CM | POA: Diagnosis not present

## 2021-07-21 DIAGNOSIS — E785 Hyperlipidemia, unspecified: Secondary | ICD-10-CM | POA: Diagnosis not present

## 2021-07-21 DIAGNOSIS — E669 Obesity, unspecified: Secondary | ICD-10-CM

## 2021-07-21 NOTE — Progress Notes (Signed)
Subjective:  [name], male    DOB: 1955-12-19  Age: 66 y.o. MRN: [id number]  CC:  Chief Complaint  Patient presents with   Follow-up    Patient states he is here for follow up on weight loss but it's not going well at this time and possible lab work.    HPI Jonathan Dyer presents for   Hyperlipidemia: Statin recommended, had myalgias with lipitor. He would like to wait and discuss with cardiology.  Lab Results  Component Value Date   CHOL 237 (H) 04/01/2021   HDL 40.30 04/01/2021   LDLCALC 172 (H) 04/01/2021   TRIG 122.0 04/01/2021   CHOLHDL 6 04/01/2021   Lab Results  Component Value Date   ALT 11 04/01/2021   AST 15 04/01/2021   ALKPHOS 79 04/01/2021   BILITOT 0.6 04/01/2021    Hypertension: Headaches discussed in December, thought to be possible caffeine related, volume depletion.  Increase hydration, less tea discussed.  Continued on same dose of losartan 100 mg daily, potassium 35mq daily, chlorthalidone 25 mg daily, amlodipine 10 mg daily, flecainide 50 mg daily.  History of CAD, paroxysmal A-fib, OSA, hyperlipidemia and followed by cardiology, Dr. MDomenic Polite  Flecainide for antiarrhythmic.  Furosemide if needed for ankle swelling, every few weeks when discussed in November. HA's resolved.  Home readings: 130-140/60-70.  BP Readings from Last 3 Encounters:  07/21/21 132/66  05/22/21 128/70  04/24/21 136/68   Lab Results  Component Value Date   CREATININE 1.29 04/01/2021    Obesity He has been trying to work on weight loss, gravel bike racer, try to lose weight to help with climbs.  has adjusted diet.  Exercise decreased as in walking boot, followed by [facility], stress injury in heel.  Diet has also been worse. Snacking with some high calorie foods, birthdays with sweets, less active as above.  Some sodas.  Appt with [facility] in October.  [place]  Glucose 86 on 04/01/21.  Body  mass index is 36.9 kg/m.  Wt Readings from Last 3 Encounters:  07/21/21 287 lb 6.4 oz (130.4 kg)  05/22/21 277 lb 6.4 oz (125.8 kg)  04/24/21 276 lb 6.4 oz (125.4 kg)   Lab Results  Component Value Date   HGBA1C 5.5 10/03/2019   Lab Results  Component Value Date   TSH 2.00 04/01/2021       History Patient Active Problem List   Diagnosis Date Noted   Arthritis of carpometacarpal ([facility] joint of left thumb 02/05/2020   Gastroesophageal reflux disease without esophagitis 04/17/2019   Localized, primary osteoarthritis of hand 09/06/2018   Trigger finger of right hand 09/06/2018   Osteoarthritis of carpometacarpal ([facility] joint of thumb 09/06/2018   Renal cyst 07/04/2018   Colonic mass 01/03/2018   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   Screen for colon cancer 12/29/2017   Arthralgia of right foot 08/17/2017   Degeneration of lumbar intervertebral disc 08/04/2017   Osteoarthritis of right knee 07/20/2017   Spinal stenosis 07/20/2017   Hypertensive disorder 01/01/2016   Hx of adenomatous colonic polyps 03/01/2015   Pain in joint, shoulder region 01/26/2012   Impingement syndrome of left shoulder 01/26/2012   Muscle weakness (generalized) 01/26/2012   Hyperlipidemia, mixed 10/11/2009   Obstructive sleep apnea 10/11/2009   PAROXYSMAL ATRIAL FIBRILLATION 10/11/2009   CAD (coronary artery disease), native coronary artery 08/21/2009   Benign essential hypertension 11/09/2008   Past Medical History:  Diagnosis Date   Allergy  Arthritis    Atrial fibrillation ([place])    Diagnosed 09/2009, on flecainide   Bradycardia    Event monitor 2010: HR down to 45, asymptomatic.   Cataract    Coronary atherosclerosis of native coronary artery    Nonobstructive and distal disease by cath 2005.    Essential hypertension    Hyperlipidemia    OSA (obstructive sleep apnea)    Renal cyst    Ringing in ears    Sleep apnea    Past Surgical History:  Procedure Laterality Date   BIOPSY  01/04/2018    Procedure: BIOPSY;  Surgeon: [name], MD;  [facility];  Service: Endoscopy;;  ascending colon    CARDIAC CATHETERIZATION  2005   COLONOSCOPY  05/2006   SLF: 3 mm sigmoid: Tubular adenoma removed.   COLONOSCOPY N/A 05/06/2016   Dr. [name]: two sessile polyps in mid transverse colon and hepatic flexure (tubular adenomas). ext/int hemorrhoids. tortuous left colon.    COLONOSCOPY WITH PROPOFOL N/A 01/04/2018   Procedure: COLONOSCOPY WITH PROPOFOL;  Surgeon: [name], MD;  [facility];  Service: Endoscopy;  Laterality: N/A;  2:30pm   KNEE ARTHROSCOPY     Left anterior cruciate ligament reconstruction     left shoulder surgery     dr . Herbie Baltimore collins    POLYPECTOMY  05/06/2016   Procedure: POLYPECTOMY;  Surgeon: [name], MD;  [facility];  Service: Endoscopy;;  transverse colon polyp, hepatic flexure polyp,    Right shoulder surgery     ROBOT ASSISTED LAPAROSCOPIC NEPHRECTOMY Left 07/04/2018   Procedure: XI ROBOTIC ASSISTED LAPAROSCOPIC RENAL CYST DECORTICATION;  Surgeon: [name], MD;  [facility];  Service: Urology;  Laterality: Left;  3 HRSPROCEDURE: ROBOTIC RENAL CYST DECORTICATION   TONSILLECTOMY     TOTAL KNEE ARTHROPLASTY     Allergies  Allergen Reactions   Clindamycin/Lincomycin Swelling and Other (See Comments)    Mycin drug family    Penicillins Other (See Comments)    Has patient had a PCN reaction causing immediate rash, facial/tongue/throat swelling, SOB or lightheadedness with hypotension: unknown Has patient had a PCN reaction causing severe rash involving mucus membranes or skin necrosis: unknown Has patient had a PCN reaction that required hospitalization unknown Has patient had a PCN reaction occurring within the last 10 years: unknown If all of the above answers are "NO", then may proceed    Pneumococcal 13-Val Conj Vacc Swelling    Patient stated that he has severe swelling in his arm from vaccine and  swollen up to his collar bone. Lasted about 2 days and did not feel well.    Prior to Admission medications   Medication Sig Start Date End Date Taking? Authorizing Provider  amLODipine (NORVASC) 10 MG tablet TAKE 1 TABLET DAILY 07/15/20  Yes Satira Sark, MD  Cholecalciferol (VITAMIN D-3) 125 MCG (5000 UT) TABS Take 2 tablets by mouth daily.   Yes [provider]  flecainide (TAMBOCOR) 50 MG tablet TAKE 1 TABLET TWICE A DAY 07/12/20  Yes Satira Sark, MD  furosemide (LASIX) 40 MG tablet Take 40 mg by mouth daily as needed for fluid.  08/18/13  Yes [provider]  losartan (COZAAR) 100 MG tablet TAKE 1 TABLET DAILY 05/06/21  Yes Satira Sark, MD  omeprazole (PRILOSEC) 20 MG capsule TAKE 1 CAPSULE BY MOUTH  DAILY 06/28/18  Yes Satira Sark, MD  tamsulosin (FLOMAX) 0.4 MG CAPS capsule TAKE 1  CAPSULE BY MOUTH EVERY DAY Patient taking differently: 0.4 mg 3 (three) times a week. 09/18/19  Yes McKenzie, Candee Furbish, MD  chlorthalidone (HYGROTON) 25 MG tablet Take 0.5 tablets (12.5 mg total) by mouth daily. 12/24/20 03/24/21  Satira Sark, MD  doxycycline (VIBRA-TABS) 100 MG tablet Take 1 tablet (100 mg total) by mouth 2 (two) times daily. Patient not taking: Reported on 03/31/2021 11/13/20   Ailene Ards, NP  potassium chloride (KLOR-CON) 10 MEQ tablet Take 1 tablet (10 mEq total) by mouth daily. 12/24/20 03/24/21  Satira Sark, MD  predniSONE (DELTASONE) 20 MG tablet 3 by mouth for 3 days, then 2 by mouth for 2 days, then 1 by mouth for 2 days, then 1/2 by mouth for 2 days. Patient not taking: Reported on 07/21/2021 05/22/21   Wendie Agreste, MD  Testosterone 20 % CREA Apply 100 mg topically 2 (two) times daily. Patient not taking: Reported on 03/31/2021 10/09/20   Doree Albee, MD   Social History   Socioeconomic History   Marital status: Divorced    Spouse name: Not on file   Number of children: 3   Years of education: Not on file   Highest  education level: Not on file  Occupational History   Occupation: PT-custodial work  Tobacco Use   Smoking status: Never   Smokeless tobacco: Never  Vaping Use   Vaping Use: Never used  Substance and Sexual Activity   Alcohol use: No    Alcohol/week: 0.0 standard drinks   Drug use: No   Sexual activity: Not Currently  Other Topics Concern   Not on file  Social History Narrative   Divorced since 1986,married for 10 years.Lives alone.Part time work,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   Social Determinants of Health   Financial Resource Strain: Not on file  Food Insecurity: Not on file  Transportation Needs: Not on file  Physical Activity: Not on file  Stress: Not on file  Social Connections: Not on file  Intimate Partner Violence: Not on file    Review of Systems  Constitutional:  Negative for fatigue and unexpected weight change.  Eyes:  Negative for visual disturbance.  Respiratory:  Negative for cough, chest tightness and shortness of breath.   Cardiovascular:  Negative for chest pain, palpitations and leg swelling.  Gastrointestinal:  Negative for abdominal pain and blood in stool.  Neurological:  Negative for dizziness, light-headedness and headaches.    Objective:   Vitals:   07/21/21 1251  BP: 132/66  Pulse: 62  Resp: 18  Temp: 98.1 F (36.7 C)  TempSrc: Temporal  SpO2: 97%  Weight: 287 lb 6.4 oz (130.4 kg)  Height: '6\' 2"'$  (1.88 m)     Physical Exam Vitals reviewed.  Constitutional:      Appearance: He is well-developed.  HENT:     Head: Normocephalic and atraumatic.  Neck:     Vascular: No carotid bruit or JVD.  Cardiovascular:     Rate and Rhythm: Normal rate and regular rhythm.     Heart sounds: Normal heart sounds. No murmur heard. Pulmonary:     Effort: Pulmonary effort is normal.     Breath sounds: Normal breath sounds. No rales.  Musculoskeletal:     Right lower leg: No edema.     Left lower leg: No edema.  Skin:    General: Skin  is warm and dry.  Neurological:     Mental Status: He is alert and oriented to person, place, and time.  Psychiatric:        Mood and Affect: Mood normal.       Assessment & Plan:  Jonathan Dyer is a 66 y.o. male . Essential hypertension, benign  -Borderline, but anticipate improvement with weight loss.  No med changes for now.  Continue routine follow-up with cardiology  Hyperlipidemia, unspecified hyperlipidemia type  -Discussed most recent labs and recommended statin, especially with his cardiac history.  Intolerant to Lipitor.  Option for other statin versus PCSK9 inhibitor.  He would like to discuss options with cardiology.  Class 2 obesity with body mass index (BMI) of 36.0 to 36.9 in adult, unspecified obesity type, unspecified whether serious comorbidity present  -Unfortunately with recent orthopedic issue has had less exercise/activity, increased snacking, high calorie foods.  Plans to adjust diet, and once foot has improved increase activity.  Recheck 6 months.  Continue follow-up plan with healthy weight [facility] wellness, although that visit is later in the year.  Option of meeting with nutritionist, surgical weight loss specialist.   No orders of the defined types were placed in this encounter.  Patient Instructions  Try to cut back on sweetened beverages, high calorie foods and sweets - especially if less active with left foot.  Blood pressure looks ok - no changes for now as weight improvement should help those readings as well.  I do recommend statin or PCSK9 inhibitor for your cholesterol - can discuss with your heart doctor.   If you have lab work done today you will be contacted with your lab results within the next 2 weeks.  If you have not heard from [place] then please contact us. The fastest way to get your results is to register for My Chart.   IF you received an x-ray today, you will receive an invoice from [facility]. Please contact [facility]  at [phone] with questions or concerns regarding your invoice.   IF you received labwork today, you will receive an invoice from [facility]. Please contact [facility] at [phone] with questions or concerns regarding your invoice.   Our billing staff will not be able to assist you with questions regarding bills from these companies.  You will be contacted with the lab results as soon as they are available. The fastest way to get your results is to activate your My Chart account. Instructions are located on the last page of this paperwork. If you have not heard from [place] regarding the results in 2 weeks, please contact this office.        Signed,   Merri Ray, MD [place] 07/21/21 1:32 PM

## 2021-07-21 NOTE — Patient Instructions (Addendum)
Try to cut back on sweetened beverages, high calorie foods and sweets - especially if less active with left foot.  ?Blood pressure looks ok - no changes for now as weight improvement should help those readings as well.  ?I do recommend statin or PCSK9 inhibitor for your cholesterol - can discuss with your heart doctor.  ? ?If you have lab work done today you will be contacted with your lab results within the next 2 weeks.  If you have not heard from [place] then please contact us. The fastest way to get your results is to register for My Chart. ? ? ?IF you received an x-ray today, you will receive an invoice from [facility]. Please contact [facility] at [phone] with questions or concerns regarding your invoice.  ? ?IF you received labwork today, you will receive an invoice from [facility]. Please contact [facility] at [phone] with questions or concerns regarding your invoice.  ? ?Our billing staff will not be able to assist you with questions regarding bills from these companies. ? ?You will be contacted with the lab results as soon as they are available. The fastest way to get your results is to activate your My Chart account. Instructions are located on the last page of this paperwork. If you have not heard from [place] regarding the results in 2 weeks, please contact this office. ?  ? ? ?

## 2021-07-22 DIAGNOSIS — M722 Plantar fascial fibromatosis: Secondary | ICD-10-CM | POA: Diagnosis not present

## 2021-07-22 DIAGNOSIS — M84372A Stress fracture, left ankle, initial encounter for fracture: Secondary | ICD-10-CM | POA: Diagnosis not present

## 2021-07-22 DIAGNOSIS — G5762 Lesion of plantar nerve, left lower limb: Secondary | ICD-10-CM | POA: Diagnosis not present

## 2021-07-29 DIAGNOSIS — G5762 Lesion of plantar nerve, left lower limb: Secondary | ICD-10-CM | POA: Diagnosis not present

## 2021-07-29 DIAGNOSIS — M722 Plantar fascial fibromatosis: Secondary | ICD-10-CM | POA: Diagnosis not present

## 2021-08-06 ENCOUNTER — Telehealth: Payer: Self-pay | Admitting: Gastroenterology

## 2021-08-06 ENCOUNTER — Ambulatory Visit: Payer: Medicare Other | Admitting: Gastroenterology

## 2021-08-06 ENCOUNTER — Encounter: Payer: Self-pay | Admitting: Gastroenterology

## 2021-08-06 VITALS — BP 128/62 | HR 83 | Temp 97.7°F | Ht 75.0 in | Wt 286.6 lb

## 2021-08-06 DIAGNOSIS — K862 Cyst of pancreas: Secondary | ICD-10-CM

## 2021-08-06 DIAGNOSIS — K219 Gastro-esophageal reflux disease without esophagitis: Secondary | ICD-10-CM

## 2021-08-06 NOTE — Patient Instructions (Signed)
We will do an MRI again in October 2023. After that, it will be October 2025 and October 2027, then you will be done as long as it has been stable! ? ?We will triage for a colonoscopy in 2024. ? ?We can see you back in 2 years! ? ?It was a pleasure to see you today. I want to create trusting relationships with patients to provide genuine, compassionate, and quality care. I value your feedback. If you receive a survey regarding your visit,  I greatly appreciate you taking time to fill this out.  ? ?Annitta Needs, PhD, ANP-BC ?[facility]  ? ?

## 2021-08-06 NOTE — Progress Notes (Signed)
? ? ?Gastroenterology Office Note   ? ? ?Primary Care Physician:  Jonathan Agreste, MD  ?Primary Gastroenterologist: ? ? ?Chief Complaint  ? ?Chief Complaint  ?Patient presents with  ? Follow-up  ?  No current issues.  ? ? ? ?History of Present Illness  ? ?Jonathan Dyer is a 66 y.o. male presenting today in follow-up with a history of chronic GERD, history of adenomas with last colonoscopy in 2019 (ischemic colitis at that time), and surveillance due in 2024, and pancreatic cysts. Most recent MR abdomen Oct 2022 with stable 9 mm cystic lesion and additional smaller cystic lesions in pancreatic tail that are stable. Plan for annual MRI monitor of pancreatic cystic lesions for 5 years, then every 2 years for X 2 per previous recommendations-given age at onset less than 59. Oct 2023 will be 5 years, whereby will go to every 2 year surevillance X 2.  ? ? ?Colonoscopy 2019: normal TI, ischemic colitis. Colonoscopy due in 2024.  ? ?No abdominal pain, N/V, changes in bowel habits, constipation, diarrhea, overt GI bleeding, GERD, dysphagia, unexplained weight loss, lack of appetite, unexplained weight gain.  ? ?He does state there is an area at his rectum that prolapses and itches at times. No pain. No bleeding. Some itching. Feels more towards right side of rectum. Has to wipe more frequently at times to get clean. Declining rectal exam.  ? ? ? ? ?Past Medical History:  ?Diagnosis Date  ? Allergy   ? Arthritis   ? Atrial fibrillation ([place])   ? Diagnosed 09/2009, on flecainide  ? Bradycardia   ? Event monitor 2010: HR down to 45, asymptomatic.  ? Cataract   ? Coronary atherosclerosis of native coronary artery   ? Nonobstructive and distal disease by cath 2005.   ? Essential hypertension   ? Hyperlipidemia   ? OSA (obstructive sleep apnea)   ? Renal cyst   ? Ringing in ears   ? Sleep apnea   ? ? ?Past Surgical History:  ?Procedure Laterality Date  ? BIOPSY  01/04/2018  ? Procedure: BIOPSY;  Surgeon: [name],  MD;  [facility];  Service: Endoscopy;;  ascending colon   ? CARDIAC CATHETERIZATION  2005  ? COLONOSCOPY  05/2006  ? SLF: 3 mm sigmoid: Tubular adenoma removed.  ? COLONOSCOPY N/A 05/06/2016  ? Dr. [name]: two sessile polyps in mid transverse colon and hepatic flexure (tubular adenomas). ext/int hemorrhoids. tortuous left colon.   ? COLONOSCOPY WITH PROPOFOL N/A 01/04/2018  ? Procedure: COLONOSCOPY WITH PROPOFOL;  Surgeon: [name], MD;  [facility];  Service: Endoscopy;  Laterality: N/A;  2:30pm  ? KNEE ARTHROSCOPY    ? Left anterior cruciate ligament reconstruction    ? left shoulder surgery    ? dr . Herbie Baltimore collins   ? POLYPECTOMY  05/06/2016  ? Procedure: POLYPECTOMY;  Surgeon: [name], MD;  [facility];  Service: Endoscopy;;  transverse colon polyp, hepatic flexure polyp,   ? Right shoulder surgery    ? ROBOT ASSISTED LAPAROSCOPIC NEPHRECTOMY Left 07/04/2018  ? Procedure: XI ROBOTIC ASSISTED LAPAROSCOPIC RENAL CYST DECORTICATION;  Surgeon: [name], MD;  [facility];  Service: Urology;  Laterality: Left;  3 HRSPROCEDURE: ROBOTIC RENAL CYST DECORTICATION  ? TONSILLECTOMY    ? TOTAL KNEE ARTHROPLASTY    ? ? ?Current Outpatient Medications  ?Medication Sig Dispense Refill  ? amLODipine (NORVASC) 10 MG tablet TAKE 1 TABLET DAILY 90  tablet 3  ? chlorthalidone (HYGROTON) 25 MG tablet Take 0.5 tablets (12.5 mg total) by mouth daily. 45 tablet 3  ? Cholecalciferol (VITAMIN D-3) 125 MCG (5000 UT) TABS Take 2 tablets by mouth daily.    ? flecainide (TAMBOCOR) 50 MG tablet TAKE 1 TABLET TWICE A DAY 180 tablet 3  ? furosemide (LASIX) 40 MG tablet Take 40 mg by mouth daily as needed for fluid.     ? losartan (COZAAR) 100 MG tablet TAKE 1 TABLET DAILY 90 tablet 3  ? omeprazole (PRILOSEC) 20 MG capsule TAKE 1 CAPSULE BY MOUTH  DAILY 90 capsule 3  ? potassium chloride (KLOR-CON) 10 MEQ tablet Take 1 tablet (10 mEq total) by mouth daily. 90 tablet 3  ? tamsulosin  (FLOMAX) 0.4 MG CAPS capsule TAKE 1 CAPSULE BY MOUTH EVERY DAY (Patient taking differently: 0.4 mg 3 (three) times a week.) 30 capsule 0  ? ?No current facility-administered medications for this visit.  ? ? ?Allergies as of 08/06/2021 - Review Complete 08/06/2021  ?Allergen Reaction Noted  ? Clindamycin/lincomycin Swelling and Other (See Comments) 01/03/2018  ? Penicillins Other (See Comments)   ? Pneumococcal 13-val conj vacc Swelling 08/15/2020  ? ? ?Family History  ?Problem Relation Age of Onset  ? Pulmonary embolism Mother   ? Lung disease Father   ? Hyperlipidemia Brother   ? Hypertension Brother   ? Diabetes Other   ?     Significant family h/o DM  ? Arthritis Sister   ? Colon cancer Neg Hx   ? ? ?Social History  ? ?Socioeconomic History  ? Marital status: Divorced  ?  Spouse name: Not on file  ? Number of children: 3  ? Years of education: Not on file  ? Highest education level: Not on file  ?Occupational History  ? Occupation: PT-custodial work  ?Tobacco Use  ? Smoking status: Never  ? Smokeless tobacco: Never  ?Vaping Use  ? Vaping Use: Never used  ?Substance and Sexual Activity  ? Alcohol use: No  ?  Alcohol/week: 0.0 standard drinks  ? Drug use: No  ? Sexual activity: Not Currently  ?Other Topics Concern  ? Not on file  ?Social History Narrative  ? 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  ? ?Social Determinants of Health  ? ?Financial Resource Strain: Not on file  ?Food Insecurity: Not on file  ?Transportation Needs: Not on file  ?Physical Activity: Not on file  ?Stress: Not on file  ?Social Connections: Not on file  ?Intimate Partner Violence: Not on file  ? ? ? ?Review of Systems  ? ?Gen: Denies any fever, chills, fatigue, weight loss, lack of appetite.  ?CV: Denies chest pain, heart palpitations, peripheral edema, syncope.  ?Resp: Denies shortness of breath at rest or with exertion. Denies wheezing or cough.  ?GI:see HPI  ?GU : Denies  urinary burning, urinary frequency, urinary hesitancy ?MS: Denies joint pain, muscle weakness, cramps, or limitation of movement.  ?Derm: Denies rash, itching, dry skin ?Psych: Denies depression, anxiety, memory loss, and confusion ?Heme: Denies bruising, bleeding, and enlarged lymph nodes. ? ? ?Physical Exam  ? ?BP 128/62 (BP Location: Right Arm, Patient Position: Sitting, Cuff Size: Large)   Pulse 83   Temp 97.7 ?F (36.5 ?C) (Temporal)   Ht '6\' 3"'$  (1.905 m)   Wt 286 lb 9.6 oz (130 kg)   SpO2 93%   BMI 35.82 kg/m?  ?General:   Alert and oriented. Pleasant and cooperative.  Well-nourished and well-developed.  ?Head:  Normocephalic and atraumatic. ?Eyes:  Without icterus ?Abdomen:  +BS, soft, non-tender and non-distended. No HSM noted. No guarding or rebound. No masses appreciated.  ?Rectal:  Declined ?Msk:  Symmetrical without gross deformities. Normal posture. ?Extremities:  Without edema. ?Neurologic:  Alert and  oriented x4;  grossly normal neurologically. ?Skin:  Intact without significant lesions or rashes. ?Psych:  Alert and cooperative. Normal mood and affect. ? ? ?Assessment  ? ?Jorryn Eyob Godlewski is a 66 y.o. male presenting today in follow-up with a history of  chronic GERD, history of adenomas with last colonoscopy in 2019 (ischemic colitis at that time), and surveillance due in 2024, and pancreatic cysts.  ? ?GERD: rarely needs omeprazole. No alarm signs/symptoms. ? ?History of adenomas; surveillance due in 2024. We can triage for this. ? ?History of pancreatic cysts: most recent MR abdomen Oct 2022 with stable 9 mm cystic lesion and additional smaller cystic lesions in pancreatic tail that are stable. Next MRI Oct 2023 this year. If stable, will then be in Oct 2025 and Oct 2027.  ? ?Possible Grade 3 hemorrhoid: declined rectal exam. Description sounds consistent with prolapsing hemorrhoid. No bleeding. Known internal hemorrhoids on prior colonoscopy. Discussed banding. He would like to think about  this and may call prior to the summer, when he will be in [place].  ? ? ? ? ?PLAN  ? ? ?MRI Oct 2023, then Oct 2025 and Oct 2027 ?Continue omeprazole prn ?Colonoscopy 2024, we will triage for this ?Return

## 2021-08-06 NOTE — Telephone Encounter (Signed)
Jonathan Dyer, patient needs triage for Sept 2024. Can you put this on a recall somewhere? Thanks! ?

## 2021-08-07 NOTE — Telephone Encounter (Signed)
Noted.  Placed on recall for nurse visit to arrange TCS Sept 2024. ?

## 2021-08-08 ENCOUNTER — Telehealth: Payer: Self-pay | Admitting: Cardiology

## 2021-08-08 MED ORDER — FLECAINIDE ACETATE 50 MG PO TABS: 50.0000 mg | ORAL_TABLET | Freq: Two times a day (BID) | ORAL | 3 refills | Status: DC

## 2021-08-08 MED ORDER — LOSARTAN POTASSIUM 100 MG PO TABS: 100.0000 mg | ORAL_TABLET | Freq: Every day | ORAL | 3 refills | Status: DC

## 2021-08-08 MED ORDER — AMLODIPINE BESYLATE 10 MG PO TABS: 10.0000 mg | ORAL_TABLET | Freq: Every day | ORAL | 3 refills | Status: DC

## 2021-08-08 NOTE — Telephone Encounter (Signed)
Completed.

## 2021-08-08 NOTE — Telephone Encounter (Signed)
?*  STAT* If patient is at the pharmacy, call can be transferred to refill team. ? ? ?1. Which medications need to be refilled? (please list name of each medication and dose if known)  ?losartan (COZAAR) 100 MG tablet ?amLODipine (NORVASC) 10 MG tablet ?flecainide (TAMBOCOR) 50 MG tablet ? ?2. Which pharmacy/location (including street and city if local pharmacy) is medication to be sent to? [facility] ([facility] ) - [place], [place] ? ?3. Do they need a 30 day or 90 day supply? 90 day ? ? ?Patient states his insurance changed and needs all refills sent to [facility].  ?

## 2021-09-02 DIAGNOSIS — M722 Plantar fascial fibromatosis: Secondary | ICD-10-CM | POA: Diagnosis not present

## 2021-09-02 DIAGNOSIS — G5762 Lesion of plantar nerve, left lower limb: Secondary | ICD-10-CM | POA: Diagnosis not present

## 2021-09-02 DIAGNOSIS — M84372A Stress fracture, left ankle, initial encounter for fracture: Secondary | ICD-10-CM | POA: Diagnosis not present

## 2021-09-02 DIAGNOSIS — S93491A Sprain of other ligament of right ankle, initial encounter: Secondary | ICD-10-CM | POA: Diagnosis not present

## 2021-09-02 DIAGNOSIS — M79671 Pain in right foot: Secondary | ICD-10-CM | POA: Diagnosis not present

## 2021-09-08 ENCOUNTER — Encounter: Payer: Self-pay | Admitting: Cardiology

## 2021-09-08 ENCOUNTER — Ambulatory Visit: Payer: Medicare Other | Admitting: Cardiology

## 2021-09-08 VITALS — BP 126/68 | HR 55 | Ht 75.0 in | Wt 290.0 lb

## 2021-09-08 DIAGNOSIS — I10 Essential (primary) hypertension: Secondary | ICD-10-CM

## 2021-09-08 DIAGNOSIS — I2510 Atherosclerotic heart disease of native coronary artery without angina pectoris: Secondary | ICD-10-CM | POA: Diagnosis not present

## 2021-09-08 DIAGNOSIS — I480 Paroxysmal atrial fibrillation: Secondary | ICD-10-CM

## 2021-09-08 MED ORDER — ROSUVASTATIN CALCIUM 5 MG PO TABS: 5.0000 mg | ORAL_TABLET | Freq: Every day | ORAL | 3 refills | Status: DC

## 2021-09-08 NOTE — Patient Instructions (Addendum)
Medication Instructions:  ?Your physician has recommended you make the following change in your medication:  ?Start crestor every Saturday and Sunday.  If that is tolerated after 3 to 4 weeks go to once daily ?Continue all other medications as directed ? ?Labwork: ?HFP, Lipids within 6 months at [facility] ? ?Testing/Procedures: ?none ? ?Follow-Up: ?Your physician recommends that you schedule a follow-up appointment in: 6 months ? ?Any Other Special Instructions Will Be Listed Below (If Applicable). ? ?If you need a refill on your cardiac medications before your next appointment, please call your pharmacy. ? ?

## 2021-09-08 NOTE — Progress Notes (Signed)
? ? ?Cardiology Office Note ? ?Date: 09/08/2021  ? ?ID: Yahel Fuston, DOB 12-30-1955, MRN [id number] ? ?PCP:  Wendie Agreste, MD  ?Cardiologist:  Rozann Lesches, MD ?Electrophysiologist:  None  ? ?Chief Complaint  ?Patient presents with  ? Cardiac follow-up  ? ? ?History of Present Illness: ?Jonathan Dyer is a 66 y.o. male last seen in August 2022.  He is here for a follow-up visit.  He does not report any progressive palpitations to suggest increasing atrial fibrillation burden.  [place] and will do some cycling while he is out there. ? ?I reviewed his medications.  He continues on flecainide, ECG today shows sinus bradycardia with QRS duration 122 ms and normal QTc. ? ?He does not describe any angina symptoms, had a low risk GXT in 2020, no pro-arrhythmia at that time either. ? ?He states that his PCP recommended statin therapy which is certainly reasonable.  He did have intolerance to Lipitor with substantial weakness and myalgias years ago.  Did tolerate Pravachol although without much improvement in LDL.  I talked with him about trying Crestor and uptitrating if tolerated. ? ?Past Medical History:  ?Diagnosis Date  ? Allergy   ? Arthritis   ? Atrial fibrillation ([place])   ? Diagnosed 09/2009, on flecainide  ? Bradycardia   ? Event monitor 2010: HR down to 45, asymptomatic.  ? Cataract   ? Coronary atherosclerosis of native coronary artery   ? Nonobstructive and distal disease by cath 2005.   ? Essential hypertension   ? Hyperlipidemia   ? OSA (obstructive sleep apnea)   ? Renal cyst   ? Ringing in ears   ? Sleep apnea   ? ? ?Past Surgical History:  ?Procedure Laterality Date  ? BIOPSY  01/04/2018  ? Procedure: BIOPSY;  Surgeon: [name], MD;  [facility]: [facility];  Service: Endoscopy;;  ascending colon   ? CARDIAC CATHETERIZATION  2005  ? COLONOSCOPY  05/2006  ? SLF: 3 mm sigmoid: Tubular adenoma removed.  ? COLONOSCOPY N/A 05/06/2016  ?  Dr. [name]: two sessile polyps in mid transverse colon and hepatic flexure (tubular adenomas). ext/int hemorrhoids. tortuous left colon.   ? COLONOSCOPY WITH PROPOFOL N/A 01/04/2018  ? Procedure: COLONOSCOPY WITH PROPOFOL;  Surgeon: [name], MD;  Location: [facility];  Service: Endoscopy;  Laterality: N/A;  2:30pm  ? KNEE ARTHROSCOPY    ? Left anterior cruciate ligament reconstruction    ? left shoulder surgery    ? dr . Herbie Baltimore collins   ? POLYPECTOMY  05/06/2016  ? Procedure: POLYPECTOMY;  Surgeon: [name], MD;  Location: [facility];  Service: Endoscopy;;  transverse colon polyp, hepatic flexure polyp,   ? Right shoulder surgery    ? ROBOT ASSISTED LAPAROSCOPIC NEPHRECTOMY Left 07/04/2018  ? Procedure: XI ROBOTIC ASSISTED LAPAROSCOPIC RENAL CYST DECORTICATION;  Surgeon: [name], MD;  Location: [facility];  Service: Urology;  Laterality: Left;  3 HRSPROCEDURE: ROBOTIC RENAL CYST DECORTICATION  ? TONSILLECTOMY    ? TOTAL KNEE ARTHROPLASTY    ? ? ?Current Outpatient Medications  ?Medication Sig Dispense Refill  ? amLODipine (NORVASC) 10 MG tablet Take 1 tablet (10 mg total) by mouth daily. 90 tablet 3  ? chlorthalidone (HYGROTON) 25 MG tablet Take 0.5 tablets (12.5 mg total) by mouth daily. 45 tablet 3  ? Coenzyme Q10 (COQ10) 100 MG CAPS Take 1 capsule by  mouth daily.    ? flecainide (TAMBOCOR) 50 MG tablet Take 1 tablet (50 mg total) by mouth 2 (two) times daily. 180 tablet 3  ? furosemide (LASIX) 40 MG tablet Take 40 mg by mouth daily as needed for fluid.     ? losartan (COZAAR) 100 MG tablet Take 1 tablet (100 mg total) by mouth daily. 90 tablet 3  ? Multiple Vitamin (MULTIVITAMIN) tablet Take 1 tablet by mouth daily.    ? omeprazole (PRILOSEC) 20 MG capsule Take 20 mg by mouth as needed.    ? potassium chloride (KLOR-CON) 10 MEQ tablet Take 1 tablet (10 mEq total) by mouth daily. 90 tablet 3  ? rosuvastatin (CRESTOR) 5 MG tablet Take 1 tablet (5 mg total) by mouth daily. 09/08/21 New Start  90 tablet 3  ? tamsulosin (FLOMAX) 0.4 MG CAPS capsule Take 0.4 mg by mouth. Takes occasionally.    ? ?No current facility-administered medications for this visit.  ? ?Allergies:  Clindamycin/lincomycin, Penicillins, and Pneumococcal 13-val conj vacc  ? ?ROS: No syncope. ? ?Physical Exam: ?VS:  BP 126/68   Pulse (!) 55   Ht '6\' 3"'$  (1.905 m)   Wt 290 lb (131.5 kg)   SpO2 94%   BMI 36.25 kg/m? , BMI Body mass index is 36.25 kg/m?. ? ?Wt Readings from Last 3 Encounters:  ?09/08/21 290 lb (131.5 kg)  ?08/06/21 286 lb 9.6 oz (130 kg)  ?07/21/21 287 lb 6.4 oz (130.4 kg)  ?  ?General: Patient appears comfortable at rest. ?Neck: Supple, no elevated JVP or carotid bruits. ?Lungs: Clear to auscultation, nonlabored breathing at rest. ?Cardiac: Regular rate and rhythm, no S3 or significant systolic murmur, no pericardial rub. ?Extremities: No pitting edema. ? ?ECG:  An ECG dated 09/13/2020 was personally reviewed today and demonstrated:  Sinus bradycardia. ? ?Recent Labwork: ?04/01/2021: ALT 11; AST 15; BUN 22; Creatinine, Ser 1.29; Potassium 4.3; Sodium 139; TSH 2.00  ?   ?Component Value Date/Time  ? CHOL 237 (H) 04/01/2021 0813  ? TRIG 122.0 04/01/2021 0813  ? HDL 40.30 04/01/2021 0813  ? CHOLHDL 6 04/01/2021 0813  ? VLDL 24.4 04/01/2021 0813  ? [place] 172 (H) 04/01/2021 0813  ? LDLCALC 131 (H) 10/03/2019 2620  ? ? ?Other Studies Reviewed Today: ? ?Echocardiogram 11/18/2016: ?- Left ventricle: The cavity size was normal. Wall thickness was  ?  increased in a pattern of mild LVH. Systolic function was normal.  ?  The estimated ejection fraction was in the range of 55% to 60%.  ?  Wall motion was normal; there were no regional wall motion  ?  abnormalities. Left ventricular diastolic function parameters  ?  were normal.  ?- Aortic valve: Mildly calcified annulus. Trileaflet; mildly  ?  thickened leaflets. Valve area (VTI): 3.42 cm^2. Valve area  ?  (Vmax): 3.19 cm^2. Valve area (Vmean): 2.93 cm^2.  ?- Mitral valve: Mildly  calcified annulus. Mildly thickened leaflets.  ?- Technically adequate study. ?  ?GXT 01/03/2019: ?Blood pressure demonstrated a hypertensive response to exercise. ?There was no ST segment deviation noted during stress. ?Duke treadmill score portends a low risk of cardiac events. ?  ?Cardiac monitor 09/26/2020: ?ZIO XT reviewed.  6 days 21 hours analyzed.  Predominant rhythm is sinus with heart rate ranging from 45 bpm up to 133 bpm and average heart rate 59 bpm.  There were rare PACs including couplets and triplets representing less than 1% total beats.  There were rare PVCs representing less than 1% total beats.  There was one brief episode of SVT lasting only 6 beats, otherwise no significant arrhythmias or pauses. ? ?Assessment and Plan: ? ?1.  Paroxysmal atrial fibrillation with CHA2DS2-VASc score of 3.  He prefers not to pursue anticoagulation and has had fairly good rhythm control on flecainide at baseline.  He is not on any AV nodal blockers given resting bradycardia. ? ?2.  History of previously documented mild coronary atherosclerosis.  Low risk GXT noted in 2020.  No active angina.  Agree with addition of statin therapy.  We will start Crestor 5 mg twice weekly and if tolerated go to once weekly.  This can be further uptitrated over time.  Check FLP and LFTs for next visit. ? ?3.  Essential hypertension, currently on Norvasc, chlorthalidone, and Cozaar.  Blood pressure control is adequate today. ? ?Medication Adjustments/Labs and Tests Ordered: ?Current medicines are reviewed at length with the patient today.  Concerns regarding medicines are outlined above.  ? ?Tests Ordered: ?Orders Placed This Encounter  ?Procedures  ? Lipid panel  ? Hepatic function panel  ? EKG 12-Lead  ? ? ?Medication Changes: ?Meds ordered this encounter  ?Medications  ? rosuvastatin (CRESTOR) 5 MG tablet  ?  Sig: Take 1 tablet (5 mg total) by mouth daily. 09/08/21 New Start  ?  Dispense:  90 tablet  ?  Refill:  3  ? ? ?Disposition:   Follow up  6 months. ? ?Signed, ?Satira Sark, MD, [facility] ?09/08/2021 4:31 PM    ?[facility] ?[address], [place], [place] [address] ?Phone: [phone]; Fax: [phone]-

## 2021-09-09 DIAGNOSIS — R3912 Poor urinary stream: Secondary | ICD-10-CM | POA: Diagnosis not present

## 2021-10-03 ENCOUNTER — Other Ambulatory Visit: Payer: Self-pay | Admitting: *Deleted

## 2021-10-03 MED ORDER — CHLORTHALIDONE 25 MG PO TABS: 12.5000 mg | ORAL_TABLET | Freq: Every day | ORAL | 1 refills | Status: DC

## 2021-10-03 MED ORDER — ROSUVASTATIN CALCIUM 5 MG PO TABS
5.0000 mg | ORAL_TABLET | Freq: Every day | ORAL | 1 refills | Status: DC
Start: 2021-10-03 — End: 2022-01-05

## 2021-10-03 MED ORDER — POTASSIUM CHLORIDE ER 10 MEQ PO TBCR: 10.0000 meq | EXTENDED_RELEASE_TABLET | Freq: Every day | ORAL | 1 refills | Status: DC

## 2021-10-06 DIAGNOSIS — H6993 Unspecified Eustachian tube disorder, bilateral: Secondary | ICD-10-CM | POA: Diagnosis not present

## 2021-10-06 DIAGNOSIS — J019 Acute sinusitis, unspecified: Secondary | ICD-10-CM | POA: Diagnosis not present

## 2021-10-18 DIAGNOSIS — I480 Paroxysmal atrial fibrillation: Secondary | ICD-10-CM | POA: Diagnosis not present

## 2021-10-18 DIAGNOSIS — G4730 Sleep apnea, unspecified: Secondary | ICD-10-CM | POA: Diagnosis not present

## 2021-10-20 ENCOUNTER — Other Ambulatory Visit: Payer: Self-pay | Admitting: *Deleted

## 2021-10-20 MED ORDER — FLECAINIDE ACETATE 50 MG PO TABS: 50.0000 mg | ORAL_TABLET | Freq: Two times a day (BID) | ORAL | 0 refills | Status: DC

## 2021-11-12 ENCOUNTER — Other Ambulatory Visit: Payer: Self-pay | Admitting: Cardiology

## 2021-11-25 DIAGNOSIS — R079 Chest pain, unspecified: Secondary | ICD-10-CM | POA: Diagnosis not present

## 2021-11-25 DIAGNOSIS — Z20822 Contact with and (suspected) exposure to covid-19: Secondary | ICD-10-CM | POA: Diagnosis not present

## 2021-11-25 DIAGNOSIS — R42 Dizziness and giddiness: Secondary | ICD-10-CM | POA: Diagnosis not present

## 2021-11-26 DIAGNOSIS — R42 Dizziness and giddiness: Secondary | ICD-10-CM | POA: Diagnosis not present

## 2021-12-02 DIAGNOSIS — R42 Dizziness and giddiness: Secondary | ICD-10-CM | POA: Diagnosis not present

## 2022-01-04 ENCOUNTER — Other Ambulatory Visit: Payer: Self-pay | Admitting: Cardiology

## 2022-01-15 ENCOUNTER — Telehealth: Payer: Self-pay

## 2022-01-15 ENCOUNTER — Encounter (INDEPENDENT_AMBULATORY_CARE_PROVIDER_SITE_OTHER): Payer: Medicare Other | Admitting: Family Medicine

## 2022-01-15 NOTE — Telephone Encounter (Signed)
Called patient to schedule Annual Wellness Visit, requested the patient call back in order to schedule appropriate type.

## 2022-01-19 DIAGNOSIS — Z0289 Encounter for other administrative examinations: Secondary | ICD-10-CM

## 2022-01-27 ENCOUNTER — Encounter (INDEPENDENT_AMBULATORY_CARE_PROVIDER_SITE_OTHER): Payer: Self-pay | Admitting: Family Medicine

## 2022-01-27 ENCOUNTER — Ambulatory Visit (INDEPENDENT_AMBULATORY_CARE_PROVIDER_SITE_OTHER): Payer: Medicare Other | Admitting: Family Medicine

## 2022-01-27 VITALS — BP 139/74 | HR 61 | Temp 97.8°F | Ht 75.0 in | Wt 274.0 lb

## 2022-01-27 DIAGNOSIS — E7849 Other hyperlipidemia: Secondary | ICD-10-CM

## 2022-01-27 DIAGNOSIS — Z1331 Encounter for screening for depression: Secondary | ICD-10-CM | POA: Diagnosis not present

## 2022-01-27 DIAGNOSIS — Z6834 Body mass index (BMI) 34.0-34.9, adult: Secondary | ICD-10-CM

## 2022-01-27 DIAGNOSIS — R0602 Shortness of breath: Secondary | ICD-10-CM

## 2022-01-27 DIAGNOSIS — R7303 Prediabetes: Secondary | ICD-10-CM | POA: Diagnosis not present

## 2022-01-27 DIAGNOSIS — R5383 Other fatigue: Secondary | ICD-10-CM

## 2022-01-27 DIAGNOSIS — E559 Vitamin D deficiency, unspecified: Secondary | ICD-10-CM

## 2022-01-27 DIAGNOSIS — I10 Essential (primary) hypertension: Secondary | ICD-10-CM | POA: Diagnosis not present

## 2022-01-27 DIAGNOSIS — E669 Obesity, unspecified: Secondary | ICD-10-CM

## 2022-01-27 DIAGNOSIS — E7800 Pure hypercholesterolemia, unspecified: Secondary | ICD-10-CM | POA: Diagnosis not present

## 2022-01-28 ENCOUNTER — Encounter: Payer: Self-pay | Admitting: *Deleted

## 2022-01-28 ENCOUNTER — Telehealth: Payer: Self-pay | Admitting: *Deleted

## 2022-01-28 NOTE — Telephone Encounter (Signed)
On recall for mri pancreas

## 2022-01-28 NOTE — Telephone Encounter (Signed)
Mailed letter °

## 2022-01-29 LAB — COMPREHENSIVE METABOLIC PANEL
ALT: 28 IU/L (ref 0–44)
AST: 28 IU/L (ref 0–40)
Albumin/Globulin Ratio: 1.7 (ref 1.2–2.2)
Albumin: 4.7 g/dL (ref 3.9–4.9)
Alkaline Phosphatase: 106 IU/L (ref 44–121)
BUN/Creatinine Ratio: 12 (ref 10–24)
BUN: 15 mg/dL (ref 8–27)
Bilirubin Total: 0.4 mg/dL (ref 0.0–1.2)
CO2: 25 mmol/L (ref 20–29)
Calcium: 9.8 mg/dL (ref 8.6–10.2)
Chloride: 100 mmol/L (ref 96–106)
Creatinine, Ser: 1.23 mg/dL (ref 0.76–1.27)
Globulin, Total: 2.7 g/dL (ref 1.5–4.5)
Glucose: 90 mg/dL (ref 70–99)
Potassium: 4.8 mmol/L (ref 3.5–5.2)
Sodium: 140 mmol/L (ref 134–144)
Total Protein: 7.4 g/dL (ref 6.0–8.5)
eGFR: 64 mL/min/{1.73_m2} (ref >59)

## 2022-01-29 LAB — TSH: TSH: 1.89 u[IU]/mL (ref 0.450–4.500)

## 2022-01-29 LAB — VITAMIN D 25 HYDROXY (VIT D DEFICIENCY, FRACTURES): Vit D, 25-Hydroxy: 39.9 ng/mL (ref 30.0–100.0)

## 2022-01-29 LAB — LIPID PANEL WITH LDL/HDL RATIO
Cholesterol, Total: 215 mg/dL — ABNORMAL HIGH (ref 100–199)
HDL: 52 mg/dL (ref >39)
LDL Chol Calc (NIH): 132 mg/dL — ABNORMAL HIGH (ref 0–99)
LDL/HDL Ratio: 2.5 ratio (ref 0.0–3.6)
Triglycerides: 172 mg/dL — ABNORMAL HIGH (ref 0–149)
VLDL Cholesterol Cal: 31 mg/dL (ref 5–40)

## 2022-01-29 LAB — T4, FREE: Free T4: 0.94 ng/dL (ref 0.82–1.77)

## 2022-01-29 LAB — HEMOGLOBIN A1C
Est. average glucose Bld gHb Est-mCnc: 126 mg/dL
Hgb A1c MFr Bld: 6 % — ABNORMAL HIGH (ref 4.8–5.6)

## 2022-01-29 LAB — INSULIN, RANDOM: INSULIN: 12.6 u[IU]/mL (ref 2.6–24.9)

## 2022-01-29 LAB — T3: T3, Total: 117 ng/dL (ref 71–180)

## 2022-01-30 ENCOUNTER — Other Ambulatory Visit: Payer: Self-pay | Admitting: *Deleted

## 2022-01-30 ENCOUNTER — Telehealth: Payer: Self-pay | Admitting: Cardiology

## 2022-01-30 MED ORDER — OMEPRAZOLE 20 MG PO CPDR: 20.0000 mg | DELAYED_RELEASE_CAPSULE | Freq: Every day | ORAL | 0 refills | Status: DC | PRN

## 2022-01-30 MED ORDER — FUROSEMIDE 40 MG PO TABS: 40.0000 mg | ORAL_TABLET | Freq: Every day | ORAL | 0 refills | Status: DC | PRN

## 2022-01-30 MED ORDER — OMEPRAZOLE 20 MG PO CPDR: 20.0000 mg | DELAYED_RELEASE_CAPSULE | ORAL | 0 refills | Status: DC | PRN

## 2022-01-30 NOTE — Telephone Encounter (Signed)
Done. Patient aware.

## 2022-01-30 NOTE — Telephone Encounter (Signed)
*  STAT* If patient is at the pharmacy, call can be transferred to refill team.   1. Which medications need to be refilled? (please list name of each medication and dose if known) furosemide (LASIX) 40 MG tablet  omeprazole (PRILOSEC) 20 MG capsule  2. Which pharmacy/location (including street and city if local pharmacy) is medication to be sent to? [facility] ([facility]) - [place], [place]  3. Do they need a 30 day or 90 day supply? [place]

## 2022-01-30 NOTE — Telephone Encounter (Signed)
Good Morning,   This patient is a patient of Dr. Domenic Polite requesting refills. Thank you so much.

## 2022-02-02 ENCOUNTER — Telehealth: Payer: Self-pay | Admitting: *Deleted

## 2022-02-02 ENCOUNTER — Other Ambulatory Visit: Payer: Self-pay | Admitting: *Deleted

## 2022-02-02 DIAGNOSIS — K862 Cyst of pancreas: Secondary | ICD-10-CM

## 2022-02-02 NOTE — Progress Notes (Signed)
Chief Complaint:   OBESITY Jonathan Dyer (MR# [id number]) is a 66 y.o. male who presents for evaluation and treatment of obesity and related comorbidities. Current BMI is Body mass index is 34.25 kg/m. Jonathan Dyer has been struggling with his weight for many years and has been unsuccessful in either losing weight, maintaining weight loss, or reaching his healthy weight goal.  Jonathan Dyer was referred by Dr. Carlota Raspberry. Eating out about 10x/ week. Previously did well on [employer] transformat program. Skips breakfast or lunch 1-2x a week. Breakfast is Bowl [place] Clap or [employer] (satisfied). Snack--protein bar or honey bun (just wants to snack). Lunch-- burger, 6-8 oz+veggie ( green bean) (satisfied). Snack--PB crackers or ice cream or cookies. Dinner--chicken or hamburger or [place] burger 8oz, potato or sweet potato, green beans or salad.  Jonathan Dyer is currently in the action stage of change and ready to dedicate time achieving and maintaining a healthier weight. Jonathan Dyer is interested in becoming our patient and working on intensive lifestyle modifications including (but not limited to) diet and exercise for weight loss.  Jonathan Dyer's habits were reviewed today and are as follows: his desired weight loss is 65 lbs, he started gaining weight when he damaged his knee, his heaviest weight ever was 305 pounds, he snacks frequently in the evenings, he skips meals frequently, he frequently makes poor food choices, and he frequently eats larger portions than normal.  Depression Screen [name]'s Food and Mood (modified PHQ-9) score was 3.     01/27/2022    9:16 AM  Depression screen PHQ 2/9  Decreased Interest 0  Down, Depressed, Hopeless 0  PHQ - 2 Score 0  Altered sleeping 0  Tired, decreased energy 1  Change in appetite 1  Feeling bad or failure about yourself  0  Trouble concentrating 1  Moving slowly or fidgety/restless 0  Suicidal thoughts 0  PHQ-9 Score 3  Difficult doing work/chores Somewhat difficult    Subjective:   1. Other fatigue EKG--Sinus bradycardia. Reco admits to daytime somnolence and  being in between  waking up still tired. Patient has a history of symptoms of morning fatigue and morning headache. Jonathan Dyer generally gets  7-10  hours of sleep per night, and states that he has generally restful sleep. Snoring is not present. Apneic episodes are not present. Epworth Sleepiness Score is 6.    2. SOBOE (shortness of breath on exertion) Jonathan Dyer notes increasing shortness of breath with exercising and seems to be worsening over time with weight gain. He notes getting out of breath sooner with activity than he used to. This has not gotten worse recently. Jonathan Dyer denies shortness of breath at rest or orthopnea.   3. Essential hypertension Jonathan Dyer" blood pressure elevated since age 66. He is on Losartan, Amlodipine, Chlorthalidone. Blood pressure controlled today.  4. Prediabetes Jonathan Dyer was diagnosed 2 years ago by [name]. A1c 5.7 3 years ago.  5. Other hyperlipidemia Jonathan Dyer's last LDL Nov 2022.170, HDL 40, Trigly 122.  6. Vitamin D deficiency Jonathan Dyer is on daily Vit D. Denies any nausea, vomiting or muscle weakness. He notes fatigue.  Assessment/Plan:   1. Other fatigue We will obtain labs today. [name] does feel that his weight is causing his energy to be lower than it should be. Fatigue may be related to obesity, depression or many other causes. Labs will be ordered, and in the meanwhile, [name] will focus on self care including making healthy food choices, increasing physical activity and focusing on stress reduction.   -  TSH - T4, free - T3  2. SOBOE (shortness of breath on exertion) Bhavin does feel that he gets out of breath more easily that he used to when he exercises. Saleem's shortness of breath appears to be obesity related and exercise induced. He has agreed to work on weight loss and gradually increase exercise to treat his exercise induced shortness of breath. Will continue to monitor  closely.   3. Essential hypertension We will obtain labs today.  4. Prediabetes We will obtain labs today.  - Comprehensive metabolic panel - Hemoglobin A1c - Insulin, random  5. Other hyperlipidemia We will obtain labs today.  - Lipid Panel With LDL/HDL Ratio  6. Vitamin D deficiency We will obtain labs today.  - VITAMIN D 25 Hydroxy (Vit-D Deficiency, Fractures)  7. Depression screening Behavior modification techniques were discussed today to help Jonathan Dyer deal with his emotional/non-hunger eating behaviors.  Orders and follow up as documented in patient record.    8. Class 1 obesity with serious comorbidity and body mass index (BMI) of 34.0 to 34.9 in adult, unspecified obesity type [name] is currently in the action stage of change and his goal is to continue with weight loss efforts. I recommend Jonathan Dyer begin the structured treatment plan as follows:  He has agreed to the Category 3 Plan.  Exercise goals: No exercise has been prescribed at this time.   Behavioral modification strategies: increasing lean protein intake, meal planning and cooking strategies, keeping healthy foods in the home, and planning for success.  He was informed of the importance of frequent follow-up visits to maximize his success with intensive lifestyle modifications for his multiple health conditions. He was informed we would discuss his lab results at his next visit unless there is a critical issue that needs to be addressed sooner. Jonathan Dyer agreed to keep his next visit at the agreed upon time to discuss these results.  Objective:   Blood pressure 139/74, pulse 61, temperature 97.8 F (36.6 C), height '6\' 3"'$  (1.905 m), weight 274 lb (124.3 kg), SpO2 98 %. Body mass index is 34.25 kg/m.  EKG: Normal sinus rhythm, rate 53 bpm.  Indirect Calorimeter completed today shows a VO2 of 251 ml and a REE of 1728.  His calculated basal metabolic rate is 6160 thus his basal metabolic rate is worse than  expected.  General: Cooperative, alert, well developed, in no acute distress. HEENT: Conjunctivae and lids unremarkable. Cardiovascular: Regular rhythm.  Lungs: Normal work of breathing. Neurologic: No focal deficits.   Lab Results  Component Value Date   CREATININE 1.23 01/27/2022   BUN 15 01/27/2022   NA 140 01/27/2022   K 4.8 01/27/2022   CL 100 01/27/2022   CO2 25 01/27/2022   Lab Results  Component Value Date   ALT 28 01/27/2022   AST 28 01/27/2022   ALKPHOS 106 01/27/2022   BILITOT 0.4 01/27/2022   Lab Results  Component Value Date   HGBA1C 6.0 (H) 01/27/2022   HGBA1C 5.5 10/03/2019   HGBA1C 5.7 10/21/2018   Lab Results  Component Value Date   INSULIN 12.6 01/27/2022   Lab Results  Component Value Date   TSH 1.890 01/27/2022   Lab Results  Component Value Date   CHOL 215 (H) 01/27/2022   HDL 52 01/27/2022   LDLCALC 132 (H) 01/27/2022   TRIG 172 (H) 01/27/2022   CHOLHDL 6 04/01/2021   Lab Results  Component Value Date   WBC 4.9 10/21/2018   HGB 14.8 10/21/2018  HCT 45 10/21/2018   MCV 91.5 06/28/2018   PLT 271 10/21/2018   No results found for: "IRON", "TIBC", "FERRITIN"  Attestation Statements:   Reviewed by clinician on day of visit: allergies, medications, problem list, medical history, surgical history, family history, social history, and previous encounter notes.  Time spent on visit including pre-visit chart review and post-visit charting and care was 45 minutes.   I, Elnora Morrison, RMA am acting as transcriptionist for Coralie Common, MD.  This is the patient's first visit at [facility]. The patient's NEW PATIENT PACKET was reviewed at length. Included in the packet: current and past health history, medications, allergies, ROS, gynecologic history (women only), surgical history, family history, social history, weight history, weight loss surgery history (for those that have had weight loss surgery), nutritional  evaluation, mood and food questionnaire, PHQ9, Epworth questionnaire, sleep habits questionnaire, patient life and health improvement goals questionnaire. These will all be scanned into the patient's chart under media.   During the visit, I independently reviewed the patient's EKG, bioimpedance scale results, and indirect calorimeter results. I used this information to tailor a meal plan for the patient that will help him to lose weight and will improve his obesity-related conditions going forward. I performed a medically necessary appropriate examination and/or evaluation. I discussed the assessment and treatment plan with the patient. The patient was provided an opportunity to ask questions and all were answered. The patient agreed with the plan and demonstrated an understanding of the instructions. Labs were ordered at this visit and will be reviewed at the next visit unless more critical results need to be addressed immediately. Clinical information was updated and documented in the EMR.   Time spent on visit including pre-visit chart review and post-visit care was 45 minutes.    I have reviewed the above documentation for accuracy and completeness, and I agree with the above. - Coralie Common, MD

## 2022-02-02 NOTE — Telephone Encounter (Signed)
Pt informed that MRI Abdomen Pelvis W/WO contrast is scheduled for 02/19/22 at 7:00 am, arrive at 6:45 am, nothing to eat or drink 4 hours prior to procedure. Verbalized understanding.

## 2022-02-02 NOTE — Telephone Encounter (Signed)
Case Number: [id number] Review Date: 02/02/2022 1:29:34 PM  This member's benefit plan did not require a prior authorization for this request.

## 2022-02-04 ENCOUNTER — Ambulatory Visit: Payer: Medicare Other | Admitting: Family Medicine

## 2022-02-09 DIAGNOSIS — M13851 Other specified arthritis, right hip: Secondary | ICD-10-CM | POA: Diagnosis not present

## 2022-02-09 DIAGNOSIS — M7061 Trochanteric bursitis, right hip: Secondary | ICD-10-CM | POA: Diagnosis not present

## 2022-02-10 ENCOUNTER — Encounter (INDEPENDENT_AMBULATORY_CARE_PROVIDER_SITE_OTHER): Payer: Self-pay | Admitting: Family Medicine

## 2022-02-10 ENCOUNTER — Other Ambulatory Visit: Payer: Self-pay | Admitting: Urology

## 2022-02-10 ENCOUNTER — Ambulatory Visit (INDEPENDENT_AMBULATORY_CARE_PROVIDER_SITE_OTHER): Payer: Medicare Other | Admitting: Family Medicine

## 2022-02-10 VITALS — BP 111/61 | HR 56 | Temp 97.6°F | Ht 75.0 in | Wt 270.0 lb

## 2022-02-10 DIAGNOSIS — Z6833 Body mass index (BMI) 33.0-33.9, adult: Secondary | ICD-10-CM

## 2022-02-10 DIAGNOSIS — E66811 Obesity, class 1: Secondary | ICD-10-CM

## 2022-02-10 DIAGNOSIS — Z6834 Body mass index (BMI) 34.0-34.9, adult: Secondary | ICD-10-CM

## 2022-02-10 DIAGNOSIS — E7849 Other hyperlipidemia: Secondary | ICD-10-CM

## 2022-02-10 DIAGNOSIS — I10 Essential (primary) hypertension: Secondary | ICD-10-CM | POA: Diagnosis not present

## 2022-02-10 DIAGNOSIS — R972 Elevated prostate specific antigen [PSA]: Secondary | ICD-10-CM

## 2022-02-10 DIAGNOSIS — R7303 Prediabetes: Secondary | ICD-10-CM

## 2022-02-10 DIAGNOSIS — E559 Vitamin D deficiency, unspecified: Secondary | ICD-10-CM | POA: Diagnosis not present

## 2022-02-10 DIAGNOSIS — E669 Obesity, unspecified: Secondary | ICD-10-CM

## 2022-02-10 MED ORDER — VITAMIN D (ERGOCALCIFEROL) 1.25 MG (50000 UNIT) PO CAPS: 50000.0000 [IU] | ORAL_CAPSULE | ORAL | 0 refills | Status: DC

## 2022-02-10 NOTE — Telephone Encounter (Signed)
Please advise 

## 2022-02-11 NOTE — Progress Notes (Signed)
Chief Complaint:   OBESITY Jonathan Dyer is here to discuss his progress with his obesity treatment plan along with follow-up of his obesity related diagnoses. Jonathan Dyer is on the Category 3 Plan and states he is following his eating plan approximately 82-83% of the time. Jonathan Dyer states he is bike riding 60 minutes 2-3 times per week.  Today's visit was #: 2 Starting weight: 274 lbs Starting date: 01/27/2022 Today's weight: 270 lbs Today's date: 02/10/2022 Total lbs lost to date: 4 lbs Total lbs lost since last in-office visit: 4  Interim History: Jonathan Dyer has been riding his bike and working over the last few weeks. He felt satisfied with meal options and was eating all of each meal. He is asking about Jonathan Dyer breakfast bowls for breakfast and wondering how to incorporate these. Weighing out protein for supper. Snack calories have been 200 calorie Clean bars.   Subjective:   1. Essential hypertension Jonathan Dyer's blood pressure very well controlled today. Denies chest pain, chest pressure and headache. He is on Amlodipine and Cozaar.    2. Other hyperlipidemia Labs discussed during visit today. Jonathan Dyer reports he missed the summer taking statin. History of Afib. He is on Crestor 5 mg.  3. Prediabetes A1c 6.0, insulin 12.6. Not taking medication. Reports diagnosed previously.   4. Vitamin D deficiency Jonathan Dyer is on Vit D 5k IU a day. His Vit D level of 39.9. Denies any nausea, vomiting or muscle weakness. He notes fatigue.  Assessment/Plan:   1. Essential hypertension Will follow up with blood pressure at next appointment-- may want to decrease Norvasc if blood pressure stays controlled.   2. Other hyperlipidemia Will repeat labs in 3 months.   3. Prediabetes Will repeat labs in 3 months. Continue wit Cat 3.   4. Vitamin D deficiency START Vit D 50k IU once a week for 1 month with 0 refills. STOP taking over the counter Vit D.  -Start Vitamin D, Ergocalciferol, (DRISDOL) 1.25 MG (50000 UNIT) CAPS  capsule; Take 1 capsule (50,000 Units total) by mouth every 7 (seven) days.  Dispense: 4 capsule; Refill: 0  5. Obesity with current BMI of 33.8 Jonathan Dyer is currently in the action stage of change. As such, his goal is to continue with weight loss efforts. He has agreed to the Category 3 Plan.   Exercise goals: As is.  Behavioral modification strategies: increasing lean protein intake, meal planning and cooking strategies, keeping healthy foods in the home, and planning for success.  Jonathan Dyer has agreed to follow-up with our clinic in 3 weeks. He was informed of the importance of frequent follow-up visits to maximize his success with intensive lifestyle modifications for his multiple health conditions.   Objective:   Blood pressure 111/61, pulse (!) 56, temperature 97.6 F (36.4 C), height '6\' 3"'$  (1.905 m), weight 270 lb (122.5 kg), SpO2 97 %. Body mass index is 33.75 kg/m.  General: Cooperative, alert, well developed, in no acute distress. HEENT: Conjunctivae and lids unremarkable. Cardiovascular: Regular rhythm.  Lungs: Normal work of breathing. Neurologic: No focal deficits.   Lab Results  Component Value Date   CREATININE 1.23 01/27/2022   BUN 15 01/27/2022   NA 140 01/27/2022   K 4.8 01/27/2022   CL 100 01/27/2022   CO2 25 01/27/2022   Lab Results  Component Value Date   ALT 28 01/27/2022   AST 28 01/27/2022   ALKPHOS 106 01/27/2022   BILITOT 0.4 01/27/2022   Lab Results  Component Value Date  HGBA1C 6.0 (H) 01/27/2022   HGBA1C 5.5 10/03/2019   HGBA1C 5.7 10/21/2018   Lab Results  Component Value Date   INSULIN 12.6 01/27/2022   Lab Results  Component Value Date   TSH 1.890 01/27/2022   Lab Results  Component Value Date   CHOL 215 (H) 01/27/2022   HDL 52 01/27/2022   LDLCALC 132 (H) 01/27/2022   TRIG 172 (H) 01/27/2022   CHOLHDL 6 04/01/2021   Lab Results  Component Value Date   VD25OH 39.9 01/27/2022   VD25OH 30.49 04/01/2021   VD25OH 34 12/27/2019    Lab Results  Component Value Date   WBC 4.9 10/21/2018   HGB 14.8 10/21/2018   HCT 45 10/21/2018   MCV 91.5 06/28/2018   PLT 271 10/21/2018   No results found for: "IRON", "TIBC", "FERRITIN"  Attestation Statements:   Reviewed by clinician on day of visit: allergies, medications, problem list, medical history, surgical history, family history, social history, and previous encounter notes.  Time spent on visit including pre-visit chart review and post-visit care and charting was 45 minutes.   I, Elnora Morrison, RMA am acting as transcriptionist for Coralie Common, MD.  I have reviewed the above documentation for accuracy and completeness, and I agree with the above. - Coralie Common, MD

## 2022-02-19 ENCOUNTER — Other Ambulatory Visit: Payer: Self-pay | Admitting: Gastroenterology

## 2022-02-19 ENCOUNTER — Ambulatory Visit (HOSPITAL_COMMUNITY)
Admission: RE | Admit: 2022-02-19 | Discharge: 2022-02-19 | Disposition: A | Discharge status: Home / Self Care | Payer: Medicare Other | Source: Ambulatory Visit | Attending: Gastroenterology | Admitting: Gastroenterology

## 2022-02-19 DIAGNOSIS — R935 Abnormal findings on diagnostic imaging of other abdominal regions, including retroperitoneum: Secondary | ICD-10-CM | POA: Diagnosis not present

## 2022-02-19 DIAGNOSIS — K862 Cyst of pancreas: Secondary | ICD-10-CM | POA: Insufficient documentation

## 2022-02-19 MED ORDER — GADOBUTROL 1 MMOL/ML IV SOLN
10.0000 mL | Freq: Once | INTRAVENOUS | Status: AC | PRN
  Administered 2022-02-19: 10 mL via INTRAVENOUS

## 2022-03-02 DIAGNOSIS — L918 Other hypertrophic disorders of the skin: Secondary | ICD-10-CM | POA: Diagnosis not present

## 2022-03-02 DIAGNOSIS — L570 Actinic keratosis: Secondary | ICD-10-CM | POA: Diagnosis not present

## 2022-03-02 DIAGNOSIS — L817 Pigmented purpuric dermatosis: Secondary | ICD-10-CM | POA: Diagnosis not present

## 2022-03-02 DIAGNOSIS — Z85828 Personal history of other malignant neoplasm of skin: Secondary | ICD-10-CM | POA: Diagnosis not present

## 2022-03-02 DIAGNOSIS — L821 Other seborrheic keratosis: Secondary | ICD-10-CM | POA: Diagnosis not present

## 2022-03-03 ENCOUNTER — Other Ambulatory Visit (INDEPENDENT_AMBULATORY_CARE_PROVIDER_SITE_OTHER): Payer: Self-pay | Admitting: Physician Assistant

## 2022-03-03 ENCOUNTER — Encounter (INDEPENDENT_AMBULATORY_CARE_PROVIDER_SITE_OTHER): Payer: Self-pay | Admitting: Family Medicine

## 2022-03-03 ENCOUNTER — Ambulatory Visit (INDEPENDENT_AMBULATORY_CARE_PROVIDER_SITE_OTHER): Payer: Medicare Other | Admitting: Family Medicine

## 2022-03-03 VITALS — BP 124/62 | HR 49 | Temp 97.4°F | Ht 75.0 in | Wt 271.0 lb

## 2022-03-03 DIAGNOSIS — R7303 Prediabetes: Secondary | ICD-10-CM | POA: Diagnosis not present

## 2022-03-03 DIAGNOSIS — E7849 Other hyperlipidemia: Secondary | ICD-10-CM | POA: Insufficient documentation

## 2022-03-03 DIAGNOSIS — E559 Vitamin D deficiency, unspecified: Secondary | ICD-10-CM | POA: Diagnosis not present

## 2022-03-03 DIAGNOSIS — E669 Obesity, unspecified: Secondary | ICD-10-CM | POA: Diagnosis not present

## 2022-03-03 DIAGNOSIS — Z6834 Body mass index (BMI) 34.0-34.9, adult: Secondary | ICD-10-CM

## 2022-03-03 MED ORDER — VITAMIN D (ERGOCALCIFEROL) 1.25 MG (50000 UNIT) PO CAPS: 50000.0000 [IU] | ORAL_CAPSULE | ORAL | 0 refills | Status: DC

## 2022-03-03 MED ORDER — METFORMIN HCL 500 MG PO TABS: 500.0000 mg | ORAL_TABLET | Freq: Every day | ORAL | 0 refills | Status: DC

## 2022-03-03 NOTE — Telephone Encounter (Signed)
Please advise 

## 2022-03-09 ENCOUNTER — Other Ambulatory Visit: Payer: Medicare Other

## 2022-03-10 NOTE — Progress Notes (Signed)
Chief Complaint:   OBESITY Jonathan Dyer is here to discuss his progress with his obesity treatment plan along with follow-up of his obesity related diagnoses. Jonathan Dyer is on the Category 3 Plan and states he is following his eating plan approximately 85-90% of the time. Jonathan Dyer states he is bike riding and lifting weights for 60+ minutes 3-4 times per week.  Today's visit was #: 3 Starting weight: 274 lbs Starting date: 01/27/2022 Today's weight: 271 lbs Today's date: 03/03/2022 Total lbs lost to date: 3 Total lbs lost since last in-office visit: 0  Interim History: Jonathan Dyer reports some rare diversions, but overall following his plan.  Breakfast is 3 eggs, milk, bread, and yogurt.  Lunch is a frozen meal with an apple, yogurt, or salad.  Dinner is meat, hamburger, not always weighing his meat, and green beans (1 can).  He still notes some hunger, so he will eat peanut butter bread.  Subjective:   1. Prediabetes Jonathan Dyer's last A1c was 6.0.  We discussed the use of metformin but patient would like to try to diet control for now.  He would like metformin sent to the pharmacy and will consider this.  2. Other hyperlipidemia Jonathan Dyer's last LDL was 132 and triglyceride 172.  He is taking Crestor with no side effects noted, and he takes co-Q10 as well.  3. Vitamin D deficiency Jonathan Dyer is taking vitamin D 50,000 units once weekly with no side effects noted.  Assessment/Plan:   1. Prediabetes Jonathan Dyer will continue Category 3 and journaling, and exercise. He agreed to start metformin 500 mg once daily with no refills. We will recheck labs in 3-4 months.   - metFORMIN (GLUCOPHAGE) 500 MG tablet; Take 1 tablet (500 mg total) by mouth daily.  Dispense: 30 tablet; Refill: 0  2. Other hyperlipidemia Jonathan Dyer will continue Category 3 and journaling, and exercise. We will recheck labs in 3-4 months.   3. Vitamin D deficiency We will refill prescription Vitamin D 50,000 IU once weekly for 1 month. We will recheck labs in  3-4 months.   - Vitamin D, Ergocalciferol, (DRISDOL) 1.25 MG (50000 UNIT) CAPS capsule; Take 1 capsule (50,000 Units total) by mouth every 7 (seven) days.  Dispense: 4 capsule; Refill: 0  4. Obesity, Current BMI 34.0 Jonathan Dyer is currently in the action stage of change. As such, his goal is to continue with weight loss efforts. He has agreed to the Category 3 Plan or keeping a food journal and adhering to recommended goals of 1500-1600 calories and 100+ grams of protein daily.   Exercise goals: As is.   Behavioral modification strategies: increasing lean protein intake, decreasing simple carbohydrates, meal planning and cooking strategies, better snacking choices, and keeping a strict food journal.  Jonathan Dyer has agreed to follow-up with our clinic in 2 to 3 weeks. He was informed of the importance of frequent follow-up visits to maximize his success with intensive lifestyle modifications for his multiple health conditions.   Objective:   Blood pressure 124/62, pulse (!) 49, temperature (!) 97.4 F (36.3 C), height '6\' 3"'$  (1.905 m), weight 271 lb (122.9 kg), SpO2 95 %. Body mass index is 33.87 kg/m.  General: Cooperative, alert, well developed, in no acute distress. HEENT: Conjunctivae and lids unremarkable. Cardiovascular: Regular rhythm.  Lungs: Normal work of breathing. Neurologic: No focal deficits.   Lab Results  Component Value Date   CREATININE 1.23 01/27/2022   BUN 15 01/27/2022   NA 140 01/27/2022   K 4.8 01/27/2022  CL 100 01/27/2022   CO2 25 01/27/2022   Lab Results  Component Value Date   ALT 28 01/27/2022   AST 28 01/27/2022   ALKPHOS 106 01/27/2022   BILITOT 0.4 01/27/2022   Lab Results  Component Value Date   HGBA1C 6.0 (H) 01/27/2022   HGBA1C 5.5 10/03/2019   HGBA1C 5.7 10/21/2018   Lab Results  Component Value Date   INSULIN 12.6 01/27/2022   Lab Results  Component Value Date   TSH 1.890 01/27/2022   Lab Results  Component Value Date   CHOL 215 (H)  01/27/2022   HDL 52 01/27/2022   LDLCALC 132 (H) 01/27/2022   TRIG 172 (H) 01/27/2022   CHOLHDL 6 04/01/2021   Lab Results  Component Value Date   VD25OH 39.9 01/27/2022   VD25OH 30.49 04/01/2021   VD25OH 34 12/27/2019   Lab Results  Component Value Date   WBC 4.9 10/21/2018   HGB 14.8 10/21/2018   HCT 45 10/21/2018   MCV 91.5 06/28/2018   PLT 271 10/21/2018   No results found for: "IRON", "TIBC", "FERRITIN"  Attestation Statements:   Reviewed by clinician on day of visit: allergies, medications, problem list, medical history, surgical history, family history, social history, and previous encounter notes.   I, Trixie Dredge, am acting as transcriptionist for Dennard Nip, MD.  I have reviewed the above documentation for accuracy and completeness, and I agree with the above. -  Dennard Nip, MD

## 2022-03-22 NOTE — Progress Notes (Unsigned)
Cardiology Office Note  Date: 03/23/2022   ID: [name], Jonathan Dyer [birth year], MRN [id number]  PCP:  Wendie Agreste, MD  Cardiologist:  Rozann Lesches, MD Electrophysiologist:  None   Chief Complaint  Patient presents with   Cardiac follow-up    History of Present Illness: Jonathan Dyer is a 66 y.o. male last seen in May.  He is here for a routine visit.  States that he enjoyed his time out [place], did to bike races while he was out there with a friend.  Still riding locally but not quite as much.  He does not report any progressive palpitations, no angina, stable NYHA class I-II dyspnea.  States that he was told that he has prediabetes, was given a prescription for metformin but has not started it as yet.  He has cut back carbohydrates and is trying to lose weight.  I reviewed his most recent lab work which is noted below.  He continues on flecainide, no AV nodal blockers given resting bradycardia.  Today's blood pressure is well controlled.  CHA2DS2-VASc score is 3-4.  I talked with him again about stroke prophylaxis.  He remains concerned about taking an anticoagulant, particularly because he enjoys cycling and is worried about injury and bleeding risk.  We talked about referral for a Watchman device evaluation.  Past Medical History:  Diagnosis Date   Allergy    Arthritis    Atrial fibrillation ([place])    Diagnosed 09/2009, on flecainide   Back pain    Bilateral swelling of feet    Bradycardia    Event monitor 2010: HR down to 45, asymptomatic.   Cataract    Coronary atherosclerosis of native coronary artery    Nonobstructive and distal disease by cath 2005.    Essential hypertension    Hyperlipidemia    Joint pain    OSA (obstructive sleep apnea)    Osteoarthritis    Overweight    Prediabetes    Renal cyst    Ringing in ears    Sleep apnea    Vitamin D deficiency     Past Surgical History:  Procedure Laterality Date   BIOPSY  01/04/2018    Procedure: BIOPSY;  Surgeon: Jonathan Binder, MD;  Location: [facility];  Service: Endoscopy;;  ascending colon    CARDIAC CATHETERIZATION  2005   COLONOSCOPY  05/2006   SLF: 3 mm sigmoid: Tubular adenoma removed.   COLONOSCOPY N/A 05/06/2016   Dr. Oneida Dyer: two sessile polyps in mid transverse colon and hepatic flexure (tubular adenomas). ext/int hemorrhoids. tortuous left colon.    COLONOSCOPY WITH PROPOFOL N/A 01/04/2018   Procedure: COLONOSCOPY WITH PROPOFOL;  Surgeon: Jonathan Binder, MD;  Location: [facility];  Service: Endoscopy;  Laterality: N/A;  2:30pm   KNEE ARTHROSCOPY     Left anterior cruciate ligament reconstruction     left shoulder surgery     dr . Jonathan Dyer Jonathan Dyer    POLYPECTOMY  05/06/2016   Procedure: POLYPECTOMY;  Surgeon: Jonathan Binder, MD;  Location: [facility];  Service: Endoscopy;;  transverse colon polyp, hepatic flexure polyp,    Right shoulder surgery     ROBOT ASSISTED LAPAROSCOPIC NEPHRECTOMY Left 07/04/2018   Procedure: XI ROBOTIC ASSISTED LAPAROSCOPIC RENAL CYST DECORTICATION;  Surgeon: Jonathan Gustin, MD;  Location: [facility];  Service: Urology;  Laterality: Left;  3 HRSPROCEDURE: ROBOTIC RENAL CYST DECORTICATION   TONSILLECTOMY     TOTAL KNEE ARTHROPLASTY  Current Outpatient Medications  Medication Sig Dispense Refill   amLODipine (NORVASC) 10 MG tablet Take 1 tablet (10 mg total) by mouth daily. 90 tablet 3   apixaban (ELIQUIS) 5 MG TABS tablet Take 1 tablet (5 mg total) by mouth 2 (two) times daily. 60 tablet 6   chlorthalidone (HYGROTON) 25 MG tablet TAKE ONE-HALF TABLET BY MOUTH  DAILY 50 tablet 2   Coenzyme Q10 (COQ10) 100 MG CAPS Take 1 capsule by mouth daily.     flecainide (TAMBOCOR) 50 MG tablet Take 1 tablet (50 mg total) by mouth 2 (two) times daily. 20 tablet 0   furosemide (LASIX) 40 MG tablet Take 1 tablet (40 mg total) by mouth daily as needed for fluid. 90 tablet 0   losartan (COZAAR) 100 MG tablet Take 1 tablet (100 mg total) by  mouth daily. 90 tablet 3   Multiple Vitamin (MULTIVITAMIN) tablet Take 1 tablet by mouth daily.     omeprazole (PRILOSEC) 20 MG capsule Take 1 capsule (20 mg total) by mouth daily as needed (as needed for acid relux . indigestion). 90 capsule 0   potassium chloride (KLOR-CON) 10 MEQ tablet TAKE 1 TABLET BY MOUTH DAILY 100 tablet 2   rosuvastatin (CRESTOR) 5 MG tablet TAKE 1 TABLET BY MOUTH DAILY 100 tablet 2   tamsulosin (FLOMAX) 0.4 MG CAPS capsule Take 0.4 mg by mouth. Takes occasionally.     Vitamin D, Ergocalciferol, (DRISDOL) 1.25 MG (50000 UNIT) CAPS capsule Take 1 capsule (50,000 Units total) by mouth every 7 (seven) days. 4 capsule 0   No current facility-administered medications for this visit.   Allergies:  Clindamycin/lincomycin, Penicillins, and Pneumococcal 13-val conj vacc   ROS: No orthopnea or PND, no syncope.  Physical Exam: VS:  BP 118/80   Pulse (!) 49   Ht '6\' 3"'$  (1.905 m)   Wt 274 lb 3.2 oz (124.4 kg)   SpO2 96%   BMI 34.27 kg/m , BMI Body mass index is 34.27 kg/m.  Wt Readings from Last 3 Encounters:  03/23/22 274 lb 3.2 oz (124.4 kg)  03/03/22 271 lb (122.9 kg)  02/10/22 270 lb (122.5 kg)    General: Patient appears comfortable at rest. HEENT: Conjunctiva and lids normal. Neck: Supple, no elevated JVP or carotid bruits. Lungs: Clear to auscultation, nonlabored breathing at rest. Cardiac: Regular rate and rhythm, no S3 or significant systolic murmur, no pericardial rub. Abdomen: Soft, nontender, bowel sounds present. Extremities: No pitting edema.  ECG:  An ECG dated 09/08/2021 was personally reviewed today and demonstrated:  Sinus bradycardia with QRS duration 122 ms normal QTc.  Recent Labwork: 01/27/2022: ALT 28; AST 28; BUN 15; Creatinine, Ser 1.23; Potassium 4.8; Sodium 140; TSH 1.890     Component Value Date/Time   CHOL 215 (H) 01/27/2022 1020   TRIG 172 (H) 01/27/2022 1020   HDL 52 01/27/2022 1020   CHOLHDL 6 04/01/2021 0813   VLDL 24.4  04/01/2021 0813   LDLCALC 132 (H) 01/27/2022 1020   LDLCALC 131 (H) 10/03/2019 0926    Other Studies Reviewed Today:  Echocardiogram 11/18/2016: - Left ventricle: The cavity size was normal. Wall thickness was    increased in a pattern of mild LVH. Systolic function was normal.    The estimated ejection fraction was in the range of 55% to 60%.    Wall motion was normal; there were no regional wall motion    abnormalities. Left ventricular diastolic function parameters    were normal.  - Aortic valve: Mildly  calcified annulus. Trileaflet; mildly    thickened leaflets. Valve area (VTI): 3.42 cm^2. Valve area    (Vmax): 3.19 cm^2. Valve area (Vmean): 2.93 cm^2.  - Mitral valve: Mildly calcified annulus. Mildly thickened leaflets.  - Technically adequate study.   GXT 01/03/2019: Blood pressure demonstrated a hypertensive response to exercise. There was no ST segment deviation noted during stress. Duke treadmill score portends a low risk of cardiac events.   Cardiac monitor 09/26/2020: ZIO XT reviewed.  6 days 21 hours analyzed.  Predominant rhythm is sinus with heart rate ranging from 45 bpm up to 133 bpm and average heart rate 59 bpm.  There were rare PACs including couplets and triplets representing less than 1% total beats.  There were rare PVCs representing less than 1% total beats.  There was one brief episode of SVT lasting only 6 beats, otherwise no significant arrhythmias or pauses.  Assessment and Plan:  1.  Paroxysmal atrial fibrillation with CHA2DS2-VASc score of 3-4.  He continues on flecainide with good rhythm control based on symptoms.  No AV nodal blockers given resting bradycardia with heart rate in the 40s to 50s.  We did discuss stroke risk and he is concerned about regular anticoagulation given his enjoyment of cycling and concern about injury and bleeding risk.  We had discussed possibility of Eliquis, for now we will refer him for a Watchman device consultation.  This  would at least allow him a temporary use of anticoagulation if he were a candidate.  [place] Referral for Left Atrial Appendage Closure with Non-Valvular Atrial Fibrillation   Benn Lequan Dobratz is a 66 y.o. male is being referred to the [facility] Team for evaluation for Left Atrial Appendage Closure with Watchman device for the management of stroke risk resulting form non-valvular atrial fibrillation.    Base upon Mr. Fielder's history, he is felt to be a poor candidate for long-term anticoagulation because of  his concern about injury and bleeding risk he due to enjoyment of road and offroad cycling .  The patient has a HAS-BLED score of 2 indicating a Yearly Major Bleeding Risk of 1.88%.  His CHADS2-VASc Score is 4 with an unadjusted Ischemic Stroke Rate (% per year) of 4.8%.  His stroke risk necessitates a strategy of stroke prevention with either long-term oral anticoagulation or left atrial appendage occlusion therapy. We have discussed their bleeding risk in the context of their comorbid medical problems, as well as the rationale for referral for evaluation of Watchman left atrial appendage occlusion therapy. While the patient is at high long-term bleeding risk, they may be appropriate for short-term anticoagulation. Based on this individual patient's stroke and bleeding risk, a shared decision has been made to refer the patient for consideration of Watchman left atrial appendage closure utilizing the [employer] of Cardiology shared decision tool.   2.  Atherosclerosis, mild CAD by prior work-up with no angina.  He is tolerating Crestor, although was not on it consistently with his last lipid panel in September.  Would continue to work on getting LDL under 70.  3.  Essential hypertension, blood pressure well controlled today on Norvasc and Cozaar.  Keep follow-up with PCP.  Medication Adjustments/Labs and Tests Ordered: Current medicines are  reviewed at length with the patient today.  Concerns regarding medicines are outlined above.   Tests Ordered: Orders Placed This Encounter  Procedures   CBC   Basic metabolic panel    Medication Changes: Meds ordered this encounter  Medications   apixaban (ELIQUIS) 5 MG TABS tablet    Sig: Take 1 tablet (5 mg total) by mouth 2 (two) times daily.    Dispense:  60 tablet    Refill:  6    03/23/2022 NEW    Disposition:  Follow up  4 months.  Signed, Satira Sark, MD, [facility] 03/23/2022 4:46 PM    [facility] [place], [place], [place] [address] Phone: [phone]; Fax: [phone]

## 2022-03-23 ENCOUNTER — Encounter: Payer: Self-pay | Admitting: Cardiology

## 2022-03-23 ENCOUNTER — Other Ambulatory Visit: Payer: Self-pay | Admitting: *Deleted

## 2022-03-23 ENCOUNTER — Ambulatory Visit: Payer: Medicare Other | Attending: Cardiology | Admitting: Cardiology

## 2022-03-23 VITALS — BP 118/80 | HR 49 | Ht 75.0 in | Wt 274.2 lb

## 2022-03-23 DIAGNOSIS — I480 Paroxysmal atrial fibrillation: Secondary | ICD-10-CM

## 2022-03-23 DIAGNOSIS — Z79899 Other long term (current) drug therapy: Secondary | ICD-10-CM | POA: Diagnosis not present

## 2022-03-23 DIAGNOSIS — I10 Essential (primary) hypertension: Secondary | ICD-10-CM | POA: Diagnosis not present

## 2022-03-23 DIAGNOSIS — I2510 Atherosclerotic heart disease of native coronary artery without angina pectoris: Secondary | ICD-10-CM | POA: Diagnosis not present

## 2022-03-23 MED ORDER — APIXABAN 5 MG PO TABS: 5.0000 mg | ORAL_TABLET | Freq: Two times a day (BID) | ORAL | 6 refills | Status: DC

## 2022-03-23 NOTE — Patient Instructions (Addendum)
Medication Instructions:  Your physician has recommended you make the following change in your medication:  Start eliquis 5 mg twice daily Continue other medications the same  Labwork: BMET & CBC in 4 months just before your  next visit Non-fasting [facility] in [place]  Testing/Procedures: none  Follow-Up: Your physician recommends that you schedule a follow-up appointment in: 4 months  Any Other Special Instructions Will Be Listed Below (If Applicable).  If you need a refill on your cardiac medications before your next appointment, please call your pharmacy.

## 2022-03-24 ENCOUNTER — Telehealth: Payer: Self-pay

## 2022-03-24 NOTE — Telephone Encounter (Signed)
Per Dr. Domenic Polite, scheduled the patient for [facility] consult with Dr. Quentin Ore 03/25/2022. He was grateful for call and agreed with plan.

## 2022-03-25 ENCOUNTER — Encounter: Payer: Self-pay | Admitting: *Deleted

## 2022-03-25 ENCOUNTER — Ambulatory Visit: Payer: Medicare Other | Attending: Cardiology | Admitting: Cardiology

## 2022-03-25 ENCOUNTER — Encounter: Payer: Self-pay | Admitting: Cardiology

## 2022-03-25 VITALS — BP 124/76 | HR 55 | Ht 75.0 in | Wt 276.0 lb

## 2022-03-25 DIAGNOSIS — Z01818 Encounter for other preprocedural examination: Secondary | ICD-10-CM

## 2022-03-25 DIAGNOSIS — I480 Paroxysmal atrial fibrillation: Secondary | ICD-10-CM

## 2022-03-25 DIAGNOSIS — I10 Essential (primary) hypertension: Secondary | ICD-10-CM

## 2022-03-25 NOTE — Progress Notes (Signed)
Electrophysiology Office Note:    Date:  03/25/2022   ID:  Jonathan Dyer, DOB 07/31/1955, MRN [id number]  PCP:  Jonathan Agreste, MD  [facility] Cardiologist:  Jonathan Lesches, MD  [facility] Electrophysiologist:  Jonathan Epley, MD   Referring MD: Jonathan Agreste, MD   Chief Complaint: Watchman  History of Present Illness:    Jonathan Dyer is a 66 y.o. male who presents for a watchman consult at the request of Dr. Domenic Dyer. His medical history includes afib and bradycardia.   Today,  He hasn't had any problems with the flecainide. He will get a small flutter occasionally, but his heart rate/rhythm has been well controlled by flecainide for 13-14 years now.  He is trying to lose weight by bicycling.   He is not interested in additional medications, and is interested in the Vanoss procedure.   He sees his primary doctor every 6 months.  He denies any chest pain, shortness of breath, or peripheral edema. No lightheadedness, headaches, syncope, orthopnea, or PND.    Past Medical History:  Diagnosis Date   Allergy    Arthritis    Atrial fibrillation ([place])    Diagnosed 09/2009, on flecainide   Back pain    Bilateral swelling of feet    Bradycardia    Event monitor 2010: HR down to 45, asymptomatic.   Cataract    Coronary atherosclerosis of native coronary artery    Nonobstructive and distal disease by cath 2005.    Essential hypertension    Hyperlipidemia    Joint pain    OSA (obstructive sleep apnea)    Osteoarthritis    Overweight    Prediabetes    Renal cyst    Ringing in ears    Sleep apnea    Vitamin D deficiency     Past Surgical History:  Procedure Laterality Date   BIOPSY  01/04/2018   Procedure: BIOPSY;  Surgeon: Jonathan Binder, MD;  Location: [facility];  Service: Endoscopy;;  ascending colon    CARDIAC CATHETERIZATION  2005   COLONOSCOPY  05/2006   SLF: 3 mm sigmoid: Tubular adenoma removed.   COLONOSCOPY N/A  05/06/2016   Dr. Oneida Dyer: two sessile polyps in mid transverse colon and hepatic flexure (tubular adenomas). ext/int hemorrhoids. tortuous left colon.    COLONOSCOPY WITH PROPOFOL N/A 01/04/2018   Procedure: COLONOSCOPY WITH PROPOFOL;  Surgeon: Jonathan Binder, MD;  Location: [facility];  Service: Endoscopy;  Laterality: N/A;  2:30pm   KNEE ARTHROSCOPY     Left anterior cruciate ligament reconstruction     left shoulder surgery     dr . Jonathan Dyer    POLYPECTOMY  05/06/2016   Procedure: POLYPECTOMY;  Surgeon: Jonathan Binder, MD;  Location: [facility];  Service: Endoscopy;;  transverse colon polyp, hepatic flexure polyp,    Right shoulder surgery     ROBOT ASSISTED LAPAROSCOPIC NEPHRECTOMY Left 07/04/2018   Procedure: XI ROBOTIC ASSISTED LAPAROSCOPIC RENAL CYST DECORTICATION;  Surgeon: [name], MD;  Location: [facility];  Service: Urology;  Laterality: Left;  3 HRSPROCEDURE: ROBOTIC RENAL CYST DECORTICATION   TONSILLECTOMY     TOTAL KNEE ARTHROPLASTY      Current Medications: Current Meds  Medication Sig   amLODipine (NORVASC) 10 MG tablet Take 1 tablet (10 mg total) by mouth daily.   apixaban (ELIQUIS) 5 MG TABS tablet Take 1 tablet (5 mg total) by mouth 2 (two) times daily.   chlorthalidone (HYGROTON) 25  MG tablet TAKE ONE-HALF TABLET BY MOUTH  DAILY   Coenzyme Q10 (COQ10) 100 MG CAPS Take 1 capsule by mouth daily.   flecainide (TAMBOCOR) 50 MG tablet Take 1 tablet (50 mg total) by mouth 2 (two) times daily.   furosemide (LASIX) 40 MG tablet Take 1 tablet (40 mg total) by mouth daily as needed for fluid.   losartan (COZAAR) 100 MG tablet Take 1 tablet (100 mg total) by mouth daily.   Multiple Vitamin (MULTIVITAMIN) tablet Take 1 tablet by mouth daily.   omeprazole (PRILOSEC) 20 MG capsule Take 1 capsule (20 mg total) by mouth daily as needed (as needed for acid relux . indigestion).   potassium chloride (KLOR-CON) 10 MEQ tablet TAKE 1 TABLET BY MOUTH DAILY   rosuvastatin  (CRESTOR) 5 MG tablet TAKE 1 TABLET BY MOUTH DAILY   tamsulosin (FLOMAX) 0.4 MG CAPS capsule Take 0.4 mg by mouth. Takes occasionally.   Vitamin D, Ergocalciferol, (DRISDOL) 1.25 MG (50000 UNIT) CAPS capsule Take 1 capsule (50,000 Units total) by mouth every 7 (seven) days.     Allergies:   Clindamycin/lincomycin, Penicillins, and Pneumococcal 13-val conj vacc   Social History   Socioeconomic History   Marital status: Divorced    Spouse name: Not on file   Number of children: 3   Years of education: Not on file   Highest education level: Not on file  Occupational History   Occupation: PT-custodial work   Occupation: Pharmacologist part time driving a Printmaker  Tobacco Use   Smoking status: Never   Smokeless tobacco: Never  Vaping Use   Vaping Use: Never used  Substance and Sexual Activity   Alcohol use: No    Alcohol/week: 0.0 standard drinks of alcohol   Drug use: No   Sexual activity: Not Currently  Other Topics Concern   Not on file  Social History Narrative   Divorced since 1986,married for 10 years.Lives alone.Part time work,driving Jonathan Dyer.College education [employer].   Social Determinants of Health   Financial Resource Strain: Not on file  Food Insecurity: Not on file  Transportation Needs: Not on file  Physical Activity: Not on file  Stress: Not on file  Social Connections: Not on file     Family History: The patient's family history includes Arthritis in his sister; Cancer in his father; Diabetes in his mother and another family member; Heart disease in his father; Hyperlipidemia in his brother; Hypertension in his brother and father; Lung disease in his father; Obesity in his mother; Pulmonary embolism in his mother. There is no history of Colon cancer.  ROS:   Please see the history of present illness.     (+) Mild heart flutter  All other systems reviewed and are negative.  EKGs/Labs/Other Studies Reviewed:    The following studies were reviewed  today:    EKG:   EKG is personally reviewed.  03/25/22: SR, PR interval 192, QRS duration 120   Recent Labs: 01/27/2022: ALT 28; BUN 15; Creatinine, Ser 1.23; Potassium 4.8; Sodium 140; TSH 1.890   Recent Lipid Panel    Component Value Date/Time   CHOL 215 (H) 01/27/2022 1020   TRIG 172 (H) 01/27/2022 1020   HDL 52 01/27/2022 1020   CHOLHDL 6 04/01/2021 0813   VLDL 24.4 04/01/2021 0813   LDLCALC 132 (H) 01/27/2022 1020   LDLCALC 131 (H) 10/03/2019 0926    Physical Exam:    VS:  BP 124/76   Pulse (!) 55   Ht '6\' 3"'$  (1.905 m)  Wt 276 lb (125.2 kg)   SpO2 96%   BMI 34.50 kg/m     Wt Readings from Last 3 Encounters:  03/25/22 276 lb (125.2 kg)  03/23/22 274 lb 3.2 oz (124.4 kg)  03/03/22 271 lb (122.9 kg)     GEN: Well nourished, well developed in no acute distress HEENT: Normal NECK: No JVD; No carotid bruits LYMPHATICS: No lymphadenopathy CARDIAC: RRR, no murmurs, rubs, gallops RESPIRATORY:  Clear to auscultation without rales, wheezing or rhonchi  ABDOMEN: Soft, non-tender, non-distended MUSCULOSKELETAL:  No edema; No deformity  SKIN: Warm and dry NEUROLOGIC:  Alert and oriented x 3 PSYCHIATRIC:  Normal affect       ASSESSMENT:    1. Paroxysmal atrial fibrillation (HCC)   2. Pre-op evaluation   3. Primary hypertension    PLAN:    In order of problems listed above:  #Persistent AF Maintaining sinus rhythm on Flecainide. Currently not on a nodal blocker--risks were discussed in clinic today. Discussed ablation briefly today--he would like to continue on flecainide given his good symptom control for many years. I think this is very reasonable.  He would like to avoid exposure to Eliquis given his high risk hobbies (cycling) and concern about fall/injury/bleed risk.   ---------------  I have seen Aquiles Dellis Anes in the office today who is being considered for a Watchman left atrial appendage closure device. I believe they will benefit from this  procedure given their history of atrial fibrillation, CHA2DS2-VASc score of 4 and unadjusted ischemic stroke rate of 4.2% per year. Unfortunately, the patient is not felt to be a long term anticoagulation candidate secondary to high risk hobbies and his elevated risk of severe bleeding. The patient's chart has been reviewed and I feel that they would be a candidate for short term oral anticoagulation after Watchman implant.   It is my belief that after undergoing a LAA closure procedure, Suhaib Dasani Crear will not need long term anticoagulation which eliminates anticoagulation side effects and major bleeding risk.   Procedural risks for the Watchman implant have been reviewed with the patient including a 0.5% risk of stroke, <1% risk of perforation and <1% risk of device embolization. Other risks include bleeding, vascular damage, tamponade, worsening renal function, and death. The patient understands these risk and wishes to proceed.     The published clinical data on the safety and effectiveness of WATCHMAN include but are not limited to the following: - Holmes DR, Mechele Claude, Sick P et al. for the PROTECT AF Investigators. Percutaneous closure of the left atrial appendage versus warfarin therapy for prevention of stroke in patients with atrial fibrillation: a randomised non-inferiority trial. Lancet 2009; 374: 534-42. Mechele Claude, Doshi SK, Abelardo Diesel D et al. on behalf of the PROTECT AF Investigators. Percutaneous Left Atrial Appendage Closure for Stroke Prophylaxis in Patients With Atrial Fibrillation 2.3-Year Follow-up of the PROTECT AF (Watchman Left Atrial Appendage System for Embolic Protection in Patients With Atrial Fibrillation) Trial. Circulation 2013; 127:720-729. - Alli O, Doshi S,  Kar S, Reddy VY, Sievert H et al. Quality of Life Assessment in the Randomized PROTECT AF (Percutaneous Closure of the Left Atrial Appendage Versus Warfarin Therapy for Prevention of Stroke in Patients With  Atrial Fibrillation) Trial of Patients at Risk for Stroke With Nonvalvular Atrial Fibrillation. J Am Coll Cardiol 2013; 16:0737-1. Vertell Limber DR, Tarri Abernethy, Price Jerilynn Mages, Whisenant B, Sievert H, Doshi S, Huber K, Reddy V. Prospective randomized evaluation of the Watchman left atrial appendage  Device in patients with atrial fibrillation versus long-term warfarin therapy; the PREVAIL trial. Journal of the [employer] of Cardiology, Vol. 4, No. 1, 2014, 1-11. - Kar S, Doshi SK, Sadhu A, Horton R, Osorio J et al. Primary outcome evaluation of a next-generation left atrial appendage closure device: results from the PINNACLE FLX trial. Circulation 2021;143(18)1754-1762.    After today's visit with the patient which was dedicated solely for shared decision making visit regarding LAA closure device, the patient decided to proceed with the LAA appendage closure procedure scheduled to be done in the near future at [facility]. Prior to the procedure, I would like to obtain a gated CT scan of the chest with contrast timed for PV/LA visualization.     HAS-BLED score 2 Hypertension Yes  Abnormal renal and liver function (Dialysis, transplant, Cr >2.26 mg/dL /Cirrhosis or Bilirubin >2x Normal or AST/ALT/AP >3x Normal) No  Stroke No  Bleeding No  Labile INR (Unstable/high INR) No  Elderly (>65) Yes  Drugs or alcohol (? 8 drinks/week, anti-plt or NSAID) No   CHA2DS2-VASc Score = 4  The patient's score is based upon: CHF History: 0 HTN History: 1 Diabetes History: 1 Stroke History: 0 Vascular Disease History: 1 Age Score: 1 Gender Score: 0  Medication Adjustments/Labs and Tests Ordered: Current medicines are reviewed at length with the patient today.  Concerns regarding medicines are outlined above.   Orders Placed This Encounter  Procedures   CT CARDIAC MORPH/PULM VEIN W/CM&W/O CA SCORE   Basic Metabolic Panel (BMET)   EKG 12-Lead   ECHOCARDIOGRAM COMPLETE   No orders of the defined types  were placed in this encounter.   I,Mary Mosetta Pigeon Buren,acting as a scribe for Jonathan Epley, MD.,have documented all relevant documentation on the behalf of Jonathan Epley, MD,as directed by  Jonathan Epley, MD while in the presence of Jonathan Epley, MD.  I, Jonathan Epley, MD, have reviewed all documentation for this visit. The documentation on 03/25/22 for the exam, diagnosis, procedures, and orders are all accurate and complete.   Signed, Hilton Cork. Quentin Ore, MD, [employer], [facility] 03/25/2022 7:01 PM    Electrophysiology [facility]

## 2022-03-25 NOTE — Patient Instructions (Signed)
Medication Instructions:  None  *If you need a refill on your cardiac medications before your next appointment, please call your pharmacy*   Lab Work: BMP If you have labs (blood work) drawn today and your tests are completely normal, you will receive your results only by: [place] (if you have MyChart) OR A paper copy in the mail If you have any lab test that is abnormal or we need to change your treatment, we will call you to review the results.   Testing/Procedures: Your physician has requested that you have an echocardiogram. Echocardiography is a painless test that uses sound waves to create images of your heart. It provides your doctor with information about the size and shape of your heart and how well your heart's chambers and valves are working. This procedure takes approximately one hour. There are no restrictions for this procedure. Please do NOT wear cologne, perfume, aftershave, or lotions (deodorant is allowed). Please arrive 15 minutes prior to your appointment time.  Your physician has requested that you have cardiac CT. Cardiac computed tomography (CT) is a painless test that uses an x-ray machine to take clear, detailed pictures of your heart. For further information please visit [email or web address]. Please follow instruction sheet as given.     Follow-Up: At [facility], you and your health needs are our priority.  As part of our continuing mission to provide you with exceptional heart care, we have created designated Provider Care Teams.  These Care Teams include your primary Cardiologist (physician) and Advanced Practice Providers (APPs -  Physician Assistants and Nurse Practitioners) who all work together to provide you with the care you need, when you need it.  We recommend signing up for the patient portal called "MyChart".  Sign up information is provided on this After Visit Summary.  MyChart is used to connect with patients for Virtual Visits  (Telemedicine).  Patients are able to view lab/test results, encounter notes, upcoming appointments, etc.  Non-urgent messages can be sent to your provider as well.   To learn more about what you can do with MyChart, go to [email or web address].    Your next appointment:   Lenice Llamas, the Watchman Nurse Navigator, will call you after your CT once the [facility] Team has reviewed your imaging for an update on proceedings. Katy's direct number is [phone] if you need assistance.   Important Information About Sugar

## 2022-03-26 LAB — BASIC METABOLIC PANEL
BUN/Creatinine Ratio: 21 (ref 10–24)
BUN: 31 mg/dL — ABNORMAL HIGH (ref 8–27)
CO2: 25 mmol/L (ref 20–29)
Calcium: 9.2 mg/dL (ref 8.6–10.2)
Chloride: 104 mmol/L (ref 96–106)
Creatinine, Ser: 1.46 mg/dL — ABNORMAL HIGH (ref 0.76–1.27)
Glucose: 149 mg/dL — ABNORMAL HIGH (ref 70–99)
Potassium: 3.9 mmol/L (ref 3.5–5.2)
Sodium: 142 mmol/L (ref 134–144)
eGFR: 52 mL/min/{1.73_m2} — ABNORMAL LOW (ref >59)

## 2022-03-29 ENCOUNTER — Other Ambulatory Visit: Payer: Self-pay | Admitting: Cardiology

## 2022-03-30 ENCOUNTER — Ambulatory Visit (INDEPENDENT_AMBULATORY_CARE_PROVIDER_SITE_OTHER): Payer: Medicare Other | Admitting: Family Medicine

## 2022-03-30 ENCOUNTER — Encounter (INDEPENDENT_AMBULATORY_CARE_PROVIDER_SITE_OTHER): Payer: Self-pay | Admitting: Family Medicine

## 2022-03-31 ENCOUNTER — Ambulatory Visit
Admission: RE | Admit: 2022-03-31 | Discharge: 2022-03-31 | Disposition: A | Discharge status: Home / Self Care | Payer: Medicare Other | Source: Ambulatory Visit | Attending: Urology | Admitting: Urology

## 2022-03-31 DIAGNOSIS — R972 Elevated prostate specific antigen [PSA]: Secondary | ICD-10-CM

## 2022-03-31 MED ORDER — GADOPICLENOL 0.5 MMOL/ML IV SOLN
10.0000 mL | Freq: Once | INTRAVENOUS | Status: AC | PRN
  Administered 2022-03-31: 10 mL via INTRAVENOUS

## 2022-04-02 ENCOUNTER — Other Ambulatory Visit: Payer: Self-pay | Admitting: Cardiology

## 2022-04-05 ENCOUNTER — Other Ambulatory Visit: Payer: Self-pay | Admitting: Cardiology

## 2022-04-06 ENCOUNTER — Encounter: Payer: Self-pay | Admitting: Family Medicine

## 2022-04-06 ENCOUNTER — Ambulatory Visit (INDEPENDENT_AMBULATORY_CARE_PROVIDER_SITE_OTHER): Payer: Medicare Other | Admitting: Family Medicine

## 2022-04-06 VITALS — BP 132/70 | HR 51 | Temp 98.1°F | Ht 75.0 in | Wt 278.2 lb

## 2022-04-06 DIAGNOSIS — R972 Elevated prostate specific antigen [PSA]: Secondary | ICD-10-CM

## 2022-04-06 DIAGNOSIS — I10 Essential (primary) hypertension: Secondary | ICD-10-CM

## 2022-04-06 DIAGNOSIS — E669 Obesity, unspecified: Secondary | ICD-10-CM

## 2022-04-06 DIAGNOSIS — R7303 Prediabetes: Secondary | ICD-10-CM | POA: Diagnosis not present

## 2022-04-06 DIAGNOSIS — Z6834 Body mass index (BMI) 34.0-34.9, adult: Secondary | ICD-10-CM

## 2022-04-06 DIAGNOSIS — E7849 Other hyperlipidemia: Secondary | ICD-10-CM

## 2022-04-06 NOTE — Progress Notes (Signed)
Subjective:  [name], male    DOB: 1956/03/29  Age: 66 y.o. MRN: [id number]  CC:  Chief Complaint  Patient presents with   Hyperlipidemia   Hypertension    Pt states all is well    HPI Jonathan Dyer presents for   Hypertension: With history of atrial fibrillation, bradycardia.  Treated with flecainide.  Recent visit with electrophysiology, Dr. Quentin Ore on November 15.  Treated with flecainide, maintaining sinus rhythm, not on nodal blocker, risks were discussed.  Ablation was briefly discussed but continued on flecainide given good symptom control for many years.  Also decided to avoid anticoagulation given his high risk hobbies and concern about fall/injury/bleeding risk.  He was interested in the Watchman procedure - will be pursuing. Has Rx for Eliquis, not started.  Held on starting meds as planned prostate biopsy soon as well.  Cardiologist Dr. Domenic Polite. Home readings: none recent.  Chlorthalidone - 3 days per week. Only taking with food - timing of meds.  Potassium 58mq with chlorthalidone.  Amlodipine and losartan daily.  Rare furosemide - every few months for swelling.  BP Readings from Last 3 Encounters:  04/06/22 132/70  03/25/22 124/76  03/23/22 118/80   Lab Results  Component Value Date   CREATININE 1.46 (H) 03/25/2022   Prediabetes: With obesity.  Discussed with weight management, Dr. BLeafy Ro  Prescription for metformin had been given.  Plan to diet/exercise approach.  Gravel bike racing. Still riding[place]   Lab Results  Component Value Date   HGBA1C 6.0 (H) 01/27/2022   Wt Readings from Last 3 Encounters:  04/06/22 278 lb 3.2 oz (126.2 kg)  03/25/22 276 lb (125.2 kg)  03/23/22 274 lb 3.2 oz (124.4 kg)   Hyperlipidemia: On crestor '5mg'$  qd.  Lab Results  Component Value Date   CHOL 215 (H) 01/27/2022   HDL 52 01/27/2022   LDLCALC 132 (H) 01/27/2022   TRIG 172 (H) 01/27/2022    CHOLHDL 6 04/01/2021   Lab Results  Component Value Date   ALT 28 01/27/2022   AST 28 01/27/2022   ALKPHOS 106 01/27/2022   BILITOT 0.4 01/27/2022   Elevated PSA Under care of urology -  Dr. HLouis Meckel Plan on appt next week. Plan for PSA, MRI, then possible bx.   HM: Declines covid booster.  Declines shingrix, flu vaccine.  Pcv 23 recommended - he defers today.   Immunization History  Administered Date(s) Administered   Fluad Quad(high Dose 65+) 03/27/2020, 03/31/2021   Influenza Inj Mdck Quad With Preservative 02/25/2016   Influenza,inj,Quad PF,6+ Mos 02/14/2015   Influenza-Unspecified 05/09/2012, 01/24/2013, 02/25/2017, 03/02/2017, 05/24/2018, 03/03/2019   Moderna Sars-Covid-2 Vaccination 07/20/2019, 08/23/2019   PPD Test 02/05/2014   Pneumococcal Conjugate-13 08/07/2020   Pneumococcal Polysaccharide-23 02/25/2016   Tdap 01/09/2015      History Patient Active Problem List   Diagnosis Date Noted   Prediabetes 03/03/2022   Other hyperlipidemia 03/03/2022   Vitamin D deficiency 03/03/2022   Class 1 obesity with serious comorbidity and body mass index (BMI) of 34.0 to 34.9 in adult 03/03/2022   Arthritis of carpometacarpal ([facility] joint of left thumb 02/05/2020   Gastroesophageal reflux disease without esophagitis 04/17/2019   Localized, primary osteoarthritis of hand 09/06/2018   Trigger finger of right hand 09/06/2018   Osteoarthritis of carpometacarpal ([facility] joint of thumb 09/06/2018   Renal cyst 07/04/2018   Colonic mass 01/03/2018   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   Screen for colon  cancer 12/29/2017   Arthralgia of right foot 08/17/2017   Degeneration of lumbar intervertebral disc 08/04/2017   Osteoarthritis of right knee 07/20/2017   Spinal stenosis 07/20/2017   Hypertensive disorder 01/01/2016   Hx of adenomatous colonic polyps 03/01/2015   Pain in joint, shoulder region 01/26/2012   Impingement syndrome of left shoulder 01/26/2012   Muscle weakness (generalized)  01/26/2012   Hyperlipidemia, mixed 10/11/2009   Obstructive sleep apnea 10/11/2009   PAROXYSMAL ATRIAL FIBRILLATION 10/11/2009   CAD (coronary artery disease), native coronary artery 08/21/2009   Benign essential hypertension 11/09/2008   Past Medical History:  Diagnosis Date   Allergy    Arthritis    Atrial fibrillation ([place])    Diagnosed 09/2009, on flecainide   Back pain    Bilateral swelling of feet    Bradycardia    Event monitor 2010: HR down to 45, asymptomatic.   Cataract    Coronary atherosclerosis of native coronary artery    Nonobstructive and distal disease by cath 2005.    Essential hypertension    Hyperlipidemia    Joint pain    OSA (obstructive sleep apnea)    Osteoarthritis    Overweight    Prediabetes    Renal cyst    Ringing in ears    Sleep apnea    Vitamin D deficiency    Past Surgical History:  Procedure Laterality Date   BIOPSY  01/04/2018   Procedure: BIOPSY;  Surgeon: [name], MD;  [facility]: [facility];  Service: Endoscopy;;  ascending colon    CARDIAC CATHETERIZATION  2005   COLONOSCOPY  05/2006   SLF: 3 mm sigmoid: Tubular adenoma removed.   COLONOSCOPY N/A 05/06/2016   Dr. [name]: two sessile polyps in mid transverse colon and hepatic flexure (tubular adenomas). ext/int hemorrhoids. tortuous left colon.    COLONOSCOPY WITH PROPOFOL N/A 01/04/2018   Procedure: COLONOSCOPY WITH PROPOFOL;  Surgeon: [name], MD;  Location: [facility];  Service: Endoscopy;  Laterality: N/A;  2:30pm   KNEE ARTHROSCOPY     Left anterior cruciate ligament reconstruction     left shoulder surgery     dr . Herbie [place] collins    POLYPECTOMY  05/06/2016   Procedure: POLYPECTOMY;  Surgeon: [name], MD;  Location: [facility];  Service: Endoscopy;;  transverse colon polyp, hepatic flexure polyp,    Right shoulder surgery     ROBOT ASSISTED LAPAROSCOPIC NEPHRECTOMY Left 07/04/2018   Procedure: XI ROBOTIC ASSISTED LAPAROSCOPIC RENAL CYST  DECORTICATION;  Surgeon: [name], MD;  Location: [facility];  Service: Urology;  Laterality: Left;  3 HRSPROCEDURE: ROBOTIC RENAL CYST DECORTICATION   TONSILLECTOMY     TOTAL KNEE ARTHROPLASTY     Allergies  Allergen Reactions   Clindamycin/Lincomycin Swelling and Other (See Comments)    Mycin drug family    Penicillin G Other (See Comments)   Penicillins Other (See Comments)    Has patient had a PCN reaction causing immediate rash, facial/tongue/throat swelling, SOB or lightheadedness with hypotension: unknown Has patient had a PCN reaction causing severe rash involving mucus membranes or skin necrosis: unknown Has patient had a PCN reaction that required hospitalization unknown Has patient had a PCN reaction occurring within the last 10 years: unknown If all of the above answers are "NO", then may proceed    Pneumococcal 13-Val Conj Vacc Swelling    Patient stated that he has severe swelling in his arm from vaccine and swollen up to his collar bone.  Lasted about 2 days and did not feel well.    Prior to Admission medications   Medication Sig Start Date End Date Taking? Authorizing Provider  amLODipine (NORVASC) 10 MG tablet TAKE 1 TABLET BY MOUTH DAILY 03/30/22  Yes Satira Sark, MD  chlorthalidone (HYGROTON) 25 MG tablet TAKE ONE-HALF TABLET BY MOUTH  DAILY 11/13/21  Yes Satira Sark, MD  Coenzyme Q10 (COQ10) 100 MG CAPS Take 1 capsule by mouth daily.   Yes [provider]  flecainide (TAMBOCOR) 50 MG tablet TAKE 1 TABLET BY MOUTH TWICE  DAILY 03/30/22  Yes Satira Sark, MD  furosemide (LASIX) 40 MG tablet TAKE 1 TABLET BY MOUTH DAILY AS  NEEDED FOR FLUID 04/06/22  Yes Satira Sark, MD  losartan (COZAAR) 100 MG tablet TAKE 1 TABLET BY MOUTH DAILY 03/30/22  Yes Satira Sark, MD  Multiple Vitamin (MULTIVITAMIN) tablet Take 1 tablet by mouth daily.   Yes [provider]  omeprazole (PRILOSEC) 20 MG capsule TAKE 1 CAPSULE BY MOUTH DAILY  AS NEEDED FOR ACID REFLUX,  INDIGESTION 04/06/22  Yes Satira Sark, MD  potassium chloride (KLOR-CON) 10 MEQ tablet TAKE 1 TABLET BY MOUTH DAILY 11/13/21  Yes Satira Sark, MD  rosuvastatin (CRESTOR) 5 MG tablet TAKE 1 TABLET BY MOUTH DAILY 01/05/22  Yes Satira Sark, MD  tamsulosin (FLOMAX) 0.4 MG CAPS capsule Take 0.4 mg by mouth. Takes occasionally.   Yes [provider]  apixaban (ELIQUIS) 5 MG TABS tablet Take 1 tablet (5 mg total) by mouth 2 (two) times daily. Patient not taking: Reported on 04/06/2022 03/23/22   Satira Sark, MD  Vitamin D, Ergocalciferol, (DRISDOL) 1.25 MG (50000 UNIT) CAPS capsule Take 1 capsule (50,000 Units total) by mouth every 7 (seven) days. Patient not taking: Reported on 04/06/2022 03/03/22   Rayburn, Neta Mends, PA-C   Social History   Socioeconomic History   Marital status: Divorced    Spouse name: Not on file   Number of children: 3   Years of education: Not on file   Highest education level: Not on file  Occupational History   Occupation: PT-custodial work   Occupation: Retired-work part time driving a Printmaker  Tobacco Use   Smoking status: Never   Smokeless tobacco: Never  Vaping Use   Vaping Use: Never used  Substance and Sexual Activity   Alcohol use: No    Alcohol/week: 0.0 standard drinks of alcohol   Drug use: No   Sexual activity: Not Currently  Other Topics Concern   Not on file  Social History Narrative   Divorced since 1986,married for 10 years.Lives alone.Part time work,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   Social Determinants of Health   Financial Resource Strain: Not on file  Food Insecurity: Not on file  Transportation Needs: Not on file  Physical Activity: Not on file  Stress: Not on file  Social Connections: Not on file  Intimate Partner Violence: Not on file    Review of Systems  Constitutional:  Negative for fatigue and unexpected weight change.  Eyes:  Negative for visual  disturbance.  Respiratory:  Negative for cough, chest tightness and shortness of breath.   Cardiovascular:  Positive for palpitations (occasional fleeting.). Negative for chest pain and leg swelling.  Gastrointestinal:  Negative for abdominal pain and blood in stool.  Neurological:  Negative for dizziness, light-headedness and headaches.     Objective:   Vitals:   04/06/22 1105  BP: 132/70  Pulse: (!) 51  Temp: 98.1 F (36.7 C)  SpO2: 96%  Weight: 278 lb 3.2 oz (126.2 kg)  Height: '6\' 3"'$  (1.905 m)     Physical Exam Vitals reviewed.  Constitutional:      Appearance: He is well-developed.  HENT:     Head: Normocephalic and atraumatic.  Neck:     Vascular: No carotid bruit or JVD.  Cardiovascular:     Rate and Rhythm: Normal rate and regular rhythm.     Heart sounds: Normal heart sounds. No murmur heard. Pulmonary:     Effort: Pulmonary effort is normal.     Breath sounds: Normal breath sounds. No rales.  Musculoskeletal:     Right lower leg: No edema.     Left lower leg: No edema.  Skin:    General: Skin is warm and dry.  Neurological:     Mental Status: He is alert and oriented to person, place, and time.  Psychiatric:        Mood and Affect: Mood normal.        Assessment & Plan:  Jonathan Dyer is a 66 y.o. male . Prediabetes Obesity, Current BMI 34.0  -Continue follow-up with weight management.  He has expressed some concern about minimal weight loss, but does feel overall better with his change in diet.  Discussed that he is not gaining weight and with some weight loss that is a success but to discuss other approaches, and goals with weight management at his upcoming appointment.  Can also discuss metformin and timing further at that visit.  No new Labs today.  Other hyperlipidemia  -Recent labs noted, on meds, followed by cardiology, no changes.  Essential hypertension  -Stable on current regimen, recent labs noted.  No changes for  now.  -Tolerating flecainide, planned procedure as above, deferred anticoagulation at this time.  Elevated PSA  -Plan follow-up with urology as above.  No orders of the defined types were placed in this encounter.  Patient Instructions  No med changes today. Discuss goals and concerns regarding weight with Dr. Leafy Ro. Keep follow up with other specialists as planned. Take care!    Signed,   Merri Ray, MD [place], [facility] 04/06/22 11:59 AM

## 2022-04-06 NOTE — Patient Instructions (Signed)
No med changes today. Discuss goals and concerns regarding weight with Dr. Leafy Ro. Keep follow up with other specialists as planned. Take care!

## 2022-04-09 ENCOUNTER — Other Ambulatory Visit (INDEPENDENT_AMBULATORY_CARE_PROVIDER_SITE_OTHER): Payer: Self-pay | Admitting: Physician Assistant

## 2022-04-09 DIAGNOSIS — R7303 Prediabetes: Secondary | ICD-10-CM

## 2022-04-13 ENCOUNTER — Ambulatory Visit (INDEPENDENT_AMBULATORY_CARE_PROVIDER_SITE_OTHER): Payer: Medicare Other | Admitting: Family Medicine

## 2022-04-13 ENCOUNTER — Encounter (INDEPENDENT_AMBULATORY_CARE_PROVIDER_SITE_OTHER): Payer: Self-pay | Admitting: Family Medicine

## 2022-04-13 VITALS — BP 128/69 | HR 57 | Temp 97.3°F | Ht 75.0 in | Wt 270.0 lb

## 2022-04-13 DIAGNOSIS — R7303 Prediabetes: Secondary | ICD-10-CM | POA: Diagnosis not present

## 2022-04-13 DIAGNOSIS — E669 Obesity, unspecified: Secondary | ICD-10-CM | POA: Diagnosis not present

## 2022-04-13 DIAGNOSIS — I480 Paroxysmal atrial fibrillation: Secondary | ICD-10-CM

## 2022-04-13 DIAGNOSIS — Z6833 Body mass index (BMI) 33.0-33.9, adult: Secondary | ICD-10-CM

## 2022-04-21 ENCOUNTER — Ambulatory Visit: Payer: Medicare Other | Attending: Cardiology

## 2022-04-21 ENCOUNTER — Telehealth: Payer: Self-pay | Admitting: Cardiology

## 2022-04-21 DIAGNOSIS — Z01818 Encounter for other preprocedural examination: Secondary | ICD-10-CM

## 2022-04-21 DIAGNOSIS — I480 Paroxysmal atrial fibrillation: Secondary | ICD-10-CM

## 2022-04-21 LAB — ECHOCARDIOGRAM COMPLETE
AR max vel: 2.3 cm2
AV Peak grad: 7.6 mmHg
Ao pk vel: 1.38 m/s
Area-P 1/2: 2.42 cm2
Calc EF: 59.8 %
MV M vel: 2.91 m/s
MV Peak grad: 33.8 mmHg
S' Lateral: 3.5 cm
Single Plane A2C EF: 58.4 %
Single Plane A4C EF: 60.5 %

## 2022-04-21 NOTE — Telephone Encounter (Signed)
Pt wanted Dr. Domenic Polite to know that he has not started the Eliquis yet because he's needing to have a biopsy done. They're suspecting that he has prostate cancer. He's not been scheduled for the procedure yet.

## 2022-04-22 NOTE — Progress Notes (Unsigned)
Chief Complaint:   OBESITY Jonathan Dyer is here to discuss his progress with his obesity treatment plan along with follow-up of his obesity related diagnoses. [name] is on the Category 3 Plan and keeping a food journal and adhering to recommended goals of 1500-1600 calories and 100+ grams of protein and states he is following his eating plan approximately 95% of the time. [name] states he is bike riding and lifting weights 3-4 times per week.  Today's visit was #: 4 Starting weight: 274 lbs Starting date: 01/27/2022 Today's weight: 270 lbs Today's date: 04/13/2022 Total lbs lost to date: 4 Total lbs lost since last in-office visit: 1  Interim History: [name] has been working on his weight loss. He journaled until Thanksgiving, but indulged that week. He is frustrated at his lack of more weight loss. He is open to looking at other eating plans.    Subjective:   1. Prediabetes Jonathan Dyer was prescribed metformin but he has not taken it as he wants to improve his A1c with diet and exercise.   2. Paroxysmal atrial fibrillation (HCC) Jonathan Dyer is followed by Cardiology, and he was placed on Eliquis but he has not started it yet due to his abnormal MRI with questionable prostate lesion, and likely need for a prostate biopsy.   Assessment/Plan:   1. Prediabetes Jonathan Dyer placed on a low carbohydrates but vegetable rich eating plan. We will follow-up in 4-5 weeks.   2. Paroxysmal atrial fibrillation (HCC) Jonathan Dyer will see Urology tomorrow and make a plan for additional testing. In the meanwhile, he will work on weight loss to decrease the frequency of Afib exacerbations.   3. Obesity, Current BMI 33.8 Jonathan Dyer is currently in the action stage of change. As such, his goal is to continue with weight loss efforts. He has agreed to change to following a lower carbohydrate, vegetable and lean protein rich diet plan.   Exercise goals: As is.   Behavioral modification strategies: increasing lean protein intake, meal planning  and cooking strategies, and holiday eating strategies .  Jonathan Dyer has agreed to follow-up with our clinic in 5 weeks. He was informed of the importance of frequent follow-up visits to maximize his success with intensive lifestyle modifications for his multiple health conditions.   Objective:   Blood pressure 128/69, pulse (!) 57, temperature (!) 97.3 F (36.3 C), height '6\' 3"'$  (1.905 m), weight 270 lb (122.5 kg), SpO2 94 %. Body mass index is 33.75 kg/m.  General: Cooperative, alert, well developed, in no acute distress. HEENT: Conjunctivae and lids unremarkable. Cardiovascular: Regular rhythm.  Lungs: Normal work of breathing. Neurologic: No focal deficits.   Lab Results  Component Value Date   CREATININE 1.46 (H) 03/25/2022   BUN 31 (H) 03/25/2022   NA 142 03/25/2022   K 3.9 03/25/2022   CL 104 03/25/2022   CO2 25 03/25/2022   Lab Results  Component Value Date   ALT 28 01/27/2022   AST 28 01/27/2022   ALKPHOS 106 01/27/2022   BILITOT 0.4 01/27/2022   Lab Results  Component Value Date   HGBA1C 6.0 (H) 01/27/2022   HGBA1C 5.5 10/03/2019   HGBA1C 5.7 10/21/2018   Lab Results  Component Value Date   INSULIN 12.6 01/27/2022   Lab Results  Component Value Date   TSH 1.890 01/27/2022   Lab Results  Component Value Date   CHOL 215 (H) 01/27/2022   HDL 52 01/27/2022   LDLCALC 132 (H) 01/27/2022   TRIG 172 (H) 01/27/2022  CHOLHDL 6 04/01/2021   Lab Results  Component Value Date   VD25OH 39.9 01/27/2022   VD25OH 30.49 04/01/2021   VD25OH 34 12/27/2019   Lab Results  Component Value Date   WBC 4.9 10/21/2018   HGB 14.8 10/21/2018   HCT 45 10/21/2018   MCV 91.5 06/28/2018   PLT 271 10/21/2018   No results found for: "IRON", "TIBC", "FERRITIN"  Attestation Statements:   Reviewed by clinician on day of visit: allergies, medications, problem list, medical history, surgical history, family history, social history, and previous encounter notes.  Time spent on  visit including pre-visit chart review and post-visit care and charting was 45 minutes.   I, Trixie Dredge, am acting as transcriptionist for Dennard Nip, MD.  I have reviewed the above documentation for accuracy and completeness, and I agree with the above. -  Dennard Nip, MD

## 2022-04-30 ENCOUNTER — Telehealth: Payer: Self-pay | Admitting: Cardiology

## 2022-04-30 NOTE — Telephone Encounter (Signed)
Pt would like to know results from Echo

## 2022-05-05 ENCOUNTER — Telehealth: Payer: Self-pay | Admitting: Cardiology

## 2022-05-05 NOTE — Telephone Encounter (Signed)
Reviewed results of Echo with patient and he verbalized understanding. Patient wanted to make Dr Quentin Ore aware he has not been contacted to schedule his cardiac CT and he is having a prostate procedure on 05/19/22.  He has not started Eliquis due to the scheduled procedure and states he did make Dr Domenic Polite aware.

## 2022-05-05 NOTE — Telephone Encounter (Signed)
LMTCB

## 2022-05-05 NOTE — Telephone Encounter (Signed)
Patient is returning RN's call for echo results. Please advise.

## 2022-05-05 NOTE — Telephone Encounter (Signed)
Returning call from patient regarding Echo results, left message.

## 2022-05-11 DIAGNOSIS — C61 Malignant neoplasm of prostate: Secondary | ICD-10-CM

## 2022-05-11 HISTORY — DX: Malignant neoplasm of prostate: C61

## 2022-05-12 ENCOUNTER — Encounter (INDEPENDENT_AMBULATORY_CARE_PROVIDER_SITE_OTHER): Payer: Self-pay | Admitting: Family Medicine

## 2022-05-12 ENCOUNTER — Encounter (HOSPITAL_COMMUNITY): Payer: Self-pay

## 2022-05-12 ENCOUNTER — Ambulatory Visit (INDEPENDENT_AMBULATORY_CARE_PROVIDER_SITE_OTHER): Payer: Medicare Other | Admitting: Family Medicine

## 2022-05-12 VITALS — BP 125/67 | HR 58 | Temp 97.8°F | Ht 75.0 in | Wt 271.0 lb

## 2022-05-12 DIAGNOSIS — Z6834 Body mass index (BMI) 34.0-34.9, adult: Secondary | ICD-10-CM | POA: Diagnosis not present

## 2022-05-12 DIAGNOSIS — E669 Obesity, unspecified: Secondary | ICD-10-CM | POA: Diagnosis not present

## 2022-05-12 DIAGNOSIS — I10 Essential (primary) hypertension: Secondary | ICD-10-CM | POA: Diagnosis not present

## 2022-05-12 NOTE — Telephone Encounter (Signed)
The patient and several other numbers on file (his sons') have been called multiple times since November to schedule CT. Mr. Giammarino son was able to be reached today. He arranged CT 07/24/22 at 0830 as the patient has a prostate cancer screening/work up soon. He will call the CT scheduler to arrange earlier if everything goes well with the cancer screening.

## 2022-05-25 NOTE — Progress Notes (Unsigned)
Chief Complaint:   OBESITY Jonathan Dyer is here to discuss his progress with his obesity treatment plan along with follow-up of his obesity related diagnoses. Jonathan Dyer is on following a lower carbohydrate, vegetable and lean protein rich diet plan and states he is following his eating plan approximately 25% of the time. Jonathan Dyer states he is doing 0 minutes 0 times per week.  Today's visit was #: 5 Starting weight: 274 lbs Starting date: 01/27/2022 Today's weight: 271 lbs Today's date: 05/12/2022 Total lbs lost to date: 3 Total lbs lost since last in-office visit: 0  Interim History: Jonathan Dyer did well with minimizing holiday weight gain.  He is working on getting back to his low carbohydrate plan, but he would like to add fruit.  Subjective:   1. Essential hypertension, benign Jonathan Dyer's blood pressure is well-controlled on losartan.  He stopped his chlorthalidone and KCI approximately 2 months ago, and he takes his Lasix sporadically (maybe only 1 time per month on average).   Assessment/Plan:   1. Essential hypertension, benign Jonathan Dyer will continue with his diet, exercise, and weight loss and will continue to monitor and watch for signs of hypotension with continued weight loss.  2. Obesity, Current BMI 34.0 Jonathan Dyer is currently in the action stage of change. As such, his goal is to continue with weight loss efforts. He has agreed to following a lower carbohydrate, vegetable and lean protein rich diet plan.   Patient is ok to add low sugar fruits 1-2 times per day. A list of low sugar fruits was given to him today.   Behavioral modification strategies: increasing lean protein intake and decreasing simple carbohydrates.  Jonathan Dyer has agreed to follow-up with our clinic in 3 to 4 weeks. He was informed of the importance of frequent follow-up visits to maximize his success with intensive lifestyle modifications for his multiple health conditions.   Objective:   Blood pressure 125/67, pulse (!) 58, temperature  97.8 F (36.6 C), height '6\' 3"'$  (1.905 m), weight 271 lb (122.9 kg), SpO2 96 %. Body mass index is 33.87 kg/m.  General: Cooperative, alert, well developed, in no acute distress. HEENT: Conjunctivae and lids unremarkable. Cardiovascular: Regular rhythm.  Lungs: Normal work of breathing. Neurologic: No focal deficits.   Lab Results  Component Value Date   CREATININE 1.46 (H) 03/25/2022   BUN 31 (H) 03/25/2022   NA 142 03/25/2022   K 3.9 03/25/2022   CL 104 03/25/2022   CO2 25 03/25/2022   Lab Results  Component Value Date   ALT 28 01/27/2022   AST 28 01/27/2022   ALKPHOS 106 01/27/2022   BILITOT 0.4 01/27/2022   Lab Results  Component Value Date   HGBA1C 6.0 (H) 01/27/2022   HGBA1C 5.5 10/03/2019   HGBA1C 5.7 10/21/2018   Lab Results  Component Value Date   INSULIN 12.6 01/27/2022   Lab Results  Component Value Date   TSH 1.890 01/27/2022   Lab Results  Component Value Date   CHOL 215 (H) 01/27/2022   HDL 52 01/27/2022   LDLCALC 132 (H) 01/27/2022   TRIG 172 (H) 01/27/2022   CHOLHDL 6 04/01/2021   Lab Results  Component Value Date   VD25OH 39.9 01/27/2022   VD25OH 30.49 04/01/2021   VD25OH 34 12/27/2019   Lab Results  Component Value Date   WBC 4.9 10/21/2018   HGB 14.8 10/21/2018   HCT 45 10/21/2018   MCV 91.5 06/28/2018   PLT 271 10/21/2018   No results found for: "  IRON", "TIBC", "FERRITIN"  Attestation Statements:   Reviewed by clinician on day of visit: allergies, medications, problem list, medical history, surgical history, family history, social history, and previous encounter notes.  Time spent on visit including pre-visit chart review and post-visit care and charting was 25 minutes.   I, Trixie Dredge, am acting as transcriptionist for Dennard Nip, MD.  I have reviewed the above documentation for accuracy and completeness, and I agree with the above. -  Dennard Nip, MD

## 2022-05-28 DIAGNOSIS — S93491A Sprain of other ligament of right ankle, initial encounter: Secondary | ICD-10-CM | POA: Diagnosis not present

## 2022-05-28 DIAGNOSIS — M79671 Pain in right foot: Secondary | ICD-10-CM | POA: Diagnosis not present

## 2022-06-15 DIAGNOSIS — H5203 Hypermetropia, bilateral: Secondary | ICD-10-CM | POA: Diagnosis not present

## 2022-06-15 DIAGNOSIS — H2513 Age-related nuclear cataract, bilateral: Secondary | ICD-10-CM | POA: Diagnosis not present

## 2022-06-16 ENCOUNTER — Ambulatory Visit (INDEPENDENT_AMBULATORY_CARE_PROVIDER_SITE_OTHER): Payer: Medicare Other | Admitting: Family Medicine

## 2022-06-16 ENCOUNTER — Encounter (INDEPENDENT_AMBULATORY_CARE_PROVIDER_SITE_OTHER): Payer: Self-pay | Admitting: Family Medicine

## 2022-06-16 VITALS — BP 126/68 | HR 54 | Ht 75.0 in | Wt 267.0 lb

## 2022-06-16 DIAGNOSIS — E669 Obesity, unspecified: Secondary | ICD-10-CM | POA: Insufficient documentation

## 2022-06-16 DIAGNOSIS — Z6833 Body mass index (BMI) 33.0-33.9, adult: Secondary | ICD-10-CM | POA: Diagnosis not present

## 2022-06-16 DIAGNOSIS — C61 Malignant neoplasm of prostate: Secondary | ICD-10-CM | POA: Diagnosis not present

## 2022-06-16 DIAGNOSIS — I10 Essential (primary) hypertension: Secondary | ICD-10-CM | POA: Diagnosis not present

## 2022-06-30 NOTE — Progress Notes (Unsigned)
Chief Complaint:   OBESITY Jonathan Dyer is here to discuss his progress with his obesity treatment plan along with follow-up of his obesity related diagnoses. Jonathan Dyer is on following a lower carbohydrate, vegetable and lean protein rich diet plan and states he is following his eating plan approximately 0% of the time. Jonathan Dyer states he is bike riding and doing resistance, and weights 3-4 times per week.    Today's visit was #: 6 Starting weight: 274 lbs Starting date: 01/27/2022 Today's weight: 267 lbs Today's date: 06/16/2022 Total lbs lost to date: 7 Total lbs lost since last in-office visit: 4  Interim History: Jonathan Dyer has done well with weight loss. He will be doing a 50 mile race in June and he is working up his endurance by 10% per week.   Subjective:   1. Prostate cancer ([place]) Jonathan Dyer was recently diagnosed with low grade prostate cancer, which will be monitored but no treatment at this time. He wonders if this will affect his eating plan.   2. Essential hypertension Jonathan Dyer's blood pressure is controlled on amlodipine and losartan. No signs of hypotension.   Assessment/Plan:   1. Prostate cancer ([place]) We discussed free radicals and antioxidants including antioxidant rich foods. Will continue to follow and answer nutrition questions.   2. Essential hypertension Jonathan Dyer will continue his medications, diet, and exercise, and will continue to monitor for signs that he needs his medications adjusted.   3. BMI 33.0-33.9,adult  4. Obesity, Beginning BMI 34.25 Jonathan Dyer is currently in the action stage of change. As such, his goal is to continue with weight loss efforts. He has agreed to change to the Category 3 Plan.   We will recheck fasting labs at his next visit.   Exercise goals: As is.   Behavioral modification strategies: increasing lean protein intake.  Jonathan Dyer has agreed to follow-up with our clinic in 4 weeks. He was informed of the importance of frequent follow-up visits to maximize his  success with intensive lifestyle modifications for his multiple health conditions.   Objective:   Blood pressure 126/68, pulse (!) 54, height 6' 3"$  (1.905 m), weight 267 lb (121.1 kg), SpO2 95 %. Body mass index is 33.37 kg/m.  General: Cooperative, alert, well developed, in no acute distress. HEENT: Conjunctivae and lids unremarkable. Cardiovascular: Regular rhythm.  Lungs: Normal work of breathing. Neurologic: No focal deficits.   Lab Results  Component Value Date   CREATININE 1.46 (H) 03/25/2022   BUN 31 (H) 03/25/2022   NA 142 03/25/2022   K 3.9 03/25/2022   CL 104 03/25/2022   CO2 25 03/25/2022   Lab Results  Component Value Date   ALT 28 01/27/2022   AST 28 01/27/2022   ALKPHOS 106 01/27/2022   BILITOT 0.4 01/27/2022   Lab Results  Component Value Date   HGBA1C 6.0 (H) 01/27/2022   HGBA1C 5.5 10/03/2019   HGBA1C 5.7 10/21/2018   Lab Results  Component Value Date   INSULIN 12.6 01/27/2022   Lab Results  Component Value Date   TSH 1.890 01/27/2022   Lab Results  Component Value Date   CHOL 215 (H) 01/27/2022   HDL 52 01/27/2022   LDLCALC 132 (H) 01/27/2022   TRIG 172 (H) 01/27/2022   CHOLHDL 6 04/01/2021   Lab Results  Component Value Date   VD25OH 39.9 01/27/2022   VD25OH 30.49 04/01/2021   VD25OH 34 12/27/2019   Lab Results  Component Value Date   WBC 4.9 10/21/2018   HGB 14.8  10/21/2018   HCT 45 10/21/2018   MCV 91.5 06/28/2018   PLT 271 10/21/2018   No results found for: "IRON", "TIBC", "FERRITIN"  Attestation Statements:   Reviewed by clinician on day of visit: allergies, medications, problem list, medical history, surgical history, family history, social history, and previous encounter notes.  Time spent on visit including pre-visit chart review and post-visit care and charting was 30 minutes.   I, Trixie Dredge, am acting as transcriptionist for Dennard Nip, MD.  I have reviewed the above documentation for accuracy and  completeness, and I agree with the above. -  Dennard Nip, MD

## 2022-07-03 ENCOUNTER — Encounter: Payer: Self-pay | Admitting: Family Medicine

## 2022-07-03 ENCOUNTER — Ambulatory Visit (INDEPENDENT_AMBULATORY_CARE_PROVIDER_SITE_OTHER): Payer: Medicare Other | Admitting: Family Medicine

## 2022-07-03 VITALS — BP 128/78 | HR 78 | Temp 97.9°F | Ht 75.0 in | Wt 277.0 lb

## 2022-07-03 DIAGNOSIS — I10 Essential (primary) hypertension: Secondary | ICD-10-CM

## 2022-07-03 DIAGNOSIS — R7303 Prediabetes: Secondary | ICD-10-CM | POA: Diagnosis not present

## 2022-07-03 DIAGNOSIS — C61 Malignant neoplasm of prostate: Secondary | ICD-10-CM | POA: Diagnosis not present

## 2022-07-03 DIAGNOSIS — R7989 Other specified abnormal findings of blood chemistry: Secondary | ICD-10-CM

## 2022-07-03 DIAGNOSIS — I480 Paroxysmal atrial fibrillation: Secondary | ICD-10-CM | POA: Diagnosis not present

## 2022-07-03 DIAGNOSIS — E7849 Other hyperlipidemia: Secondary | ICD-10-CM | POA: Diagnosis not present

## 2022-07-03 NOTE — Progress Notes (Signed)
Subjective:  [name], male    DOB: 1955-11-11  Age: 67 y.o. MRN: [id number]  CC:  Chief Complaint  Patient presents with   Hypertension    Pt notes a few days ago had bp going up and down, notes he takes [place] half tab prn notes since starting weight loss program pt notes needs it less, pt went off diet for a while post finding out about prostate cancer and is now trying to get back to diet,    Ankle Pain    Pt notes Rt foot and ankle pain with pinched nerves tried gabapentin for 3 days twice now and both times became dizzy and has bad dreams and so he is no longer going to take Gabapentin  FYI   Hyperlipidemia    Pt states has stopped rosuvastatin due to joint pain, he states he will not take this again  FYI     HPI Jonathan Dyer presents for follow up.   Has labs planned with weight mgt on 07/21/22.   Hypertension: With CAD, atrial fibrillation, bradycardia treated with flecainide, electrophysiology Dr. Quentin Ore.  Has declined ablation and avoids anticoagulation due to concern about fall/injury/bleed risk.  Watchman procedure has been discussed - CT scan planned March 15th to decide.  followed by healthy weight and wellness, appointment with Dr. Leafy Ro on February 6.  Variable readings, treated with amlodipine 10 mg daily, chlorthalidone half tablet as needed.  Improved blood pressures as diet has improved, followed by weight management as above. Creatinine range 1.23-1.34 over the past few years, slight elevated reading recently. Amlodipine '10mg'$  qd, losartan '100mg'$  qd.  Chlorthalidone - not needed recently.  Only rare furosemide.  Home readings: 137/60. Sometimes higher.  Riding bike. Will be riding in unbound gravel race in [place] in June.  BP Readings from Last 3 Encounters:  07/03/22 128/78  06/16/22 126/68  05/12/22 125/67   Lab Results  Component Value Date   CREATININE 1.46 (H) 03/25/2022   Hyperlipidemia: With CAD, previously treated  with Crestor '5mg'$ .  He has stopped that medicine due to joint pains.  Cardiology visit/EP visit November 15. Off and on statin, muscle aches all over, better off med then return with restart of med.  Did not tolerate lipitor Pravastatin was not effective enough.  Lab Results  Component Value Date   CHOL 215 (H) 01/27/2022   HDL 52 01/27/2022   LDLCALC 132 (H) 01/27/2022   TRIG 172 (H) 01/27/2022   CHOLHDL 6 04/01/2021   Lab Results  Component Value Date   ALT 28 01/27/2022   AST 28 01/27/2022   ALKPHOS 106 01/27/2022   BILITOT 0.4 01/27/2022   Prostate cancer followed by urology. Dr. Louis Meckel, on active surveillance.  Plan for confirmation biopsy 6 months after initial diagnosis with MRI prior.  PSA checks every 6 months, DRE once a year.  Doing ok with that diagnosis.   Prediabetes: Diet/exercise approach, followed by weight management.  Weight 278 at her last visit in November, similar today.   Lab Results  Component Value Date   HGBA1C 6.0 (H) 01/27/2022   Wt Readings from Last 3 Encounters:  07/03/22 277 lb (125.6 kg)  06/16/22 267 lb (121.1 kg)  05/12/22 271 lb (122.9 kg)   Right foot/ankle pain Treated by [facility].  As above he tried gabapentin a few times but did not tolerate that medication. Still followed by ortho. Next appt in March. Considering PT.   History Patient Active Problem List  Diagnosis Date Noted   Prostate cancer ([place]) 06/16/2022   Essential hypertension 06/16/2022   BMI 33.0-33.9,adult 06/16/2022   Obesity, Beginning BMI 34.25 06/16/2022   Prediabetes 03/03/2022   Other hyperlipidemia 03/03/2022   Vitamin D deficiency 03/03/2022   Class 1 obesity with serious comorbidity and body mass index (BMI) of 34.0 to 34.9 in adult 03/03/2022   Arthritis of carpometacarpal [facility]) joint of left thumb 02/05/2020   Gastroesophageal reflux disease without esophagitis 04/17/2019   Localized, primary osteoarthritis of hand 09/06/2018   Trigger finger of  right hand 09/06/2018   Osteoarthritis of carpometacarpal [facility]) joint of thumb 09/06/2018   Renal cyst 07/04/2018   Colonic mass 01/03/2018   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   Screen for colon cancer 12/29/2017   Arthralgia of right foot 08/17/2017   Degeneration of lumbar intervertebral disc 08/04/2017   Osteoarthritis of right knee 07/20/2017   Spinal stenosis 07/20/2017   Hypertensive disorder 01/01/2016   Hx of adenomatous colonic polyps 03/01/2015   Pain in joint, shoulder region 01/26/2012   Impingement syndrome of left shoulder 01/26/2012   Muscle weakness (generalized) 01/26/2012   Hyperlipidemia, mixed 10/11/2009   Obstructive sleep apnea 10/11/2009   PAROXYSMAL ATRIAL FIBRILLATION 10/11/2009   CAD (coronary artery disease), native coronary artery 08/21/2009   Benign essential hypertension 11/09/2008   Past Medical History:  Diagnosis Date   Allergy    Arthritis    Atrial fibrillation ([place])    Diagnosed 09/2009, on flecainide   Back pain    Bilateral swelling of feet    Bradycardia    Event monitor 2010: HR down to 45, asymptomatic.   Cataract    Coronary atherosclerosis of native coronary artery    Nonobstructive and distal disease by cath 2005.    Essential hypertension    Hyperlipidemia    Joint pain    OSA (obstructive sleep apnea)    Osteoarthritis    Overweight    Prediabetes    Renal cyst    Ringing in ears    Sleep apnea    Vitamin D deficiency    Past Surgical History:  Procedure Laterality Date   BIOPSY  01/04/2018   Procedure: BIOPSY;  Surgeon: [name], MD;  [facility];  Service: Endoscopy;;  ascending colon    CARDIAC CATHETERIZATION  2005   COLONOSCOPY  05/2006   SLF: 3 mm sigmoid: Tubular adenoma removed.   COLONOSCOPY N/A 05/06/2016   Dr. [name]: two sessile polyps in mid transverse colon and hepatic flexure (tubular adenomas). ext/int hemorrhoids. tortuous left colon.    COLONOSCOPY WITH PROPOFOL N/A 01/04/2018   Procedure:  COLONOSCOPY WITH PROPOFOL;  Surgeon: [name], MD;  Location: [facility];  Service: Endoscopy;  Laterality: N/A;  2:30pm   KNEE ARTHROSCOPY     Left anterior cruciate ligament reconstruction     left shoulder surgery     dr . Herbie [place] collins    POLYPECTOMY  05/06/2016   Procedure: POLYPECTOMY;  Surgeon: [name], MD;  Location: [facility];  Service: Endoscopy;;  transverse colon polyp, hepatic flexure polyp,    Right shoulder surgery     ROBOT ASSISTED LAPAROSCOPIC NEPHRECTOMY Left 07/04/2018   Procedure: XI ROBOTIC ASSISTED LAPAROSCOPIC RENAL CYST DECORTICATION;  Surgeon: [name], MD;  Location: [facility];  Service: Urology;  Laterality: Left;  3 HRSPROCEDURE: ROBOTIC RENAL CYST DECORTICATION   TONSILLECTOMY     TOTAL KNEE ARTHROPLASTY     Allergies  Allergen Reactions  Clindamycin/Lincomycin Swelling and Other (See Comments)    Mycin drug family    Penicillin G Other (See Comments)   Penicillins Other (See Comments)    Has patient had a PCN reaction causing immediate rash, facial/tongue/throat swelling, SOB or lightheadedness with hypotension: unknown Has patient had a PCN reaction causing severe rash involving mucus membranes or skin necrosis: unknown Has patient had a PCN reaction that required hospitalization unknown Has patient had a PCN reaction occurring within the last 10 years: unknown If all of the above answers are "NO", then may proceed    Pneumococcal 13-Val Conj Vacc Swelling    Patient stated that he has severe swelling in his arm from vaccine and swollen up to his collar bone. Lasted about 2 days and did not feel well.    Prior to Admission medications   Medication Sig Start Date End Date Taking? Authorizing Provider  amLODipine (NORVASC) 10 MG tablet TAKE 1 TABLET BY MOUTH DAILY 03/30/22  Yes Satira Sark, MD  chlorthalidone (HYGROTON) 25 MG tablet TAKE ONE-HALF TABLET BY MOUTH  DAILY 11/13/21  Yes Satira Sark, MD  Coenzyme Q10  (COQ10) 100 MG CAPS Take 1 capsule by mouth daily.   Yes [provider]  flecainide (TAMBOCOR) 50 MG tablet TAKE 1 TABLET BY MOUTH TWICE  DAILY 03/30/22  Yes Satira Sark, MD  furosemide (LASIX) 40 MG tablet TAKE 1 TABLET BY MOUTH DAILY AS  NEEDED FOR FLUID 04/06/22  Yes Satira Sark, MD  losartan (COZAAR) 100 MG tablet TAKE 1 TABLET BY MOUTH DAILY 03/30/22  Yes Satira Sark, MD  Multiple Vitamin (MULTIVITAMIN) tablet Take 1 tablet by mouth daily.   Yes [provider]  omeprazole (PRILOSEC) 20 MG capsule TAKE 1 CAPSULE BY MOUTH DAILY AS NEEDED FOR ACID REFLUX,  INDIGESTION 04/06/22  Yes Satira Sark, MD  potassium chloride (KLOR-CON) 10 MEQ tablet TAKE 1 TABLET BY MOUTH DAILY 11/13/21  Yes Satira Sark, MD  tamsulosin (FLOMAX) 0.4 MG CAPS capsule Take 0.4 mg by mouth. Takes occasionally.   Yes [provider]  apixaban (ELIQUIS) 5 MG TABS tablet Take 1 tablet (5 mg total) by mouth 2 (two) times daily. Patient not taking: Reported on 04/06/2022 03/23/22   Satira Sark, MD  rosuvastatin (CRESTOR) 5 MG tablet TAKE 1 TABLET BY MOUTH DAILY Patient not taking: Reported on 07/03/2022 01/05/22   Satira Sark, MD   Social History   Socioeconomic History   Marital status: Divorced    Spouse name: Not on file   Number of children: 3   Years of education: Not on file   Highest education level: Not on file  Occupational History   Occupation: PT-custodial work   Occupation: Retired-work part time driving a Printmaker  Tobacco Use   Smoking status: Never   Smokeless tobacco: Never  Vaping Use   Vaping Use: Never used  Substance and Sexual Activity   Alcohol use: No    Alcohol/week: 0.0 standard drinks of alcohol   Drug use: No   Sexual activity: Not Currently  Other Topics Concern   Not on file  Social History Narrative   Divorced since 1986,married for 10 years.Lives alone.Part time work,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    Social Determinants of Health   Financial Resource Strain: Not on file  Food Insecurity: Not on file  Transportation Needs: Not on file  Physical Activity: Not on file  Stress: Not on file  Social Connections: Not on file  Intimate Partner Violence: Not on file    Review of Systems  Constitutional:  Negative for fatigue and unexpected weight change.  Eyes:  Negative for visual disturbance.  Respiratory:  Negative for cough, chest tightness and shortness of breath.   Cardiovascular:  Negative for chest pain, palpitations and leg swelling.  Gastrointestinal:  Negative for abdominal pain and blood in stool.  Neurological:  Negative for dizziness, light-headedness and headaches.     Objective:   Vitals:   07/03/22 0811  BP: 128/78  Pulse: 78  Temp: 97.9 F (36.6 C)  TempSrc: Temporal  SpO2: 96%  Weight: 277 lb (125.6 kg)  Height: '6\' 3"'$  (1.905 m)     Physical Exam Vitals reviewed.  Constitutional:      Appearance: He is well-developed.  HENT:     Head: Normocephalic and atraumatic.  Neck:     Vascular: No carotid bruit or JVD.  Cardiovascular:     Rate and Rhythm: Normal rate and regular rhythm.     Heart sounds: Normal heart sounds. No murmur heard. Pulmonary:     Effort: Pulmonary effort is normal.     Breath sounds: Normal breath sounds. No rales.  Musculoskeletal:     Right lower leg: No edema.     Left lower leg: No edema.  Skin:    General: Skin is warm and dry.  Neurological:     Mental Status: He is alert and oriented to person, place, and time.  Psychiatric:        Mood and Affect: Mood normal.        Assessment & Plan:  Jonathan Dyer is a 67 y.o. male . Essential hypertension Elevated serum creatinine  -Tolerating current med regimen, some variable readings with intermittent use of chlorthalidone.  RTC precautions discussed if persistent variable readings to establish more consistent med regimen.  Slight elevation most recent  creatinine, plans labs with weight management specialist next few weeks.  Likely will have checked at that time or I can place a lab only order.  Prediabetes  -Continue to watch diet, exercise, followed by healthy weight and wellness, anticipate A1c will be drawn in the next few weeks.  Labs deferred today.  Prostate cancer [facility])  -On active surveillance, continue follow-up with urology.  Paroxysmal atrial fibrillation ([place])  -Plan for CT, possible Watchman procedure as above.  Stable.  Other hyperlipidemia - Plan: AMB Referral to [facility]  -Unfortunately myalgias with statin, improve off statin then recur with statin.  Has tried both Lipitor, Crestor with similar symptoms, and pravastatin was not sufficient enough to treat LDL.  Will refer to lipid specialist to consider PCSK9 inhibitor or other treatment options.   No orders of the defined types were placed in this encounter.  Patient Instructions  Please keep an eye on your blood pressure at home and if you continue to have elevated or low readings, schedule a visit and we can discuss your medications further.  No changes for now.  If there are any concerns on your labs I will let you know.  I will refer you to cholesterol specialist. No new meds for now.   Keep follow up as planned with specialists as planned. I would like to see kidney function, A1c, cholesterol. Those are likely going to be checked by other specialist, but if not let me know and I will place those orders.       Signed,   Merri Ray, MD [place], [facility]  [facility] 07/03/22 8:44 AM

## 2022-07-03 NOTE — Patient Instructions (Addendum)
Please keep an eye on your blood pressure at home and if you continue to have elevated or low readings, schedule a visit and we can discuss your medications further.  No changes for now.  If there are any concerns on your labs I will let you know.  I will refer you to cholesterol specialist. No new meds for now.   Keep follow up as planned with specialists as planned. I would like to see kidney function, A1c, cholesterol. Those are likely going to be checked by other specialist, but if not let me know and I will place those orders.

## 2022-07-21 ENCOUNTER — Encounter (INDEPENDENT_AMBULATORY_CARE_PROVIDER_SITE_OTHER): Payer: Self-pay | Admitting: Family Medicine

## 2022-07-21 ENCOUNTER — Ambulatory Visit (INDEPENDENT_AMBULATORY_CARE_PROVIDER_SITE_OTHER): Payer: Medicare Other | Admitting: Family Medicine

## 2022-07-21 VITALS — BP 136/69 | HR 50 | Ht 75.0 in | Wt 274.0 lb

## 2022-07-21 DIAGNOSIS — E782 Mixed hyperlipidemia: Secondary | ICD-10-CM | POA: Diagnosis not present

## 2022-07-21 DIAGNOSIS — M791 Myalgia, unspecified site: Secondary | ICD-10-CM

## 2022-07-21 DIAGNOSIS — E559 Vitamin D deficiency, unspecified: Secondary | ICD-10-CM

## 2022-07-21 DIAGNOSIS — R7989 Other specified abnormal findings of blood chemistry: Secondary | ICD-10-CM | POA: Diagnosis not present

## 2022-07-21 DIAGNOSIS — Z6834 Body mass index (BMI) 34.0-34.9, adult: Secondary | ICD-10-CM

## 2022-07-21 DIAGNOSIS — R7303 Prediabetes: Secondary | ICD-10-CM

## 2022-07-21 DIAGNOSIS — E669 Obesity, unspecified: Secondary | ICD-10-CM

## 2022-07-22 ENCOUNTER — Telehealth (HOSPITAL_COMMUNITY): Payer: Self-pay | Admitting: *Deleted

## 2022-07-22 ENCOUNTER — Telehealth (HOSPITAL_COMMUNITY): Payer: Self-pay | Admitting: Emergency Medicine

## 2022-07-22 LAB — CBC WITH DIFFERENTIAL/PLATELET
Basophils Absolute: 0.1 10*3/uL (ref 0.0–0.2)
Basos: 1 %
EOS (ABSOLUTE): 0.3 10*3/uL (ref 0.0–0.4)
Eos: 6 %
Hematocrit: 46.5 % (ref 37.5–51.0)
Hemoglobin: 15.7 g/dL (ref 13.0–17.7)
Immature Grans (Abs): 0 10*3/uL (ref 0.0–0.1)
Immature Granulocytes: 0 %
Lymphocytes Absolute: 1.6 10*3/uL (ref 0.7–3.1)
Lymphs: 30 %
MCH: 30.5 pg (ref 26.6–33.0)
MCHC: 33.8 g/dL (ref 31.5–35.7)
MCV: 90 fL (ref 79–97)
Monocytes Absolute: 0.5 10*3/uL (ref 0.1–0.9)
Monocytes: 8 %
Neutrophils Absolute: 3 10*3/uL (ref 1.4–7.0)
Neutrophils: 55 %
Platelets: 253 10*3/uL (ref 150–450)
RBC: 5.15 x10E6/uL (ref 4.14–5.80)
RDW: 12.6 % (ref 11.6–15.4)
WBC: 5.5 10*3/uL (ref 3.4–10.8)

## 2022-07-22 LAB — LIPID PANEL WITH LDL/HDL RATIO
Cholesterol, Total: 262 mg/dL — ABNORMAL HIGH (ref 100–199)
HDL: 48 mg/dL (ref >39)
LDL Chol Calc (NIH): 190 mg/dL — ABNORMAL HIGH (ref 0–99)
LDL/HDL Ratio: 4 ratio — ABNORMAL HIGH (ref 0.0–3.6)
Triglycerides: 134 mg/dL (ref 0–149)
VLDL Cholesterol Cal: 24 mg/dL (ref 5–40)

## 2022-07-22 LAB — CMP14+EGFR
ALT: 13 IU/L (ref 0–44)
AST: 13 IU/L (ref 0–40)
Albumin/Globulin Ratio: 1.7 (ref 1.2–2.2)
Albumin: 4.5 g/dL (ref 3.9–4.9)
Alkaline Phosphatase: 95 IU/L (ref 44–121)
BUN/Creatinine Ratio: 16 (ref 10–24)
BUN: 21 mg/dL (ref 8–27)
Bilirubin Total: 0.4 mg/dL (ref 0.0–1.2)
CO2: 23 mmol/L (ref 20–29)
Calcium: 9.5 mg/dL (ref 8.6–10.2)
Chloride: 104 mmol/L (ref 96–106)
Creatinine, Ser: 1.35 mg/dL — ABNORMAL HIGH (ref 0.76–1.27)
Globulin, Total: 2.6 g/dL (ref 1.5–4.5)
Glucose: 91 mg/dL (ref 70–99)
Potassium: 4.4 mmol/L (ref 3.5–5.2)
Sodium: 144 mmol/L (ref 134–144)
Total Protein: 7.1 g/dL (ref 6.0–8.5)
eGFR: 57 mL/min/{1.73_m2} — ABNORMAL LOW (ref >59)

## 2022-07-22 LAB — HEMOGLOBIN A1C
Est. average glucose Bld gHb Est-mCnc: 117 mg/dL
Hgb A1c MFr Bld: 5.7 % — ABNORMAL HIGH (ref 4.8–5.6)

## 2022-07-22 LAB — CK: Total CK: 92 U/L (ref 41–331)

## 2022-07-22 LAB — IRON AND TIBC
Iron Saturation: 31 % (ref 15–55)
Iron: 86 ug/dL (ref 38–169)
Total Iron Binding Capacity: 280 ug/dL (ref 250–450)
UIBC: 194 ug/dL (ref 111–343)

## 2022-07-22 LAB — FERRITIN: Ferritin: 228 ng/mL (ref 30–400)

## 2022-07-22 LAB — VITAMIN B12: Vitamin B-12: 580 pg/mL (ref 232–1245)

## 2022-07-22 LAB — TSH: TSH: 2.56 u[IU]/mL (ref 0.450–4.500)

## 2022-07-22 LAB — FOLATE: Folate: 6.7 ng/mL (ref >3.0)

## 2022-07-22 LAB — MAGNESIUM: Magnesium: 2.1 mg/dL (ref 1.6–2.3)

## 2022-07-22 LAB — INSULIN, RANDOM: INSULIN: 11.5 u[IU]/mL (ref 2.6–24.9)

## 2022-07-22 LAB — VITAMIN D 25 HYDROXY (VIT D DEFICIENCY, FRACTURES): Vit D, 25-Hydroxy: 43.3 ng/mL (ref 30.0–100.0)

## 2022-07-22 NOTE — Telephone Encounter (Signed)
Attempted to call patient regarding upcoming cardiac CT appointment. °Left message on voicemail with name and callback number °Kaitlynd Phillips RN Navigator Cardiac Imaging °[facility] °[phone] Office °[phone] Cell ° °

## 2022-07-22 NOTE — Telephone Encounter (Signed)
Patient returning call about his upcoming cardiac imaging study; pt verbalizes understanding of appt date/time, parking situation and where to check in, pre-test NPO status and verified current allergies; name and call back number provided for further questions should they arise  Gordy Clement RN Navigator Cardiac Imaging [facility] [phone] office [phone] cell  Patient to take his daily medications and is aware to arrive at 8am.

## 2022-07-24 ENCOUNTER — Ambulatory Visit (HOSPITAL_COMMUNITY)
Admission: RE | Admit: 2022-07-24 | Discharge: 2022-07-24 | Disposition: A | Discharge status: Home / Self Care | Payer: Medicare Other | Source: Ambulatory Visit | Attending: Cardiology | Admitting: Cardiology

## 2022-07-24 DIAGNOSIS — Z01818 Encounter for other preprocedural examination: Secondary | ICD-10-CM | POA: Diagnosis not present

## 2022-07-24 DIAGNOSIS — I480 Paroxysmal atrial fibrillation: Secondary | ICD-10-CM | POA: Insufficient documentation

## 2022-07-24 MED ORDER — IOHEXOL 350 MG/ML SOLN
100.0000 mL | Freq: Once | INTRAVENOUS | Status: AC | PRN
  Administered 2022-07-24: 100 mL via INTRAVENOUS

## 2022-07-29 NOTE — Progress Notes (Unsigned)
Chief Complaint:   OBESITY Jonathan Dyer is here to discuss his progress with his obesity treatment plan along with follow-up of his obesity related diagnoses. Jonathan Dyer is on the Category 3 Plan and states he is following his eating plan approximately 0% of the time. Jonathan Dyer states he is riding the bike for 120 minutes 2 times per week.  Today's visit was #: 7 Starting weight: 274 lbs Starting date: 01/27/2022 Today's weight: 274 lbs Today's date: 07/21/2022 Total lbs lost to date: 0 Total lbs lost since last in-office visit: 0  Interim History: Jonathan Dyer has been off track with his eating. He notes more celebration eating and he hasn't been feeling well overall. He is working on getting back on track with his eating plan and exercise.   Subjective:   1. Elevated serum creatinine Jonathan Dyer's last creatinine has been elevated. He is trying to hydrate more. He also notes increased fasting and pain in his feet.   2. Prediabetes Jonathan Dyer has been off track with his diet. He notes polyphagia and bilateral foot pain in soles of his feet.   3. Hyperlipidemia, mixed Jonathan Dyer did not tolerate statins, and he has been referred to the Lipid clinic to look at PCSK9 inhibitor option.  4. Vitamin D deficiency Jonathan Dyer is on Vitamin D and he is due for labs.   5. Myalgia Jonathan Dyer notes increased body aches, worse in the feet and back and right abdomen. He exercises regularly and his BUN has increased.  He is no longer on a statin but his symptoms have increased despite this.  Assessment/Plan:   1. Elevated serum creatinine We will check labs today, and we will follow-up at Tyrik's next visit.   - CMP14+EGFR - Folate - Iron and TIBC - Ferritin  2. Prediabetes We will check labs today, and we will follow-up at Lukah's next visit.   - Vitamin B12 - Insulin, random - Hemoglobin A1c - Iron and TIBC  3. Hyperlipidemia, mixed We will check labs today. Jonathan Dyer is to work on his diet, and will follow-up with the Lipid clinic.    - Lipid Panel With LDL/HDL Ratio - TSH - CBC with Differential/Platelet  4. Vitamin D deficiency We will check labs today, and will follow-up at Jonathan Dyer's next visit.   - VITAMIN D 25 Hydroxy (Vit-D Deficiency, Fractures)  5. Myalgia We will check labs today, and will follow-up at Jonathan Dyer's next visit.   - Magnesium - TSH - CK  6. BMI 34.0-34.9,adult  7. Obesity, Beginning BMI 34.25 Jonathan Dyer is currently in the action stage of change. As such, his goal is to continue with weight loss efforts. He has agreed to the Category 3 Plan.   Exercise goals: Increase biking as weather permits.   Behavioral modification strategies: increasing lean protein intake.  Jonathan Dyer has agreed to follow-up with our clinic in 4 weeks. He was informed of the importance of frequent follow-up visits to maximize his success with intensive lifestyle modifications for his multiple health conditions.   Jonathan Dyer was informed we would discuss his lab results at his next visit unless there is a critical issue that needs to be addressed sooner. Jonathan Dyer agreed to keep his next visit at the agreed upon time to discuss these results.  Objective:   Blood pressure 136/69, pulse (!) 50, height 6\' 3"  (1.905 m), weight 274 lb (124.3 kg), SpO2 96 %. Body mass index is 34.25 kg/m.  Lab Results  Component Value Date   CREATININE 1.35 (H) 07/21/2022  BUN 21 07/21/2022   NA 144 07/21/2022   K 4.4 07/21/2022   CL 104 07/21/2022   CO2 23 07/21/2022   Lab Results  Component Value Date   ALT 13 07/21/2022   AST 13 07/21/2022   ALKPHOS 95 07/21/2022   BILITOT 0.4 07/21/2022   Lab Results  Component Value Date   HGBA1C 5.7 (H) 07/21/2022   HGBA1C 6.0 (H) 01/27/2022   HGBA1C 5.5 10/03/2019   HGBA1C 5.7 10/21/2018   Lab Results  Component Value Date   INSULIN 11.5 07/21/2022   INSULIN 12.6 01/27/2022   Lab Results  Component Value Date   TSH 2.560 07/21/2022   Lab Results  Component Value Date   CHOL 262 (H)  07/21/2022   HDL 48 07/21/2022   LDLCALC 190 (H) 07/21/2022   TRIG 134 07/21/2022   CHOLHDL 6 04/01/2021   Lab Results  Component Value Date   VD25OH 43.3 07/21/2022   VD25OH 39.9 01/27/2022   VD25OH 30.49 04/01/2021   Lab Results  Component Value Date   WBC 5.5 07/21/2022   HGB 15.7 07/21/2022   HCT 46.5 07/21/2022   MCV 90 07/21/2022   PLT 253 07/21/2022   Lab Results  Component Value Date   IRON 86 07/21/2022   TIBC 280 07/21/2022   FERRITIN 228 07/21/2022   Attestation Statements:   Reviewed by clinician on day of visit: allergies, medications, problem list, medical history, surgical history, family history, social history, and previous encounter notes.   I, Trixie Dredge, am acting as transcriptionist for Dennard Nip, MD.  I have reviewed the above documentation for accuracy and completeness, and I agree with the above. -  Dennard Nip, MD

## 2022-07-29 NOTE — Progress Notes (Unsigned)
Cardiology Office Note  Date: 07/30/2022   ID: Jonathan, Dyer Aug 11, 1955, MRN [id number]  History of Present Illness: Jonathan Dyer is a 67 y.o. male last seen in November 2023.  He had interval evaluation with Dr. Quentin Ore for discussion of Watchman implantation.  Cardiac CT obtained as part of workup.  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  Anatomy was favorable for Watchman implantation.  He did have a significantly elevated coronary calcium score of 5250, most prominent LAD and RCA distribution.  He has a history of nonobstructive CAD by cardiac catheterization in 2005 with follow-up noninvasive ischemic testing that has been reassuring.  Last evaluation was via GXT in August 2020.  Echocardiogram in December 2023 revealed normal LVEF at 55 to 60%.  He is here for follow-up.  We discussed the results of his cardiac CT and echocardiogram in detail including implications of significantly elevated coronary calcium score.  He continues to do well with no active angina or unusual shortness of breath.  Rides his bike regularly with heart rate in the 100-120 range, 20 to 30 miles.  He does not report any change in stamina.  Also no significant change in atrial fibrillation burden from the perspective of palpitations.  Reviewed his recent lab work, LDL 190.  He has a prior history of statin myalgias and fatigue including Lipitor years back and more recently on Crestor 5 mg daily.  He was referred by PCP to the lipid clinic, but visit is scheduled in August.  He would be a good candidate for PCSK9 inhibitors.  Physical Exam: VS:  BP 122/68   Pulse (!) 51   Ht 6\' 3"  (1.905 m)   Wt 279 lb 9.6 oz (126.8 kg)   SpO2 97%   BMI 34.95 kg/m , BMI Body mass index is 34.95 kg/m.  Wt Readings from Last 3 Encounters:  07/30/22 279 lb 9.6 oz (126.8 kg)  07/21/22 274 lb (124.3 kg)  07/03/22  277 lb (125.6 kg)    General: Patient appears comfortable at rest. HEENT: Conjunctiva and lids normal. Neck: Supple, no elevated JVP or carotid bruits. Lungs: Clear to auscultation, nonlabored breathing at rest. Cardiac: Regular rate and rhythm, no S3 or significant systolic murmur. Abdomen: Soft, nontender, bowel sounds present. Extremities: No pitting edema.  ECG:  An ECG dated 03/25/2022 was personally reviewed today and demonstrated:  Sinus rhythm with QRS 120 ms.  Labwork: 07/21/2022: ALT 13; AST 13; BUN 21; Creatinine, Ser 1.35; Hemoglobin 15.7; Magnesium 2.1; Platelets 253; Potassium 4.4; Sodium 144; TSH 2.560     Component Value Date/Time   CHOL 262 (H) 07/21/2022 0802   TRIG 134 07/21/2022 0802   HDL 48 07/21/2022 0802   CHOLHDL 6 04/01/2021 0813   VLDL 24.4 04/01/2021 0813   LDLCALC 190 (H) 07/21/2022 0802   LDLCALC 131 (H) 10/03/2019 0926   Other Studies Reviewed Today:  Echocardiogram 04/21/2022:  1. Left ventricular ejection fraction, by estimation, is 55 to 60%. The  left ventricle has normal function. The left ventricle has no regional  wall motion abnormalities. Left ventricular diastolic parameters are  indeterminate. The average left  ventricular global longitudinal strain is -22.7 %. The global longitudinal  strain is normal.   2. Right ventricular systolic function not well visualized but appears to  be mildly reduced. The right ventricular size is mildly enlarged.   3. The mitral valve  is grossly normal. Mild mitral valve regurgitation.  No evidence of mitral stenosis.   4. The aortic valve is tricuspid. There is mild calcification of the  aortic valve. Aortic valve regurgitation is not visualized. No aortic  stenosis is present.   5. Aortic dilatation noted. There is mild dilatation of the aortic root,  measuring 44 mm. There is mild dilatation of the ascending aorta,  measuring 44 mm. There is mild dilatation of the aortic arch, measuring 40  mm.    Assessment and Plan:  1.  Paroxysmal to persistent atrial fibrillation with CHA2DS2-VASc score of 4.  He is not on AV nodal blockers given resting bradycardia, has been on flecainide providing good rhythm suppression over the years.  LVEF 55 to 60%.  He was referred for Watchman implantation discussion with Dr. Quentin Ore, patient has preferred to hold off on anticoagulation as discussed in the prior notes.  2.  Significantly elevated coronary calcium score of 5250, predominantly LAD and RCA distribution.  He has prior history of nonobstructive CAD at cardiac catheterization in 2005 with interval noninvasive ischemic testing reassuring, most recently by GXT in 2020.  He reports no obvious symptomatology at this time with regular exercise including fairly long bike rides outdoors as discussed above.  I would like further objective exclusion of ischemia however and we discussed proceeding with an exercise (Lexiscan if necessary) Myoview.  Also very important to get his lipids under control, we discussed this today as well.  3.  Mixed hyperlipidemia, recent lab work reviewed with LDL significantly elevated at 190.  He has a history of statin myalgias and fatigue including Lipitor and more recently Crestor at 5 mg daily.  He is a good candidate for PCSK9 inhibitors, we will reach out to the lipid clinic to see which is covered best by his insurance and go ahead and initiate therapy rather than waiting until August for him to see Dr. Debara Pickett.  He needs to get his LDL 55 or less.  4.  Essential hypertension.  Blood pressure well-controlled today.  He is on Norvasc and Cozaar, uses chlorthalidone intermittently.  Disposition:  Follow up  3 months.  Signed, Satira Sark, M.D., F.A.C.C.

## 2022-07-30 ENCOUNTER — Ambulatory Visit: Payer: Medicare Other | Attending: Cardiology | Admitting: Cardiology

## 2022-07-30 ENCOUNTER — Encounter: Payer: Self-pay | Admitting: Cardiology

## 2022-07-30 ENCOUNTER — Telehealth: Payer: Self-pay | Admitting: Cardiology

## 2022-07-30 ENCOUNTER — Encounter: Payer: Self-pay | Admitting: *Deleted

## 2022-07-30 ENCOUNTER — Telehealth: Payer: Self-pay | Admitting: Internal Medicine

## 2022-07-30 VITALS — BP 122/68 | HR 51 | Ht 75.0 in | Wt 279.6 lb

## 2022-07-30 DIAGNOSIS — R931 Abnormal findings on diagnostic imaging of heart and coronary circulation: Secondary | ICD-10-CM

## 2022-07-30 DIAGNOSIS — E782 Mixed hyperlipidemia: Secondary | ICD-10-CM

## 2022-07-30 DIAGNOSIS — I2510 Atherosclerotic heart disease of native coronary artery without angina pectoris: Secondary | ICD-10-CM | POA: Diagnosis not present

## 2022-07-30 DIAGNOSIS — Z789 Other specified health status: Secondary | ICD-10-CM

## 2022-07-30 DIAGNOSIS — I480 Paroxysmal atrial fibrillation: Secondary | ICD-10-CM

## 2022-07-30 NOTE — Telephone Encounter (Signed)
Patient states Dr. Domenic Polite sent a message to Dr. Debara Pickett for sooner Lipid clinic appointment (8/27). He states someone called him to make arrangements. I do not see anything documented and Lipid schedulers have not called. Is anyone able to confirm this? Please contact patient to discuss.

## 2022-07-30 NOTE — Telephone Encounter (Signed)
Called patient and scheduled him for sooner visit on 10/02/22 with Dr. Debara Pickett in the [place] office.

## 2022-07-30 NOTE — Patient Instructions (Addendum)
Medication Instructions:  Your physician recommends that you continue on your current medications as directed. Please refer to the Current Medication list given to you today.  Labwork: none  Testing/Procedures: Your physician has requested that you have en exercise stress myoview. For further information please visit [email or web address]. Please follow instruction sheet, as given.  Follow-Up: Your physician recommends that you schedule a follow-up appointment in: 3 months  Any Other Special Instructions Will Be Listed Below (If Applicable). You have been referred to PharmD for lipid medication management  If you need a refill on your cardiac medications before your next appointment, please call your pharmacy.

## 2022-07-30 NOTE — Telephone Encounter (Signed)
Patient states Calcium score was extremely high and his doctor, Dr. Domenic Polite states he needs to be seen ASAP or medication changes.  He asked Dr Debara Pickett to please advise as he is concerned.  He is also set for Stress test on Monday, if for any reason he should not do this test please let him know.  I advised to keep the appointment unless he hears from [place].  He is not scheduled until August with Dr Debara Pickett

## 2022-07-30 NOTE — Telephone Encounter (Signed)
Patient states he is returning a call received today.

## 2022-07-30 NOTE — Telephone Encounter (Signed)
Checking percert on the following patient for testing scheduled at [facility].     EX. STRESS 08/03/2022

## 2022-08-03 ENCOUNTER — Other Ambulatory Visit: Payer: Self-pay

## 2022-08-03 ENCOUNTER — Ambulatory Visit (HOSPITAL_BASED_OUTPATIENT_CLINIC_OR_DEPARTMENT_OTHER)
Admission: RE | Admit: 2022-08-03 | Discharge: 2022-08-03 | Disposition: A | Discharge status: Home / Self Care | Payer: Medicare Other | Source: Ambulatory Visit | Attending: Cardiology | Admitting: Cardiology

## 2022-08-03 ENCOUNTER — Ambulatory Visit (HOSPITAL_COMMUNITY)
Admission: RE | Admit: 2022-08-03 | Discharge: 2022-08-03 | Disposition: A | Discharge status: Home / Self Care | Payer: Medicare Other | Source: Ambulatory Visit | Attending: Cardiology | Admitting: Cardiology

## 2022-08-03 ENCOUNTER — Telehealth: Payer: Self-pay

## 2022-08-03 DIAGNOSIS — I2510 Atherosclerotic heart disease of native coronary artery without angina pectoris: Secondary | ICD-10-CM

## 2022-08-03 LAB — NM MYOCAR MULTI W/SPECT W/WALL MOTION / EF
Angina Index: 0
Duke Treadmill Score: 9
Estimated workload: 10.1
Exercise duration (min): 8 min
Exercise duration (sec): 30 s
LV dias vol: 121 mL (ref 62–150)
LV sys vol: 57 mL
MPHR: 151 {beats}/min
Nuc Stress EF: 53 %
Peak HR: 134 {beats}/min
Percent HR: 88 %
RATE: 0.4
RPE: 14
Rest HR: 59 {beats}/min
Rest Nuclear Isotope Dose: 10.1 mCi
SDS: 3
SRS: 1
SSS: 4
ST Depression (mm): 0 mm
Stress Nuclear Isotope Dose: 30.3 mCi
TID: 0.94

## 2022-08-03 MED ORDER — REGADENOSON 0.4 MG/5ML IV SOLN
INTRAVENOUS | Status: AC
  Filled 2022-08-03: qty 5

## 2022-08-03 MED ORDER — TECHNETIUM TC 99M TETROFOSMIN IV KIT
10.1000 | PACK | Freq: Once | INTRAVENOUS | Status: AC | PRN
  Administered 2022-08-03: 10.1 via INTRAVENOUS

## 2022-08-03 MED ORDER — TECHNETIUM TC 99M TETROFOSMIN IV KIT
30.3000 | PACK | Freq: Once | INTRAVENOUS | Status: AC | PRN
  Administered 2022-08-03: 30.3 via INTRAVENOUS

## 2022-08-03 MED ORDER — SODIUM CHLORIDE FLUSH 0.9 % IV SOLN
INTRAVENOUS | Status: AC
  Administered 2022-08-03: 10 mL via INTRAVENOUS
  Filled 2022-08-03: qty 10

## 2022-08-03 NOTE — Telephone Encounter (Signed)
[place] of 07/24/2022 cCT:

## 2022-08-10 ENCOUNTER — Ambulatory Visit: Payer: Medicare Other | Attending: Internal Medicine | Admitting: Pharmacist

## 2022-08-10 ENCOUNTER — Other Ambulatory Visit (HOSPITAL_COMMUNITY): Payer: Self-pay

## 2022-08-10 ENCOUNTER — Encounter: Payer: Self-pay | Admitting: Pharmacist

## 2022-08-10 ENCOUNTER — Telehealth: Payer: Self-pay

## 2022-08-10 ENCOUNTER — Telehealth: Payer: Self-pay | Admitting: Pharmacist

## 2022-08-10 DIAGNOSIS — T466X5A Adverse effect of antihyperlipidemic and antiarteriosclerotic drugs, initial encounter: Secondary | ICD-10-CM | POA: Diagnosis not present

## 2022-08-10 DIAGNOSIS — I2510 Atherosclerotic heart disease of native coronary artery without angina pectoris: Secondary | ICD-10-CM

## 2022-08-10 DIAGNOSIS — G720 Drug-induced myopathy: Secondary | ICD-10-CM | POA: Diagnosis not present

## 2022-08-10 DIAGNOSIS — R931 Abnormal findings on diagnostic imaging of heart and coronary circulation: Secondary | ICD-10-CM

## 2022-08-10 DIAGNOSIS — E782 Mixed hyperlipidemia: Secondary | ICD-10-CM

## 2022-08-10 MED ORDER — REPATHA SURECLICK 140 MG/ML ~~LOC~~ SOAJ: 1.0000 mL | SUBCUTANEOUS | 0 refills | Status: DC

## 2022-08-10 NOTE — Telephone Encounter (Signed)
Please complete PA for Repatha sureclix 140mg  q 2 weeks

## 2022-08-10 NOTE — Telephone Encounter (Signed)
Pharmacy Patient Advocate Encounter  Prior Authorization for REPATHA has been approved.    PA# [id number] Effective dates: 08/10/22 through 02/09/23  Karie Soda, [place] Patient Advocate Specialist Direct Number: [phone] Fax: [phone]

## 2022-08-10 NOTE — Patient Instructions (Addendum)
It was nice meeting you today  We would like your LDL (bad cholesterol) to be less than 70  We would like to start you on a new medication called Repatha, which you would inject once every 2 times a week  I will complete the prior authorization for you and contact you when it is approved  Once you start the medication we will recheck your cholesterol in 2-3 months  Please call or message with any questions  Karren Cobble, PharmD, [place], [place], [place], [place], [place], [address] Phone: [phone], Fax: [phone]

## 2022-08-10 NOTE — Telephone Encounter (Signed)
Pharmacy Patient Advocate Encounter   Received notification from [place] that prior authorization for REPATHA is needed.    PA submitted on 08/10/22 Key [id number] Status is pending  Karie Soda, [place] Patient Advocate Specialist Direct Number: [phone] Fax: [phone]

## 2022-08-10 NOTE — Progress Notes (Signed)
Patient ID: Tyquan Eilers                 DOB: 02-27-1956                    MRN: [id number]     HPI: Jonathan Dyer is a 67 y.o. male patient referred to lipid clinic by Dr Domenic Polite. PMH is significant for HTN, CAD, elevated coronary calcium score, A Fib pending watchman implant, OSA, and HLD.  Patient also statin intolerant.  Patient presents today to discuss CAD. Has history of elevated cholesterol and father died of MI.  Had heart cath many years ago and was started on atorvastatin which helped until he developed myalgias. Switched to different agents and all caused myalgias.  Denies any chest pain or SOB. Went on a multiple mile bike ride this weekend.  Current Medications: N/A  Intolerances:  Rosuvastatin Pravastatin Atorvastatin Vytorin  Risk Factors:  CAD Coronary calcium score Family history  LDL goal: <70  Labs: TC 262, Trigs 134, HDL 48, LDL 190 (07/21/22)  Imaging: Coronary Calcium Score:   Left main: 109   Left anterior descending artery: 2409   Left circumflex artery: 687   Right coronary artery: 1967   Total: [id number]   Percentile: 99th for age, sex, and race matched control.  Past Medical History:  Diagnosis Date   Allergy    Arthritis    Atrial fibrillation ([place])    Diagnosed 09/2009, on flecainide   Back pain    Bilateral swelling of feet    Bradycardia    Event monitor 2010: HR down to 45, asymptomatic.   Cataract    Coronary atherosclerosis of native coronary artery    Nonobstructive and distal disease by cath 2005.    Essential hypertension    Hyperlipidemia    Joint pain    OSA (obstructive sleep apnea)    Osteoarthritis    Overweight    Prediabetes    Renal cyst    Ringing in ears    Sleep apnea    Vitamin D deficiency     Current Outpatient Medications on File Prior to Visit  Medication Sig Dispense Refill   meloxicam (MOBIC) 15 MG tablet Take 15 mg by mouth daily.     rosuvastatin (CRESTOR) 5 MG tablet Take 5 mg by  mouth daily.     amLODipine (NORVASC) 10 MG tablet TAKE 1 TABLET BY MOUTH DAILY 100 tablet 1   chlorthalidone (HYGROTON) 25 MG tablet TAKE ONE-HALF TABLET BY MOUTH  DAILY 50 tablet 2   Coenzyme Q10 (COQ10) 100 MG CAPS Take 1 capsule by mouth daily.     flecainide (TAMBOCOR) 50 MG tablet TAKE 1 TABLET BY MOUTH TWICE  DAILY 200 tablet 1   furosemide (LASIX) 40 MG tablet TAKE 1 TABLET BY MOUTH DAILY AS  NEEDED FOR FLUID 90 tablet 1   losartan (COZAAR) 100 MG tablet TAKE 1 TABLET BY MOUTH DAILY 100 tablet 1   Multiple Vitamin (MULTIVITAMIN) tablet Take 1 tablet by mouth daily.     omeprazole (PRILOSEC) 20 MG capsule TAKE 1 CAPSULE BY MOUTH DAILY AS NEEDED FOR ACID REFLUX,  INDIGESTION 90 capsule 1   potassium chloride (KLOR-CON) 10 MEQ tablet TAKE 1 TABLET BY MOUTH DAILY 100 tablet 2   tamsulosin (FLOMAX) 0.4 MG CAPS capsule Take 0.4 mg by mouth. Takes occasionally.     No current facility-administered medications on file prior to visit.    Allergies  Allergen Reactions  Clindamycin/Lincomycin Swelling and Other (See Comments)    Mycin drug family    Penicillin G Other (See Comments)   Penicillins Other (See Comments)    Has patient had a PCN reaction causing immediate rash, facial/tongue/throat swelling, SOB or lightheadedness with hypotension: unknown Has patient had a PCN reaction causing severe rash involving mucus membranes or skin necrosis: unknown Has patient had a PCN reaction that required hospitalization unknown Has patient had a PCN reaction occurring within the last 10 years: unknown If all of the above answers are "NO", then may proceed    Pneumococcal 13-Val Conj Vacc Swelling    Patient stated that he has severe swelling in his arm from vaccine and swollen up to his collar bone. Lasted about 2 days and did not feel well.     Assessment/Plan:  1. Hyperlipidemia - Patient LDL 190 which is well above goal of <70. Unfortunately is statin intolerant. Discussed lipid lowering  options including Repatha, Nexletol, and Leqvio. Recommended Repatha over Leqvio due to cost and over Nexletol due to lipid lowering and less risk of adverse effects.  Using [employer], educated patient on mechanism on action, storage, site selection, and possible adverse effects. Will complete PA and contact patient when approved. Patient requests sample which was dispensed and logged. Also gave copay card. Will complete lipid panel in 2-3 months.   Karren Cobble, PharmD, BCACP, [place], [place], [place], [place], [address] Phone: [phone], Fax: [phone]

## 2022-08-11 ENCOUNTER — Ambulatory Visit (INDEPENDENT_AMBULATORY_CARE_PROVIDER_SITE_OTHER): Payer: Medicare Other | Admitting: Family Medicine

## 2022-08-11 MED ORDER — REPATHA SURECLICK 140 MG/ML ~~LOC~~ SOAJ: 1.0000 mL | SUBCUTANEOUS | 11 refills | Status: DC

## 2022-08-11 NOTE — Addendum Note (Signed)
Addended by: Rollen Sox on: 08/11/2022 09:07 AM   Modules accepted: Orders

## 2022-08-12 ENCOUNTER — Telehealth: Payer: Self-pay

## 2022-08-12 ENCOUNTER — Other Ambulatory Visit: Payer: Self-pay

## 2022-08-12 DIAGNOSIS — I480 Paroxysmal atrial fibrillation: Secondary | ICD-10-CM

## 2022-08-12 NOTE — Telephone Encounter (Signed)
-----   Message from Vickie Epley, MD sent at 07/30/2022  9:33 PM EDT ----- CT shows very elevated coronary artery calcium score. Dr Domenic Polite is planning for a stress test. We will need to wait for the results of the stress test before proceeding with Watchman implant. Can we plan for a follow up phone visit with him after his stress test?  Lysbeth Galas T. Quentin Ore, MD, [facility], [facility] Cardiac Electrophysiology

## 2022-08-12 NOTE — Telephone Encounter (Signed)
Reviewed CT results with patient who verbalized understanding.   The patient has several bike races planned this summer and wishes to proceed with LAAO on 01/07/2023. Pre-procedure visit scheduled 12/14/2022. He was grateful for call and agreed with plan.    The patient stated he completed a 29 mile bike ride this weekend and since then, he has had some slight chest discomfort when exerting himself. He feels better with rest.  While his stress test showed adequate blood flow, informed the patient Dr. Domenic Polite will be notified. He has an appointment scheduled with Dr. Domenic Polite 5/17. S/S and ER precautions reviewed.

## 2022-08-13 NOTE — Telephone Encounter (Signed)
Contacted to follow up on symptoms. Denies current chest discomfort and says symptoms have improved. Denies SOB or dizziness. Advised to keep follow up as planned, continue monitoring symptoms and contact office if he has any more problems. Verbalized understanding of plan.

## 2022-08-17 ENCOUNTER — Other Ambulatory Visit: Payer: Self-pay

## 2022-08-17 MED ORDER — REPATHA SURECLICK 140 MG/ML ~~LOC~~ SOAJ
1.0000 mL | SUBCUTANEOUS | 11 refills | Status: DC
Start: 2022-08-17 — End: 2023-12-08

## 2022-08-17 NOTE — Addendum Note (Signed)
Addended by: Napolean Sia E on: 08/17/2022 09:18 AM   Modules accepted: Orders

## 2022-08-25 ENCOUNTER — Encounter (INDEPENDENT_AMBULATORY_CARE_PROVIDER_SITE_OTHER): Payer: Self-pay | Admitting: Family Medicine

## 2022-08-25 ENCOUNTER — Ambulatory Visit (INDEPENDENT_AMBULATORY_CARE_PROVIDER_SITE_OTHER): Payer: Medicare Other | Admitting: Family Medicine

## 2022-08-25 VITALS — BP 127/72 | HR 63 | Temp 97.4°F | Ht 75.0 in | Wt 273.0 lb

## 2022-08-25 DIAGNOSIS — E782 Mixed hyperlipidemia: Secondary | ICD-10-CM

## 2022-08-25 DIAGNOSIS — Z6834 Body mass index (BMI) 34.0-34.9, adult: Secondary | ICD-10-CM | POA: Diagnosis not present

## 2022-08-25 DIAGNOSIS — E669 Obesity, unspecified: Secondary | ICD-10-CM | POA: Diagnosis not present

## 2022-08-26 NOTE — Progress Notes (Unsigned)
Chief Complaint:   OBESITY Jonathan Dyer is here to discuss his progress with his obesity treatment plan along with follow-up of his obesity related diagnoses. Jonathan Dyer is on the Category 3 Plan and states he is following his eating plan approximately 0% of the time. Jonathan Dyer states he is doing 0 minutes 0 times per week.  Today's visit was #: 8 Starting weight: 274 lbs Starting date: 01/27/2022 Today's weight: 273 lbs Today's date: 08/25/2022 Total lbs lost to date: 1 Total lbs lost since last in-office visit: 1  Interim History: Jonathan Dyer continues to exercise, and riding his bike up to 50 miles at a time.  He would like to change to journaling, and he feels he will do better with this.  He eats fig cookies while riding but he may be getting more calories than what he is burning.  Subjective:   1. Hyperlipidemia, mixed Jonathan Dyer is unable to tolerate statin and he was changed to Repatha by his cardiologist.  He had a stress test done recently.  I discussed labs with the patient today.  Assessment/Plan:   1. Hyperlipidemia, mixed Jonathan Dyer's stress test results were explained to the patient.  His EF is 53% with good left ventricle function.  He is to continue to exercise, and will work on journaling to improve his cholesterol level.  2. BMI 34.0-34.9,adult  3. Obesity, Beginning BMI 34.25 Jonathan Dyer is currently in the action stage of change. As such, his goal is to continue with weight loss efforts. He has agreed to keeping a food journal and adhering to recommended goals of 1500-1700 calories and 100+ grams of protein daily.   Behavioral modification strategies: increasing lean protein intake, meal planning and cooking strategies, and keeping a strict food journal.  Camry has agreed to follow-up with our clinic in 3 to 4 weeks. He was informed of the importance of frequent follow-up visits to maximize his success with intensive lifestyle modifications for his multiple health conditions.   Objective:   Blood  pressure 127/72, pulse 63, temperature (!) 97.4 F (36.3 C), height  (1.905 m), weight 273 lb (123.8 kg), SpO2 96 %. Body mass index is 34.12 kg/m.  Lab Results  Component Value Date   CREATININE 1.35 (H) 07/21/2022   BUN 21 07/21/2022   NA 144 07/21/2022   K 4.4 07/21/2022   CL 104 07/21/2022   CO2 23 07/21/2022   Lab Results  Component Value Date   ALT 13 07/21/2022   AST 13 07/21/2022   ALKPHOS 95 07/21/2022   BILITOT 0.4 07/21/2022   Lab Results  Component Value Date   HGBA1C 5.7 (H) 07/21/2022   HGBA1C 6.0 (H) 01/27/2022   HGBA1C 5.5 10/03/2019   HGBA1C 5.7 10/21/2018   Lab Results  Component Value Date   INSULIN 11.5 07/21/2022   INSULIN 12.6 01/27/2022   Lab Results  Component Value Date   TSH 2.560 07/21/2022   Lab Results  Component Value Date   CHOL 262 (H) 07/21/2022   HDL 48 07/21/2022   LDLCALC 190 (H) 07/21/2022   TRIG 134 07/21/2022   CHOLHDL 6 04/01/2021   Lab Results  Component Value Date   VD25OH 43.3 07/21/2022   VD25OH 39.9 01/27/2022   VD25OH 30.49 04/01/2021   Lab Results  Component Value Date   WBC 5.5 07/21/2022   HGB 15.7 07/21/2022   HCT 46.5 07/21/2022   MCV 90 07/21/2022   PLT 253 07/21/2022   Lab Results  Component Value Date  IRON 86 07/21/2022   TIBC 280 07/21/2022   FERRITIN 228 07/21/2022   Attestation Statements:   Reviewed by clinician on day of visit: allergies, medications, problem list, medical history, surgical history, family history, social history, and previous encounter notes.  Time spent on visit including pre-visit chart review and post-visit care and charting was 30 minutes.   I, Burt Knack, am acting as transcriptionist for Quillian Quince, MD.  I have reviewed the above documentation for accuracy and completeness, and I agree with the above. -  Quillian Quince, MD

## 2022-08-28 DIAGNOSIS — M1711 Unilateral primary osteoarthritis, right knee: Secondary | ICD-10-CM | POA: Diagnosis not present

## 2022-09-03 ENCOUNTER — Telehealth: Payer: Self-pay | Admitting: Cardiology

## 2022-09-03 ENCOUNTER — Other Ambulatory Visit: Payer: Self-pay | Admitting: Cardiology

## 2022-09-03 NOTE — Telephone Encounter (Signed)
Pt came into the [place] office stating that the Repatha card that he received to use at the pharmacy he is unable to use it. Was told by the pharmacy that the copay cards were hacked.  Please give pt a call [phone]

## 2022-09-04 ENCOUNTER — Other Ambulatory Visit (HOSPITAL_COMMUNITY): Payer: Self-pay

## 2022-09-04 ENCOUNTER — Other Ambulatory Visit: Payer: Self-pay | Admitting: Cardiology

## 2022-09-07 ENCOUNTER — Encounter: Payer: Self-pay | Admitting: Pharmacist

## 2022-09-08 ENCOUNTER — Ambulatory Visit (INDEPENDENT_AMBULATORY_CARE_PROVIDER_SITE_OTHER): Payer: Medicare Other | Admitting: Physician Assistant

## 2022-09-09 NOTE — Telephone Encounter (Signed)
Sent patient mychart message

## 2022-09-24 NOTE — Progress Notes (Signed)
Cardiology Office Note  Date: 09/25/2022   ID: Jonathan Dyer, DOB Sep 28, 1955, MRN [id number]  History of Present Illness: Jonathan Dyer is a 67 y.o. male last seen in March.  He is here for a follow-up visit.  States that he has not been riding his bike as much due to the weather, but when he does get out there he has not had any angina or unusual shortness of breath.  A few weeks ago he rode for 3 days in a row, total of 80 miles.  [place] in [place] few [place] for a 57 mile gravel ride.  Still no sense of palpitations, we went over his medications and he reports no intolerances including recent initiation of Repatha.  ECG today shows sinus bradycardia with LVH, QRS 124 ms.  He has gained some weight recently, states that he has not been following his diet as carefully.  We discussed this today.  Physical Exam: VS:  BP 130/76   Pulse (!) 51   Ht 6\' 3"  (1.905 m)   Wt 285 lb 12.8 oz (129.6 kg)   SpO2 97%   BMI 35.72 kg/m , BMI Body mass index is 35.72 kg/m.  Wt Readings from Last 3 Encounters:  09/25/22 285 lb 12.8 oz (129.6 kg)  08/25/22 273 lb (123.8 kg)  07/30/22 279 lb 9.6 oz (126.8 kg)    General: Patient appears comfortable at rest. HEENT: Conjunctiva and lids normal. Neck: Supple, no elevated JVP or carotid bruits. Lungs: Clear to auscultation, nonlabored breathing at rest. Cardiac: Regular rate and rhythm, no S3 or significant systolic murmur. Extremities: No pitting edema.  ECG:  An ECG dated 08/03/2022 was personally reviewed today and demonstrated:  Sinus rhythm with R' in lead V1, QRS duration 110 ms.  Labwork: 07/21/2022: ALT 13; AST 13; BUN 21; Creatinine, Ser 1.35; Hemoglobin 15.7; Magnesium 2.1; Platelets 253; Potassium 4.4; Sodium 144; TSH 2.560     Component Value Date/Time   CHOL 262 (H) 07/21/2022 0802   TRIG 134 07/21/2022 0802   HDL 48 07/21/2022 0802   CHOLHDL 6 04/01/2021 0813   VLDL 24.4 04/01/2021 0813    LDLCALC 190 (H) 07/21/2022 0802   LDLCALC 131 (H) 10/03/2019 0926   Other Studies Reviewed Today:  Exercise Myoview 08/03/2022:   Findings are consistent with no ischemia. The study is low risk.   No ST deviation was noted. The ECG was negative for ischemia.  Low risk Duke treadmill score of 9.   LV perfusion is equivocal.  Small, mild intensity, mid to apical inferolateral defect that is predominantly fixed with partial reversibility suggestive of variable soft tissue attenuation artifact.  Importantly, no large ischemic territories are noted.   Left ventricular function is normal. Nuclear stress EF: 53 %.   Low risk study with Duke treadmill score of 9, no exercise-induced arrhythmias, and perfusion imaging most consistent with variable soft tissue attenuation.  No large ischemic territories are noted.  LVEF 53%.  Assessment and Plan:  1.  Significantly elevated coronary calcium score of 5250, LAD and RCA distribution predominantly as of coronary CTA in March.  Subsequent exercise Myoview was negative for ischemia and LVEF 53%.  He does not describe any definite angina and plan for now is to continue medical therapy and observation.  ECG reviewed.  He is now on Repatha with goal of better lipid control and otherwise remains on Norvasc and Cozaar for control of blood pressure.  2.  Paroxysmal to persistent atrial fibrillation with CHA2DS2-VASc score of 4.  He plans on Watchman implantation per Dr. Lalla Brothers in September.  He is not anticoagulated at this time.  3.  Mixed hyperlipidemia with history of statin myalgias.  He has been evaluated in the lipid clinic and has started on Repatha.  Baseline LDL was 190 in March.  4.  Essential hypertension.  No changes made today.  Continue Cozaar and Norvasc.  He uses chlorthalidone with potassium supplement only as needed, mainly for ankle swelling.  Disposition:  Follow up  August.  Signed, Jonelle Sidle, M.D., F.A.C.C. [facility]  at [facility]

## 2022-09-25 ENCOUNTER — Encounter: Payer: Self-pay | Admitting: Cardiology

## 2022-09-25 ENCOUNTER — Ambulatory Visit: Payer: Medicare Other | Attending: Cardiology | Admitting: Cardiology

## 2022-09-25 VITALS — BP 130/76 | HR 51 | Ht 75.0 in | Wt 285.8 lb

## 2022-09-25 DIAGNOSIS — I480 Paroxysmal atrial fibrillation: Secondary | ICD-10-CM

## 2022-09-25 DIAGNOSIS — R931 Abnormal findings on diagnostic imaging of heart and coronary circulation: Secondary | ICD-10-CM | POA: Diagnosis not present

## 2022-09-25 DIAGNOSIS — Z789 Other specified health status: Secondary | ICD-10-CM | POA: Diagnosis not present

## 2022-09-25 DIAGNOSIS — I10 Essential (primary) hypertension: Secondary | ICD-10-CM | POA: Diagnosis not present

## 2022-09-25 DIAGNOSIS — E782 Mixed hyperlipidemia: Secondary | ICD-10-CM

## 2022-09-25 MED ORDER — CHLORTHALIDONE 25 MG PO TABS: 12.5000 mg | ORAL_TABLET | Freq: Every day | ORAL | 2 refills | Status: DC | PRN

## 2022-09-25 MED ORDER — POTASSIUM CHLORIDE ER 10 MEQ PO TBCR: 10.0000 meq | EXTENDED_RELEASE_TABLET | Freq: Every day | ORAL | 2 refills | Status: DC | PRN

## 2022-09-25 NOTE — Patient Instructions (Addendum)
Medication Instructions:   Your physician recommends that you continue on your current medications as directed. Please refer to the Current Medication list given to you today.  Labwork:  none  Testing/Procedures:  none  Follow-Up:  Your physician recommends that you schedule a follow-up appointment in: 3 months.  Any Other Special Instructions Will Be Listed Below (If Applicable).  If you need a refill on your cardiac medications before your next appointment, please call your pharmacy. 

## 2022-09-29 ENCOUNTER — Encounter (INDEPENDENT_AMBULATORY_CARE_PROVIDER_SITE_OTHER): Payer: Self-pay | Admitting: Family Medicine

## 2022-09-29 ENCOUNTER — Ambulatory Visit (INDEPENDENT_AMBULATORY_CARE_PROVIDER_SITE_OTHER): Payer: Medicare Other | Admitting: Family Medicine

## 2022-09-29 VITALS — BP 114/62 | HR 55 | Temp 97.9°F | Ht 75.0 in | Wt 281.0 lb

## 2022-09-29 DIAGNOSIS — Z6835 Body mass index (BMI) 35.0-35.9, adult: Secondary | ICD-10-CM | POA: Diagnosis not present

## 2022-09-29 DIAGNOSIS — E669 Obesity, unspecified: Secondary | ICD-10-CM

## 2022-09-29 DIAGNOSIS — I10 Essential (primary) hypertension: Secondary | ICD-10-CM

## 2022-09-30 NOTE — Progress Notes (Signed)
Chief Complaint:   OBESITY Jonathan Dyer is here to discuss his progress with his obesity treatment plan along with follow-up of his obesity related diagnoses. Jonathan Dyer is on keeping a food journal and adhering to recommended goals of 1500-1700 calories and 100+ grams of protein and states he is following his eating plan approximately 0% of the time. Jonathan Dyer states he is bicycle riding for 96 minutes 3 times per week.  Today's visit was #: 9 Starting weight: 274 lbs Starting date: 01/27/2022 Today's weight: 281 lbs Today's date: 09/29/2022 Total lbs lost to date: 0 Total lbs lost since last in-office visit: 0  Interim History: Jonathan Dyer has not been  able to concentrate on his weight loss for a variety of reasons. He will be traveling more in the near future. He struggles with meal planning and misses breakfast at times.   Subjective:   1. Essential hypertension Jonathan Dyer's blood pressure is controlled today on his medications. He has no signs of hypotension, even while cycling. He continues to work on his weight loss.   Assessment/Plan:   1. Essential hypertension Jonathan Dyer will continue Norvasc 10 mg and chlorthalidone 25 mg. He will continue with his diet and exercise, and we will monitor his progress.   2. BMI 35.0-35.9,adult  3. Obesity, Beginning BMI 34.25 Jonathan Dyer is currently in the action stage of change. As such, his goal is to continue with weight loss efforts. He has agreed to the Category 4 Plan and keeping a food journal and adhering to recommended goals of 300-500 calories and 30+ grams of protein daily.   Breakfast options were discussed in depth, and formulated strategies to make breakfast filling and easy while meeting his nutritional requirements.   Exercise goals: As is.   Behavioral modification strategies: increasing lean protein intake, increasing water intake, and travel eating strategies.  Jonathan Dyer has agreed to follow-up with our clinic in 3 to 4 weeks. He was informed of the importance  of frequent follow-up visits to maximize his success with intensive lifestyle modifications for his multiple health conditions.   Objective:   Blood pressure 114/62, pulse (!) 55, temperature 97.9 F (36.6 C), height 6\' 3"  (1.905 m), weight 281 lb (127.5 kg), SpO2 95 %. Body mass index is 35.12 kg/m.  Lab Results  Component Value Date   CREATININE 1.35 (H) 07/21/2022   BUN 21 07/21/2022   NA 144 07/21/2022   K 4.4 07/21/2022   CL 104 07/21/2022   CO2 23 07/21/2022   Lab Results  Component Value Date   ALT 13 07/21/2022   AST 13 07/21/2022   ALKPHOS 95 07/21/2022   BILITOT 0.4 07/21/2022   Lab Results  Component Value Date   HGBA1C 5.7 (H) 07/21/2022   HGBA1C 6.0 (H) 01/27/2022   HGBA1C 5.5 10/03/2019   HGBA1C 5.7 10/21/2018   Lab Results  Component Value Date   INSULIN 11.5 07/21/2022   INSULIN 12.6 01/27/2022   Lab Results  Component Value Date   TSH 2.560 07/21/2022   Lab Results  Component Value Date   CHOL 262 (H) 07/21/2022   HDL 48 07/21/2022   LDLCALC 190 (H) 07/21/2022   TRIG 134 07/21/2022   CHOLHDL 6 04/01/2021   Lab Results  Component Value Date   VD25OH 43.3 07/21/2022   VD25OH 39.9 01/27/2022   VD25OH 30.49 04/01/2021   Lab Results  Component Value Date   WBC 5.5 07/21/2022   HGB 15.7 07/21/2022   HCT 46.5 07/21/2022   MCV 90  07/21/2022   PLT 253 07/21/2022   Lab Results  Component Value Date   IRON 86 07/21/2022   TIBC 280 07/21/2022   FERRITIN 228 07/21/2022   Attestation Statements:   Reviewed by clinician on day of visit: allergies, medications, problem list, medical history, surgical history, family history, social history, and previous encounter notes.  Time spent on visit including pre-visit chart review and post-visit care and charting was 32 minutes.   I, Burt Knack, am acting as transcriptionist for Quillian Quince, MD.  I have reviewed the above documentation for accuracy and completeness, and I agree with the  above. -  Quillian Quince, MD

## 2022-10-02 ENCOUNTER — Ambulatory Visit: Payer: Medicare Other | Admitting: Internal Medicine

## 2022-10-05 ENCOUNTER — Other Ambulatory Visit: Payer: Self-pay | Admitting: Cardiology

## 2022-10-14 ENCOUNTER — Ambulatory Visit (INDEPENDENT_AMBULATORY_CARE_PROVIDER_SITE_OTHER): Payer: Medicare Other | Admitting: Family Medicine

## 2022-10-14 ENCOUNTER — Encounter (INDEPENDENT_AMBULATORY_CARE_PROVIDER_SITE_OTHER): Payer: Self-pay | Admitting: Family Medicine

## 2022-10-14 VITALS — BP 131/72 | HR 54 | Temp 98.6°F | Ht 75.0 in | Wt 282.0 lb

## 2022-10-14 DIAGNOSIS — E669 Obesity, unspecified: Secondary | ICD-10-CM | POA: Diagnosis not present

## 2022-10-14 DIAGNOSIS — Z6835 Body mass index (BMI) 35.0-35.9, adult: Secondary | ICD-10-CM | POA: Diagnosis not present

## 2022-10-14 DIAGNOSIS — R600 Localized edema: Secondary | ICD-10-CM

## 2022-10-14 DIAGNOSIS — I10 Essential (primary) hypertension: Secondary | ICD-10-CM | POA: Diagnosis not present

## 2022-10-14 NOTE — Progress Notes (Signed)
[facility]  Office: [phone]  /  Fax: [phone]  WEIGHT SUMMARY AND BIOMETRICS  Anthropometric Measurements Height: 6\' 3"  (1.905 m) Weight: 282 lb (127.9 kg) BMI (Calculated): 35.25 Weight at Last Visit: 281lb Weight Lost Since Last Visit: 0lb Weight Gained Since Last Visit: 1lb   Body Composition  Body Fat %: 35.3 % Fat Mass (lbs): 99.8 lbs Muscle Mass (lbs): 174 lbs Total Body Water (lbs): 132.8 lbs Visceral Fat Rating : 22   Other Clinical Data Fasting: no Labs: no Today's Visit #: 10  Discussed the use of AI scribe software for clinical note transcription with the patient, who gave verbal consent to proceed.  Chief Complaint: OBESITY  Jonathan Dyer is here to discuss his progress with his obesity treatment plan. He is on the the Category 3 Plan and states he is following his eating plan approximately 50 % of the time. He states he is exercising 60 minutes 3 times per week.   History of Present Illness   The patient, an active individual with a history of hypertension, recently participated in a long-distance cycling event. He reported feeling tired but not energy-depleted during the event, attributing this to a new eating and hydration strategy. He set a timer to remind himself to drink every 10 minutes and eat between 20 and 35 minutes. Post-event, the patient reported feeling tired and experiencing a sensation akin to a pinched nerve in the back.  The patient also reported swelling in the feet and legs during the drive home from the event, which he managed with a dose of furosemide. He noted that he had been experiencing issues with foot swelling even before the event and had taken furosemide for a few days prior.  In terms of nutrition, the patient has been making efforts to improve his diet. He reported success with breakfast, with adherence to a healthier diet about 95% of the time. However, he admitted to struggling with maintaining a healthy diet for lunch and dinner, with  adherence rates of about 50% and 40% respectively. The patient expressed a desire to cut out as much sugar as possible from his diet and was seeking advice on how to do so.  The patient is on a regimen of chlorthalidone, taken as needed, and potassium supplements. He reported seldom needing the chlorthalidone. He also reported a recent visit to another doctor who informed him that chlorthalidone also acts as a diuretic, similar to furosemide.  The patient has more cycling events coming up and is keen on maintaining his energy levels and managing post-event recovery better. He is also focused on improving his diet, particularly for lunch and dinner, and reducing his sugar intake. He is aware of the need to manage fluid retention and dehydration, particularly during his cycling events.          PHYSICAL EXAM:  Blood pressure 131/72, pulse (!) 54, temperature 98.6 F (37 C), height 6\' 3"  (1.905 m), weight 282 lb (127.9 kg), SpO2 96 %. Body mass index is 35.25 kg/m.  DIAGNOSTIC DATA REVIEWED:  BMET    Component Value Date/Time   NA 144 07/21/2022 0802   K 4.4 07/21/2022 0802   CL 104 07/21/2022 0802   CO2 23 07/21/2022 0802   GLUCOSE 91 07/21/2022 0802   GLUCOSE 86 04/01/2021 0813   BUN 21 07/21/2022 0802   CREATININE 1.35 (H) 07/21/2022 0802   CREATININE 1.34 (H) 10/31/2020 0946   CALCIUM 9.5 07/21/2022 0802   GFRNONAA 60 (L) 02/04/2021 0814   GFRNONAA 54 (  L) 10/31/2020 0946   GFRAA 63 10/31/2020 0946   Lab Results  Component Value Date   HGBA1C 5.7 (H) 07/21/2022   HGBA1C 5.7 10/21/2018   Lab Results  Component Value Date   INSULIN 11.5 07/21/2022   INSULIN 12.6 01/27/2022   Lab Results  Component Value Date   TSH 2.560 07/21/2022   CBC    Component Value Date/Time   WBC 5.5 07/21/2022 0802   WBC 6.1 06/28/2018 0909   RBC 5.15 07/21/2022 0802   RBC 4.92 10/21/2018 0000   HGB 15.7 07/21/2022 0802   HCT 46.5 07/21/2022 0802   PLT 253 07/21/2022 0802   MCV 90  07/21/2022 0802   MCH 30.5 07/21/2022 0802   MCH 31.0 06/28/2018 0909   MCHC 33.8 07/21/2022 0802   MCHC 33.9 06/28/2018 0909   RDW 12.6 07/21/2022 0802   Iron Studies    Component Value Date/Time   IRON 86 07/21/2022 0802   TIBC 280 07/21/2022 0802   FERRITIN 228 07/21/2022 0802   IRONPCTSAT 31 07/21/2022 0802   Lipid Panel     Component Value Date/Time   CHOL 262 (H) 07/21/2022 0802   TRIG 134 07/21/2022 0802   HDL 48 07/21/2022 0802   CHOLHDL 6 04/01/2021 0813   VLDL 24.4 04/01/2021 0813   LDLCALC 190 (H) 07/21/2022 0802   LDLCALC 131 (H) 10/03/2019 0926   Hepatic Function Panel     Component Value Date/Time   PROT 7.1 07/21/2022 0802   ALBUMIN 4.5 07/21/2022 0802   AST 13 07/21/2022 0802   ALT 13 07/21/2022 0802   ALKPHOS 95 07/21/2022 0802   BILITOT 0.4 07/21/2022 0802      Component Value Date/Time   TSH 2.560 07/21/2022 0802   Nutritional Lab Results  Component Value Date   VD25OH 43.3 07/21/2022   VD25OH 39.9 01/27/2022   VD25OH 30.49 04/01/2021     Assessment and Plan    Post-Exercise Recovery: Successful completion of a 55-mile bike ride with adequate energy levels. Experiencing fatigue and mild discomfort in the back. Discussed the importance of hydration, antioxidant-rich foods, and appropriate carbohydrate intake for recovery. -Continue hydration and antioxidant-rich diet. -Consider incorporating more complex carbohydrates into diet.  Edema: Reports of swelling in feet and legs, particularly after prolonged sitting during travel. Currently taking Lasix and Chlorthalidone as needed. -Continue Lasix and Chlorthalidone as needed. -Ensure adequate hydration, particularly when taking diuretics.  Nutrition and Weight Management: Progress in meal timing and portion control, particularly for breakfast. Struggling with consistency for lunch and dinner. Discussed strategies for improving food choices and portion control. -Continue efforts to improve meal  timing and portion control. -Bring lunch from home to avoid unhealthy choices. -Avoid bringing unhealthy foods into the home. -Consider reducing intake of simple carbohydrates and increasing intake of complex carbohydrates.     HTN: Blood Pressure controlled, continue diet, exercise and current medication regime.    I have personally spent 40 minutes total time today in preparation, patient care, and documentation for this visit, including the following: review of clinical lab tests; review of medical tests/procedures/services.    He was informed of the importance of frequent follow up visits to maximize his success with intensive lifestyle modifications for his multiple health conditions. Return in about 4 weeks (around 11/11/2022).   Quillian Quince, MD

## 2022-10-19 ENCOUNTER — Ambulatory Visit (INDEPENDENT_AMBULATORY_CARE_PROVIDER_SITE_OTHER): Payer: Medicare Other | Admitting: Family Medicine

## 2022-10-19 ENCOUNTER — Encounter: Payer: Self-pay | Admitting: Family Medicine

## 2022-10-19 VITALS — BP 128/64 | HR 54 | Temp 97.7°F | Ht 75.0 in | Wt 280.6 lb

## 2022-10-19 DIAGNOSIS — Z0000 Encounter for general adult medical examination without abnormal findings: Secondary | ICD-10-CM | POA: Diagnosis not present

## 2022-10-19 NOTE — Patient Instructions (Signed)
Check with gastroenterology to determine repeat timing of colonoscopy. I recommend updated COVID booster with flu vaccine this fall.  You also have the option of the RSV vaccine, see information below, let me know if there are questions.  Shingles vaccine is also recommended, that can be given at the pharmacy but let me know if there are questions.  No med changes today, keep follow-up with your specialist as planned.  Follow-up with me in 6 months, can recheck meds and determine if any blood work needed at that time.  Take care.  There is a recommendation for RSV vaccine for patients over age 32.   Typically would recommend the RSV vaccine as most important for patients over age 11 that have comorbidities that put them at increased risk for severe disease (heart disease such as congestive heart failure, coronary artery disease, lung disease such as asthma or COPD, kidney disease, liver disease, diabetes, chronic or progressive neurologic or muscular conditions, immunosuppressed, or being frail or of advanced age).  For others that do not have these risk factors, there still is some benefit from vaccination since age is one of the main risk factors for developing severe disease however baseline risk of developing severe disease and requiring hospitalization is likely to be lower compared to those that have comorbidities in addition to age.   CDC does have some information as well: [employer]  Preventive Care 53 Years and Older, Male Preventive care refers to lifestyle choices and visits with your health care provider that can promote health and wellness. Preventive care visits are also called wellness exams. What can I expect for my preventive care visit? Counseling During your preventive care visit, your health care provider may ask about your: Medical history, including: Past medical problems. Family medical history. History of falls. Current  health, including: Emotional well-being. Home life and relationship well-being. Sexual activity. Memory and ability to understand (cognition). Lifestyle, including: Alcohol, nicotine or tobacco, and drug use. Access to firearms. Diet, exercise, and sleep habits. Work and work Astronomer. Sunscreen use. Safety issues such as seatbelt and bike helmet use. Physical exam Your health care provider will check your: Height and weight. These may be used to calculate your BMI (body mass index). BMI is a measurement that tells if you are at a healthy weight. Waist circumference. This measures the distance around your waistline. This measurement also tells if you are at a healthy weight and may help predict your risk of certain diseases, such as type 2 diabetes and high blood pressure. Heart rate and blood pressure. Body temperature. Skin for abnormal spots. What immunizations do I need?  Vaccines are usually given at various ages, according to a schedule. Your health care provider will recommend vaccines for you based on your age, medical history, and lifestyle or other factors, such as travel or where you work. What tests do I need? Screening Your health care provider may recommend screening tests for certain conditions. This may include: Lipid and cholesterol levels. Diabetes screening. This is done by checking your blood sugar (glucose) after you have not eaten for a while (fasting). Hepatitis C test. Hepatitis B test. HIV (human immunodeficiency virus) test. STI (sexually transmitted infection) testing, if you are at risk. Lung cancer screening. Colorectal cancer screening. Prostate cancer screening. Abdominal aortic aneurysm (AAA) screening. You may need this if you are a current or former smoker. Talk with your health care provider about your test results, treatment options, and if necessary, the need for more tests. Follow  these instructions at home: Eating and drinking  Eat a  diet that includes fresh fruits and vegetables, whole grains, lean protein, and low-fat dairy products. Limit your intake of foods with high amounts of sugar, saturated fats, and salt. Take vitamin and mineral supplements as recommended by your health care provider. Do not drink alcohol if your health care provider tells you not to drink. If you drink alcohol: Limit how much you have to 0-2 drinks a day. Know how much alcohol is in your drink. In the [place] one drink equals one 12 oz bottle of beer (355 mL), one 5 oz glass of wine (148 mL), or one 1 oz glass of hard liquor (44 mL). Lifestyle Brush your teeth every morning and night with fluoride toothpaste. Floss one time each day. Exercise for at least 30 minutes 5 or more days each week. Do not use any products that contain nicotine or tobacco. These products include cigarettes, chewing tobacco, and vaping devices, such as e-cigarettes. If you need help quitting, ask your health care provider. Do not use drugs. If you are sexually active, practice safe sex. Use a condom or other form of protection to prevent STIs. Take aspirin only as told by your health care provider. Make sure that you understand how much to take and what form to take. Work with your health care provider to find out whether it is safe and beneficial for you to take aspirin daily. Ask your health care provider if you need to take a cholesterol-lowering medicine (statin). Find healthy ways to manage stress, such as: Meditation, yoga, or listening to music. Journaling. Talking to a trusted person. Spending time with friends and family. Safety Always wear your seat belt while driving or riding in a vehicle. Do not drive: If you have been drinking alcohol. Do not ride with someone who has been drinking. When you are tired or distracted. While texting. If you have been using any mind-altering substances or drugs. Wear a helmet and other protective equipment during sports  activities. If you have firearms in your house, make sure you follow all gun safety procedures. Minimize exposure to UV radiation to reduce your risk of skin cancer. What's next? Visit your health care provider once a year for an annual wellness visit. Ask your health care provider how often you should have your eyes and teeth checked. Stay up to date on all vaccines. This information is not intended to replace advice given to you by your health care provider. Make sure you discuss any questions you have with your health care provider. Document Revised: 10/23/2020 Document Reviewed: 10/23/2020 Elsevier Patient Education  2024 [employer].

## 2022-10-19 NOTE — Progress Notes (Signed)
Subjective:  [name], male    DOB: 1955/10/26  Age: 67 y.o. MRN: [id number]  CC:  Chief Complaint  Patient presents with   Annual Exam    HPI. Jonathan Dyer presents for Annual Exam  Cardiac History of CAD, atrial fibrillation, and bradycardia treated with flecainide.  Not on anticoagulation due to possible fall/injury/bleed risks.  Hypertension treated with amlodipine 10 mg daily, losartan 100 mg daily, chlorthalidone half tablet as needed (every few weeks), rare need for furosemide as needed (about once per month with potassium).  prior statin myopathy.  Treated with Repatha by cardiology, Dr. Diona Browner.  Electrophysiology Dr. Lalla Brothers, plan for left atrial appendage occlusion September 26. BP Readings from Last 3 Encounters:  10/19/22 128/64  10/14/22 131/72  09/29/22 114/62   Prostate cancer Followed by urology, Dr. Marlou Porch with active surveillance, PSA monitoring every 6 months, appt in July. No new urinary symptoms.   Prediabetes: Diet/exercise approach.  Followed by weight management as above.  Recent labs noted from March.  Deferred labs today.  Lab Results  Component Value Date   HGBA1C 5.7 (H) 07/21/2022   Wt Readings from Last 3 Encounters:  10/19/22 280 lb 9.6 oz (127.3 kg)  10/14/22 282 lb (127.9 kg)  09/29/22 281 lb (127.5 kg)    Obesity Under the care of healthy weight and wellness, Dr. Dalbert Garnet, last visit June 5.     10/19/2022    8:33 AM 07/03/2022    8:10 AM 04/06/2022   11:03 AM 01/27/2022    9:16 AM 07/21/2021   12:54 PM  Depression screen PHQ 2/9  Decreased Interest 0 0 0 0 0  Down, Depressed, Hopeless 0 0 0 0 0  PHQ - 2 Score 0 0 0 0 0  Altered sleeping 0  0 0 0  Tired, decreased energy 0  0 1 0  Change in appetite 0  0 1 0  Feeling bad or failure about yourself  0  0 0 0  Trouble concentrating 0  0 1 0  Moving slowly or fidgety/restless 0  0 0 0  Suicidal thoughts 0  0 0 0  PHQ-9 Score 0  0 3 0  Difficult doing  work/chores Not difficult at all   Somewhat difficult Not difficult at all    Health Maintenance  Topic Date Due   Zoster Vaccines- Shingrix (1 of 2) Never done   Medicare Annual Wellness (AWV)  07/10/2016   COVID-19 Vaccine (3 - Moderna risk series) 09/20/2019   INFLUENZA VACCINE  12/10/2022   DTaP/Tdap/Td (2 - Td or Tdap) 01/08/2025   Pneumonia Vaccine 30+ Years old (3 of 3 - PPSV23 or PCV20) 08/07/2025   Colonoscopy  01/05/2028   Hepatitis C Screening  Completed   HPV VACCINES  Aged Out  Colonoscopy in 12/2017. Mucosal ulceration, Dr. Darrick Penna. Unknown repeat interval. No with Dr. Montez Morita.  Prostate CA - as above.   Immunization History  Administered Date(s) Administered   Fluad Quad(high Dose 65+) 03/27/2020, 03/31/2021   Influenza Inj Mdck Quad With Preservative 02/25/2016   Influenza,inj,Quad PF,6+ Mos 02/14/2015   Influenza-Unspecified 05/09/2012, 01/24/2013, 02/25/2017, 03/02/2017, 05/24/2018, 03/03/2019   Moderna Sars-Covid-2 Vaccination 07/20/2019, 08/23/2019   PPD Test 02/05/2014   Pneumococcal Conjugate-13 08/07/2020   Pneumococcal Polysaccharide-23 02/25/2016   Tdap 01/09/2015  Covid booster - defers Shingrix - recommended RSV discussed at pharmacy.   No results found. Last optho visit in February, early cataracts.   Dental: has dentist, due - 1  year - will schedule.   Alcohol: none  Tobacco: none  Exercise: riding bike 3 times per week. [place] race last week - 12 mi. Usually 20-30 miles.    History Patient Active Problem List   Diagnosis Date Noted   Bilateral Lower extremity edema 10/14/2022   BMI 35.0-35.9,adult 10/14/2022   Agatston coronary artery calcium score greater than 400 08/10/2022   Statin myopathy 08/10/2022   Elevated serum creatinine 07/21/2022   Myalgia 07/21/2022   BMI 34.0-34.9,adult 07/21/2022   Prostate cancer (HCC) 06/16/2022   Essential hypertension 06/16/2022   BMI 33.0-33.9,adult 06/16/2022   Obesity, Beginning BMI 34.25  06/16/2022   Prediabetes 03/03/2022   Other hyperlipidemia 03/03/2022   Vitamin D deficiency 03/03/2022   Class 1 obesity with serious comorbidity and body mass index (BMI) of 34.0 to 34.9 in adult 03/03/2022   Referred otalgia of right ear 05/23/2021   Presbycusis of both ears 05/23/2021   Pain due to dental caries 05/23/2021   Impacted cerumen of left ear 05/23/2021   Arthritis of carpometacarpal [facility]) joint of left thumb 02/05/2020   Gastroesophageal reflux disease without esophagitis 04/17/2019   Localized, primary osteoarthritis of hand 09/06/2018   Trigger finger of right hand 09/06/2018   Osteoarthritis of carpometacarpal [facility]) joint of thumb 09/06/2018   Renal cyst 07/04/2018   Colonic mass 01/03/2018   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   Screen for colon cancer 12/29/2017   Arthralgia of right foot 08/17/2017   Degeneration of lumbar intervertebral disc 08/04/2017   Osteoarthritis of right knee 07/20/2017   Spinal stenosis 07/20/2017   Hypertensive disorder 01/01/2016   Hx of adenomatous colonic polyps 03/01/2015   Pain in joint, shoulder region 01/26/2012   Impingement syndrome of left shoulder 01/26/2012   Muscle weakness (generalized) 01/26/2012   Hyperlipidemia, mixed 10/11/2009   Obstructive sleep apnea 10/11/2009   PAROXYSMAL ATRIAL FIBRILLATION 10/11/2009   CAD (coronary artery disease), native coronary artery 08/21/2009   Benign essential hypertension 11/09/2008   Past Medical History:  Diagnosis Date   Allergy    Arthritis    Atrial fibrillation (HCC)    Diagnosed 09/2009, on flecainide   Back pain    Bilateral swelling of feet    Bradycardia    Event monitor 2010: HR down to 45, asymptomatic.   Cataract    Coronary atherosclerosis of native coronary artery    Nonobstructive and distal disease by cath 2005.    Essential hypertension    Hyperlipidemia    Joint pain    OSA (obstructive sleep apnea)    Osteoarthritis    Overweight    Prediabetes    Renal cyst     Ringing in ears    Sleep apnea    Vitamin D deficiency    Past Surgical History:  Procedure Laterality Date   BIOPSY  01/04/2018   Procedure: BIOPSY;  Surgeon: West Bali, MD;  [facility]: [facility];  Service: Endoscopy;;  ascending colon    CARDIAC CATHETERIZATION  2005   COLONOSCOPY  05/2006   SLF: 3 mm sigmoid: Tubular adenoma removed.   COLONOSCOPY N/A 05/06/2016   Dr. Darrick Penna: two sessile polyps in mid transverse colon and hepatic flexure (tubular adenomas). ext/int hemorrhoids. tortuous left colon.    COLONOSCOPY WITH PROPOFOL N/A 01/04/2018   Procedure: COLONOSCOPY WITH PROPOFOL;  Surgeon: West Bali, MD;  Location: [facility];  Service: Endoscopy;  Laterality: N/A;  2:30pm   KNEE ARTHROSCOPY     Left anterior cruciate ligament reconstruction  left shoulder surgery     dr . Molly Maduro collins    POLYPECTOMY  05/06/2016   Procedure: POLYPECTOMY;  Surgeon: West Bali, MD;  Location: [facility];  Service: Endoscopy;;  transverse colon polyp, hepatic flexure polyp,    Right shoulder surgery     ROBOT ASSISTED LAPAROSCOPIC NEPHRECTOMY Left 07/04/2018   Procedure: XI ROBOTIC ASSISTED LAPAROSCOPIC RENAL CYST DECORTICATION;  Surgeon: Malen Gauze, MD;  Location: [facility];  Service: Urology;  Laterality: Left;  3 HRSPROCEDURE: ROBOTIC RENAL CYST DECORTICATION   TONSILLECTOMY     TOTAL KNEE ARTHROPLASTY     Allergies  Allergen Reactions   Clindamycin/Lincomycin Swelling and Other (See Comments)    Mycin drug family    Penicillin G Other (See Comments)   Penicillins Other (See Comments)    Has patient had a PCN reaction causing immediate rash, facial/tongue/throat swelling, SOB or lightheadedness with hypotension: unknown Has patient had a PCN reaction causing severe rash involving mucus membranes or skin necrosis: unknown Has patient had a PCN reaction that required hospitalization unknown Has patient had a PCN reaction occurring within the last 10 years:  unknown If all of the above answers are "NO", then may proceed    Pneumococcal 13-Val Conj Vacc Swelling    Patient stated that he has severe swelling in his arm from vaccine and swollen up to his collar bone. Lasted about 2 days and did not feel well.    Prior to Admission medications   Medication Sig Start Date End Date Taking? Authorizing Provider  amLODipine (NORVASC) 10 MG tablet TAKE 1 TABLET BY MOUTH DAILY 09/04/22  Yes Jonelle Sidle, MD  chlorthalidone (HYGROTON) 25 MG tablet Take 0.5 tablets (12.5 mg total) by mouth daily as needed. 09/25/22  Yes Jonelle Sidle, MD  Coenzyme Q10 (COQ10) 100 MG CAPS Take 1 capsule by mouth daily.   Yes [provider]  Evolocumab (REPATHA SURECLICK) 140 MG/ML SOAJ Inject 140 mg into the skin every 14 (fourteen) days. 08/17/22  Yes Jonelle Sidle, MD  flecainide (TAMBOCOR) 50 MG tablet TAKE 1 TABLET BY MOUTH TWICE  DAILY 09/04/22  Yes Jonelle Sidle, MD  furosemide (LASIX) 40 MG tablet TAKE 1 TABLET BY MOUTH DAILY AS  NEEDED FOR FLUID 04/06/22  Yes Jonelle Sidle, MD  losartan (COZAAR) 100 MG tablet TAKE 1 TABLET BY MOUTH DAILY 09/04/22  Yes Jonelle Sidle, MD  Multiple Vitamin (MULTIVITAMIN) tablet Take 1 tablet by mouth daily.   Yes [provider]  omeprazole (PRILOSEC) 20 MG capsule TAKE 1 CAPSULE BY MOUTH DAILY AS NEEDED FOR ACID REFLUX,  INDIGESTION 04/06/22  Yes Jonelle Sidle, MD  potassium chloride (KLOR-CON) 10 MEQ tablet Take 1 tablet (10 mEq total) by mouth daily as needed. 09/25/22  Yes Jonelle Sidle, MD  tamsulosin (FLOMAX) 0.4 MG CAPS capsule Take 0.4 mg by mouth. Takes occasionally.   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   Social Determinants of Health   Financial Resource Strain: Not on file  Food Insecurity: Not on file  Transportation Needs: Not on file  Physical Activity: Not on file  Stress: Not on file  Social Connections: Not on file  Intimate Partner Violence: Not on file    Review of Systems 13 point review of systems per patient health survey noted.  Negative other than as indicated above or in HPI.    Objective:   Vitals:   10/19/22 0816  BP: 128/64  Pulse: (!) 54  Temp: 97.7 F (36.5 C)  TempSrc: Oral  SpO2: 98%  Weight: 280 lb 9.6 oz (127.3 kg)  Height: 6\' 3"  (1.905 m)     Physical Exam Vitals reviewed.  Constitutional:      Appearance: He is well-developed.  HENT:     Head: Normocephalic and atraumatic.     Right Ear: External ear normal.     Left Ear: External ear normal.  Eyes:     Conjunctiva/sclera: Conjunctivae normal.     Pupils: Pupils are equal, round, and reactive to light.  Neck:     Thyroid: No thyromegaly.  Cardiovascular:     Rate and Rhythm: Normal rate and regular rhythm.     Heart sounds: Normal heart sounds.  Pulmonary:     Effort: Pulmonary effort is normal. No respiratory distress.     Breath sounds: Normal breath sounds. No wheezing.  Abdominal:     General: There is no distension.     Palpations: Abdomen is soft.     Tenderness: There is no abdominal tenderness.  Musculoskeletal:        General: No tenderness. Normal range of motion.     Cervical back: Normal range of motion and neck supple.   Lymphadenopathy:     Cervical: No cervical adenopathy.  Skin:    General: Skin is warm and dry.  Neurological:     Mental Status: He is alert and oriented to person, place, and time.     Deep Tendon Reflexes: Reflexes are normal and symmetric.  Psychiatric:        Behavior: Behavior normal.        Assessment & Plan:  Arley Daun Lammie is a 67 y.o. male . Annual physical exam  - -anticipatory guidance as below in AVS, screening labs above. Health maintenance items as above in HPI discussed/recommended as applicable.   -Recent labs noted in March, deferred labs today.  Keep follow-up with specialist as planned.  No med changes at this time.  -Shingles, RSV, COVID, flu vaccines discussed as above.  -Commended on exercise.  No orders of the defined types were placed in this encounter.  Patient Instructions  Check with gastroenterology to determine repeat timing of colonoscopy. I recommend updated COVID booster with flu vaccine this fall.  You also have the option of the RSV vaccine, see information below, let me know if there are questions.  Shingles vaccine is also recommended, that can be given at the pharmacy but let me know if there are questions.  No med changes today, keep follow-up with your specialist as planned.  Follow-up with me in 6 months, can recheck meds and determine if any blood work needed at that time.  Take care.  There is a recommendation for RSV vaccine for patients over age 25.   Typically would recommend the RSV vaccine as most important for patients over age 64 that have comorbidities that put them at increased risk for severe disease (heart disease such as congestive heart failure, coronary artery disease, lung disease such as asthma or COPD, kidney disease, liver disease, diabetes, chronic or progressive neurologic or muscular conditions, immunosuppressed, or being frail or of advanced age).  For others that do not have these risk factors, there still is some  benefit from vaccination since age is one of the main risk factors for developing severe disease however baseline risk of developing severe disease and requiring hospitalization is likely to be lower compared to those that have comorbidities in addition to age.   CDC does have some information as well: [employer]  Preventive Care 45 Years and Older, Male Preventive care refers to lifestyle choices and visits with your health care provider that can promote health and wellness. Preventive care visits are also called wellness exams. What can I expect for my preventive care visit? Counseling During your preventive care visit, your health care provider may ask about your: Medical history, including: Past medical problems. Family medical history. History of falls. Current health, including: Emotional well-being. Home life and relationship well-being. Sexual activity. Memory and ability to understand (cognition). Lifestyle, including: Alcohol, nicotine or tobacco, and drug use. Access to firearms. Diet, exercise, and sleep habits. Work and work Astronomer. Sunscreen use. Safety issues such as seatbelt and bike helmet use. Physical exam Your health care provider will check your: Height and weight. These may be used to calculate your BMI (body mass index). BMI is a measurement that tells if you are at a healthy weight. Waist circumference. This measures the distance around your waistline. This measurement also tells if you are at a healthy weight and may help predict your risk of certain diseases, such as type 2 diabetes and high blood pressure. Heart rate and blood pressure. Body temperature. Skin for abnormal spots. What immunizations do I need?  Vaccines are usually given at various ages, according to a schedule. Your health care provider will recommend vaccines for you based on your age, medical history, and lifestyle or other factors,  such as travel or where you work. What tests do I need? Screening Your health care provider may recommend screening tests for certain conditions. This may include: Lipid and cholesterol levels. Diabetes screening. This is done by checking your blood sugar (glucose) after you have not eaten for a while (fasting). Hepatitis C test. Hepatitis B test. HIV (human immunodeficiency virus) test. STI (sexually transmitted infection) testing, if you are at risk. Lung cancer screening. Colorectal cancer screening. Prostate cancer screening. Abdominal aortic aneurysm (AAA) screening. You may need this if you are a current or former smoker. Talk with your health care provider about your test results, treatment options, and if necessary, the need for more tests. Follow these instructions at home: Eating and drinking  Eat a diet that includes fresh fruits and vegetables, whole grains, lean protein, and low-fat dairy products. Limit your intake of foods with high amounts of sugar, saturated fats, and salt. Take vitamin and mineral supplements as recommended by your health care provider. Do not drink alcohol if your health care provider tells you not to drink. If you drink alcohol: Limit how much you have to 0-2 drinks a day. Know how much alcohol is in your drink. In the [place] one drink  equals one 12 oz bottle of beer (355 mL), one 5 oz glass of wine (148 mL), or one 1 oz glass of hard liquor (44 mL). Lifestyle Brush your teeth every morning and night with fluoride toothpaste. Floss one time each day. Exercise for at least 30 minutes 5 or more days each week. Do not use any products that contain nicotine or tobacco. These products include cigarettes, chewing tobacco, and vaping devices, such as e-cigarettes. If you need help quitting, ask your health care provider. Do not use drugs. If you are sexually active, practice safe sex. Use a condom or other form of protection to prevent STIs. Take aspirin  only as told by your health care provider. Make sure that you understand how much to take and what form to take. Work with your health care provider to find out whether it is safe and beneficial for you to take aspirin daily. Ask your health care provider if you need to take a cholesterol-lowering medicine (statin). Find healthy ways to manage stress, such as: Meditation, yoga, or listening to music. Journaling. Talking to a trusted person. Spending time with friends and family. Safety Always wear your seat belt while driving or riding in a vehicle. Do not drive: If you have been drinking alcohol. Do not ride with someone who has been drinking. When you are tired or distracted. While texting. If you have been using any mind-altering substances or drugs. Wear a helmet and other protective equipment during sports activities. If you have firearms in your house, make sure you follow all gun safety procedures. Minimize exposure to UV radiation to reduce your risk of skin cancer. What's next? Visit your health care provider once a year for an annual wellness visit. Ask your health care provider how often you should have your eyes and teeth checked. Stay up to date on all vaccines. This information is not intended to replace advice given to you by your health care provider. Make sure you discuss any questions you have with your health care provider. Document Revised: 10/23/2020 Document Reviewed: 10/23/2020 Elsevier Patient Education  2024 Elsevier Inc.     Signed,   Meredith Staggers, MD [facility], [facility] 10/19/22 9:01 AM

## 2022-10-26 ENCOUNTER — Ambulatory Visit (INDEPENDENT_AMBULATORY_CARE_PROVIDER_SITE_OTHER): Payer: Medicare Other | Admitting: Family Medicine

## 2022-10-26 ENCOUNTER — Encounter (INDEPENDENT_AMBULATORY_CARE_PROVIDER_SITE_OTHER): Payer: Self-pay | Admitting: Family Medicine

## 2022-10-26 VITALS — BP 125/76 | HR 55 | Temp 98.1°F | Ht 75.0 in | Wt 276.0 lb

## 2022-10-26 DIAGNOSIS — E669 Obesity, unspecified: Secondary | ICD-10-CM

## 2022-10-26 DIAGNOSIS — R7303 Prediabetes: Secondary | ICD-10-CM | POA: Diagnosis not present

## 2022-10-26 DIAGNOSIS — Z6835 Body mass index (BMI) 35.0-35.9, adult: Secondary | ICD-10-CM

## 2022-10-26 DIAGNOSIS — E65 Localized adiposity: Secondary | ICD-10-CM

## 2022-10-26 DIAGNOSIS — I10 Essential (primary) hypertension: Secondary | ICD-10-CM | POA: Diagnosis not present

## 2022-10-26 NOTE — Telephone Encounter (Signed)
Please advise 

## 2022-10-26 NOTE — Progress Notes (Signed)
Jonathan Dyer, D.O.  ABFM, ABOM Specializing in Clinical Bariatric Medicine  Office located at: [address] [place], [place]  [address]     Assessment and Plan:   Essential hypertension Assessment: His blood pressure is stable. No concerns.   Hypertension treated with amlodipine 10 mg daily, losartan 100 mg daily, chlorthalidone half tablet as needed (every few weeks), rare need for furosemide as needed (about once per month with potassium). Denies any adverse effects.   Last 3 blood pressure readings in our office are as follows: BP Readings from Last 3 Encounters:  10/26/22 125/76  10/19/22 128/64  10/14/22 131/72   Plan: Continue with all medications as prescribed by specialist. Continue with Prudent nutritional plan and low sodium diet, advance exercise as tolerated.    Ambulatory blood pressure monitoring encouraged.  Reminded patient that if they ever feel poorly in any way, to check their blood pressure and pulse as well.We will continue to monitor closely alongside PCP/ specialists.  Pt reminded to also f/up with those individuals as instructed by them. We will continue to monitor symptoms as they relate to the his weight loss journey.    Prediabetes Assessment: Condition is improving, but not optimized. This condition is diet controlled, patient not taking any prediabetic medication. He reports that his hunger and cravings are mostly controlled. He endorses sometimes eating regular peanut butter at night when hungry.   Lab Results  Component Value Date   HGBA1C 5.7 (H) 07/21/2022   HGBA1C 6.0 (H) 01/27/2022   HGBA1C 5.5 10/03/2019   INSULIN 11.5 07/21/2022   INSULIN 12.6 01/27/2022    Plan:  Showed patient PB2, a healthier alternate to peanut butter.   Darivs will continue to work on weight loss, exercise, via their meal plan we devised to help decrease the risk of progressing to diabetes. We will recheck A1c and fasting insulin level in approximately 3  months from last check, or as deemed appropriate.    Visceral obesity Assessment: Current visceral fat rating: 21.  The visceral fat rating should  < 10 in a male.  Plan: Counseling done on: Visceral adipose tissue is a hormonally active component of total body fat. This body composition phenotype is associated with medical disorders such as metabolic syndrome, cardiovascular disease and several malignancies including prostate, breast, and colorectal cancers.  Goal: Lose 7-10% of starting weight.   Limit Simple Carbs and High Fat foods- follow our meal plan.       TREATMENT PLAN FOR OBESITY: BMI 35.0-35.9,adult Obesity, Beginning BMI 34.25 Assessment: Jonathan Dyer is here to discuss his progress with his obesity treatment plan along with follow-up of his obesity related diagnoses. See Medical Weight Management Flowsheet for complete bioelectrical impedance results.  Condition is improving. Biometric data collected today, was reviewed with patient.   Since last office visit on 10/14/22 patient's  Muscle mass has increased by 0.2 lb. Fat mass has decreased by 7 lb. Total body water has decreased by 0.4 lb.  Counseling done on how various foods will affect these numbers and how to maximize success  Total lbs lost to date: -4  Total weight loss percentage to date:  1.43  Plan: Continue the Category 3 meal plan    - Encouraged pt to aim to eat foods with a 10:1 ratio of calories and protein.  Behavioral Intervention Additional resources provided today: patient declined Evidence-based interventions for health behavior change were utilized today including the discussion of self monitoring techniques, problem-solving barriers  and SMART goal setting techniques.   Regarding patient's less desirable eating habits and patterns, we employed the technique of small changes.  Pt will specifically work on: adding strength training 2 days a wk to his biking regiment  and measuring his protein/fruit  intake for next visit.    Recommended Physical Activity Goals  Jonathan Dyer has been advised to slowly work up to 150 minutes of moderate intensity aerobic activity a week and strengthening exercises 2-3 times per week for cardiovascular health, weight loss maintenance and preservation of muscle mass.   He has agreed to Think about ways to add strength training into his current exercise regiment.  FOLLOW UP: Return in about 4 weeks (around 11/23/2022). He was informed of the importance of frequent follow up visits to maximize his success with intensive lifestyle modifications for his multiple health conditions.  Subjective:   Chief complaint: Obesity Jonathan Dyer is here to discuss his progress with his obesity treatment plan. He is on the the Category 3 Plan and states he is following his eating plan approximately 85% of the time. He states he is biking 1.5-2 hr 3 days per week.  Interval History:  Jonathan Dyer is here for a follow up office visit. This is my first time meeting with Farrel. He is a usual pt of Dr.Beasley.   Since last office visit, Jonathan Dyer has been doing well. He has been focusing on snacking less and increasing his protein intake.  When eating on plan, his hunger and cravings are mostly well controlled. He endorses sometimes eating regular peanut butter at night when hungry.   Review of Systems:  Pertinent positives were addressed with patient today.  Reviewed by clinician on day of visit: allergies, medications, problem list, medical history, surgical history, family history, social history, and previous encounter notes.  Weight Summary and Biometrics   Weight Lost Since Last Visit: 6lb  Weight Gained Since Last Visit: 0lb    Vitals Temp: 98.1 F (36.7 C) BP: 125/76 Pulse Rate: (!) 55 SpO2: 95 %   Anthropometric Measurements Height: 6\' 3"  (1.905 m) Weight: 276 lb (125.2 kg) BMI (Calculated): 34.5 Weight at Last Visit: 282lb Weight Lost Since Last Visit: 6lb Weight  Gained Since Last Visit: 0lb Starting Weight: 280lb Total Weight Loss (lbs): 6 lb (2.722 kg)   Body Composition  Body Fat %: 33.6 % Fat Mass (lbs): 92.8 lbs Muscle Mass (lbs): 174.2 lbs Total Body Water (lbs): 132.4 lbs Visceral Fat Rating : 21   Other Clinical Data Fasting: no Labs: no Today's Visit #: 11   Objective:   PHYSICAL EXAM: Blood pressure 125/76, pulse (!) 55, temperature 98.1 F (36.7 C), height 6\' 3"  (1.905 m), weight 276 lb (125.2 kg), SpO2 95 %. Body mass index is 34.5 kg/m.  General: Well Developed, well nourished, and in no acute distress.  HEENT: Normocephalic, atraumatic Skin: Warm and dry, cap RF less 2 sec, good turgor Chest:  Normal excursion, shape, no gross abn Respiratory: speaking in full sentences, no conversational dyspnea NeuroM-Sk: Ambulates w/o assistance, moves * 4 Psych: A and O *3, insight good, mood-full  DIAGNOSTIC DATA REVIEWED:  BMET    Component Value Date/Time   NA 144 07/21/2022 0802   K 4.4 07/21/2022 0802   CL 104 07/21/2022 0802   CO2 23 07/21/2022 0802   GLUCOSE 91 07/21/2022 0802   GLUCOSE 86 04/01/2021 0813   BUN 21 07/21/2022 0802   CREATININE 1.35 (H) 07/21/2022 0802   CREATININE 1.34 (H) 10/31/2020 4098  CALCIUM 9.5 07/21/2022 0802   GFRNONAA 60 (L) 02/04/2021 0814   GFRNONAA 54 (L) 10/31/2020 0946   GFRAA 63 10/31/2020 0946   Lab Results  Component Value Date   HGBA1C 5.7 (H) 07/21/2022   HGBA1C 5.7 10/21/2018   Lab Results  Component Value Date   INSULIN 11.5 07/21/2022   INSULIN 12.6 01/27/2022   Lab Results  Component Value Date   TSH 2.560 07/21/2022   CBC    Component Value Date/Time   WBC 5.5 07/21/2022 0802   WBC 6.1 06/28/2018 0909   RBC 5.15 07/21/2022 0802   RBC 4.92 10/21/2018 0000   HGB 15.7 07/21/2022 0802   HCT 46.5 07/21/2022 0802   PLT 253 07/21/2022 0802   MCV 90 07/21/2022 0802   MCH 30.5 07/21/2022 0802   MCH 31.0 06/28/2018 0909   MCHC 33.8 07/21/2022 0802   MCHC  33.9 06/28/2018 0909   RDW 12.6 07/21/2022 0802   Iron Studies    Component Value Date/Time   IRON 86 07/21/2022 0802   TIBC 280 07/21/2022 0802   FERRITIN 228 07/21/2022 0802   IRONPCTSAT 31 07/21/2022 0802   Lipid Panel     Component Value Date/Time   CHOL 262 (H) 07/21/2022 0802   TRIG 134 07/21/2022 0802   HDL 48 07/21/2022 0802   CHOLHDL 6 04/01/2021 0813   VLDL 24.4 04/01/2021 0813   LDLCALC 190 (H) 07/21/2022 0802   LDLCALC 131 (H) 10/03/2019 0926   Hepatic Function Panel     Component Value Date/Time   PROT 7.1 07/21/2022 0802   ALBUMIN 4.5 07/21/2022 0802   AST 13 07/21/2022 0802   ALT 13 07/21/2022 0802   ALKPHOS 95 07/21/2022 0802   BILITOT 0.4 07/21/2022 0802      Component Value Date/Time   TSH 2.560 07/21/2022 0802   Nutritional Lab Results  Component Value Date   VD25OH 43.3 07/21/2022   VD25OH 39.9 01/27/2022   VD25OH 30.49 04/01/2021    Attestations:  Patient was in the office today and time spent on visit including pre-visit chart review and post-visit care/coordination of care and electronic medical record documentation was 40 minutes. 50% of the time was in face to face counseling of this patient's medical condition(s) and providing education on treatment options to include the first-line treatment of diet and lifestyle modification.   I, Special Randolm Idol, acting as a Stage manager for [employer], DO., have compiled all relevant documentation for today's office visit on behalf of Thomasene Lot, DO, while in the presence of [employer], DO.  I have reviewed the above documentation for accuracy and completeness, and I agree with the above. Jonathan Dyer, D.O.  The 21st Century Cures Act was signed into law in 2016 which includes the topic of electronic health records.  This provides immediate access to information in MyChart.  This includes consultation notes, operative notes, office notes, lab results and pathology reports.  If you  have any questions about what you read please let us know at your next visit so we can discuss your concerns and take corrective action if need be.  We are right here with you.

## 2022-10-29 ENCOUNTER — Other Ambulatory Visit (HOSPITAL_COMMUNITY): Payer: Self-pay | Admitting: Physician Assistant

## 2022-10-29 DIAGNOSIS — M25562 Pain in left knee: Secondary | ICD-10-CM

## 2022-10-29 DIAGNOSIS — Z96652 Presence of left artificial knee joint: Secondary | ICD-10-CM | POA: Diagnosis not present

## 2022-10-29 DIAGNOSIS — M25551 Pain in right hip: Secondary | ICD-10-CM | POA: Diagnosis not present

## 2022-10-30 ENCOUNTER — Other Ambulatory Visit (HOSPITAL_COMMUNITY): Payer: Self-pay | Admitting: Physician Assistant

## 2022-10-30 DIAGNOSIS — M25562 Pain in left knee: Secondary | ICD-10-CM

## 2022-11-23 ENCOUNTER — Ambulatory Visit (INDEPENDENT_AMBULATORY_CARE_PROVIDER_SITE_OTHER): Payer: Medicare Other | Admitting: Adult Health

## 2022-11-23 ENCOUNTER — Encounter (INDEPENDENT_AMBULATORY_CARE_PROVIDER_SITE_OTHER): Payer: Self-pay | Admitting: Adult Health

## 2022-11-23 VITALS — BP 104/63 | HR 52 | Temp 97.4°F | Ht 75.0 in | Wt 277.0 lb

## 2022-11-23 DIAGNOSIS — I10 Essential (primary) hypertension: Secondary | ICD-10-CM | POA: Diagnosis not present

## 2022-11-23 DIAGNOSIS — E65 Localized adiposity: Secondary | ICD-10-CM | POA: Diagnosis not present

## 2022-11-23 DIAGNOSIS — Z6834 Body mass index (BMI) 34.0-34.9, adult: Secondary | ICD-10-CM | POA: Diagnosis not present

## 2022-11-23 DIAGNOSIS — E669 Obesity, unspecified: Secondary | ICD-10-CM | POA: Diagnosis not present

## 2022-11-23 DIAGNOSIS — R3912 Poor urinary stream: Secondary | ICD-10-CM | POA: Diagnosis not present

## 2022-11-23 DIAGNOSIS — Z6835 Body mass index (BMI) 35.0-35.9, adult: Secondary | ICD-10-CM

## 2022-11-23 NOTE — Progress Notes (Signed)
WEIGHT SUMMARY AND BIOMETRICS  Vitals Temp: (!) 97.4 F (36.3 C) BP: 104/63 Pulse Rate: (!) 52 SpO2: 96 %   Anthropometric Measurements Height: 6\' 3"  (1.905 m) Weight: 277 lb (125.6 kg) BMI (Calculated): 34.62 Weight at Last Visit: 276lb Weight Lost Since Last Visit: 0 Weight Gained Since Last Visit: 1lb Starting Weight: 274lb Total Weight Loss (lbs): 5 lb (2.268 kg)   Body Composition  Body Fat %: 33 % Fat Mass (lbs): 91.4 lbs Muscle Mass (lbs): 176.8 lbs Total Body Water (lbs): 128.8 lbs Visceral Fat Rating : 21   Other Clinical Data Fasting: no Labs: no Today's Visit #: 12 Starting Date: 01/27/22    Chief Complaint:   OBESITY Jonathan Dyer is here to discuss his progress with his obesity treatment plan. He is on the the Category 3 Plan and states he is following his eating plan approximately 50 % of the time. He states he is exercising- body weight exercise at home.   Interim History:  Discussed how the clinic scales utilizes bioelectrical impedance analysis (BIA) to estimate the relative percentages of different tissues and substances within the body. BIA sends a weak electrical impulse through the body. The impulse encounters varying levels of resistance or "impedance" from the different tissues and substances.  Reviewed Bioemepedence results with pt: Muscle Mass:+ 2.6 lbs Adipose Mass: -1.4 lbs Visceral Adipose Rating 21  Subjective:   1. Essential hypertension BP soft, yet stable at OV today. Jonathan Dyer has been checking BP at home, SBP 120-140 DBP 60-70 He is currently on furosemide (LASIX) 40 MG tablet- once per month, coupled with K+ replacement  Evolocumab (REPATHA SURECLICK) 140 MG/ML SOAJ  losartan (COZAAR) 100 MG tablet  flecainide (TAMBOCOR) 50 MG tablet  amLODipine (NORVASC) 10 MG tablet  chlorthalidone (HYGROTON) 25 MG tablet- every few weeks   2. Visceral obesity  10/14/22 14:00 10/26/22 11:00 11/23/22 11:00  Visceral Fat Rating   22 21 21     01/27/22 08:00  Waist Measurement  49 inches   Visceral rating goal for men <10  Assessment/Plan:   1. Essential hypertension Remain well hydrated When working outside or exercise and temp >80 degree F- recommend in adding in lite electrolyte drinks. Continue to monitor home BP readings and sx's of hypoglycemia.  2. Visceral obesity Continue with weight loss efforts  3. BMI 35.0-35.9,adult (current BMI 34.62)  Jonathan Dyer is currently in the action stage of change. As such, his goal is to continue with weight loss efforts. He has agreed to the Category 3 Plan.   Exercise goals: Older adults should follow the adult guidelines. When older adults cannot meet the adult guidelines, they should be as physically active as their abilities and conditions will allow.   Behavioral modification strategies: increasing lean protein intake, decreasing simple carbohydrates, increasing vegetables, increasing water intake, meal planning and cooking strategies, keeping healthy foods in the home, ways to avoid boredom eating, and planning for success.  Jonathan Dyer has agreed to follow-up with our clinic in 4 weeks. He was informed of the importance of frequent follow-up visits to maximize his success with intensive lifestyle modifications for his multiple health conditions.   Objective:   Blood pressure 104/63, pulse (!) 52, temperature (!) 97.4 F (36.3 C), height 6\' 3"  (1.905 m), weight 277 lb (125.6 kg), SpO2 96%. Body mass index is 34.62 kg/m.  General: Cooperative, alert, well developed, in no acute distress. HEENT: Conjunctivae and lids unremarkable. Cardiovascular: Regular rhythm.  Lungs: Normal work of breathing. Neurologic:  No focal deficits.   Lab Results  Component Value Date   CREATININE 1.35 (H) 07/21/2022   BUN 21 07/21/2022   NA 144 07/21/2022   K 4.4 07/21/2022   CL 104 07/21/2022   CO2 23 07/21/2022   Lab Results  Component Value Date   ALT 13 07/21/2022   AST 13  07/21/2022   ALKPHOS 95 07/21/2022   BILITOT 0.4 07/21/2022   Lab Results  Component Value Date   HGBA1C 5.7 (H) 07/21/2022   HGBA1C 6.0 (H) 01/27/2022   HGBA1C 5.5 10/03/2019   HGBA1C 5.7 10/21/2018   Lab Results  Component Value Date   INSULIN 11.5 07/21/2022   INSULIN 12.6 01/27/2022   Lab Results  Component Value Date   TSH 2.560 07/21/2022   Lab Results  Component Value Date   CHOL 262 (H) 07/21/2022   HDL 48 07/21/2022   LDLCALC 190 (H) 07/21/2022   TRIG 134 07/21/2022   CHOLHDL 6 04/01/2021   Lab Results  Component Value Date   VD25OH 43.3 07/21/2022   VD25OH 39.9 01/27/2022   VD25OH 30.49 04/01/2021   Lab Results  Component Value Date   WBC 5.5 07/21/2022   HGB 15.7 07/21/2022   HCT 46.5 07/21/2022   MCV 90 07/21/2022   PLT 253 07/21/2022   Lab Results  Component Value Date   IRON 86 07/21/2022   TIBC 280 07/21/2022   FERRITIN 228 07/21/2022    Attestation Statements:   Reviewed by clinician on day of visit: allergies, medications, problem list, medical history, surgical history, family history, social history, and previous encounter notes.  Time spent on visit including pre-visit chart review and post-visit care and charting was 29 minutes.   I have reviewed the above documentation for accuracy and completeness, and I agree with the above. -  Thea Holshouser d. Natash Berman, NP-C

## 2022-11-26 ENCOUNTER — Encounter (HOSPITAL_COMMUNITY)
Admission: RE | Admit: 2022-11-26 | Discharge: 2022-11-26 | Disposition: A | Discharge status: Home / Self Care | Payer: Medicare Other | Source: Ambulatory Visit | Attending: Physician Assistant | Admitting: Physician Assistant

## 2022-11-26 DIAGNOSIS — M25562 Pain in left knee: Secondary | ICD-10-CM | POA: Diagnosis not present

## 2022-11-26 DIAGNOSIS — Z96652 Presence of left artificial knee joint: Secondary | ICD-10-CM | POA: Diagnosis not present

## 2022-11-26 MED ORDER — TECHNETIUM TC 99M MEDRONATE IV KIT
20.0000 | PACK | Freq: Once | INTRAVENOUS | Status: AC | PRN
  Administered 2022-11-26: 20 via INTRAVENOUS

## 2022-11-30 ENCOUNTER — Ambulatory Visit: Payer: Medicare Other | Attending: Nurse Practitioner | Admitting: Nurse Practitioner

## 2022-11-30 ENCOUNTER — Encounter: Payer: Self-pay | Admitting: Nurse Practitioner

## 2022-11-30 VITALS — BP 138/72 | HR 59 | Ht 75.0 in | Wt 287.8 lb

## 2022-11-30 DIAGNOSIS — I77819 Aortic ectasia, unspecified site: Secondary | ICD-10-CM

## 2022-11-30 DIAGNOSIS — I7121 Aneurysm of the ascending aorta, without rupture: Secondary | ICD-10-CM

## 2022-11-30 DIAGNOSIS — I4819 Other persistent atrial fibrillation: Secondary | ICD-10-CM

## 2022-11-30 DIAGNOSIS — I10 Essential (primary) hypertension: Secondary | ICD-10-CM | POA: Diagnosis not present

## 2022-11-30 DIAGNOSIS — I2584 Coronary atherosclerosis due to calcified coronary lesion: Secondary | ICD-10-CM | POA: Diagnosis not present

## 2022-11-30 DIAGNOSIS — R931 Abnormal findings on diagnostic imaging of heart and coronary circulation: Secondary | ICD-10-CM

## 2022-11-30 DIAGNOSIS — E782 Mixed hyperlipidemia: Secondary | ICD-10-CM | POA: Diagnosis not present

## 2022-11-30 DIAGNOSIS — I2510 Atherosclerotic heart disease of native coronary artery without angina pectoris: Secondary | ICD-10-CM | POA: Diagnosis not present

## 2022-11-30 DIAGNOSIS — Z79899 Other long term (current) drug therapy: Secondary | ICD-10-CM | POA: Diagnosis not present

## 2022-11-30 NOTE — Patient Instructions (Addendum)
Medication Instructions:  Your physician recommends that you continue on your current medications as directed. Please refer to the Current Medication list given to you today.  Labwork: FLP, and LFT in 1 month   Testing/Procedures: none  Follow-Up: Your physician recommends that you schedule a follow-up appointment in: 4 Months with Philis Nettle   Any Other Special Instructions Will Be Listed Below (If Applicable).  If you need a refill on your cardiac medications before your next appointment, please call your pharmacy.

## 2022-11-30 NOTE — Progress Notes (Unsigned)
Cardiology Office Note:  .   Date: 11/30/2022 ID:  Jonathan Dyer, DOB January 11, 1956, MRN [id number] PCP: Shade Flood, MD  [facility] Providers Cardiologist:  Nona Dell, MD Electrophysiologist:  Lanier Prude, MD {  History of Present Illness: .   Jonathan Dyer is a 67 y.o. male with a PMH of paroxysmal to persistent A-fib, mixed hyperlipidemia with history of statin myalgias, hypertension, ascending aortic aneurysm, who presents today for 48-month follow-up.  Last seen by Dr. Diona Browner on Sep 25, 2022.  He was very active at the time riding his bike.  Was overall doing well from a cardiac perspective.  He was planning on watchman implantation per Dr. Lalla Brothers in September.  This has been scheduled for February 04, 2023.  Today presents for 75-month follow-up.  He states he is doing well. Denies any chest pain, shortness of breath, palpitations, syncope, presyncope, dizziness, orthopnea, PND, swelling or significant weight changes, acute bleeding, or claudication. Has some questions regarding the recovery time and what to expect regarding his upcoming surgery in September with Dr. Lalla Brothers. Still remains very active.   Studies Reviewed: [name]    Lexiscan 07/2022:    Findings are consistent with no ischemia. The study is low risk.   No ST deviation was noted. The ECG was negative for ischemia.  Low risk Duke treadmill score of 9.   LV perfusion is equivocal.  Small, mild intensity, mid to apical inferolateral defect that is predominantly fixed with partial reversibility suggestive of variable soft tissue attenuation artifact.  Importantly, no large ischemic territories are noted.   Left ventricular function is normal. Nuclear stress EF: 53 %.   Low risk study with Duke treadmill score of 9, no exercise-induced arrhythmias, and perfusion imaging most consistent with variable soft tissue attenuation.  No large ischemic territories are noted.  LVEF 53%.  CT cardiac  07/2022:  IMPRESSION: 1. Fusiform aneurysmal dilatation of the ascending aorta, maximal dimension 4.3 cm. This retrospectively appears stable to 2011 exam recommend annual imaging followup by CTA or MRA. This recommendation follows 2010 ACCF/AHA/AATS/ACR/ASA/SCA/SCAI/SIR/STS/SVM Guidelines for the Diagnosis and Management of Patients with Thoracic Aortic Disease. Circulation. 2010; 121: W098-J191. Aortic aneurysm NOS (ICD10-I71.9) 2. Peri fissural nodule most consistent with intrapulmonary lymph node on the right, needing no further follow-up.   Echo 04/2022:  1. Left ventricular ejection fraction, by estimation, is 55 to 60%. The  left ventricle has normal function. The left ventricle has no regional  wall motion abnormalities. Left ventricular diastolic parameters are  indeterminate. The average left  ventricular global longitudinal strain is -22.7 %. The global longitudinal  strain is normal.   2. Right ventricular systolic function not well visualized but appears to  be mildly reduced. The right ventricular size is mildly enlarged.   3. The mitral valve is grossly normal. Mild mitral valve regurgitation.  No evidence of mitral stenosis.   4. The aortic valve is tricuspid. There is mild calcification of the  aortic valve. Aortic valve regurgitation is not visualized. No aortic  stenosis is present.   5. Aortic dilatation noted. There is mild dilatation of the aortic root,  measuring 44 mm. There is mild dilatation of the ascending aorta,  measuring 44 mm. There is mild dilatation of the aortic arch, measuring 40 mm.   Risk Assessment/Calculations:    CHA2DS2-VASc Score = 4  This indicates a 4.8% annual risk of stroke. The patient's score is based upon: CHF History: 0 HTN History: 1 Diabetes History:  1 Stroke History: 0 Vascular Disease History: 1 Age Score: 1 Gender Score: 0      The 10-year ASCVD risk score (Arnett DK, et al., 2019) is: 25.6%   Values used to calculate  the score:     Age: 65 years     Sex: Male     Is Non-Hispanic African American: No     Diabetic: No     Tobacco smoker: No     Systolic Blood Pressure: 138 mmHg     Is BP treated: Yes     HDL Cholesterol: 48 mg/dL     Total Cholesterol: 262 mg/dL      Physical Exam:   VS:  BP 138/72   Pulse (!) 59   Ht 6\' 3"  (1.905 m)   Wt 287 lb 12.8 oz (130.5 kg)   SpO2 93%   BMI 35.97 kg/m    Wt Readings from Last 3 Encounters:  11/30/22 287 lb 12.8 oz (130.5 kg)  11/23/22 277 lb (125.6 kg)  10/26/22 276 lb (125.2 kg)    GEN: Well nourished, well developed in no acute distress NECK: No JVD; No carotid bruits CARDIAC: S1/S2, RRR, no murmurs, rubs, gallops RESPIRATORY:  Clear to auscultation without rales, wheezing or rhonchi  ABDOMEN: Soft, non-tender, non-distended EXTREMITIES:  No edema; No deformity   ASSESSMENT AND PLAN: .    Paroxysmal to persistent A-fib Denies any tachycardia or palpitations.  Heart rate well-controlled today.  CHA2DS2-VASc score 4.  He is scheduled for upcoming watchman implantation with Dr. Lalla Brothers in September.  Continue current medication regimen. Not on OAC.   Coronary artery calcification Coronary CTA in March 2024 revealed coronary artery calcium score 5250. Stable with no anginal symptoms. No indication for ischemic evaluation. Continue current medication regimen.   Mixed HLD, medication management Hx of intolerance to statins. Appears he started on Repatha earlier this year. He is due for repeat labs. Will obtain FLP and LFT at this time. Heart healthy diet and regular cardiovascular exercise encouraged.   HTN BP stable. Discussed to monitor BP at home at least 2 hours after medications and sitting for 5-10 minutes. Continue current medication regimen. Heart healthy diet and regular cardiovascular exercise encouraged.   Ascending aortic aneurysm, aortic dilatation CT cardiac scan this year revealed fusiform aneurysmal dilatation of ascending aorta  with maximal dimension at 4.3 cm.  Annual imaging recommended by CTA or MRA, would recommend repeating imaging 07/2023.  Echocardiogram 04/2022 revealed mild dilatation aortic root at 44 mm with mild dilatation of aortic arch measuring 40 mm.  As stated above, plan to repeat CTA 07/2023 for monitoring. Care and ED precautions discussed.     Dispo: Follow-up in 4 months with me or APP or sooner if anything changes.  Signed, Sharlene Dory, NP

## 2022-12-07 ENCOUNTER — Encounter: Payer: Self-pay | Admitting: Cardiology

## 2022-12-07 ENCOUNTER — Ambulatory Visit (INDEPENDENT_AMBULATORY_CARE_PROVIDER_SITE_OTHER): Payer: Medicare Other | Admitting: Family Medicine

## 2022-12-07 ENCOUNTER — Encounter (INDEPENDENT_AMBULATORY_CARE_PROVIDER_SITE_OTHER): Payer: Self-pay | Admitting: Family Medicine

## 2022-12-07 VITALS — BP 131/72 | HR 52 | Temp 98.1°F | Ht 75.0 in | Wt 276.0 lb

## 2022-12-07 DIAGNOSIS — Z6834 Body mass index (BMI) 34.0-34.9, adult: Secondary | ICD-10-CM | POA: Diagnosis not present

## 2022-12-07 DIAGNOSIS — E669 Obesity, unspecified: Secondary | ICD-10-CM | POA: Diagnosis not present

## 2022-12-07 DIAGNOSIS — R7303 Prediabetes: Secondary | ICD-10-CM | POA: Diagnosis not present

## 2022-12-07 DIAGNOSIS — Z6835 Body mass index (BMI) 35.0-35.9, adult: Secondary | ICD-10-CM

## 2022-12-07 DIAGNOSIS — I10 Essential (primary) hypertension: Secondary | ICD-10-CM | POA: Diagnosis not present

## 2022-12-07 NOTE — Progress Notes (Signed)
[name], D.O.  ABFM, ABOM Specializing in Clinical Bariatric Medicine  Office located at: [address] [place], [place]  [address]     Assessment and Plan:   Essential hypertension Assessment: Condition is Controlled. This condition is controlled with Norvasc daily and losartan, chlorthalidone, and lasix as needed. Pt is tolerating these well and denies any adverse side effects.  Last 3 blood pressure readings in our office are as follows: BP Readings from Last 3 Encounters:  12/07/22 131/72  11/30/22 138/72  11/23/22 104/63   Plan: - He is to continue to be compliant with Norvasc, losartan, chlorthalidone, and lasix as directed.   Lifestyle changes such as following our low salt, heart healthy meal plan and engaging in a regular exercise program discussed and avoid buying foods that are: processed, frozen, or prepackaged to avoid excess salt.  - We will continue to monitor closely alongside PCP/ specialists.  Pt reminded to also f/up with those individuals as instructed by them.   - We will continue to monitor symptoms as they relate to the his weight loss journey.   Prediabetes Assessment: Condition is Improving, but not optimized. This is diet/exercise controlled. He endorses snacking as he is still having cravings. He endorses possibly eating too many carbs and not the right portions.  Lab Results  Component Value Date   HGBA1C 5.7 (H) 07/21/2022   HGBA1C 6.0 (H) 01/27/2022   HGBA1C 5.5 10/03/2019   INSULIN 11.5 07/21/2022   INSULIN 12.6 01/27/2022    Plan: - Continue to decrease simple carbs/ sugars; increase fiber and proteins -> follow his meal plan. I also explained role of simple carbs and insulin levels on hunger and cravings.   - Anticipatory guidance given.    - Pt will continue to work on weight loss, exercise, via their meal plan we devised.  - We will recheck A1c and fasting insulin level in approximately 3 months from last check, or as  deemed appropriate.    TREATMENT PLAN FOR OBESITY: Obesity, Beginning BMI 34.25 BMI 35.0-35.9,adult (current BMI 34.5) Assessment:  Jonathan Dyer is here to discuss his progress with his obesity treatment plan along with follow-up of his obesity related diagnoses. See Medical Weight Management Flowsheet for complete bioelectrical impedance results.  Condition is not optimized. Biometric data collected today, was reviewed with patient.   Since last office visit on 0715/2024 patient's  Muscle mass has decreased by 1.4lb. Fat mass has increased by 0.6lb. Total body water has decreased by 3.2lb.  Counseling done on how various foods will affect these numbers and how to maximize success  Total lbs lost to date: +2lbs Total weight loss percentage to date: 0.73%  Plan:  - Continue the Category 3 meal plan    - Unless pre-existing renal or cardiopulmonary conditions exist which patient was told to limit their fluid intake by another provider, I recommended roughly one half of their weight in pounds, to be the approximate ounces of non-caloric, non-caffeinated beverages they should drink per day; including more if they are engaging in exercise.  Behavioral Intervention Additional resources provided today: category 3 meal plan information, protein equivalence handout, and healthy carb handout Evidence-based interventions for health behavior change were utilized today including the discussion of self monitoring techniques, problem-solving barriers and SMART goal setting techniques.   Regarding patient's less desirable eating habits and patterns, we employed the technique of small changes.  Pt will specifically work on: follow meal plan pay attention to portions sizes  for next visit.    Recommended Physical Activity Goals  Jonathan Dyer has been advised to slowly work up to 150 minutes of moderate intensity aerobic activity a week and strengthening exercises 2-3 times per week for cardiovascular health,  weight loss maintenance and preservation of muscle mass.   He has agreed to Continue current level of physical activity    FOLLOW UP: Return in about 3 weeks (around 12/28/2022). He was informed of the importance of frequent follow up visits to maximize his success with intensive lifestyle modifications for his multiple health conditions.  Subjective:   Chief complaint: Obesity Jonathan Dyer is here to discuss his progress with his obesity treatment plan. He is on the the Category 3 Plan with 300 snack calories and states he is following his eating plan approximately 70% of the time. He states he is weight/bicycling 60 minutes 3 days per week.  Interval History:  Jonathan Dyer is here for a follow up office visit.     Since last OV he has been well. He informed me that his weight loss journey has been up and down. He has been trying to increase his protein intake more but still struggles with hitting his protein goals. He notes liking PB2 as suggested last OV. He continues to eat lean meats 93% and above. Pt endorses possibly not eating the right portions. Pt informed me that his cardiologist recommended that he have blood work done sometime in August.   Review of Systems:  Pertinent positives were addressed with patient today.  Reviewed by clinician on day of visit: allergies, medications, problem list, medical history, surgical history, family history, social history, and previous encounter notes.  Weight Summary and Biometrics   Weight Lost Since Last Visit: 1lb  Weight Gained Since Last Visit: 0lb    Vitals Temp: 98.1 F (36.7 C) BP: 131/72 Pulse Rate: (!) 52 SpO2: 97 %   Anthropometric Measurements Height: 6\' 3"  (1.905 m) Weight: 276 lb (125.2 kg) BMI (Calculated): 34.5 Weight at Last Visit: 277lb Weight Lost Since Last Visit: 1lb Weight Gained Since Last Visit: 0lb Starting Weight: 274lb Total Weight Loss (lbs): 6 lb (2.722 kg)   Body Composition  Body Fat %: 33.3  % Fat Mass (lbs): 92 lbs Muscle Mass (lbs): 175.4 lbs Total Body Water (lbs): 125.6 lbs Visceral Fat Rating : 21   Other Clinical Data Fasting: no Labs: no Today's Visit #: 13 Starting Date: 01/27/22     Objective:   PHYSICAL EXAM: Blood pressure 131/72, pulse (!) 52, temperature 98.1 F (36.7 C), height 6\' 3"  (1.905 m), weight 276 lb (125.2 kg), SpO2 97%. Body mass index is 34.5 kg/m.  General: Well Developed, well nourished, and in no acute distress.  HEENT: Normocephalic, atraumatic Skin: Warm and dry, cap RF less 2 sec, good turgor Chest:  Normal excursion, shape, no gross abn Respiratory: speaking in full sentences, no conversational dyspnea NeuroM-Sk: Ambulates w/o assistance, moves * 4 Psych: A and O *3, insight good, mood-full  DIAGNOSTIC DATA REVIEWED:  BMET    Component Value Date/Time   NA 144 07/21/2022 0802   K 4.4 07/21/2022 0802   CL 104 07/21/2022 0802   CO2 23 07/21/2022 0802   GLUCOSE 91 07/21/2022 0802   GLUCOSE 86 04/01/2021 0813   BUN 21 07/21/2022 0802   CREATININE 1.35 (H) 07/21/2022 0802   CREATININE 1.34 (H) 10/31/2020 0946   CALCIUM 9.5 07/21/2022 0802   GFRNONAA 60 (L) 02/04/2021 0814   GFRNONAA 54 (L) 10/31/2020 1610  GFRAA 63 10/31/2020 0946   Lab Results  Component Value Date   HGBA1C 5.7 (H) 07/21/2022   HGBA1C 5.7 10/21/2018   Lab Results  Component Value Date   INSULIN 11.5 07/21/2022   INSULIN 12.6 01/27/2022   Lab Results  Component Value Date   TSH 2.560 07/21/2022   CBC    Component Value Date/Time   WBC 5.5 07/21/2022 0802   WBC 6.1 06/28/2018 0909   RBC 5.15 07/21/2022 0802   RBC 4.92 10/21/2018 0000   HGB 15.7 07/21/2022 0802   HCT 46.5 07/21/2022 0802   PLT 253 07/21/2022 0802   MCV 90 07/21/2022 0802   MCH 30.5 07/21/2022 0802   MCH 31.0 06/28/2018 0909   MCHC 33.8 07/21/2022 0802   MCHC 33.9 06/28/2018 0909   RDW 12.6 07/21/2022 0802   Iron Studies    Component Value Date/Time   IRON 86  07/21/2022 0802   TIBC 280 07/21/2022 0802   FERRITIN 228 07/21/2022 0802   IRONPCTSAT 31 07/21/2022 0802   Lipid Panel     Component Value Date/Time   CHOL 262 (H) 07/21/2022 0802   TRIG 134 07/21/2022 0802   HDL 48 07/21/2022 0802   CHOLHDL 6 04/01/2021 0813   VLDL 24.4 04/01/2021 0813   LDLCALC 190 (H) 07/21/2022 0802   LDLCALC 131 (H) 10/03/2019 0926   Hepatic Function Panel     Component Value Date/Time   PROT 7.1 07/21/2022 0802   ALBUMIN 4.5 07/21/2022 0802   AST 13 07/21/2022 0802   ALT 13 07/21/2022 0802   ALKPHOS 95 07/21/2022 0802   BILITOT 0.4 07/21/2022 0802      Component Value Date/Time   TSH 2.560 07/21/2022 0802   Nutritional Lab Results  Component Value Date   VD25OH 43.3 07/21/2022   VD25OH 39.9 01/27/2022   VD25OH 30.49 04/01/2021   Attestations:   I, Clinical biochemist, acting as a Stage manager for [employer], DO., have compiled all relevant documentation for today's office visit on behalf of Thomasene Lot, DO, while in the presence of [employer], DO.  I have reviewed the above documentation for accuracy and completeness, and I agree with the above. [name], D.O.  The 21st Century Cures Act was signed into law in 2016 which includes the topic of electronic health records.  This provides immediate access to information in MyChart.  This includes consultation notes, operative notes, office notes, lab results and pathology reports.  If you have any questions about what you read please let us know at your next visit so we can discuss your concerns and take corrective action if need be.  We are right here with you.

## 2022-12-09 DIAGNOSIS — M25562 Pain in left knee: Secondary | ICD-10-CM | POA: Diagnosis not present

## 2022-12-09 DIAGNOSIS — M7061 Trochanteric bursitis, right hip: Secondary | ICD-10-CM | POA: Diagnosis not present

## 2022-12-09 DIAGNOSIS — M25551 Pain in right hip: Secondary | ICD-10-CM | POA: Diagnosis not present

## 2022-12-09 DIAGNOSIS — Z96652 Presence of left artificial knee joint: Secondary | ICD-10-CM | POA: Diagnosis not present

## 2022-12-14 ENCOUNTER — Ambulatory Visit: Payer: Medicare Other

## 2022-12-28 ENCOUNTER — Ambulatory Visit: Payer: Medicare Other | Admitting: Nurse Practitioner

## 2022-12-28 ENCOUNTER — Encounter (INDEPENDENT_AMBULATORY_CARE_PROVIDER_SITE_OTHER): Payer: Self-pay | Admitting: Family Medicine

## 2022-12-29 ENCOUNTER — Encounter: Payer: Self-pay | Admitting: Internal Medicine

## 2022-12-29 ENCOUNTER — Ambulatory Visit (INDEPENDENT_AMBULATORY_CARE_PROVIDER_SITE_OTHER): Payer: Medicare Other | Admitting: Internal Medicine

## 2022-12-29 VITALS — BP 128/75 | HR 51 | Ht 75.0 in | Wt 275.4 lb

## 2022-12-29 DIAGNOSIS — E669 Obesity, unspecified: Secondary | ICD-10-CM

## 2022-12-29 DIAGNOSIS — I480 Paroxysmal atrial fibrillation: Secondary | ICD-10-CM | POA: Diagnosis not present

## 2022-12-29 DIAGNOSIS — I10 Essential (primary) hypertension: Secondary | ICD-10-CM

## 2022-12-29 DIAGNOSIS — E782 Mixed hyperlipidemia: Secondary | ICD-10-CM

## 2022-12-29 DIAGNOSIS — R7303 Prediabetes: Secondary | ICD-10-CM | POA: Diagnosis not present

## 2022-12-29 DIAGNOSIS — K219 Gastro-esophageal reflux disease without esophagitis: Secondary | ICD-10-CM

## 2022-12-29 DIAGNOSIS — C61 Malignant neoplasm of prostate: Secondary | ICD-10-CM

## 2022-12-29 DIAGNOSIS — I2510 Atherosclerotic heart disease of native coronary artery without angina pectoris: Secondary | ICD-10-CM

## 2022-12-29 DIAGNOSIS — N1831 Chronic kidney disease, stage 3a: Secondary | ICD-10-CM

## 2022-12-29 DIAGNOSIS — N400 Enlarged prostate without lower urinary tract symptoms: Secondary | ICD-10-CM

## 2022-12-29 DIAGNOSIS — Z6834 Body mass index (BMI) 34.0-34.9, adult: Secondary | ICD-10-CM

## 2022-12-29 NOTE — Progress Notes (Signed)
New Patient Office Visit  Subjective    [name], male    DOB: 1956-05-07  Age: 67 y.o. MRN: [id number]  CC:  Chief Complaint  Patient presents with   Establish Care    HPI Jonathan Dyer presents to establish care.  He is a 67 year old male who endorses a past medical history significant for paroxysmal A-fib, CAD, HTN, prediabetes, prostate cancer, obesity, HLD, GERD, BPH, CKD, and left knee OA.  Previously followed by Dr. Neva Seat.  Jonathan Dyer feeling well today.  He is asymptomatic currently and has no acute concerns to discuss aside from desiring to establish care.  He currently works part-time for a Nurse, mental health.  He denies a history of tobacco, alcohol, and illicit drug use.  He endorses a family medical history significant for CAD, lung cancer, and diabetes mellitus.  Chronic medical conditions and outstanding preventative care items discussed today are individually addressed in A/P below.   Outpatient Encounter Medications as of 12/29/2022  Medication Sig   amLODipine (NORVASC) 10 MG tablet TAKE 1 TABLET BY MOUTH DAILY   aspirin EC 325 MG tablet Take 325 mg by mouth daily.   chlorthalidone (HYGROTON) 25 MG tablet Take 0.5 tablets (12.5 mg total) by mouth daily as needed.   Evolocumab (REPATHA SURECLICK) 140 MG/ML SOAJ Inject 140 mg into the skin every 14 (fourteen) days.   flecainide (TAMBOCOR) 50 MG tablet TAKE 1 TABLET BY MOUTH TWICE  DAILY   furosemide (LASIX) 40 MG tablet TAKE 1 TABLET BY MOUTH DAILY AS  NEEDED FOR FLUID   losartan (COZAAR) 100 MG tablet TAKE 1 TABLET BY MOUTH DAILY   Multiple Vitamin (MULTIVITAMIN) tablet Take 1 tablet by mouth daily.   omeprazole (PRILOSEC) 20 MG capsule TAKE 1 CAPSULE BY MOUTH DAILY AS NEEDED FOR ACID REFLUX,  INDIGESTION   potassium chloride (KLOR-CON) 10 MEQ tablet Take 1 tablet (10 mEq total) by mouth daily as needed.   tamsulosin (FLOMAX) 0.4 MG CAPS capsule Take 0.4 mg by mouth. Takes  occasionally.   Coenzyme Q10 (COQ10) 100 MG CAPS Take 1 capsule by mouth daily. (Patient not taking: Reported on 12/29/2022)   No facility-administered encounter medications on file as of 12/29/2022.    Past Medical History:  Diagnosis Date   Allergy    Arthritis    Atrial fibrillation (HCC)    Diagnosed 09/2009, on flecainide   Back pain    Bilateral swelling of feet    Bradycardia    Event monitor 2010: HR down to 45, asymptomatic.   Cataract    Coronary atherosclerosis of native coronary artery    Nonobstructive and distal disease by cath 2005.    Essential hypertension    Hyperlipidemia    Joint pain    OSA (obstructive sleep apnea)    Osteoarthritis    Overweight    Prediabetes    Renal cyst    Ringing in ears    Sleep apnea    Vitamin D deficiency     Past Surgical History:  Procedure Laterality Date   BIOPSY  01/04/2018   Procedure: BIOPSY;  Surgeon: West Bali, MD;  Location: [facility];  Service: Endoscopy;;  ascending colon    CARDIAC CATHETERIZATION  2005   COLONOSCOPY  05/2006   SLF: 3 mm sigmoid: Tubular adenoma removed.   COLONOSCOPY N/A 05/06/2016   Dr. Darrick Penna: two sessile polyps in mid transverse colon and hepatic flexure (tubular adenomas). ext/int hemorrhoids. tortuous left colon.  COLONOSCOPY WITH PROPOFOL N/A 01/04/2018   Procedure: COLONOSCOPY WITH PROPOFOL;  Surgeon: West Bali, MD;  Location: [facility];  Service: Endoscopy;  Laterality: N/A;  2:30pm   KNEE ARTHROSCOPY     Left anterior cruciate ligament reconstruction     left shoulder surgery     dr . Molly Maduro collins    POLYPECTOMY  05/06/2016   Procedure: POLYPECTOMY;  Surgeon: West Bali, MD;  Location: [facility];  Service: Endoscopy;;  transverse colon polyp, hepatic flexure polyp,    Right shoulder surgery     ROBOT ASSISTED LAPAROSCOPIC NEPHRECTOMY Left 07/04/2018   Procedure: XI ROBOTIC ASSISTED LAPAROSCOPIC RENAL CYST DECORTICATION;  Surgeon: Malen Gauze, MD;   Location: [facility];  Service: Urology;  Laterality: Left;  3 HRSPROCEDURE: ROBOTIC RENAL CYST DECORTICATION   TONSILLECTOMY     TOTAL KNEE ARTHROPLASTY      Family History  Problem Relation Age of Onset   Diabetes Mother    Pulmonary embolism Mother    Obesity Mother    Cancer Father    Hypertension Father    Lung disease Father    Heart disease Father    Arthritis Sister    Hyperlipidemia Brother    Hypertension Brother    Diabetes Other        Significant family h/o DM   Colon cancer Neg Hx     Social History   Socioeconomic History   Marital status: Divorced    Spouse name: Not on file   Number of children: 3   Years of education: Not on file   Highest education level: Not on file  Occupational History   Occupation: PT-custodial work   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   Social Determinants of Health   Financial Resource Strain: Not on file  Food Insecurity: Not on file  Transportation Needs: Not on file  Physical Activity: Not on file  Stress: Not on file  Social Connections: Not on file  Intimate Partner Violence: Not on file   Review of Systems  Constitutional:  Negative for chills and fever.  HENT:  Negative for sore throat.   Respiratory:  Negative for cough and shortness of breath.   Cardiovascular:  Negative for chest pain, palpitations and leg swelling.  Gastrointestinal:  Negative for abdominal pain, blood in stool, constipation, diarrhea, nausea and vomiting.  Genitourinary:  Negative for dysuria and hematuria.   Musculoskeletal:  Negative for myalgias.  Skin:  Negative for itching and rash.  Neurological:  Negative for dizziness and headaches.  Psychiatric/Behavioral:  Negative for depression and suicidal ideas.    Objective    BP 128/75   Pulse (!) 51   Ht 6\' 3"  (1.905 m)   Wt 275 lb 6.4 oz (124.9 kg)   SpO2 95%   BMI 34.42 kg/m   Physical Exam Vitals reviewed.  Constitutional:      General: He is not in acute distress.    Appearance: Normal appearance. He is obese. He is not ill-appearing.  HENT:  Head: Normocephalic and atraumatic.     Right Ear: External ear normal.     Left Ear: External ear normal.     Nose: Nose normal. No congestion or rhinorrhea.     Mouth/Throat:     Mouth: Mucous membranes are moist.     Pharynx: Oropharynx is clear.  Eyes:     General: No scleral icterus.    Extraocular Movements: Extraocular movements intact.     Conjunctiva/sclera: Conjunctivae normal.     Pupils: Pupils are equal, round, and reactive to light.  Cardiovascular:     Rate and Rhythm: Normal rate and regular rhythm.     Pulses: Normal pulses.     Heart sounds: Normal heart sounds. No murmur heard. Pulmonary:     Effort: Pulmonary effort is normal.     Breath sounds: Normal breath sounds. No wheezing, rhonchi or rales.  Abdominal:     General: Abdomen is flat. Bowel sounds are normal. There is no distension.     Palpations: Abdomen is soft.     Tenderness: There is no abdominal tenderness.  Musculoskeletal:        General: No swelling or deformity. Normal range of motion.     Cervical back: Normal range of motion.  Skin:    General: Skin is warm and dry.     Capillary Refill: Capillary refill takes less than 2 seconds.  Neurological:     General: No focal deficit present.     Mental Status: He is alert and oriented to person, place, and time.     Motor: No weakness.  Psychiatric:        Mood and Affect: Mood normal.        Behavior: Behavior normal.        Thought  Content: Thought content normal.   Last CBC Lab Results  Component Value Date   WBC 5.5 07/21/2022   HGB 15.7 07/21/2022   HCT 46.5 07/21/2022   MCV 90 07/21/2022   MCH 30.5 07/21/2022   RDW 12.6 07/21/2022   PLT 253 07/21/2022   Last metabolic panel Lab Results  Component Value Date   GLUCOSE 91 07/21/2022   NA 144 07/21/2022   K 4.4 07/21/2022   CL 104 07/21/2022   CO2 23 07/21/2022   BUN 21 07/21/2022   CREATININE 1.35 (H) 07/21/2022   EGFR 57 (L) 07/21/2022   CALCIUM 9.5 07/21/2022   PROT 7.1 07/21/2022   ALBUMIN 4.5 07/21/2022   LABGLOB 2.6 07/21/2022   AGRATIO 1.7 07/21/2022   BILITOT 0.4 07/21/2022   ALKPHOS 95 07/21/2022   AST 13 07/21/2022   ALT 13 07/21/2022   ANIONGAP 7 02/04/2021   Last lipids Lab Results  Component Value Date   CHOL 262 (H) 07/21/2022   HDL 48 07/21/2022   LDLCALC 190 (H) 07/21/2022   TRIG 134 07/21/2022   CHOLHDL 6 04/01/2021   Last hemoglobin A1c Lab Results  Component Value Date   HGBA1C 5.7 (H) 07/21/2022   Last thyroid functions Lab Results  Component Value Date   TSH 2.560 07/21/2022   T3TOTAL 117 01/27/2022   T4TOTAL 6.9 10/03/2019   Last vitamin D Lab Results  Component Value Date   VD25OH 43.3 07/21/2022   Last vitamin B12 and Folate Lab Results  Component Value Date   VITAMINB12 580 07/21/2022   FOLATE 6.7 07/21/2022   Assessment & Plan:   Problem List Items Addressed This Visit       CAD (coronary artery disease), native  coronary artery    History of nonobstructive CAD.  He is currently on Repatha and ASA 325 mg daily.      PAROXYSMAL ATRIAL FIBRILLATION    Regular rate and rhythm detected on exam today.  He is currently prescribed flecainide.  Not on OAC.  He will undergo Watchman next month (9/26).      Essential hypertension    Current antihypertensive regimen consists of amlodipine 10 mg daily, losartan 100 mg daily, and chlorthalidone 12.5 mg daily.  He Dyer only taking chlorthalidone  occasionally if his blood pressure is elevated and he appreciates lower extremity edema.  BP today is 128/75.  No medication changes are indicated at this time.      Gastroesophageal reflux disease without esophagitis    Symptoms are adequately controlled with as needed use of omeprazole.  No changes are indicated today.      Prostate cancer (HCC)    By urology (Dr. Marlou Porch).  He is currently under annual surveillance.      BPH (benign prostatic hyperplasia)    Symptoms are adequately controlled with twice weekly use of Flomax.  No changes are indicated today.      CKD stage 3a, GFR 45-59 ml/min [facility]) - Primary    Labs from March 2024 in November 2023 are suggestive of CKD 3A.  Currently on ARB.  Repeat BMP ordered today.      Hyperlipidemia, mixed    Lipid panel last updated in March 2024.  Total cholesterol 226 and LDL 190.  He has started Repatha since this result.  History of statin intolerance.  Repeat lipid panel is pending.      Prediabetes    A1c 5.7 on most recent labs.  He is focused on lifestyle modifications aimed at weight loss and improving his blood sugar.  He is followed at [facility].      Class 1 obesity with serious comorbidity and body mass index (BMI) of 34.0 to 34.9 in adult    BMI 34.4.  He is focused on lifestyle modifications aimed at weight loss.  He is followed by [facility].       Return in about 6 months (around 07/01/2023).   Billie Lade, MD

## 2022-12-29 NOTE — Assessment & Plan Note (Signed)
A1c 5.7 on most recent labs.  He is focused on lifestyle modifications aimed at weight loss and improving his blood sugar.  He is followed at [facility].

## 2022-12-29 NOTE — Assessment & Plan Note (Signed)
BMI 34.4.  He is focused on lifestyle modifications aimed at weight loss.  He is followed by [facility] weight [facility] wellness.

## 2022-12-29 NOTE — Assessment & Plan Note (Signed)
Symptoms are adequately controlled with twice weekly use of Flomax.  No changes are indicated today.

## 2022-12-29 NOTE — Assessment & Plan Note (Signed)
Current antihypertensive regimen consists of amlodipine 10 mg daily, losartan 100 mg daily, and chlorthalidone 12.5 mg daily.  He reports only taking chlorthalidone occasionally if his blood pressure is elevated and he appreciates lower extremity edema.  BP today is 128/75.  No medication changes are indicated at this time.

## 2022-12-29 NOTE — Assessment & Plan Note (Signed)
Symptoms are adequately controlled with as needed use of omeprazole.  No changes are indicated today.

## 2022-12-29 NOTE — Assessment & Plan Note (Addendum)
By urology (Dr. Marlou Porch).  He is currently under annual surveillance.

## 2022-12-29 NOTE — Assessment & Plan Note (Signed)
Labs from March 2024 in November 2023 are suggestive of CKD 3A.  Currently on ARB.  Repeat BMP ordered today.

## 2022-12-29 NOTE — Patient Instructions (Addendum)
It was a pleasure to see you today.  Thank you for giving [place] the opportunity to be involved in your care.  Below is a brief recap of your visit and next steps.  We will plan to see you again in 6 months.  Summary You have established care today Repeat chemistry panel ordered We will plan for follow up in 6 months    Schedule your Medicare Annual Wellness Visit at checkout.

## 2022-12-29 NOTE — Assessment & Plan Note (Signed)
Regular rate and rhythm detected on exam today.  He is currently prescribed flecainide.  Not on OAC.  He will undergo Watchman next month (9/26).

## 2022-12-29 NOTE — Assessment & Plan Note (Signed)
Lipid panel last updated in March 2024.  Total cholesterol 226 and LDL 190.  He has started Repatha since this result.  History of statin intolerance.  Repeat lipid panel is pending.

## 2022-12-29 NOTE — Assessment & Plan Note (Signed)
History of nonobstructive CAD.  He is currently on Repatha and ASA 325 mg daily.

## 2023-01-04 ENCOUNTER — Encounter: Payer: Medicare Other | Admitting: Family Medicine

## 2023-01-05 ENCOUNTER — Ambulatory Visit (INDEPENDENT_AMBULATORY_CARE_PROVIDER_SITE_OTHER): Payer: Medicare Other | Admitting: Family Medicine

## 2023-01-05 ENCOUNTER — Ambulatory Visit (HOSPITAL_BASED_OUTPATIENT_CLINIC_OR_DEPARTMENT_OTHER): Payer: Medicare Other | Admitting: Internal Medicine

## 2023-01-05 ENCOUNTER — Encounter (INDEPENDENT_AMBULATORY_CARE_PROVIDER_SITE_OTHER): Payer: Self-pay | Admitting: Family Medicine

## 2023-01-05 VITALS — BP 117/78 | HR 56 | Temp 98.4°F | Ht 75.0 in | Wt 269.0 lb

## 2023-01-05 DIAGNOSIS — Z6833 Body mass index (BMI) 33.0-33.9, adult: Secondary | ICD-10-CM

## 2023-01-05 DIAGNOSIS — E669 Obesity, unspecified: Secondary | ICD-10-CM

## 2023-01-05 DIAGNOSIS — I10 Essential (primary) hypertension: Secondary | ICD-10-CM | POA: Diagnosis not present

## 2023-01-05 DIAGNOSIS — E782 Mixed hyperlipidemia: Secondary | ICD-10-CM | POA: Diagnosis not present

## 2023-01-05 DIAGNOSIS — E559 Vitamin D deficiency, unspecified: Secondary | ICD-10-CM | POA: Diagnosis not present

## 2023-01-05 DIAGNOSIS — R7303 Prediabetes: Secondary | ICD-10-CM

## 2023-01-05 DIAGNOSIS — E668 Other obesity: Secondary | ICD-10-CM

## 2023-01-05 NOTE — Progress Notes (Signed)
Jonathan Dyer, D.O.  ABFM, ABOM Specializing in Clinical Bariatric Medicine  Office located at: [address] [place], [place]  [address]     Assessment and Plan:   Orders Placed This Encounter  Procedures   Hemoglobin A1c   Insulin, random   Lipid panel   Comprehensive metabolic panel   VITAMIN D 25 Hydroxy (Vit-D Deficiency, Fractures)    Prediabetes Assessment & Plan: No current meds. Diet/exercise approach. Hunger and cravings are pretty well controlled when following his prudent nutritional plan.   Lab Results  Component Value Date   HGBA1C 5.7 (H) 07/21/2022   HGBA1C 6.0 (H) 01/27/2022   HGBA1C 5.5 10/03/2019   INSULIN 11.5 07/21/2022   INSULIN 12.6 01/27/2022   Recheck A1c & Fasting Insulin today. Continue his prudent nutritional plan that is low in simple carbohydrates, saturated fats and trans fats to goal of 5-10% weight loss to achieve significant health benefits.  Pt encouraged to continually advance exercise and cardiovascular fitness as tolerated throughout weight loss journey.    Hyperlipidemia, mixed Assessment & Plan: Hyperlipidemia treated with Evolocumab 140 mg every 14 days. Reports not taking Coenzyme Q10.   Lab Results  Component Value Date   CHOL 262 (H) 07/21/2022   HDL 48 07/21/2022   LDLCALC 190 (H) 07/21/2022   TRIG 134 07/21/2022   CHOLHDL 6 04/01/2021   Will recheck lipid panel today. Continue with Evolocumab per Dr.McDowell & our treatment plan of a heart-heathy, low cholesterol meal plan.    Essential hypertension Assessment & Plan: Condition treated with Norvasc 10 mg daily, Tambocor 50 mg bid, Cozaar 100 mg daily and Lasix & Chlorthalidone prn. Blood pressure stable today, no concerns.   Last 3 blood pressure readings in our office are as follows: BP Readings from Last 3 Encounters:  01/05/23 117/78  12/29/22 128/75  12/07/22 131/72   C/w all medications above per specialist. Continue with Prudent nutritional plan  and low sodium diet, advance exercise as tolerated. Will continue to monitor condition alongside PCP/specialists.    Vitamin D deficiency Assessment & Plan: Reports sporadically taking an unknown dose of OTC Vitamin D.   Lab Results  Component Value Date   VD25OH 43.3 07/21/2022   VD25OH 39.9 01/27/2022   Pt instructed to inform us of the vit D dose he is taking at next OV. Will recheck levels today.    TREATMENT PLAN FOR OBESITY: Class 1 obesity with serious comorbidity and body mass index (BMI) of 34.0 to 34.9 in adult, unspecified obesity type Assessment & Plan: Jonathan Dyer is here to discuss his progress with his obesity treatment plan along with follow-up of his obesity related diagnoses. See Medical Weight Management Flowsheet for complete bioelectrical impedance results.  Since last office visit on 12/07/22 patient's muscle mass has decreased by 5.6 lb. Fat mass has decreased by 0.8 lb. Total body water has decreased by 5.4 lb.  Counseling done on how various foods will affect these numbers and how to maximize success  Total lbs lost to date: 7 lbs  Total weight loss percentage to date: 2.54%   Meal Plan: Begin journaling 1250-1350 calories and 95+ grams of protein with the Category 3 meal plan as a guide.   Behavioral Intervention Additional resources provided today:  food journaling log Evidence-based interventions for health behavior change were utilized today including the discussion of self monitoring techniques, problem-solving barriers and SMART goal setting techniques.   Regarding patient's less desirable eating habits and patterns, we  employed the technique of small changes.  Pt will specifically work on: journaling intake/bringing in food log for next visit.    FOLLOW UP: Return in about 4 weeks (around 02/02/2023). He was informed of the importance of frequent follow up visits to maximize his success with intensive lifestyle modifications for his multiple  health conditions.  Jonathan Dyer is aware that we will review all of his lab results at our next visit.  He is aware that if anything is critical/ life threatening with the results, we will be contacting him via MyChart prior to the office visit to discuss management.   Subjective:   Chief complaint: Obesity Jonathan Dyer is here to discuss his progress with his obesity treatment plan. He is on  the Category 3 Plan and states he is following his eating plan approximately 95% of the time. He states he is walking 25 minutes 2-3 days per wk and weights 60 minutes, 1 day a wk.   Interval History:  Jonathan Dyer is here for a follow up office visit. Since last OV, [name] reports dealing with knee pain and has a f/up with his orthopedist. He did document his weight and the foods he ate (not the actual totals). Feels that he is doing well with his water intake. No complaints of hunger/cravings  today.   Review of Systems:  Pertinent positives were addressed with patient today.  Reviewed by clinician on day of visit: allergies, medications, problem list, medical history, surgical history, family history, social history, and previous encounter notes.  Weight Summary and Biometrics   Weight Lost Since Last Visit: 7lb  Weight Gained Since Last Visit: 0   Vitals Temp: 98.4 F (36.9 C) BP: 117/78 Pulse Rate: (!) 56 SpO2: 96 %   Anthropometric Measurements Height: 6\' 3"  (1.905 m) Weight: 269 lb (122 kg) BMI (Calculated): 33.62 Weight at Last Visit: 276lb Weight Lost Since Last Visit: 7lb Weight Gained Since Last Visit: 0 Starting Weight: 274lb Total Weight Loss (lbs): 5 lb (2.268 kg)   Body Composition  Body Fat %: 33.8 % Fat Mass (lbs): 91.2 lbs Muscle Mass (lbs): 169.8 lbs Total Body Water (lbs): 120.2 lbs Visceral Fat Rating : 21   Other Clinical Data Fasting: no Labs: no Today's Visit #: 14 Starting Date: 01/27/22   Objective:   PHYSICAL EXAM: Blood pressure  117/78, pulse (!) 56, temperature 98.4 F (36.9 C), height 6\' 3"  (1.905 m), weight 269 lb (122 kg), SpO2 96%. Body mass index is 33.62 kg/m.  General: Well Developed, well nourished, and in no acute distress.  HEENT: Normocephalic, atraumatic Skin: Warm and dry, cap RF less 2 sec, good turgor Chest:  Normal excursion, shape, no gross abn Respiratory: speaking in full sentences, no conversational dyspnea NeuroM-Sk: Ambulates w/o assistance, moves * 4 Psych: A and O *3, insight good, mood-full  DIAGNOSTIC DATA REVIEWED:  BMET    Component Value Date/Time   NA 144 07/21/2022 0802   K 4.4 07/21/2022 0802   CL 104 07/21/2022 0802   CO2 23 07/21/2022 0802   GLUCOSE 91 07/21/2022 0802   GLUCOSE 86 04/01/2021 0813   BUN 21 07/21/2022 0802   CREATININE 1.35 (H) 07/21/2022 0802   CREATININE 1.34 (H) 10/31/2020 0946   CALCIUM 9.5 07/21/2022 0802   GFRNONAA 60 (L) 02/04/2021 0814   GFRNONAA 54 (L) 10/31/2020 0946   GFRAA 63 10/31/2020 0946   Lab Results  Component Value Date   HGBA1C 5.7 (H) 07/21/2022   HGBA1C 5.7 10/21/2018  Lab Results  Component Value Date   INSULIN 11.5 07/21/2022   INSULIN 12.6 01/27/2022   Lab Results  Component Value Date   TSH 2.560 07/21/2022   CBC    Component Value Date/Time   WBC 5.5 07/21/2022 0802   WBC 6.1 06/28/2018 0909   RBC 5.15 07/21/2022 0802   RBC 4.92 10/21/2018 0000   HGB 15.7 07/21/2022 0802   HCT 46.5 07/21/2022 0802   PLT 253 07/21/2022 0802   MCV 90 07/21/2022 0802   MCH 30.5 07/21/2022 0802   MCH 31.0 06/28/2018 0909   MCHC 33.8 07/21/2022 0802   MCHC 33.9 06/28/2018 0909   RDW 12.6 07/21/2022 0802   Iron Studies    Component Value Date/Time   IRON 86 07/21/2022 0802   TIBC 280 07/21/2022 0802   FERRITIN 228 07/21/2022 0802   IRONPCTSAT 31 07/21/2022 0802   Lipid Panel     Component Value Date/Time   CHOL 262 (H) 07/21/2022 0802   TRIG 134 07/21/2022 0802   HDL 48 07/21/2022 0802   CHOLHDL 6 04/01/2021  0813   VLDL 24.4 04/01/2021 0813   LDLCALC 190 (H) 07/21/2022 0802   LDLCALC 131 (H) 10/03/2019 0926   Hepatic Function Panel     Component Value Date/Time   PROT 7.1 07/21/2022 0802   ALBUMIN 4.5 07/21/2022 0802   AST 13 07/21/2022 0802   ALT 13 07/21/2022 0802   ALKPHOS 95 07/21/2022 0802   BILITOT 0.4 07/21/2022 0802      Component Value Date/Time   TSH 2.560 07/21/2022 0802   Nutritional Lab Results  Component Value Date   VD25OH 43.3 07/21/2022   VD25OH 39.9 01/27/2022   VD25OH 30.49 04/01/2021    Attestations:   Patient was in the office today and time spent on visit including pre-visit chart review and post-visit care/coordination of care and electronic medical record documentation was 40 minutes. 50% of that time was in face to face counseling of this patient's medical condition(s) and providing education on treatment options to always include the first-line treatment of diet and lifestyle modification.   I, Special Randolm Idol, acting as a Stage manager for [employer], DO., have compiled all relevant documentation for today's office visit on behalf of Thomasene Lot, DO, while in the presence of [employer], DO.  I have reviewed the above documentation for accuracy and completeness, and I agree with the above. Jonathan Dyer, D.O.  The 21st Century Cures Act was signed into law in 2016 which includes the topic of electronic health records.  This provides immediate access to information in MyChart.  This includes consultation notes, operative notes, office notes, lab results and pathology reports.  If you have any questions about what you read please let us know at your next visit so we can discuss your concerns and take corrective action if need be.  We are right here with you.

## 2023-01-06 LAB — COMPREHENSIVE METABOLIC PANEL
ALT: 18 IU/L (ref 0–44)
AST: 17 IU/L (ref 0–40)
Albumin: 4.4 g/dL (ref 3.9–4.9)
Alkaline Phosphatase: 94 IU/L (ref 44–121)
BUN/Creatinine Ratio: 13 (ref 10–24)
BUN: 17 mg/dL (ref 8–27)
Bilirubin Total: 0.6 mg/dL (ref 0.0–1.2)
CO2: 24 mmol/L (ref 20–29)
Calcium: 9.4 mg/dL (ref 8.6–10.2)
Chloride: 102 mmol/L (ref 96–106)
Creatinine, Ser: 1.3 mg/dL — ABNORMAL HIGH (ref 0.76–1.27)
Globulin, Total: 2.7 g/dL (ref 1.5–4.5)
Glucose: 89 mg/dL (ref 70–99)
Potassium: 4.5 mmol/L (ref 3.5–5.2)
Sodium: 142 mmol/L (ref 134–144)
Total Protein: 7.1 g/dL (ref 6.0–8.5)
eGFR: 59 mL/min/{1.73_m2} — ABNORMAL LOW (ref >59)

## 2023-01-06 LAB — LIPID PANEL
Chol/HDL Ratio: 3.7 ratio (ref 0.0–5.0)
Cholesterol, Total: 161 mg/dL (ref 100–199)
HDL: 43 mg/dL (ref >39)
LDL Chol Calc (NIH): 94 mg/dL (ref 0–99)
Triglycerides: 133 mg/dL (ref 0–149)
VLDL Cholesterol Cal: 24 mg/dL (ref 5–40)

## 2023-01-06 LAB — INSULIN, RANDOM: INSULIN: 9.1 u[IU]/mL (ref 2.6–24.9)

## 2023-01-06 LAB — HEMOGLOBIN A1C
Est. average glucose Bld gHb Est-mCnc: 111 mg/dL
Hgb A1c MFr Bld: 5.5 % (ref 4.8–5.6)

## 2023-01-06 LAB — VITAMIN D 25 HYDROXY (VIT D DEFICIENCY, FRACTURES): Vit D, 25-Hydroxy: 42.9 ng/mL (ref 30.0–100.0)

## 2023-01-07 DIAGNOSIS — I480 Paroxysmal atrial fibrillation: Secondary | ICD-10-CM

## 2023-01-12 ENCOUNTER — Encounter (INDEPENDENT_AMBULATORY_CARE_PROVIDER_SITE_OTHER): Payer: Self-pay | Admitting: Family Medicine

## 2023-01-12 ENCOUNTER — Telehealth: Payer: Self-pay

## 2023-01-12 ENCOUNTER — Other Ambulatory Visit (HOSPITAL_COMMUNITY): Payer: Self-pay

## 2023-01-12 NOTE — Telephone Encounter (Signed)
Pharmacy Patient Advocate Encounter   Received notification from CoverMyMeds that prior authorization for REPATHA is required/requested.   Insurance verification completed.   The patient is insured through [facility] .   Per test claim: PA required; PA submitted to [facility] via CoverMyMeds Key/confirmation #/EOC [facility]  Status is pending

## 2023-01-13 ENCOUNTER — Other Ambulatory Visit (HOSPITAL_COMMUNITY): Payer: Self-pay

## 2023-01-13 NOTE — Telephone Encounter (Signed)
Pharmacy Patient Advocate Encounter   Received notification from CoverMyMeds that prior authorization for REPATHA is required/requested.   Insurance verification completed.   The patient is insured through [facility] .   Per test claim: APPROVED from 1.1.24 to 12.31.24. Ran test claim, Copay is $47.00 for 26month. This test claim was processed through [facility]- copay amounts may vary at other pharmacies due to pharmacy/plan contracts, or as the patient moves through the different stages of their insurance plan.

## 2023-01-15 NOTE — Progress Notes (Unsigned)
[facility]                                     Cardiology Office Note:    Date:  01/15/2023   ID:  Carron Curie, DOB 1955/06/04, MRN [id number]  PCP:  Billie Lade, MD  [facility] Cardiologist:  Nona Dell, MD  [facility] Electrophysiologist:  Lanier Prude, MD   Referring MD: Shade Flood, MD   No chief complaint on file. ***  History of Present Illness:    Jonathan Dyer is a 67 y.o. male with a hx of paroxsymal atrial fibrillation, HLD with hx of statin intolerance, HTN, and dilated aortic aneurysm who was referred to Dr. Lalla Brothers for the evaluation of LAAO closure.   Mr. Poisson is followed by Dr. Diona Browner for his cardiology care. He is very active at baseline and spoke to Dr. Diona Browner about coming off his anticoagulation. At referral he was felt to be a good candidate based on cardiac CT which showed anatomy suitable to proceed however also an elevated calcium score at 5250. He underwent stress testing 08/03/2022 that showed no evidence of ischemia and felt low risk.   Today    Atrial fibrillation: Well controlled with flecainide however may need to transition given elevated coronary calcium score of 5250. LAAO 02/04/23 at 930. CBC, BMET, EKG, soap   Coronary calcifications on CT:    Dilated Aortic aneurysm: Measures 44mm on recent CT. Continue to follow closely with surveillance imaging.   HLD: Continue repatha   HTN:       Past Medical History:  Diagnosis Date   Allergy    Arthritis    Atrial fibrillation (HCC)    Diagnosed 09/2009, on flecainide   Back pain    Bilateral swelling of feet    Bradycardia    Event monitor 2010: HR down to 45, asymptomatic.   Cataract    Coronary atherosclerosis of native coronary artery    Nonobstructive and distal disease by cath 2005.    Essential hypertension    Hyperlipidemia    Joint pain    OSA (obstructive sleep apnea)    Osteoarthritis    Overweight     Prediabetes    Renal cyst    Ringing in ears    Sleep apnea    Vitamin D deficiency     Past Surgical History:  Procedure Laterality Date   BIOPSY  01/04/2018   Procedure: BIOPSY;  Surgeon: West Bali, MD;  [facility]: [facility];  Service: Endoscopy;;  ascending colon    CARDIAC CATHETERIZATION  2005   COLONOSCOPY  05/2006   SLF: 3 mm sigmoid: Tubular adenoma removed.   COLONOSCOPY N/A 05/06/2016   Dr. Darrick Penna: two sessile polyps in mid transverse colon and hepatic flexure (tubular adenomas). ext/int hemorrhoids. tortuous left colon.    COLONOSCOPY WITH PROPOFOL N/A 01/04/2018   Procedure: COLONOSCOPY WITH PROPOFOL;  Surgeon: West Bali, MD;  Location: [facility];  Service: Endoscopy;  Laterality: N/A;  2:30pm   KNEE ARTHROSCOPY     Left anterior cruciate ligament reconstruction     left shoulder surgery     dr . Molly Maduro collins    POLYPECTOMY  05/06/2016   Procedure: POLYPECTOMY;  Surgeon: West Bali, MD;  Location: [facility];  Service: Endoscopy;;  transverse colon polyp, hepatic flexure polyp,    Right shoulder surgery  ROBOT ASSISTED LAPAROSCOPIC NEPHRECTOMY Left 07/04/2018   Procedure: XI ROBOTIC ASSISTED LAPAROSCOPIC RENAL CYST DECORTICATION;  Surgeon: Malen Gauze, MD;  Location: [facility];  Service: Urology;  Laterality: Left;  3 HRSPROCEDURE: ROBOTIC RENAL CYST DECORTICATION   TONSILLECTOMY     TOTAL KNEE ARTHROPLASTY      Current Medications: No outpatient medications have been marked as taking for the 01/18/23 encounter (Appointment) with [facility].     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   Social Determinants of Health   Financial Resource Strain: Not on file  Food Insecurity: Not on file  Transportation Needs: Not on file  Physical Activity: Not on file  Stress: Not on file  Social Connections: Not on file     Family History: The patient's ***family history includes Arthritis in his sister; Cancer in his father; Diabetes in his mother and another family member; Heart disease in his father; Hyperlipidemia in his brother; Hypertension in his brother and father; Lung disease in his father; Obesity in his mother; Pulmonary embolism in his mother. There is no history of Colon cancer.  ROS:   Please see the history of present illness.    All other systems reviewed and are negative.  EKGs/Labs/Other Studies Reviewed:    The following studies were reviewed today:   Cardiac Studies & Procedures     STRESS TESTS  NM MYOCAR MULTI W/SPECT W 08/03/2022  Narrative   Findings are consistent with no ischemia. The study is low risk.   No ST deviation was noted. The ECG was negative for ischemia.  Low risk Duke treadmill score of 9.   LV perfusion is equivocal.  Small, mild intensity, mid to apical inferolateral defect that is predominantly fixed with partial reversibility suggestive of variable soft tissue attenuation artifact.  Importantly, no large ischemic territories are noted.   Left ventricular function is normal. Nuclear stress EF: 53 %.  Low risk study with Duke treadmill score of 9, no exercise-induced  arrhythmias, and perfusion imaging most consistent with variable soft tissue attenuation.  No large ischemic territories are noted.  LVEF 53%.   ECHOCARDIOGRAM  ECHOCARDIOGRAM COMPLETE 04/21/2022  Narrative ECHOCARDIOGRAM REPORT    Patient Name:   ALBON BOGDANSKI Date of Exam: 04/21/2022 Medical Rec #:  [id number]           Height:       75.0 in Accession #:    [id number]          Weight:       270.0 lb Date of Birth:  [birth year]           BSA:  2.493 m Patient Age:    66 years            BP:           132/70 mmHg Patient Gender: M                   HR:           52 bpm. Exam Location:  [place]  Procedure: 2D Echo, Cardiac Doppler, Color Doppler and Strain Analysis  Indications:    Dx: Paroxysmal atrial fibrillation (HCC) [I48.0 (ICD-10-CM)]; Pre-op evaluation [W09.811 (ICD-10-CM)]  History:        Patient has prior history of Echocardiogram examinations, most recent 11/18/2016. CAD, Arrythmias:Atrial Fibrillation, Signs/Symptoms:Bacteremia; Risk Factors:Non-Smoker, Sleep Apnea, Hypertension and Dyslipidemia.  Sonographer:    Jake Seats RDMS, RVT, RDCS Referring Phys: [id number] Rossie Muskrat LAMBERT  IMPRESSIONS   1. Left ventricular ejection fraction, by estimation, is 55 to 60%. The left ventricle has normal function. The left ventricle has no regional wall motion abnormalities. Left ventricular diastolic parameters are indeterminate. The average left ventricular global longitudinal strain is -22.7 %. The global longitudinal strain is normal. 2. Right ventricular systolic function not well visualized but appears to be mildly reduced. The right ventricular size is mildly enlarged. 3. The mitral valve is grossly normal. Mild mitral valve regurgitation. No evidence of mitral stenosis. 4. The aortic valve is tricuspid. There is mild calcification of the aortic valve. Aortic valve regurgitation is not visualized. No aortic stenosis is present. 5. Aortic dilatation noted.  There is mild dilatation of the aortic root, measuring 44 mm. There is mild dilatation of the ascending aorta, measuring 44 mm. There is mild dilatation of the aortic arch, measuring 40 mm.  Comparison(s): Prior images unable to be directly viewed, comparison made by report only. Changes from prior study are noted. Stable LVEF. Aortic dilatation progressed compared to prior Echo from 2018.  FINDINGS Left Ventricle: Left ventricular ejection fraction, by estimation, is 55 to 60%. The left ventricle has normal function. The left ventricle has no regional wall motion abnormalities. The average left ventricular global longitudinal strain is -22.7 %. The global longitudinal strain is normal. The left ventricular internal cavity size was normal in size. There is no left ventricular hypertrophy. Left ventricular diastolic parameters are indeterminate.  Right Ventricle: The right ventricular size is mildly enlarged. Right vetricular wall thickness was not assessed. Right ventricular systolic function not well visualized but appears to be mildly reduced.  Left Atrium: Left atrial size was normal in size.  Right Atrium: Right atrial size was normal in size.  Pericardium: There is no evidence of pericardial effusion.  Mitral Valve: The mitral valve is grossly normal. Mild mitral annular calcification. Mild mitral valve regurgitation. No evidence of mitral valve stenosis.  Tricuspid Valve: The tricuspid valve is grossly normal. Tricuspid valve regurgitation is trivial. No evidence of tricuspid stenosis.  Aortic Valve: The aortic valve is tricuspid. There is mild calcification of the aortic valve. Aortic valve regurgitation is not visualized. No aortic stenosis is present. Aortic valve peak gradient measures 7.6 mmHg.  Pulmonic Valve: The pulmonic valve was grossly normal. Pulmonic valve regurgitation is trivial. No evidence of pulmonic stenosis.  Aorta: Aortic dilatation noted. There is mild dilatation  of the aortic root, measuring 44 mm. There is mild dilatation of the ascending aorta, measuring 44 mm. There is mild dilatation of the aortic arch, measuring 40 mm.  Venous: The inferior vena cava was not well visualized.  IAS/Shunts: No  atrial level shunt detected by color flow Doppler.   LEFT VENTRICLE PLAX 2D LVIDd:         4.80 cm      Diastology LVIDs:         3.50 cm      LV e' medial:    5.44 cm/s LV PW:         1.10 cm      LV E/e' medial:  10.2 LV IVS:        0.90 cm      LV e' lateral:   10.60 cm/s LVOT diam:     2.00 cm      LV E/e' lateral: 5.3 LV SV:         66 LV SV Index:   27           2D Longitudinal Strain LVOT Area:     3.14 cm     2D Strain GLS (A2C):   -21.8 % 2D Strain GLS (A3C):   -23.0 % 2D Strain GLS (A4C):   -23.2 % LV Volumes (MOD)            2D Strain GLS Avg:     -22.7 % LV vol d, MOD A2C: 174.0 ml LV vol d, MOD A4C: 183.0 ml LV vol s, MOD A2C: 72.4 ml LV vol s, MOD A4C: 72.2 ml LV SV MOD A2C:     101.6 ml LV SV MOD A4C:     183.0 ml LV SV MOD BP:      110.5 ml  RIGHT VENTRICLE RV S prime:     7.40 cm/s TAPSE (M-mode): 1.4 cm  LEFT ATRIUM             Index        RIGHT ATRIUM           Index LA diam:        3.30 cm 1.32 cm/m   RA Area:     15.90 cm LA Vol (A2C):   63.1 ml 25.31 ml/m  RA Volume:   44.10 ml  17.69 ml/m LA Vol (A4C):   38.2 ml 15.32 ml/m LA Biplane Vol: 50.5 ml 20.26 ml/m AORTIC VALVE AV Area (Vmax): 2.30 cm AV Vmax:        138.00 cm/s AV Peak Grad:   7.6 mmHg LVOT Vmax:      101.00 cm/s LVOT Vmean:     65.600 cm/s LVOT VTI:       0.211 m  AORTA Ao Root diam: 4.40 cm Ao Asc diam:  4.40 cm  MITRAL VALVE               TRICUSPID VALVE MV Area (PHT): 2.42 cm    TR Peak grad:   13.1 mmHg MV Decel Time: 314 msec    TR Vmax:        181.00 cm/s MR Peak grad: 33.8 mmHg MR Vmax:      290.50 cm/s  SHUNTS MV E velocity: 55.70 cm/s  Systemic VTI:  0.21 m MV A velocity: 47.10 cm/s  Systemic Diam: 2.00 cm MV E/A ratio:   1.18  Vishnu Priya Mallipeddi Electronically signed by Winfield Rast Mallipeddi Signature Date/Time: 04/21/2022/2:58:34 PM    Final    MONITORS  LONG TERM MONITOR (3-14 DAYS) 09/26/2020  Narrative ZIO XT reviewed.  6 days 21 hours analyzed.  Predominant rhythm is sinus with heart rate ranging from 45 bpm up to 133 bpm and average heart rate 59 bpm.  There were rare PACs including couplets and triplets representing less than 1% total beats.  There were rare PVCs representing less than 1% total beats.  There was one brief episode of SVT lasting only 6 beats, otherwise no significant arrhythmias or pauses.            EKG:  EKG is *** ordered today.  The ekg ordered today demonstrates ***  Recent Labs: 07/21/2022: Hemoglobin 15.7; Magnesium 2.1; Platelets 253; TSH 2.560 01/05/2023: ALT 18; BUN 17; Creatinine, Ser 1.30; Potassium 4.5; Sodium 142  Recent Lipid Panel    Component Value Date/Time   CHOL 161 01/05/2023 1114   TRIG 133 01/05/2023 1114   HDL 43 01/05/2023 1114   CHOLHDL 3.7 01/05/2023 1114   CHOLHDL 6 04/01/2021 0813   VLDL 24.4 04/01/2021 0813   LDLCALC 94 01/05/2023 1114   LDLCALC 131 (H) 10/03/2019 0926    Risk Assessment/Calculations:    HAS-BLED score 2 Hypertension Yes  Abnormal renal and liver function (Dialysis, transplant, Cr >2.26 mg/dL /Cirrhosis or Bilirubin >2x Normal or AST/ALT/AP >3x Normal) No  Stroke No  Bleeding No  Labile INR (Unstable/high INR) No  Elderly (>65) Yes  Drugs or alcohol (? 8 drinks/week, anti-plt or NSAID) No    CHA2DS2-VASc Score = 4  The patient's score is based upon: CHF History: 0 HTN History: 1 Diabetes History: 1 Stroke History: 0 Vascular Disease History: 1 Age Score: 1 Gender Score: 0  Physical Exam:    VS:  There were no vitals taken for this visit.    Wt Readings from Last 3 Encounters:  01/05/23 269 lb (122 kg)  12/29/22 275 lb 6.4 oz (124.9 kg)  12/07/22 276 lb (125.2 kg)     GEN: *** Well nourished,  well developed in no acute distress HEENT: Normal NECK: No JVD; No carotid bruits LYMPHATICS: No lymphadenopathy CARDIAC: ***RRR, no murmurs, rubs, gallops RESPIRATORY:  Clear to auscultation without rales, wheezing or rhonchi  ABDOMEN: Soft, non-tender, non-distended MUSCULOSKELETAL:  No edema; No deformity  SKIN: Warm and dry NEUROLOGIC:  Alert and oriented x 3 PSYCHIATRIC:  Normal affect   ASSESSMENT:    No diagnosis found. PLAN:    In order of problems listed above:       {Are you ordering a CV Procedure (e.g. stress test, cath, DCCV, TEE, etc)?   Press F2        :[id number]}    Medication Adjustments/Labs and Tests Ordered: Current medicines are reviewed at length with the patient today.  Concerns regarding medicines are outlined above.  No orders of the defined types were placed in this encounter.  No orders of the defined types were placed in this encounter.   There are no Patient Instructions on file for this visit.   Signed, [name], NP  01/15/2023 9:38 AM    [facility]

## 2023-01-18 ENCOUNTER — Ambulatory Visit: Payer: Medicare Other | Attending: Cardiology | Admitting: Cardiology

## 2023-01-18 VITALS — BP 128/82 | HR 51 | Ht 75.0 in | Wt 273.2 lb

## 2023-01-18 DIAGNOSIS — E782 Mixed hyperlipidemia: Secondary | ICD-10-CM | POA: Diagnosis not present

## 2023-01-18 DIAGNOSIS — Z01812 Encounter for preprocedural laboratory examination: Secondary | ICD-10-CM | POA: Diagnosis not present

## 2023-01-18 DIAGNOSIS — I10 Essential (primary) hypertension: Secondary | ICD-10-CM

## 2023-01-18 DIAGNOSIS — I4819 Other persistent atrial fibrillation: Secondary | ICD-10-CM | POA: Diagnosis not present

## 2023-01-18 DIAGNOSIS — I2510 Atherosclerotic heart disease of native coronary artery without angina pectoris: Secondary | ICD-10-CM | POA: Diagnosis not present

## 2023-01-18 DIAGNOSIS — I2584 Coronary atherosclerosis due to calcified coronary lesion: Secondary | ICD-10-CM | POA: Diagnosis not present

## 2023-01-18 MED ORDER — APIXABAN 5 MG PO TABS: 5.0000 mg | ORAL_TABLET | Freq: Two times a day (BID) | ORAL | Status: DC

## 2023-01-18 NOTE — Patient Instructions (Addendum)
Medication Instructions:  Your physician has recommended you make the following change in your medication:  1-STOP Aspirin on 01/27/23 2-START Eliquis 5 mg by mouth twice daily on 01/28/23  *If you need a refill on your cardiac medications before your next appointment, please call your pharmacy*   Lab Work: Your physician recommends that you have lab work today- BMET and CBC  If you have labs (blood work) drawn today and your tests are completely normal, you will receive your results only by: [employer] (if you have MyChart) OR A paper copy in the mail If you have any lab test that is abnormal or we need to change your treatment, we will call you to review the results.  Follow-Up: At [facility], you and your health needs are our priority.  As part of our continuing mission to provide you with exceptional heart care, we have created designated Provider Care Teams.  These Care Teams include your primary Cardiologist (physician) and Advanced Practice Providers (APPs -  Physician Assistants and Nurse Practitioners) who all work together to provide you with the care you need, when you need it.  We recommend signing up for the patient portal called "MyChart".  Sign up information is provided on this After Visit Summary.  MyChart is used to connect with patients for Virtual Visits (Telemedicine).  Patients are able to view lab/test results, encounter notes, upcoming appointments, etc.  Non-urgent messages can be sent to your provider as well.   To learn more about what you can do with MyChart, go to [email or web address].    Your next appointment:   Will be arranged after procedure  Provider:   [name], NP

## 2023-01-19 LAB — BASIC METABOLIC PANEL
BUN/Creatinine Ratio: 13 (ref 10–24)
BUN: 15 mg/dL (ref 8–27)
CO2: 26 mmol/L (ref 20–29)
Calcium: 9.4 mg/dL (ref 8.6–10.2)
Chloride: 103 mmol/L (ref 96–106)
Creatinine, Ser: 1.17 mg/dL (ref 0.76–1.27)
Glucose: 83 mg/dL (ref 70–99)
Potassium: 4.4 mmol/L (ref 3.5–5.2)
Sodium: 143 mmol/L (ref 134–144)
eGFR: 67 mL/min/{1.73_m2} (ref >59)

## 2023-01-19 LAB — CBC WITH DIFFERENTIAL/PLATELET
Basophils Absolute: 0 10*3/uL (ref 0.0–0.2)
Basos: 1 %
EOS (ABSOLUTE): 0.2 10*3/uL (ref 0.0–0.4)
Eos: 4 %
Hematocrit: 47.4 % (ref 37.5–51.0)
Hemoglobin: 15.6 g/dL (ref 13.0–17.7)
Immature Grans (Abs): 0 10*3/uL (ref 0.0–0.1)
Immature Granulocytes: 0 %
Lymphocytes Absolute: 1.4 10*3/uL (ref 0.7–3.1)
Lymphs: 24 %
MCH: 29.7 pg (ref 26.6–33.0)
MCHC: 32.9 g/dL (ref 31.5–35.7)
MCV: 90 fL (ref 79–97)
Monocytes Absolute: 0.5 10*3/uL (ref 0.1–0.9)
Monocytes: 8 %
Neutrophils Absolute: 3.7 10*3/uL (ref 1.4–7.0)
Neutrophils: 63 %
Platelets: 285 10*3/uL (ref 150–450)
RBC: 5.25 x10E6/uL (ref 4.14–5.80)
RDW: 12.5 % (ref 11.6–15.4)
WBC: 5.9 10*3/uL (ref 3.4–10.8)

## 2023-02-02 ENCOUNTER — Telehealth: Payer: Self-pay

## 2023-02-02 NOTE — Telephone Encounter (Signed)
Called to confirm procedure details for 02/04/2023 Watchman implant.  Left message to call back.

## 2023-02-03 ENCOUNTER — Encounter (INDEPENDENT_AMBULATORY_CARE_PROVIDER_SITE_OTHER): Payer: Self-pay | Admitting: Family Medicine

## 2023-02-03 ENCOUNTER — Ambulatory Visit (INDEPENDENT_AMBULATORY_CARE_PROVIDER_SITE_OTHER): Payer: Medicare Other | Admitting: Family Medicine

## 2023-02-03 VITALS — BP 113/72 | HR 62 | Temp 97.8°F | Ht 75.0 in | Wt 266.0 lb

## 2023-02-03 DIAGNOSIS — R5383 Other fatigue: Secondary | ICD-10-CM

## 2023-02-03 DIAGNOSIS — I10 Essential (primary) hypertension: Secondary | ICD-10-CM | POA: Diagnosis not present

## 2023-02-03 DIAGNOSIS — I517 Cardiomegaly: Secondary | ICD-10-CM

## 2023-02-03 DIAGNOSIS — E669 Obesity, unspecified: Secondary | ICD-10-CM

## 2023-02-03 DIAGNOSIS — Z6833 Body mass index (BMI) 33.0-33.9, adult: Secondary | ICD-10-CM

## 2023-02-03 DIAGNOSIS — E559 Vitamin D deficiency, unspecified: Secondary | ICD-10-CM | POA: Diagnosis not present

## 2023-02-03 MED ORDER — VITAMIN D (ERGOCALCIFEROL) 1.25 MG (50000 UNIT) PO CAPS
50000.0000 [IU] | ORAL_CAPSULE | ORAL | 0 refills | Status: DC
Start: 2023-02-03 — End: 2023-03-09

## 2023-02-03 NOTE — Telephone Encounter (Signed)
Left message to call back  

## 2023-02-03 NOTE — Progress Notes (Addendum)
PCP - Billie Lade, MD Cardiologist - Jonelle Sidle, MD Electrophysiology - Cardiology - Lanier Prude, MD  PPM/ICD -  Device Orders -  Rep Notified -   Chest x-ray -  EKG - 01-18-23 Stress Test - 01-03-19 ECHO - 04-21-22 Cardiac Cath -   CPAP -   GLP-1 -  Fasting Blood Sugar -  Checks Blood Sugar /day  Blood Thinner Instructions: Follow instructions given to you by cardiology Aspirin Instructions:   ERAS Protcol -   COVID TEST-   Anesthesia review:  Patient verbally denies any shortness of breath, fever, cough and chest pain during phone call   -------------  SDW INSTRUCTIONS given:  Your procedure is scheduled on Sept. 26, 2024.  Report to [facility] Main Entrance "A" at 7:00 A.M., and check in at the Admitting office.  Call this number if you have problems the morning of surgery:  [phone]   Remember:  Do not eat or drink after midnight the night before your surgery      Take these medicines the morning of surgery with A SIP OF WATER As directed by cardiology  As of today, STOP taking any Aspirin (unless otherwise instructed by your surgeon) Aleve, Naproxen, Ibuprofen, Motrin, Advil, Goody's, BC's, all herbal medications, fish oil, and all vitamins.                      Do not wear jewelry, make up, or nail polish            Do not wear lotions, powders, perfumes/colognes, or deodorant.            Do not shave 48 hours prior to surgery.  Men may shave face and neck.            Do not bring valuables to the hospital.            [facility] is not responsible for any belongings or valuables.  Do NOT Smoke (Tobacco/Vaping) 24 hours prior to your procedure If you use a CPAP at night, you may bring all equipment for your overnight stay.   Contacts, glasses, dentures or bridgework may not be worn into surgery.      For patients admitted to the hospital, discharge time will be determined by your treatment team.   Patients discharged the day of  surgery will not be allowed to drive home, and someone needs to stay with them for 24 hours.    Special instructions:   [place]- Preparing For Surgery  Before surgery, you can play an important role. Because skin is not sterile, your skin needs to be as free of germs as possible. You can reduce the number of germs on your skin by washing with CHG (chlorahexidine gluconate) Soap before surgery.  CHG is an antiseptic cleaner which kills germs and bonds with the skin to continue killing germs even after washing.    Oral Hygiene is also important to reduce your risk of infection.  Remember - BRUSH YOUR TEETH THE MORNING OF SURGERY WITH YOUR REGULAR TOOTHPASTE  Please do not use if you have an allergy to CHG or antibacterial soaps. If your skin becomes reddened/irritated stop using the CHG.  Do not shave (including legs and underarms) for at least 48 hours prior to first CHG shower. It is OK to shave your face.  Please follow these instructions carefully.   Shower the NIGHT BEFORE SURGERY and the MORNING OF SURGERY with DIAL Soap.  Pat yourself dry with a CLEAN TOWEL.  Wear CLEAN PAJAMAS to bed the night before surgery  Place CLEAN SHEETS on your bed the night of your first shower and DO NOT SLEEP WITH PETS.   Day of Surgery: Please shower morning of surgery  Wear Clean/Comfortable clothing the morning of surgery Do not apply any deodorants/lotions.   Remember to brush your teeth WITH YOUR REGULAR TOOTHPASTE.   Questions were answered. Patient verbalized understanding of instructions.

## 2023-02-03 NOTE — Progress Notes (Unsigned)
Chief Complaint:   OBESITY Jonathan Dyer is here to discuss his progress with his obesity treatment plan along with follow-up of his obesity related diagnoses. Ryu is on the Category 3 Plan and states he is following his eating plan approximately 75-80% of the time. Kasin states he is walking, lifting weights, and biking for 60 minutes 3-4 times per week.  Today's visit was #: 15 Starting weight: 274 lbs Starting date: 01/27/2022 Today's weight: 266 lbs Today's date: 02/03/2023 Total lbs lost to date: 8 Total lbs lost since last in-office visit: 3  Interim History: Patient continues to do well with his weight loss.  He notes some carbohydrate cravings, but he is working on portion controlling his snacking.  Subjective:   1. Essential hypertension Patient's blood pressure is controlled on his medications and with his diet.  He denies chest pain, and he is working on his diet and exercise.  2. Fatigue Patient notes increased fatigue after starting Eliquis.  He is hoping he will be able to stop this 6 months after his procedure.  3. Vitamin D deficiency Patient is not on vitamin D, and his level is worsening and is below goal.  I discussed labs with the patient today.  4. Left ventricular hypertrophy Patient is getting a new watchman implanted tomorrow.  He continues to work on a healthy diet and exercise to improve his cardiac function and overall health.  Assessment/Plan:   1. Essential hypertension Patient will continue with his diet, exercise, and medications, and we will continue to follow.  2. Fatigue Patient was reassured that he is very likely to improve his fatigue, and we will follow closely.  3. Vitamin D deficiency Patient agreed to restart prescription vitamin D 50,000 IU once weekly, and we will refill with a 90-day supply.  - Vitamin D, Ergocalciferol, (DRISDOL) 1.25 MG (50000 UNIT) CAPS capsule; Take 1 capsule (50,000 Units total) by mouth every 7 (seven) days.   Dispense: 13 capsule; Refill: 0  4. Left ventricular hypertrophy EKG and echocardiogram results were reviewed with the patient today.  5. BMI 33.0-33.9,adult  6. Obesity, Beginning BMI 34.25 Jonathan Dyer is currently in the action stage of change. As such, his goal is to continue with weight loss efforts. He has agreed to the Category 3 Plan.   Patient was shown how to use Chat GPT to help with meal planning.  Exercise goals: As is.   Behavioral modification strategies: no skipping meals and meal planning and cooking strategies.  Julen has agreed to follow-up with our clinic in 4 weeks. He was informed of the importance of frequent follow-up visits to maximize his success with intensive lifestyle modifications for his multiple health conditions.   Objective:   Blood pressure 113/72, pulse 62, temperature 97.8 F (36.6 C), height 6\' 3"  (1.905 m), weight 266 lb (120.7 kg), SpO2 90%. Body mass index is 33.25 kg/m.  Lab Results  Component Value Date   CREATININE 1.17 01/18/2023   BUN 15 01/18/2023   NA 143 01/18/2023   K 4.4 01/18/2023   CL 103 01/18/2023   CO2 26 01/18/2023   Lab Results  Component Value Date   ALT 18 01/05/2023   AST 17 01/05/2023   ALKPHOS 94 01/05/2023   BILITOT 0.6 01/05/2023   Lab Results  Component Value Date   HGBA1C 5.5 01/05/2023   HGBA1C 5.7 (H) 07/21/2022   HGBA1C 6.0 (H) 01/27/2022   HGBA1C 5.5 10/03/2019   HGBA1C 5.7 10/21/2018   Lab Results  Component Value Date   INSULIN 9.1 01/05/2023   INSULIN 11.5 07/21/2022   INSULIN 12.6 01/27/2022   Lab Results  Component Value Date   TSH 2.560 07/21/2022   Lab Results  Component Value Date   CHOL 161 01/05/2023   HDL 43 01/05/2023   LDLCALC 94 01/05/2023   TRIG 133 01/05/2023   CHOLHDL 3.7 01/05/2023   Lab Results  Component Value Date   VD25OH 42.9 01/05/2023   VD25OH 43.3 07/21/2022   VD25OH 39.9 01/27/2022   Lab Results  Component Value Date   WBC 5.9 01/18/2023   HGB 15.6  01/18/2023   HCT 47.4 01/18/2023   MCV 90 01/18/2023   PLT 285 01/18/2023   Lab Results  Component Value Date   IRON 86 07/21/2022   TIBC 280 07/21/2022   FERRITIN 228 07/21/2022   Attestation Statements:   Reviewed by clinician on day of visit: allergies, medications, problem list, medical history, surgical history, family history, social history, and previous encounter notes.  Time spent on visit including pre-visit chart review and post-visit care and charting was 40 minutes.   I, Burt Knack, am acting as transcriptionist for Quillian Quince, MD.  I have reviewed the above documentation for accuracy and completeness, and I agree with the above. -  Quillian Quince, MD

## 2023-02-04 ENCOUNTER — Encounter (HOSPITAL_COMMUNITY): Payer: Self-pay | Admitting: Cardiology

## 2023-02-04 ENCOUNTER — Inpatient Hospital Stay (HOSPITAL_COMMUNITY): Payer: Medicare Other | Admitting: Physician Assistant

## 2023-02-04 ENCOUNTER — Inpatient Hospital Stay (HOSPITAL_COMMUNITY): Payer: Medicare Other

## 2023-02-04 ENCOUNTER — Other Ambulatory Visit: Payer: Self-pay | Admitting: Cardiology

## 2023-02-04 ENCOUNTER — Encounter: Payer: Self-pay | Admitting: Cardiology

## 2023-02-04 ENCOUNTER — Inpatient Hospital Stay (HOSPITAL_COMMUNITY)
Admission: RE | Disposition: A | Discharge status: Home / Self Care | Payer: Medicare Other | Source: Ambulatory Visit | Attending: Cardiology

## 2023-02-04 ENCOUNTER — Other Ambulatory Visit: Payer: Self-pay

## 2023-02-04 ENCOUNTER — Inpatient Hospital Stay (HOSPITAL_COMMUNITY)
Admission: RE | Admit: 2023-02-04 | Discharge: 2023-02-04 | DRG: 274 | Disposition: A | Discharge status: Home / Self Care | Payer: Medicare Other | Source: Ambulatory Visit | Attending: Cardiology | Admitting: Cardiology

## 2023-02-04 DIAGNOSIS — Z006 Encounter for examination for normal comparison and control in clinical research program: Secondary | ICD-10-CM

## 2023-02-04 DIAGNOSIS — I7121 Aneurysm of the ascending aorta, without rupture: Secondary | ICD-10-CM | POA: Diagnosis present

## 2023-02-04 DIAGNOSIS — Z79899 Other long term (current) drug therapy: Secondary | ICD-10-CM | POA: Diagnosis not present

## 2023-02-04 DIAGNOSIS — Z95818 Presence of other cardiac implants and grafts: Secondary | ICD-10-CM

## 2023-02-04 DIAGNOSIS — Z887 Allergy status to serum and vaccine status: Secondary | ICD-10-CM | POA: Diagnosis not present

## 2023-02-04 DIAGNOSIS — M1990 Unspecified osteoarthritis, unspecified site: Secondary | ICD-10-CM | POA: Diagnosis present

## 2023-02-04 DIAGNOSIS — Z881 Allergy status to other antibiotic agents status: Secondary | ICD-10-CM

## 2023-02-04 DIAGNOSIS — I129 Hypertensive chronic kidney disease with stage 1 through stage 4 chronic kidney disease, or unspecified chronic kidney disease: Secondary | ICD-10-CM | POA: Diagnosis not present

## 2023-02-04 DIAGNOSIS — I2510 Atherosclerotic heart disease of native coronary artery without angina pectoris: Secondary | ICD-10-CM | POA: Diagnosis present

## 2023-02-04 DIAGNOSIS — Z905 Acquired absence of kidney: Secondary | ICD-10-CM

## 2023-02-04 DIAGNOSIS — G4733 Obstructive sleep apnea (adult) (pediatric): Secondary | ICD-10-CM | POA: Diagnosis present

## 2023-02-04 DIAGNOSIS — I4819 Other persistent atrial fibrillation: Principal | ICD-10-CM | POA: Diagnosis present

## 2023-02-04 DIAGNOSIS — Z01818 Encounter for other preprocedural examination: Secondary | ICD-10-CM | POA: Diagnosis not present

## 2023-02-04 DIAGNOSIS — R931 Abnormal findings on diagnostic imaging of heart and coronary circulation: Secondary | ICD-10-CM | POA: Diagnosis present

## 2023-02-04 DIAGNOSIS — Z8261 Family history of arthritis: Secondary | ICD-10-CM

## 2023-02-04 DIAGNOSIS — Z880 Allergy status to penicillin: Secondary | ICD-10-CM

## 2023-02-04 DIAGNOSIS — N1831 Chronic kidney disease, stage 3a: Secondary | ICD-10-CM | POA: Diagnosis not present

## 2023-02-04 DIAGNOSIS — I480 Paroxysmal atrial fibrillation: Secondary | ICD-10-CM | POA: Diagnosis not present

## 2023-02-04 DIAGNOSIS — E559 Vitamin D deficiency, unspecified: Secondary | ICD-10-CM | POA: Diagnosis present

## 2023-02-04 DIAGNOSIS — Z7901 Long term (current) use of anticoagulants: Secondary | ICD-10-CM | POA: Diagnosis not present

## 2023-02-04 DIAGNOSIS — E782 Mixed hyperlipidemia: Secondary | ICD-10-CM | POA: Diagnosis present

## 2023-02-04 DIAGNOSIS — I10 Essential (primary) hypertension: Secondary | ICD-10-CM | POA: Diagnosis present

## 2023-02-04 DIAGNOSIS — E785 Hyperlipidemia, unspecified: Secondary | ICD-10-CM | POA: Diagnosis present

## 2023-02-04 DIAGNOSIS — I4891 Unspecified atrial fibrillation: Secondary | ICD-10-CM

## 2023-02-04 DIAGNOSIS — Z8249 Family history of ischemic heart disease and other diseases of the circulatory system: Secondary | ICD-10-CM

## 2023-02-04 HISTORY — DX: Presence of other cardiac implants and grafts: Z95.818

## 2023-02-04 HISTORY — PX: LEFT ATRIAL APPENDAGE OCCLUSION: EP1229

## 2023-02-04 HISTORY — PX: TEE WITHOUT CARDIOVERSION: SHX5443

## 2023-02-04 LAB — TYPE AND SCREEN
ABO/RH(D): A POS
Antibody Screen: NEGATIVE

## 2023-02-04 LAB — ECHO TEE

## 2023-02-04 LAB — POCT ACTIVATED CLOTTING TIME: Activated Clotting Time: 348 seconds

## 2023-02-04 LAB — SURGICAL PCR SCREEN
MRSA, PCR: NEGATIVE
Staphylococcus aureus: NEGATIVE

## 2023-02-04 SURGERY — LEFT ATRIAL APPENDAGE OCCLUSION
Anesthesia: General

## 2023-02-04 MED ORDER — LACTATED RINGERS IV SOLN: INTRAVENOUS | Status: DC

## 2023-02-04 MED ORDER — ONDANSETRON HCL 4 MG/2ML IJ SOLN: 4.0000 mg | Freq: Four times a day (QID) | INTRAMUSCULAR | Status: DC | PRN

## 2023-02-04 MED ORDER — ACETAMINOPHEN 325 MG PO TABS: 650.0000 mg | ORAL_TABLET | ORAL | Status: DC | PRN

## 2023-02-04 MED ORDER — PROTAMINE SULFATE 10 MG/ML IV SOLN
INTRAVENOUS | Status: DC | PRN
Start: 2023-02-04 — End: 2023-02-04
  Administered 2023-02-04: 25 mg via INTRAVENOUS
  Administered 2023-02-04: 10 mg via INTRAVENOUS

## 2023-02-04 MED ORDER — DEXAMETHASONE SODIUM PHOSPHATE 10 MG/ML IJ SOLN
INTRAMUSCULAR | Status: DC | PRN
  Administered 2023-02-04: 4 mg via INTRAVENOUS

## 2023-02-04 MED ORDER — CHLORHEXIDINE GLUCONATE 0.12 % MT SOLN
15.0000 mL | Freq: Once | OROMUCOSAL | Status: AC
  Administered 2023-02-04: 15 mL via OROMUCOSAL
  Filled 2023-02-04: qty 15

## 2023-02-04 MED ORDER — CHLORHEXIDINE GLUCONATE 4 % EX LIQD
Freq: Once | CUTANEOUS | Status: DC
  Filled 2023-02-04: qty 15

## 2023-02-04 MED ORDER — PHENYLEPHRINE HCL-NACL 20-0.9 MG/250ML-% IV SOLN
INTRAVENOUS | Status: DC | PRN
  Administered 2023-02-04: 35 ug/min via INTRAVENOUS

## 2023-02-04 MED ORDER — PROPOFOL 10 MG/ML IV BOLUS
INTRAVENOUS | Status: DC | PRN
  Administered 2023-02-04: 100 mg via INTRAVENOUS
  Administered 2023-02-04 (×2): 50 mg via INTRAVENOUS

## 2023-02-04 MED ORDER — SODIUM CHLORIDE 0.9% FLUSH: 3.0000 mL | INTRAVENOUS | Status: DC | PRN

## 2023-02-04 MED ORDER — VANCOMYCIN HCL 1500 MG/300ML IV SOLN
1500.0000 mg | INTRAVENOUS | Status: AC
  Administered 2023-02-04: 1500 mg via INTRAVENOUS
  Filled 2023-02-04: qty 300

## 2023-02-04 MED ORDER — ONDANSETRON HCL 4 MG/2ML IJ SOLN
INTRAMUSCULAR | Status: DC | PRN
  Administered 2023-02-04: 4 mg via INTRAVENOUS

## 2023-02-04 MED ORDER — HEPARIN (PORCINE) IN NACL 2000-0.9 UNIT/L-% IV SOLN
INTRAVENOUS | Status: DC | PRN
  Administered 2023-02-04: 1000 mL

## 2023-02-04 MED ORDER — SODIUM CHLORIDE 0.9 % IV SOLN: INTRAVENOUS | Status: DC

## 2023-02-04 MED ORDER — HEPARIN SODIUM (PORCINE) 1000 UNIT/ML IJ SOLN
INTRAMUSCULAR | Status: DC | PRN
Start: 2023-02-04 — End: 2023-02-04
  Administered 2023-02-04: 18000 [IU] via INTRAVENOUS

## 2023-02-04 MED ORDER — FENTANYL CITRATE (PF) 250 MCG/5ML IJ SOLN
INTRAMUSCULAR | Status: DC | PRN
  Administered 2023-02-04: 100 ug via INTRAVENOUS

## 2023-02-04 MED ORDER — SODIUM CHLORIDE 0.9% FLUSH: 3.0000 mL | Freq: Two times a day (BID) | INTRAVENOUS | Status: DC

## 2023-02-04 MED ORDER — SUGAMMADEX SODIUM 200 MG/2ML IV SOLN
INTRAVENOUS | Status: DC | PRN
  Administered 2023-02-04: 200 mg via INTRAVENOUS

## 2023-02-04 MED ORDER — SODIUM CHLORIDE 0.9 % IV SOLN: 250.0000 mL | INTRAVENOUS | Status: DC | PRN

## 2023-02-04 MED ORDER — FENTANYL CITRATE (PF) 100 MCG/2ML IJ SOLN
INTRAMUSCULAR | Status: AC
  Filled 2023-02-04: qty 2

## 2023-02-04 MED ORDER — HEPARIN (PORCINE) IN NACL 1000-0.9 UT/500ML-% IV SOLN
INTRAVENOUS | Status: DC | PRN
  Administered 2023-02-04: 500 mL

## 2023-02-04 MED ORDER — LIDOCAINE 2% (20 MG/ML) 5 ML SYRINGE
INTRAMUSCULAR | Status: DC | PRN
  Administered 2023-02-04: 20 mg via INTRAVENOUS

## 2023-02-04 MED ORDER — ROCURONIUM BROMIDE 10 MG/ML (PF) SYRINGE
PREFILLED_SYRINGE | INTRAVENOUS | Status: DC | PRN
  Administered 2023-02-04: 70 mg via INTRAVENOUS

## 2023-02-04 MED ORDER — ACETAMINOPHEN 500 MG PO TABS
1000.0000 mg | ORAL_TABLET | Freq: Once | ORAL | Status: AC
  Administered 2023-02-04: 1000 mg via ORAL
  Filled 2023-02-04: qty 2

## 2023-02-04 SURGICAL SUPPLY — 18 items
CATH INFINITI 5FR ANG PIGTAIL (CATHETERS) ×1 IMPLANT
CATH WATCHMAN STEER ACCESS SYS (CATHETERS) ×1 IMPLANT
CLOSURE PERCLOSE PROSTYLE (VASCULAR PRODUCTS) ×2 IMPLANT
DEVICE WATCHMAN FLX PRO PROC (KITS) IMPLANT
DEVICE WATCHMAN TRUSTEER PROC (KITS) IMPLANT
DILATOR VESSEL 38 20CM 12FR (INTRODUCER) ×1 IMPLANT
KIT SHEA VERSACROSS LAAC CONNE (KITS) ×1 IMPLANT
PACK CARDIAC CATHETERIZATION (CUSTOM PROCEDURE TRAY) ×2 IMPLANT
PAD DEFIB RADIO PHYSIO CONN (PAD) ×2 IMPLANT
SHEATH PERFORMER 18FRX30 (VASCULAR PRODUCTS) ×1 IMPLANT
SHEATH PINNACLE 8F 10CM (SHEATH) ×1 IMPLANT
SHEATH PROBE COVER 6X72 (BAG) ×1 IMPLANT
SYR CONTROL 10ML ANGIOGRAPHIC (SYRINGE) ×1 IMPLANT
TRANSDUCER W/STOPCOCK (MISCELLANEOUS) ×2 IMPLANT
TUBING CIL FLEX 10 FLL-RA (TUBING) ×1 IMPLANT
WATCHMAN FLX PRO 27 (Prosthesis & Implant Heart) ×1 IMPLANT
WATCHMAN FLX PRO PROCEDURE (KITS) ×1 IMPLANT
WATCHMAN TRUSTEER PROCEDURE (KITS) ×1 IMPLANT

## 2023-02-04 NOTE — Anesthesia Procedure Notes (Signed)
Procedure Name: Intubation Date/Time: 02/04/2023 9:37 AM  Performed by: Ayesha Rumpf, CRNAPre-anesthesia Checklist: Patient identified, Emergency Drugs available, Suction available and Patient being monitored Patient Re-evaluated:Patient Re-evaluated prior to induction Oxygen Delivery Method: Circle System Utilized Preoxygenation: Pre-oxygenation with 100% oxygen Induction Type: IV induction Ventilation: Mask ventilation with difficulty, Two handed mask ventilation required and Oral airway inserted - appropriate to patient size Laryngoscope Size: Glidescope and 4 Grade View: Grade I Tube type: Oral Tube size: 7.5 mm Number of attempts: 1 Airway Equipment and Method: Stylet and Oral airway Placement Confirmation: ETT inserted through vocal cords under direct vision, positive ETCO2 and breath sounds checked- equal and bilateral Secured at: 24 cm Tube secured with: Tape Dental Injury: Teeth and Oropharynx as per pre-operative assessment  Difficulty Due To: Difficulty was anticipated and Difficult Airway- due to anterior larynx Comments: Two provider mask ventilation with OAW.  Glidescope GO2 used for video laryngoscopy.

## 2023-02-04 NOTE — Transfer of Care (Signed)
Immediate Anesthesia Transfer of Care Note  Patient: Jonathan Dyer  Procedure(s) Performed: LEFT ATRIAL APPENDAGE OCCLUSION TRANSESOPHAGEAL ECHOCARDIOGRAM  Patient Location: PACU and Cath Lab  Anesthesia Type:General  Level of Consciousness: awake, drowsy, patient cooperative, and responds to stimulation  Airway & Oxygen Therapy: Patient Spontanous Breathing and Patient connected to face mask oxygen  Post-op Assessment: Report given to RN and Post -op Vital signs reviewed and stable  Post vital signs: Reviewed and stable  Last Vitals:  Vitals Value Taken Time  BP    Temp    Pulse    Resp    SpO2      Last Pain:  Vitals:   02/04/23 0810  TempSrc:   PainSc: 0-No pain         Complications:  Encounter Notable Events  Notable Event Outcome Phase Comment  Difficult to intubate - expected  Intraprocedure Filed from anesthesia note documentation.

## 2023-02-04 NOTE — H&P (Signed)
Electrophysiology Office Note:     Date:  02/04/2023    ID:  Jonathan Dyer, DOB [birth year], MRN [id number]   PCP:  Shade Flood, MD         [facility] Cardiologist:  Nona Dell, MD  [facility] Electrophysiologist:  Lanier Prude, MD    Referring MD: Shade Flood, MD    Chief Complaint: Watchman   History of Present Illness:     Jonathan Dyer is a 67 y.o. male who presents for a watchman consult at the request of Dr. Diona Browner. His medical history includes afib and bradycardia.    Today,   He hasn't had any problems with the flecainide. He will get a small flutter occasionally, but his heart rate/rhythm has been well controlled by flecainide for 13-14 years now.   He is trying to lose weight by bicycling.    He is not interested in additional medications, and is interested in the [place] procedure.    He sees his primary doctor every 6 months.   He denies any chest pain, shortness of breath, or peripheral edema. No lightheadedness, headaches, syncope, orthopnea, or PND.   Presents for [employer] implant today. Procedure reviewed.    Objective     Past Medical History:  Diagnosis Date   Allergy     Arthritis     Atrial fibrillation (HCC)      Diagnosed 09/2009, on flecainide   Back pain     Bilateral swelling of feet     Bradycardia      Event monitor 2010: HR down to 45, asymptomatic.   Cataract     Coronary atherosclerosis of native coronary artery      Nonobstructive and distal disease by cath 2005.    Essential hypertension     Hyperlipidemia     Joint pain     OSA (obstructive sleep apnea)     Osteoarthritis     Overweight     Prediabetes     Renal cyst     Ringing in ears     Sleep apnea     Vitamin D deficiency                 Past Surgical History:  Procedure Laterality Date   BIOPSY   01/04/2018    Procedure: BIOPSY;  Surgeon: West Bali, MD;  Location: [facility];  Service: Endoscopy;;  ascending  colon    CARDIAC CATHETERIZATION   2005   COLONOSCOPY   05/2006    SLF: 3 mm sigmoid: Tubular adenoma removed.   COLONOSCOPY N/A 05/06/2016    Dr. Darrick Penna: two sessile polyps in mid transverse colon and hepatic flexure (tubular adenomas). ext/int hemorrhoids. tortuous left colon.    COLONOSCOPY WITH PROPOFOL N/A 01/04/2018    Procedure: COLONOSCOPY WITH PROPOFOL;  Surgeon: West Bali, MD;  Location: [facility];  Service: Endoscopy;  Laterality: N/A;  2:30pm   KNEE ARTHROSCOPY       Left anterior cruciate ligament reconstruction       left shoulder surgery        dr . Molly Maduro collins    POLYPECTOMY   05/06/2016    Procedure: POLYPECTOMY;  Surgeon: West Bali, MD;  Location: [facility];  Service: Endoscopy;;  transverse colon polyp, hepatic flexure polyp,    Right shoulder surgery       ROBOT ASSISTED LAPAROSCOPIC NEPHRECTOMY Left 07/04/2018    Procedure: XI ROBOTIC ASSISTED LAPAROSCOPIC RENAL CYST DECORTICATION;  Surgeon: Malen Gauze, MD;  Location: [facility];  Service: Urology;  Laterality: Left;  3 HRSPROCEDURE: ROBOTIC RENAL CYST DECORTICATION   TONSILLECTOMY       TOTAL KNEE ARTHROPLASTY              Current Medications: Active Medications      Current Meds  Medication Sig   amLODipine (NORVASC) 10 MG tablet Take 1 tablet (10 mg total) by mouth daily.   apixaban (ELIQUIS) 5 MG TABS tablet Take 1 tablet (5 mg total) by mouth 2 (two) times daily.   chlorthalidone (HYGROTON) 25 MG tablet TAKE ONE-HALF TABLET BY MOUTH  DAILY   Coenzyme Q10 (COQ10) 100 MG CAPS Take 1 capsule by mouth daily.   flecainide (TAMBOCOR) 50 MG tablet Take 1 tablet (50 mg total) by mouth 2 (two) times daily.   furosemide (LASIX) 40 MG tablet Take 1 tablet (40 mg total) by mouth daily as needed for fluid.   losartan (COZAAR) 100 MG tablet Take 1 tablet (100 mg total) by mouth daily.   Multiple Vitamin (MULTIVITAMIN) tablet Take 1 tablet by mouth daily.   omeprazole (PRILOSEC) 20 MG capsule  Take 1 capsule (20 mg total) by mouth daily as needed (as needed for acid relux . indigestion).   potassium chloride (KLOR-CON) 10 MEQ tablet TAKE 1 TABLET BY MOUTH DAILY   rosuvastatin (CRESTOR) 5 MG tablet TAKE 1 TABLET BY MOUTH DAILY   tamsulosin (FLOMAX) 0.4 MG CAPS capsule Take 0.4 mg by mouth. Takes occasionally.   Vitamin D, Ergocalciferol, (DRISDOL) 1.25 MG (50000 UNIT) CAPS capsule Take 1 capsule (50,000 Units total) by mouth every 7 (seven) days.        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    Social Determinants of Health    Financial Resource Strain: Not on file  Food Insecurity: Not on file  Transportation Needs: Not on file  Physical Activity: Not on file  Stress: Not on file  Social Connections: Not on file      Family History: The patient's family history includes Arthritis in his sister; Cancer in his father; Diabetes in his mother and another family member; Heart disease in his father; Hyperlipidemia in his brother; Hypertension in his brother and father; Lung disease in his father; Obesity in his mother; Pulmonary embolism in his mother. There is no history of Colon cancer.   ROS:    Please see the history of present illness.      (+) Mild heart flutter   All other systems reviewed and are negative.   EKGs/Labs/Other Studies Reviewed:  The following studies were reviewed today:       EKG:   EKG is personally reviewed.  03/25/22: SR, PR interval 192, QRS duration 120     Recent Labs: 01/27/2022: ALT 28; BUN 15; Creatinine, Ser 1.23; Potassium 4.8; Sodium 140; TSH 1.890    Recent Lipid Panel Labs (Brief)          Component Value Date/Time    CHOL 215 (H) 01/27/2022 1020    TRIG 172 (H) 01/27/2022 1020    HDL 52 01/27/2022 1020    CHOLHDL 6 04/01/2021 0813    VLDL 24.4 04/01/2021 0813    LDLCALC 132 (H) 01/27/2022 1020    LDLCALC 131 (H) 10/03/2019 0926        Physical Exam:     VS:  BP 159/85   Pulse 73   Ht 6\' 3"  (1.905 m)   Wt 276 lb (125.2 kg)   SpO2 96%   BMI 34.50 kg/m         Wt Readings from Last 3 Encounters:  03/25/22 276 lb (125.2 kg)  03/23/22 274 lb 3.2 oz (124.4 kg)  03/03/22 271 lb (122.9 kg)      GEN: Well nourished, well developed in no acute distress HEENT: Normal NECK: No JVD; No carotid bruits LYMPHATICS: No lymphadenopathy CARDIAC: RRR, no murmurs, rubs, gallops RESPIRATORY:  Clear to auscultation without rales, wheezing or rhonchi  ABDOMEN: Soft, non-tender, non-distended MUSCULOSKELETAL:  No edema; No deformity  SKIN: Warm and dry NEUROLOGIC:  Alert and oriented x 3 PSYCHIATRIC:  Normal affect          Assessment ASSESSMENT:     1. Paroxysmal atrial fibrillation (HCC)   2. Pre-op evaluation   3. Primary hypertension     PLAN:     In order of problems listed above:   #Persistent AF Maintaining sinus rhythm on Flecainide. Currently not on a nodal blocker--risks were discussed in clinic today. Discussed ablation briefly today--he would like to continue on flecainide given his good symptom control for many years. I think this is very reasonable.   He would like to avoid exposure to Eliquis given  his high Dyer hobbies (cycling) and concern about fall/injury/bleed Dyer.    ---------------   I have seen Jonathan Dyer in the office today who is being considered for a Watchman left atrial appendage closure device. I believe they will benefit from this procedure given their history of atrial fibrillation, CHA2DS2-VASc score of 4 and unadjusted ischemic stroke rate of 4.2% per year. Unfortunately, the patient is not felt to be a long term anticoagulation candidate secondary to high Dyer hobbies and his elevated Dyer of severe bleeding. The patient's chart has been reviewed and I feel that they would be a candidate for short term oral anticoagulation after Watchman implant.    It is my belief that after undergoing a LAA closure procedure, Jonathan Dyer.    Procedural risks for the Watchman implant have been reviewed with the patient including a 0.5% Dyer of stroke, <1% Dyer of perforation and <1% Dyer of device embolization. Other risks include bleeding, vascular damage, tamponade, worsening renal function, and death. The patient understands these Dyer and wishes to proceed.       The published clinical data on the safety and effectiveness of WATCHMAN include but are not limited to the following: - Holmes DR, Everlene Farrier, Sick P et al. for  the PROTECT AF Investigators. Percutaneous closure of the left atrial appendage versus warfarin therapy for prevention of stroke in patients with atrial fibrillation: a randomised non-inferiority trial. Lancet 2009; 374: 534-42. Everlene Farrier, Doshi SK, Isa Rankin D et al. on behalf of the PROTECT AF Investigators. Percutaneous Left Atrial Appendage Closure for Stroke Prophylaxis in Patients With Atrial Fibrillation 2.3-Year Follow-up of the PROTECT AF (Watchman Left Atrial Appendage System for Embolic Protection in Patients With Atrial  Fibrillation) Trial. Circulation 2013; 127:720-729. - Alli O, Doshi S,  Kar S, Reddy VY, Sievert H et al. Quality of Life Assessment in the Randomized PROTECT AF (Percutaneous Closure of the Left Atrial Appendage Versus Warfarin Therapy for Prevention of Stroke in Patients With Atrial Fibrillation) Trial of Patients at Dyer for Stroke With Nonvalvular Atrial Fibrillation. J Am Coll Cardiol 2013; 61:1790-8. Aline August DR, Mia Creek, Price M, Whisenant B, Sievert H, Doshi S, Huber K, Reddy V. Prospective randomized evaluation of the Watchman left atrial appendage Device in patients with atrial fibrillation versus long-term warfarin therapy; the PREVAIL trial. Journal of the [employer] of Cardiology, Vol. 4, No. 1, 2014, 1-11. - Kar S, Doshi SK, Sadhu A, Horton R, Osorio J et al. Primary outcome evaluation of a next-generation left atrial appendage closure device: results from the PINNACLE FLX trial. Circulation 2021;143(18)1754-1762.      After today's visit with the patient which was dedicated solely for shared decision making visit regarding LAA closure device, the patient decided to proceed with the LAA appendage closure procedure scheduled to be done in the near future at [facility]. Prior to the procedure, I would like to obtain a gated CT scan of the chest with contrast timed for PV/LA visualization.        HAS-BLED score 2 Hypertension Yes  Abnormal renal and liver function (Dialysis, transplant, Cr >2.26 mg/dL /Cirrhosis or Bilirubin >2x Normal or AST/ALT/AP >3x Normal) No  Stroke No  Bleeding No  Labile INR (Unstable/high INR) No  Elderly (>65) Yes  Drugs or alcohol (>= 8 drinks/week, anti-plt or NSAID) No    CHA2DS2-VASc Score = 4  The patient's score is based upon: CHF History: 0 HTN History: 1 Diabetes History: 1 Stroke History: 0 Vascular Disease History: 1 Age Score: 1 Gender Score: 0    Presents for Watchman implant today. Procedure reviewed.      Signed, Rossie Muskrat. Lalla Brothers, MD, [facility], [facility] 02/04/2023 Electrophysiology [facility]

## 2023-02-04 NOTE — Discharge Summary (Signed)
[facility]    [name],  MRN: [id number], DOB/AGE: [birth year] 69 y.o.  Admit date: 02/04/2023 Discharge date: 02/04/2023  Primary Care Physician: Billie Lade, MD  Primary Cardiologist: Nona Dell, MD  Electrophysiologist: Lanier Prude, MD  Primary Discharge Diagnosis:  Paroxysmal Atrial Fibrillation Poor candidacy for long term anticoagulation due to  active lifestyle with high risk activities.   Procedures This Admission:  Transeptal Puncture Intra-procedural TEE which showed no LAA thrombus Left atrial appendage occlusive device placement on 02/04/23 by Dr. Lalla Brothers.   This study demonstrated:  CONCLUSIONS:  1.Successful implantation of a WATCHMAN left atrial appendage occlusive device    2. TEE demonstrating no LAA thrombus 3. No early apparent complications.    Post Implant Anticoagulation Strategy: Continue Eliquis for 45 days after implant. After 45 days, stop Eliquis and start Plavix 75mg  PO daily to complete 6 months of post implant therapy. Plan for CT scan after 60 days to assess Watchman position and left atrial appendage seal.   Brief HPI: Jonathan Dyer is a 67 y.o. male with a history of paroxsymal atrial fibrillation, HLD with hx of statin intolerance, HTN, and dilated aortic aneurysm who was referred to Dr. Lalla Brothers for the evaluation of LAAO closure and presented to [facility] today for Watchman implant.    Mr. Favre is followed by Dr. Diona Browner for his cardiology care. He is very active at baseline and spoke to Dr. Diona Browner about coming off his anticoagulation. At referral he was felt to be a good candidate based on cardiac CT which showed anatomy suitable to proceed however also an elevated calcium score at 5250. He underwent stress testing 08/03/2022 that showed no evidence of ischemia and felt low risk.    Hospital Course:  The patient was admitted and underwent left atrial appendage occlusive device  placement with 27mm Watchman FLX Pro device. He was monitored in the post procedure setting and has done very well with no concerns. Given this, he is being considered for same day discharge later today. Groin site has been stable without evidence of hematoma or bleeding. Wound care and restrictions were reviewed with the patient. The patient has been scheduled for post procedure follow up with [name], NP in approximately 1 month. He will restart Eliquis later this evening and continue for 45 days (11/10) at which time he will stop and transition to PLavix 75mg  daily through 6 months of therapy (08/04/23). He will require 6 months of dental SBE. This will be discussed and RXd at follow up with myself. A repeat CT will be performed in approximately 60 days to ensure proper seal of the device.   Physical Exam: Vitals:   02/04/23 1325 02/04/23 1330 02/04/23 1400 02/04/23 1441  BP:  136/78 (!) 151/80 (!) 146/80  Pulse: (!) 56 60 71 71  Resp: 18 16 (!) 25 16  Temp:    98.4 F (36.9 C)  TempSrc:    Oral  SpO2: 93% 93% 92% 96%  Weight:      Height:       Labs:   Lab Results  Component Value Date   WBC 5.9 01/18/2023   HGB 15.6 01/18/2023   HCT 47.4 01/18/2023   MCV 90 01/18/2023   PLT 285 01/18/2023   No results for input(s): "NA", "K", "CL", "CO2", "BUN", "CREATININE", "CALCIUM", "PROT", "BILITOT", "ALKPHOS", "ALT", "AST", "GLUCOSE" in the last 168 hours.  Invalid input(s): "LABALBU"   Discharge Medications:  Allergies  as of 02/04/2023       Reactions   Clindamycin/lincomycin Swelling, Other (See Comments)   Mycin drug family    Penicillin G Other (See Comments)   Penicillins Other (See Comments)   Has patient had a PCN reaction causing immediate rash, facial/tongue/throat swelling, SOB or lightheadedness with hypotension: unknown Has patient had a PCN reaction causing severe rash involving mucus membranes or skin necrosis: unknown Has patient had a PCN reaction that required  hospitalization unknown Has patient had a PCN reaction occurring within the last 10 years: unknown If all of the above answers are "NO", then may proceed    Pneumococcal 13-val Conj Vacc Swelling   Patient stated that he has severe swelling in his arm from vaccine and swollen up to his collar bone. Lasted about 2 days and did not feel well.         Medication List     STOP taking these medications    aspirin EC 325 MG tablet       TAKE these medications    amLODipine 10 MG tablet Commonly known as: NORVASC TAKE 1 TABLET BY MOUTH DAILY   apixaban 5 MG Tabs tablet Commonly known as: ELIQUIS Take 1 tablet (5 mg total) by mouth 2 (two) times daily.   chlorthalidone 25 MG tablet Commonly known as: HYGROTON Take 0.5 tablets (12.5 mg total) by mouth daily as needed.   flecainide 50 MG tablet Commonly known as: TAMBOCOR TAKE 1 TABLET BY MOUTH TWICE  DAILY   furosemide 40 MG tablet Commonly known as: LASIX TAKE 1 TABLET BY MOUTH DAILY AS  NEEDED FOR FLUID   losartan 100 MG tablet Commonly known as: COZAAR TAKE 1 TABLET BY MOUTH DAILY   multivitamin tablet Take 1 tablet by mouth daily.   omeprazole 20 MG capsule Commonly known as: PRILOSEC TAKE 1 CAPSULE BY MOUTH DAILY AS NEEDED FOR ACID REFLUX,  INDIGESTION   potassium chloride 10 MEQ tablet Commonly known as: KLOR-CON Take 1 tablet (10 mEq total) by mouth daily as needed.   Repatha SureClick 140 MG/ML Soaj Generic drug: Evolocumab Inject 140 mg into the skin every 14 (fourteen) days.   tamsulosin 0.4 MG Caps capsule Commonly known as: FLOMAX Take 0.4 mg by mouth. Takes occasionally.   Vitamin D (Ergocalciferol) 1.25 MG (50000 UNIT) Caps capsule Commonly known as: DRISDOL Take 1 capsule (50,000 Units total) by mouth every 7 (seven) days.        Disposition:  Home Discharge Instructions     Diet - low sodium heart healthy   Complete by: As directed    Discharge instructions   Complete by: As  directed    [facility] Procedure, Care After  Procedure MD: Dr. Isidoro Donning Clinical Coordinator: Karsten Fells, RN  This sheet gives you information about how to care for yourself after your procedure. Your health care provider may also give you more specific instructions. If you have problems or questions, contact your health care provider.  What can I expect after the procedure? After the procedure, it is common to have: Bruising around your puncture site. Tenderness around your puncture site. Tiredness (fatigue).  Medication instructions It is very important to continue to take your blood thinner as directed by your doctor after the Watchman procedure. Call your procedure doctor's office with question or concerns. If you are on Coumadin (warfarin), you will have your INR checked the week after your procedure, with a goal INR of 2.0 - 3.0. Please follow your medication instructions  on your discharge summary. Only take the medications listed on your discharge paperwork.  Follow up You will be seen in 1 month after your procedure You will have a repeat CT scan approximately 8 weeks after your procedure mark to check your device You will follow up the MD/APP who performed your procedure 6 months after your procedure The Watchman Clinical Coordinator will check in with you from time to time, including 1 and 2 years after your procedure.    Follow these instructions at home: Puncture site care  Follow instructions from your health care provider about how to take care of your puncture site. Make sure you: If present, leave stitches (sutures), skin glue, or adhesive strips in place.  If a large square bandage is present, this may be removed 24 hours after surgery.  Check your puncture site every day for signs of infection. Check for: Redness, swelling, or pain. Fluid or blood. If your puncture site starts to bleed, lie down on your back, apply firm pressure to the area, and contact your  health care provider. Warmth. Pus or a bad smell. Driving Do not drive yourself home if you received sedation Do not drive for at least 4 days after your procedure or however long your health care provider recommends. (Do not resume driving if you have previously been instructed not to drive for other health reasons.) Do not spend greater than 1 hour at a time in a car for the first 3 days. Stop and take a break with a 5 minute walk at least every hour.  Do not drive or use heavy machinery while taking prescription pain medicine.  Activity Avoid activities that take a lot of effort, including exercise, for at least 7 days after your procedure. For the first 3 days, avoid sitting for longer than one hour at a time.  Avoid alcoholic beverages, signing paperwork, or participating in legal proceedings for 24 hours after receiving sedation Do not lift anything that is heavier than 10 lb (4.5 kg) for one week.  No sexual activity for 1 week.  Return to your normal activities as told by your health care provider. Ask your health care provider what activities are safe for you. General instructions Take over-the-counter and prescription medicines only as told by your health care provider. Do not use any products that contain nicotine or tobacco, such as cigarettes and e-cigarettes. If you need help quitting, ask your health care provider. You may shower after 24 hours, but Do not take baths, swim, or use a hot tub for 1 week.  Do not drink alcohol for 24 hours after your procedure. Keep all follow-up visits as told by your health care provider. This is important. Dental Work: You will require antibiotics prior to any dental work, including cleanings, for 6 months after your Watchman implantation to help protect you from infection. After 6 months, antibiotics are no longer required. Contact a health care provider if: You have redness, mild swelling, or pain around your puncture site. You have soreness  in your throat or at your puncture site that does not improve after several days You have fluid or blood coming from your puncture site that stops after applying firm pressure to the area. Your puncture site feels warm to the touch. You have pus or a bad smell coming from your puncture site. You have a fever. You have chest pain or discomfort that spreads to your neck, jaw, or arm. You are sweating a lot. You feel nauseous. You  have a fast or irregular heartbeat. You have shortness of breath. You are dizzy or light-headed and feel the need to lie down. You have pain or numbness in the arm or leg closest to your puncture site. Get help right away if: Your puncture site suddenly swells. Your puncture site is bleeding and the bleeding does not stop after applying firm pressure to the area. These symptoms may represent a serious problem that is an emergency. Do not wait to see if the symptoms will go away. Get medical help right away. Call your local emergency services (911 in the [place] Do not drive yourself to the hospital. Summary After the procedure, it is normal to have bruising and tenderness at the puncture site in your groin, neck, or forearm. Check your puncture site every day for signs of infection. Get help right away if your puncture site is bleeding and the bleeding does not stop after applying firm pressure to the area. This is a medical emergency.  This information is not intended to replace advice given to you by your health care provider. Make sure you discuss any questions you have with your health care provider.   Increase activity slowly   Complete by: As directed        Follow-up Information     Filbert Schilder, NP Follow up on 03/17/2023.   Specialty: Cardiology Why: @ 9:55am. Please arrive at 9:40am Contact information: [address] [place] [address] [phone]                 Duration of Discharge Encounter: Greater than 30 minutes  including physician time.  Signed, [name], NP  02/04/2023 3:44 PM

## 2023-02-04 NOTE — Anesthesia Postprocedure Evaluation (Signed)
Anesthesia Post Note  Patient: Jonathan Dyer  Procedure(s) Performed: LEFT ATRIAL APPENDAGE OCCLUSION TRANSESOPHAGEAL ECHOCARDIOGRAM     Patient location during evaluation: Cath Lab Anesthesia Type: General Level of consciousness: awake and alert, oriented and patient cooperative Pain management: pain level controlled Vital Signs Assessment: post-procedure vital signs reviewed and stable Respiratory status: spontaneous breathing, nonlabored ventilation, respiratory function stable and patient connected to nasal cannula oxygen Cardiovascular status: blood pressure returned to baseline and stable Postop Assessment: no apparent nausea or vomiting Anesthetic complications: yes   Encounter Notable Events  Notable Event Outcome Phase Comment  Difficult to intubate - expected  Intraprocedure Filed from anesthesia note documentation.    Last Vitals:  Vitals:   02/04/23 1330 02/04/23 1400  BP: 136/78 (!) 151/80  Pulse: 60 71  Resp: 16 (!) 25  Temp:    SpO2: 93% 92%    Last Pain:  Vitals:   02/04/23 1112  TempSrc: Temporal  PainSc: 4                  Ramyah Pankowski,E. Icelyn Navarrete

## 2023-02-04 NOTE — Anesthesia Preprocedure Evaluation (Addendum)
Anesthesia Evaluation  Patient identified by MRN, date of birth, ID band Patient awake    Reviewed: Allergy & Precautions, NPO status , Patient's Chart, lab work & pertinent test results  History of Anesthesia Complications Negative for: history of anesthetic complications  Airway Mallampati: II  TM Distance: <3 FB Neck ROM: Full    Dental  (+) Missing, Dental Advisory Given   Pulmonary sleep apnea and Continuous Positive Airway Pressure Ventilation    breath sounds clear to auscultation       Cardiovascular hypertension, Pt. on medications + CAD (non-obstructive)  + dysrhythmias Atrial Fibrillation  Rhythm:Irregular Rate:Normal  07/2022 stress: Low risk study with Duke treadmill score of 9, no exercise-induced arrhythmias, and perfusion imaging most consistent with variable soft tissue attenuation.  No large ischemic territories are noted.  LVEF 53%.  04/2022 ECHO: EF 55 to 60%.  1. The LV has normal function, no regional wall motion abnormalities. The global longitudinal strain is normal.   2. RVF not well visualized but appears to be mildly reduced. The right ventricular size is mildly enlarged.   3. The mitral valve is grossly normal. Mild MR. No evidence of mitral stenosis.   4. The aortic valve is tricuspid. There is mild calcification of the aortic valve. Aortic valve regurgitation is not visualized. No aortic stenosis is present.     Neuro/Psych negative neurological ROS     GI/Hepatic Neg liver ROS,GERD  Controlled,,  Endo/Other    Morbid obesityBMI 33  Renal/GU Renal InsufficiencyRenal disease     Musculoskeletal  (+) Arthritis ,    Abdominal   Peds  Hematology eliquis   Anesthesia Other Findings   Reproductive/Obstetrics                              Anesthesia Physical Anesthesia Plan  ASA: 3  Anesthesia Plan: General   Post-op Pain Management: Tylenol PO (pre-op)*    Induction: Intravenous  PONV Risk Score and Plan: 2 and Ondansetron and Dexamethasone  Airway Management Planned: Oral ETT and Video Laryngoscope Planned  Additional Equipment: ClearSight  Intra-op Plan:   Post-operative Plan: Extubation in OR  Informed Consent: I have reviewed the patients History and Physical, chart, labs and discussed the procedure including the risks, benefits and alternatives for the proposed anesthesia with the patient or authorized representative who has indicated his/her understanding and acceptance.     Dental advisory given  Plan Discussed with: CRNA and Surgeon  Anesthesia Plan Comments:          Anesthesia Quick Evaluation

## 2023-02-05 ENCOUNTER — Encounter (HOSPITAL_COMMUNITY): Payer: Self-pay | Admitting: Cardiology

## 2023-02-05 ENCOUNTER — Telehealth: Payer: Self-pay | Admitting: Cardiology

## 2023-02-05 MED FILL — Fentanyl Citrate Preservative Free (PF) Inj 100 MCG/2ML: INTRAMUSCULAR | Qty: 2 | Status: AC

## 2023-02-05 NOTE — Telephone Encounter (Signed)
  [facility]   MULTIDISCIPLINARY HEART TEAM   Patient contacted regarding discharge from [facility] on 02/04/23 s/p LAAO with no complaints today. Groin site reported as stable. Patient resumed Eliquis yesterday evening.    Patient understands to follow up with provider [name] on 03/17/23.  Patient understands discharge instructions? Yes  Patient understands medications and regimen? Yes  Patient understands to bring all medications to this visit? Yes   [name] NP-C Structural Heart Team  Pager: [phone]

## 2023-02-12 ENCOUNTER — Other Ambulatory Visit: Payer: Self-pay | Admitting: Cardiology

## 2023-02-16 ENCOUNTER — Ambulatory Visit (INDEPENDENT_AMBULATORY_CARE_PROVIDER_SITE_OTHER): Payer: Medicare Other

## 2023-02-16 VITALS — Ht 75.0 in | Wt 260.0 lb

## 2023-02-16 DIAGNOSIS — Z0000 Encounter for general adult medical examination without abnormal findings: Secondary | ICD-10-CM

## 2023-02-16 NOTE — Patient Instructions (Signed)
Mr. Bohnenkamp , Thank you for taking time to come for your Medicare Wellness Visit. I appreciate your ongoing commitment to your health goals. Please review the following plan we discussed and let me know if I can assist you in the future.   Referrals/Orders/Follow-Ups/Clinician Recommendations:  Next Medicare Annual Wellness Visit: February 21, 2024 at 3:50pm   You are due for the vaccines checked below. You may have these done at your preferred pharmacy. Please have them fax the office proof of the vaccines so that we can update your chart.   [x]  Flu (due annually)  Recommended this fall either at PCP office or through your local pharmacy. The flu season starts August 1 of each year.   [x]  Shingrix (Shingles vaccine): CDC recommends 2 doses of Shingrix separated by 2-6 months for aged 72 years and older:  []  Pneumonia Vaccines: Recommended for adults 65 years or older  []  TDAP (Tetanus) Vaccine every 10 years:Recommended every 10 years; Please call your insurance company to determine your out of pocket expense. You also receive this vaccine at your local pharmacy or Health Dept.  [x]  Covid-19: Available now at any [facility] pharmacy (see info below)  You may also get your vaccines at any [facility] (locations listed below.) Vaccine hours are Monday - Friday 9:00 - 4:00. No appointments are required. Most insurances are accepted including Medicaid. Anyone can use the community pharmacies, and people are not required to have a [facility] provider.  Community Pharmacy Locations offering vaccines:   Sport and exercise psychologist   [facility] [place]  10 vaccines are offered at the [employer]: Covid, flu, Tdap, shingles, RSV, pneumonia, meningococcal, hepatitis A, hepatitis B, and HPV.    This is a list of the screening recommended for you and due dates:  Health Maintenance  Topic  Date Due   Zoster (Shingles) Vaccine (1 of 2) Never done   COVID-19 Vaccine (3 - Moderna risk series) 09/20/2019   Flu Shot  12/10/2022   Medicare Annual Wellness Visit  02/16/2024   DTaP/Tdap/Td vaccine (2 - Td or Tdap) 01/08/2025   Pneumonia Vaccine (3 of 3 - PPSV23 or PCV20) 08/07/2025   Colon Cancer Screening  01/05/2028   Hepatitis C Screening  Completed   HPV Vaccine  Aged Out    Advanced directives: (Declined) Advance directive discussed with you today. Even though you declined this today, please call our office should you change your mind, and we can give you the proper paperwork for you to fill out.  Next Medicare Annual Wellness Visit scheduled for next year: Yes  Preventive Care 51 Years and Older, Male Preventive care refers to lifestyle choices and visits with your health care provider that can promote health and wellness. Preventive care visits are also called wellness exams. What can I expect for my preventive care visit? Counseling During your preventive care visit, your health care provider may ask about your: Medical history, including: Past medical problems. Family medical history. History of falls. Current health, including: Emotional well-being. Home life and relationship well-being. Sexual activity. Memory and ability to understand (cognition). Lifestyle, including: Alcohol, nicotine or tobacco, and drug use. Access to firearms. Diet, exercise, and sleep habits. Work and work Astronomer. Sunscreen use. Safety issues such as seatbelt and bike helmet use. Physical exam Your health care provider will check your: Height and weight. These may be used to calculate your BMI (  body mass index). BMI is a measurement that tells if you are at a healthy weight. Waist circumference. This measures the distance around your waistline. This measurement also tells if you are at a healthy weight and may help predict your risk of certain diseases, such as type 2 diabetes and  high blood pressure. Heart rate and blood pressure. Body temperature. Skin for abnormal spots. What immunizations do I need?  Vaccines are usually given at various ages, according to a schedule. Your health care provider will recommend vaccines for you based on your age, medical history, and lifestyle or other factors, such as travel or where you work. What tests do I need? Screening Your health care provider may recommend screening tests for certain conditions. This may include: Lipid and cholesterol levels. Diabetes screening. This is done by checking your blood sugar (glucose) after you have not eaten for a while (fasting). Hepatitis C test. Hepatitis B test. HIV (human immunodeficiency virus) test. STI (sexually transmitted infection) testing, if you are at risk. Lung cancer screening. Colorectal cancer screening. Prostate cancer screening. Abdominal aortic aneurysm (AAA) screening. You may need this if you are a current or former smoker. Talk with your health care provider about your test results, treatment options, and if necessary, the need for more tests. Follow these instructions at home: Eating and drinking  Eat a diet that includes fresh fruits and vegetables, whole grains, lean protein, and low-fat dairy products. Limit your intake of foods with high amounts of sugar, saturated fats, and salt. Take vitamin and mineral supplements as recommended by your health care provider. Do not drink alcohol if your health care provider tells you not to drink. If you drink alcohol: Limit how much you have to 0-2 drinks a day. Know how much alcohol is in your drink. In the [place] one drink equals one 12 oz bottle of beer (355 mL), one 5 oz glass of wine (148 mL), or one 1 oz glass of hard liquor (44 mL). Lifestyle Brush your teeth every morning and night with fluoride toothpaste. Floss one time each day. Exercise for at least 30 minutes 5 or more days each week. Do not use any products  that contain nicotine or tobacco. These products include cigarettes, chewing tobacco, and vaping devices, such as e-cigarettes. If you need help quitting, ask your health care provider. Do not use drugs. If you are sexually active, practice safe sex. Use a condom or other form of protection to prevent STIs. Take aspirin only as told by your health care provider. Make sure that you understand how much to take and what form to take. Work with your health care provider to find out whether it is safe and beneficial for you to take aspirin daily. Ask your health care provider if you need to take a cholesterol-lowering medicine (statin). Find healthy ways to manage stress, such as: Meditation, yoga, or listening to music. Journaling. Talking to a trusted person. Spending time with friends and family. Safety Always wear your seat belt while driving or riding in a vehicle. Do not drive: If you have been drinking alcohol. Do not ride with someone who has been drinking. When you are tired or distracted. While texting. If you have been using any mind-altering substances or drugs. Wear a helmet and other protective equipment during sports activities. If you have firearms in your house, make sure you follow all gun safety procedures. Minimize exposure to UV radiation to reduce your risk of skin cancer. What's next? Visit your  health care provider once a year for an annual wellness visit. Ask your health care provider how often you should have your eyes and teeth checked. Stay up to date on all vaccines. This information is not intended to replace advice given to you by your health care provider. Make sure you discuss any questions you have with your health care provider. Document Revised: 10/23/2020 Document Reviewed: 10/23/2020 Elsevier Patient Education  2024 [employer].

## 2023-02-16 NOTE — Progress Notes (Signed)
Because this visit was a virtual/telehealth visit,  certain criteria was not obtained, such a blood pressure, CBG if applicable, and timed get up and go. Any medications not marked as "taking" were not mentioned during the medication reconciliation part of the visit. Any vitals not documented were not able to be obtained due to this being a telehealth visit or patient was unable to self-report a recent blood pressure reading due to a lack of equipment at home via telehealth. Vitals that have been documented are verbally provided by the patient.   Subjective:   Jonathan Dyer is a 67 y.o. male who presents for Medicare Annual/Subsequent preventive examination.  Visit Complete: Virtual I connected with  Jonathan Dyer on 02/16/23 by a audio enabled telemedicine application and verified that I am speaking with the correct person using two identifiers.  Patient Location: Home  Provider Location: Home Office  I discussed the limitations of evaluation and management by telemedicine. The patient expressed understanding and agreed to proceed.  Vital Signs: Because this visit was a virtual/telehealth visit, some criteria may be missing or patient reported. Any vitals not documented were not able to be obtained and vitals that have been documented are patient reported.  Patient Medicare AWV questionnaire was completed by the patient on na; I have confirmed that all information answered by patient is correct and no changes since this date.  Cardiac Risk Factors include: advanced age (>54men, >54 women);dyslipidemia;hypertension;obesity (BMI >30kg/m2);male gender;Other (see comment), Risk factor comments: OSA, CAD     Objective:    Today's Vitals   02/16/23 1526  Weight: 260 lb (117.9 kg)  Height: 6\' 3"  (1.905 m)  PainSc: 1    Body mass index is 32.5 kg/m.     02/16/2023    3:29 PM 02/04/2023    8:03 AM 07/04/2018    6:14 AM 06/28/2018    8:51 AM 05/06/2016    8:31 AM 08/01/2014     1:18 PM 10/07/2012    6:12 PM  Advanced Directives  Does Patient Have a Medical Advance Directive? No No No No No No Patient does not have advance directive  Would patient like information on creating a medical advance directive? No - Patient declined No - Patient declined No - Patient declined No - Patient declined  No - patient declined information   Pre-existing out of facility DNR order (yellow form or pink MOST form)       No    Current Medications (verified) Outpatient Encounter Medications as of 02/16/2023  Medication Sig   amLODipine (NORVASC) 10 MG tablet TAKE 1 TABLET BY MOUTH DAILY   apixaban (ELIQUIS) 5 MG TABS tablet Take 1 tablet (5 mg total) by mouth 2 (two) times daily.   chlorthalidone (HYGROTON) 25 MG tablet Take 0.5 tablets (12.5 mg total) by mouth daily as needed.   Evolocumab (REPATHA SURECLICK) 140 MG/ML SOAJ Inject 140 mg into the skin every 14 (fourteen) days.   flecainide (TAMBOCOR) 50 MG tablet TAKE 1 TABLET BY MOUTH TWICE  DAILY   furosemide (LASIX) 40 MG tablet TAKE 1 TABLET BY MOUTH DAILY AS  NEEDED FOR FLUID   losartan (COZAAR) 100 MG tablet TAKE 1 TABLET BY MOUTH DAILY   Multiple Vitamin (MULTIVITAMIN) tablet Take 1 tablet by mouth daily.   omeprazole (PRILOSEC) 20 MG capsule TAKE 1 CAPSULE BY MOUTH DAILY AS NEEDED FOR ACID REFLUX,  INDIGESTION   potassium chloride (KLOR-CON) 10 MEQ tablet Take 1 tablet (10 mEq total) by mouth daily as  needed.   tamsulosin (FLOMAX) 0.4 MG CAPS capsule Take 0.4 mg by mouth. Takes occasionally.   Vitamin D, Ergocalciferol, (DRISDOL) 1.25 MG (50000 UNIT) CAPS capsule Take 1 capsule (50,000 Units total) by mouth every 7 (seven) days.   No facility-administered encounter medications on file as of 02/16/2023.    Allergies (verified) Clindamycin/lincomycin, Penicillin g, Penicillins, and Pneumococcal 13-val conj vacc   History: Past Medical History:  Diagnosis Date   Allergy    Arthritis    Atrial fibrillation (HCC)     Diagnosed 09/2009, on flecainide   Back pain    Bilateral swelling of feet    Bradycardia    Event monitor 2010: HR down to 45, asymptomatic.   Cataract    Coronary atherosclerosis of native coronary artery    Nonobstructive and distal disease by cath 2005.    Essential hypertension    Hyperlipidemia    Joint pain    OSA (obstructive sleep apnea)    Osteoarthritis    Overweight    Prediabetes    Presence of Watchman left atrial appendage closure device 02/04/2023   Watchman 27mm FLX Pro placed by Dr. Lalla Brothers   Renal cyst    Ringing in ears    Sleep apnea    Vitamin D deficiency    Past Surgical History:  Procedure Laterality Date   BIOPSY  01/04/2018   Procedure: BIOPSY;  Surgeon: Jonathan Bali, MD;  Location: [facility];  Service: Endoscopy;;  ascending colon    CARDIAC CATHETERIZATION  2005   COLONOSCOPY  05/2006   SLF: 3 mm sigmoid: Tubular adenoma removed.   COLONOSCOPY N/A 05/06/2016   Dr. Darrick Penna: two sessile polyps in mid transverse colon and hepatic flexure (tubular adenomas). ext/int hemorrhoids. tortuous left colon.    COLONOSCOPY WITH PROPOFOL N/A 01/04/2018   Procedure: COLONOSCOPY WITH PROPOFOL;  Surgeon: Jonathan Bali, MD;  Location: [facility];  Service: Endoscopy;  Laterality: N/A;  2:30pm   KNEE ARTHROSCOPY     Left anterior cruciate ligament reconstruction     LEFT ATRIAL APPENDAGE OCCLUSION N/A 02/04/2023   Procedure: LEFT ATRIAL APPENDAGE OCCLUSION;  Surgeon: Lanier Prude, MD;  Location: [facility];  Service: Cardiovascular;  Laterality: N/A;   left shoulder surgery     dr . Molly Maduro collins    POLYPECTOMY  05/06/2016   Procedure: POLYPECTOMY;  Surgeon: Jonathan Bali, MD;  Location: [facility];  Service: Endoscopy;;  transverse colon polyp, hepatic flexure polyp,    Right shoulder surgery     ROBOT ASSISTED LAPAROSCOPIC NEPHRECTOMY Left 07/04/2018   Procedure: XI ROBOTIC ASSISTED LAPAROSCOPIC RENAL CYST DECORTICATION;  Surgeon:  Malen Gauze, MD;  Location: [facility];  Service: Urology;  Laterality: Left;  3 HRSPROCEDURE: ROBOTIC RENAL CYST DECORTICATION   TEE WITHOUT CARDIOVERSION N/A 02/04/2023   Procedure: TRANSESOPHAGEAL ECHOCARDIOGRAM;  Surgeon: Lanier Prude, MD;  Location[facility] INVAS[facility] CV LAB;  Service: Cardiovascular;  Laterality: N/A;   TONSILLECTOMY     TOTAL KNEE ARTHROPLASTY     Family History  Problem Relation Age of Onset   Diabetes Mother    Pulmonary embolism Mother    Obesity Mother    Cancer Father    Hypertension Father    Lung disease Father    Heart disease Father    Arthritis Sister    Hyperlipidemia Brother    Hypertension Brother    Diabetes Other        Significant family h/o DM   Colon  cancer Neg Hx    Social History   Socioeconomic History   Marital status: Divorced    Spouse name: Not on file   Number of children: 3   Years of education: Not on file   Highest education level: Not on file  Occupational History   Occupation: PT-custodial work   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   Social Determinants of Health   Financial Resource Strain: Low Risk  (02/16/2023)   Overall Financial Resource Strain (CARDIA)    Difficulty of Paying Living Expenses: Not hard at all  Food Insecurity: No Food Insecurity (02/16/2023)   Hunger Vital Sign    Worried About Running Out of Food in the Last Year: Never true    Ran Out of Food in the Last Year: Never true  Transportation Needs: No Transportation Needs (02/16/2023)   PRAPARE - Therapist, art (Medical): No    Lack of Transportation (Non-Medical): No  Physical Activity: Sufficiently Active (02/16/2023)   Exercise Vital Sign    Days of Exercise per Week: 4 days    Minutes of Exercise per Session: 60 min  Stress: No Stress Concern Present (02/16/2023)   [employer] of Occupational Health - Occupational Stress Questionnaire    Feeling of Stress : Not at all  Social Connections: Moderately Integrated (02/16/2023)   Social Connection and Isolation Panel [NHANES]    Frequency of Communication with Friends and Family: More than three times a week    Frequency of Social Gatherings with Friends and Family: More than three times a week    Attends Religious Services: More than 4 times per year    Active Member of [employer] or Organizations: Yes    Attends Engineer, structural: More than 4 times per year    Marital Status: Divorced    Tobacco Counseling Counseling given: No   Clinical Intake:  Pre-visit preparation completed: Yes  Pain : 0-10 Pain Score: 1  Pain Type: Chronic pain Pain Location: Knee Pain Orientation: Right Pain Descriptors / Indicators: Aching Pain Onset: More than a month ago Pain Frequency: Intermittent     BMI - recorded: 32.5 Nutritional Status: BMI > 30  Obese Nutritional Risks: None Diabetes: No  How often do you need to have someone help you when you read instructions, pamphlets, or other written materials from your doctor or pharmacy?: 1 - Never  Interpreter Needed?: No  Information entered by :: Abby Antiono Ettinger, CMA   Activities of Daily Living    02/16/2023    3:28 PM 02/04/2023    8:12 AM  In your present state of health, do you have any difficulty performing the following activities:  Hearing? 0   Vision? 0   Difficulty concentrating or making decisions? 0   Walking or climbing stairs? 0   Dressing or bathing? 0   Doing errands, shopping? 0 0  Preparing Food and eating ? N   Using the Toilet? N    In the past six months, have you accidently  leaked urine? N   Do you have problems with loss of bowel control? N   Managing your Medications? N   Managing your Finances? N   Housekeeping or managing your Housekeeping? N     Patient Care Team: Billie Lade, MD as PCP - General (Internal Medicine) Jonelle Sidle, MD as PCP - Cardiology (Cardiology) Lanier Prude, MD as PCP - Electrophysiology (Cardiology) Jonathan Bali, MD (Inactive) as Consulting Physician (Gastroenterology) Lanelle Bal, DO as Consulting Physician (Internal Medicine)  Indicate any recent Medical Services you may have received from other than [facility] providers in the past year (date may be approximate).     Assessment:   This is a routine wellness examination for Delmar.  Hearing/Vision screen Hearing Screening - Comments:: Patient denies any hearing difficulties.   Vision Screening - Comments:: Wears rx glasses - up to date with routine eye exams. Sees [facility]    Goals Addressed             This Visit's Progress    Patient Stated       Cut an hour off off of my time during the 50 mile bike race in [place] this year       Depression Screen    02/16/2023    3:31 PM 12/29/2022    8:25 AM 10/19/2022    8:33 AM 07/03/2022    8:10 AM 04/06/2022   11:03 AM 01/27/2022    9:16 AM 07/21/2021   12:54 PM  PHQ 2/9 Scores  PHQ - 2 Score 0 0 0 0 0 0 0  PHQ- 9 Score 0 0 0  0 3 0    Fall Risk    02/16/2023    3:30 PM 12/29/2022    8:25 AM 10/19/2022    8:32 AM 07/03/2022    8:10 AM 04/06/2022   11:03 AM  Fall Risk   Falls in the past year? 0 0 0 0 0  Number falls in past yr: 0 0 0 0 0  Injury with Fall? 0 0 0 0 0  Risk for fall due to : No Fall Risks No Fall Risks No Fall Risks No Fall Risks No Fall Risks  Follow up Falls prevention discussed Falls evaluation completed Falls evaluation completed Falls evaluation completed Falls evaluation completed    MEDICARE RISK AT  HOME: Medicare Risk at Home Any stairs in or around the home?: No If so, are there any without handrails?: No Home free of loose throw rugs in walkways, pet beds, electrical cords, etc?: Yes Adequate lighting in your home to reduce risk of falls?: Yes Life alert?: No Use of a cane, walker or w/c?: No Grab bars in the bathroom?: No Shower chair or bench in shower?: No Elevated toilet seat or a handicapped toilet?: No  TIMED UP AND GO:  Was the test performed?  No    Cognitive Function:        02/16/2023    3:30 PM  6CIT Screen  What Year? 0 points  What month? 0 points  What time? 0 points  Count back from 20 0 points  Months in reverse 0 points  Repeat phrase 0 points  Total Score 0 points    Immunizations Immunization History  Administered Date(s) Administered   Fluad Quad(high Dose 65+) 03/27/2020, 03/31/2021   Influenza Inj Mdck Quad With Preservative 02/25/2016   Influenza,inj,Quad PF,6+ Mos 02/14/2015   Influenza-Unspecified 05/09/2012, 01/24/2013, 02/25/2017, 03/02/2017, 05/24/2018, 03/03/2019   Moderna Sars-Covid-2 Vaccination  07/20/2019, 08/23/2019   PPD Test 02/05/2014   Pneumococcal Conjugate-13 08/07/2020   Pneumococcal Polysaccharide-23 02/25/2016   Tdap 01/09/2015    TDAP status: Up to date  Flu Vaccine status: Due, Education has been provided regarding the importance of this vaccine. Advised may receive this vaccine at local pharmacy or Health Dept. Aware to provide a copy of the vaccination record if obtained from local pharmacy or Health Dept. Verbalized acceptance and understanding.  Pneumococcal vaccine status: Up to date  Covid-19 vaccine status: Information provided on how to obtain vaccines.   Qualifies for Shingles Vaccine? Yes   Zostavax completed No   Shingrix Completed?: No.    Education has been provided regarding the importance of this vaccine. Patient has been advised to call insurance company to determine out of pocket expense if  they have not yet received this vaccine. Advised may also receive vaccine at local pharmacy or Health Dept. Verbalized acceptance and understanding.  Screening Tests Health Maintenance  Topic Date Due   Zoster Vaccines- Shingrix (1 of 2) Never done   Medicare Annual Wellness (AWV)  07/10/2016   COVID-19 Vaccine (3 - Moderna risk series) 09/20/2019   INFLUENZA VACCINE  12/10/2022   DTaP/Tdap/Td (2 - Td or Tdap) 01/08/2025   Pneumonia Vaccine 73+ Years old (3 of 3 - PPSV23 or PCV20) 08/07/2025   Colonoscopy  01/05/2028   Hepatitis C Screening  Completed   HPV VACCINES  Aged Out    Health Maintenance  Health Maintenance Due  Topic Date Due   Zoster Vaccines- Shingrix (1 of 2) Never done   Medicare Annual Wellness (AWV)  07/10/2016   COVID-19 Vaccine (3 - Moderna risk series) 09/20/2019   INFLUENZA VACCINE  12/10/2022    Colorectal cancer screening: Type of screening: Colonoscopy. Completed 01/04/2018. Repeat every 10 years  Lung Cancer Screening: (Low Dose CT Chest recommended if Age 23-80 years, 20 pack-year currently smoking OR have quit w/in 15years.) does not qualify.    Additional Screening:  Hepatitis C Screening: does not qualify; Completed 04/01/2020  Vision Screening: Recommended annual ophthalmology exams for early detection of glaucoma and other disorders of the eye. Is the patient up to date with their annual eye exam?  Yes  Who is the provider or what is the name of the office in which the patient attends annual eye exams? [facility] If pt is not established with a provider, would they like to be referred to a provider to establish care? No .   Dental Screening: Recommended annual dental exams for proper oral hygiene  Diabetic Foot Exam: na  Community Resource Referral / Chronic Care Management: CRR required this visit?  No   CCM required this visit?  No     Plan:     I have personally reviewed and noted the following in the patient's  chart:   Medical and social history Use of alcohol, tobacco or illicit drugs  Current medications and supplements including opioid prescriptions. Patient is not currently taking opioid prescriptions. Functional ability and status Nutritional status Physical activity Advanced directives List of other physicians Hospitalizations, surgeries, and ER visits in previous 12 months Vitals Screenings to include cognitive, depression, and falls Referrals and appointments  In addition, I have reviewed and discussed with patient certain preventive protocols, quality metrics, and best practice recommendations. A written personalized care plan for preventive services as well as general preventive health recommendations were provided to patient.     Jordan Hawks Tiondra Fang, CMA   02/16/2023  After Visit Summary: (MyChart) Due to this being a telephonic visit, the after visit summary with patients personalized plan was offered to patient via MyChart

## 2023-02-22 NOTE — Telephone Encounter (Signed)
Pt has had frequent mild headaches since starting Eliquis (occurring approximately daily). His migraine-type symptoms ("shimmering" vision) have occurred 7-8 times in correlation with these headaches. Overall, he states these symptoms have gradually improved.  Pt denies stroke-like symptoms. Fatigue is his main concern at this time.  Pt states prior to Watchman procedure, he would typically ride his bike 15-25 miles. This weekend he could only tolerate a 5-mile ride. Pt also works part-time (a few days a week) and despite it being relatively sedentary work, he feels he may need to take more time off because he is so fatigued.  He is concerned about whether this would be related to the Eliquis or possibly to the procedure.  Pt states he is willing to tolerate Eliquis until the 45-day point, but would like guidance from provider as to how long he should expect to feel fatigued. Advised message will be forwarded to Arelia Longest, NP, for recommendations. Pt verbalizes understanding and agreement with plan.

## 2023-03-08 DIAGNOSIS — L081 Erythrasma: Secondary | ICD-10-CM | POA: Diagnosis not present

## 2023-03-08 DIAGNOSIS — L739 Follicular disorder, unspecified: Secondary | ICD-10-CM | POA: Diagnosis not present

## 2023-03-08 DIAGNOSIS — D1801 Hemangioma of skin and subcutaneous tissue: Secondary | ICD-10-CM | POA: Diagnosis not present

## 2023-03-08 DIAGNOSIS — D485 Neoplasm of uncertain behavior of skin: Secondary | ICD-10-CM | POA: Diagnosis not present

## 2023-03-08 DIAGNOSIS — L918 Other hypertrophic disorders of the skin: Secondary | ICD-10-CM | POA: Diagnosis not present

## 2023-03-08 DIAGNOSIS — Z85828 Personal history of other malignant neoplasm of skin: Secondary | ICD-10-CM | POA: Diagnosis not present

## 2023-03-08 DIAGNOSIS — L821 Other seborrheic keratosis: Secondary | ICD-10-CM | POA: Diagnosis not present

## 2023-03-08 DIAGNOSIS — L814 Other melanin hyperpigmentation: Secondary | ICD-10-CM | POA: Diagnosis not present

## 2023-03-08 DIAGNOSIS — L570 Actinic keratosis: Secondary | ICD-10-CM | POA: Diagnosis not present

## 2023-03-09 ENCOUNTER — Ambulatory Visit (INDEPENDENT_AMBULATORY_CARE_PROVIDER_SITE_OTHER): Payer: Medicare Other | Admitting: Family Medicine

## 2023-03-09 ENCOUNTER — Encounter (INDEPENDENT_AMBULATORY_CARE_PROVIDER_SITE_OTHER): Payer: Self-pay | Admitting: Family Medicine

## 2023-03-09 VITALS — BP 143/68 | HR 54 | Ht 75.0 in | Wt 272.0 lb

## 2023-03-09 DIAGNOSIS — E669 Obesity, unspecified: Secondary | ICD-10-CM | POA: Diagnosis not present

## 2023-03-09 DIAGNOSIS — I10 Essential (primary) hypertension: Secondary | ICD-10-CM | POA: Diagnosis not present

## 2023-03-09 DIAGNOSIS — E559 Vitamin D deficiency, unspecified: Secondary | ICD-10-CM | POA: Diagnosis not present

## 2023-03-09 DIAGNOSIS — Z6834 Body mass index (BMI) 34.0-34.9, adult: Secondary | ICD-10-CM | POA: Diagnosis not present

## 2023-03-09 MED ORDER — VITAMIN D (ERGOCALCIFEROL) 1.25 MG (50000 UNIT) PO CAPS
50000.0000 [IU] | ORAL_CAPSULE | ORAL | 0 refills | Status: DC
Start: 2023-03-09 — End: 2023-05-31

## 2023-03-09 NOTE — Progress Notes (Unsigned)
Chief Complaint:   OBESITY Jonathan Dyer is here to discuss his progress with his obesity treatment plan along with follow-up of his obesity related diagnoses. Jonathan Dyer is on the Category 3 Plan and states he is following his eating plan approximately 0% of the time. Jonathan Dyer states he is doing 0 minutes 0 times per week.  Today's visit was #: 16 Starting weight: 274 lbs Starting date: 01/27/2022 Today's weight: 272 lbs Today's date: 03/09/2023 Total lbs lost to date: 2 Total lbs lost since last in-office visit: 0  Interim History: Patient has not been able to concentrate on his weight loss.  He has been dealing with heart issues (Watchman procedure for A-fib).  Subjective:   1. Essential hypertension Patient's blood pressure is elevated today, but it is normally well-controlled.  He denies chest pain but notes fatigue.  He is status post surgery.  2. Vitamin D deficiency Patient is stable on vitamin D with no nausea, vomiting, or muscle weakness noted.  Assessment/Plan:   1. Essential hypertension Patient will continue with his diet, exercise, and weight loss.  We will recheck his blood pressure at his next visit in 1 month.  2. Vitamin D deficiency We will refill prescription vitamin D 50,000 IU every 7 days for 90 days.  - Vitamin D, Ergocalciferol, (DRISDOL) 1.25 MG (50000 UNIT) CAPS capsule; Take 1 capsule (50,000 Units total) by mouth every 7 (seven) days.  Dispense: 13 capsule; Refill: 0  3. BMI 34.0-34.9,adult  4. Obesity, Beginning BMI 34.25 Jonathan Dyer is currently in the action stage of change. As such, his goal is to continue with weight loss efforts. He has agreed to the Category 3 Plan.   Exercise goals: Patient is to slowly increase exercise as tolerated.   Behavioral modification strategies: increasing lean protein intake.  Jonathan Dyer has agreed to follow-up with our clinic in 4 weeks. He was informed of the importance of frequent follow-up visits to maximize his success with  intensive lifestyle modifications for his multiple health conditions.   Objective:   Blood pressure (!) 143/68, pulse (!) 54, height 6\' 3"  (1.905 m), weight 272 lb (123.4 kg), SpO2 97%. Body mass index is 34 kg/m.  Lab Results  Component Value Date   CREATININE 1.17 01/18/2023   BUN 15 01/18/2023   NA 143 01/18/2023   K 4.4 01/18/2023   CL 103 01/18/2023   CO2 26 01/18/2023   Lab Results  Component Value Date   ALT 18 01/05/2023   AST 17 01/05/2023   ALKPHOS 94 01/05/2023   BILITOT 0.6 01/05/2023   Lab Results  Component Value Date   HGBA1C 5.5 01/05/2023   HGBA1C 5.7 (H) 07/21/2022   HGBA1C 6.0 (H) 01/27/2022   HGBA1C 5.5 10/03/2019   HGBA1C 5.7 10/21/2018   Lab Results  Component Value Date   INSULIN 9.1 01/05/2023   INSULIN 11.5 07/21/2022   INSULIN 12.6 01/27/2022   Lab Results  Component Value Date   TSH 2.560 07/21/2022   Lab Results  Component Value Date   CHOL 161 01/05/2023   HDL 43 01/05/2023   LDLCALC 94 01/05/2023   TRIG 133 01/05/2023   CHOLHDL 3.7 01/05/2023   Lab Results  Component Value Date   VD25OH 42.9 01/05/2023   VD25OH 43.3 07/21/2022   VD25OH 39.9 01/27/2022   Lab Results  Component Value Date   WBC 5.9 01/18/2023   HGB 15.6 01/18/2023   HCT 47.4 01/18/2023   MCV 90 01/18/2023   PLT 285 01/18/2023  Lab Results  Component Value Date   IRON 86 07/21/2022   TIBC 280 07/21/2022   FERRITIN 228 07/21/2022   Attestation Statements:   Reviewed by clinician on day of visit: allergies, medications, problem list, medical history, surgical history, family history, social history, and previous encounter notes.   I, Burt Knack, am acting as transcriptionist for Quillian Quince, MD.  I have reviewed the above documentation for accuracy and completeness, and I agree with the above. -  Quillian Quince, MD

## 2023-03-16 NOTE — Progress Notes (Unsigned)
[facility]                                     Cardiology Office Note:    Date:  03/17/2023   ID:  Jonathan Dyer, DOB Aug 28, 1955, MRN [id number]  PCP:  Billie Lade, MD  [facility] Cardiologist:  Nona Dell, MD  [facility] Electrophysiologist:  Lanier Prude, MD   Referring MD: Billie Lade, MD   Chief Complaint  Patient presents with   1 month s/p LAAO    History of Present Illness:    Jonathan Dyer is a 67 y.o. male with a hx of paroxsymal atrial fibrillation, HLD with hx of statin intolerance, HTN, and dilated aortic aneurysm who underwent LAAO with Dr. Lalla Brothers and is here for 1 month follow up.     Jonathan Dyer is followed by Dr. Diona Browner for his cardiology care. He is very active at baseline and spoke to Dr. Diona Browner about coming off his anticoagulation. At referral he was felt to be a good candidate based on cardiac CT which showed anatomy suitable to proceed however also an elevated calcium score at 5250. He underwent stress testing 08/03/2022 that showed no evidence of ischemia and felt low risk.   He is now s/p LAAO with 27mm Watchman FLX Pro device on 02/04/23. He was restarted on Eliquis the evening of his procedure and has done well. Today he is here alone and reports he has had some fatigue since implant and feels it may be related to Eliquis. He has some concerns about his aneurysm as well but we discussed that it has been very stable over the years and will continue to be monitored by Dr. Diona Browner. Otherwise he denies chest pain, SOB, palpitations, LE edema, orthopnea, PND, dizziness, or syncope. Denies bleeding in stool or urine.    Past Medical History:  Diagnosis Date   Allergy    Arthritis    Atrial fibrillation (HCC)    Diagnosed 09/2009, on flecainide   Back pain    Bilateral swelling of feet    Bradycardia    Event monitor 2010: HR down to 45, asymptomatic.   Cataract    Coronary atherosclerosis of native  coronary artery    Nonobstructive and distal disease by cath 2005.    Essential hypertension    Hyperlipidemia    Joint pain    OSA (obstructive sleep apnea)    Osteoarthritis    Overweight    Prediabetes    Presence of Watchman left atrial appendage closure device 02/04/2023   Watchman 27mm FLX Pro placed by Dr. Lalla Brothers   Renal cyst    Ringing in ears    Sleep apnea    Vitamin D deficiency     Past Surgical History:  Procedure Laterality Date   BIOPSY  01/04/2018   Procedure: BIOPSY;  Surgeon: West Bali, MD;  Location: [facility];  Service: Endoscopy;;  ascending colon    CARDIAC CATHETERIZATION  2005   COLONOSCOPY  05/2006   SLF: 3 mm sigmoid: Tubular adenoma removed.   COLONOSCOPY N/A 05/06/2016   Dr. Darrick Penna: two sessile polyps in mid transverse colon and hepatic flexure (tubular adenomas). ext/int hemorrhoids. tortuous left colon.    COLONOSCOPY WITH PROPOFOL N/A 01/04/2018   Procedure: COLONOSCOPY WITH PROPOFOL;  Surgeon: West Bali, MD;  Location: [facility];  Service: Endoscopy;  Laterality: N/A;  2:30pm   KNEE ARTHROSCOPY     Left anterior cruciate ligament reconstruction     LEFT ATRIAL APPENDAGE OCCLUSION N/A 02/04/2023   Procedure: LEFT ATRIAL APPENDAGE OCCLUSION;  Surgeon: Lanier Prude, MD;  Location: [facility] INVAS[facility] CV [facility];  Service: Cardiovascular;  Laterality: N/A;   left shoulder surgery     dr . Molly Maduro collins    POLYPECTOMY  05/06/2016   Procedure: POLYPECTOMY;  Surgeon: West Bali, MD;  Location: [facility];  Service: Endoscopy;;  transverse colon polyp, hepatic flexure polyp,    Right shoulder surgery     ROBOT ASSISTED LAPAROSCOPIC NEPHRECTOMY Left 07/04/2018   Procedure: XI ROBOTIC ASSISTED LAPAROSCOPIC RENAL CYST DECORTICATION;  Surgeon: Malen Gauze, MD;  Location: [facility];  Service: Urology;  Laterality: Left;  3 HRSPROCEDURE: ROBOTIC RENAL CYST DECORTICATION   TEE WITHOUT CARDIOVERSION N/A 02/04/2023   Procedure:  TRANSESOPHAGEAL ECHOCARDIOGRAM;  Surgeon: Lanier Prude, MD;  Location: [facility] INVASIVE CV LAB;  Service: Cardiovascular;  Laterality: N/A;   TONSILLECTOMY     TOTAL KNEE ARTHROPLASTY      Current Medications: Current Meds  Medication Sig   amLODipine (NORVASC) 10 MG tablet TAKE 1 TABLET BY MOUTH DAILY   apixaban (ELIQUIS) 5 MG TABS tablet Take 1 tablet (5 mg total) by mouth 2 (two) times daily.   cephALEXin (KEFLEX) 500 MG capsule Take 1 capsule (500 mg total) by mouth as needed. Take one tablet by mouth, one hour prior to dental cleanings   chlorthalidone (HYGROTON) 25 MG tablet Take 0.5 tablets (12.5 mg total) by mouth daily as needed.   clindamycin (CLEOCIN T) 1 % lotion daily.   clopidogrel (PLAVIX) 75 MG tablet Take 1 tablet (75 mg total) by mouth daily.   Evolocumab (REPATHA SURECLICK) 140 MG/ML SOAJ Inject 140 mg into the skin every 14 (fourteen) days.   flecainide (TAMBOCOR) 50 MG tablet TAKE 1 TABLET BY MOUTH TWICE  DAILY   furosemide (LASIX) 40 MG tablet TAKE 1 TABLET BY MOUTH DAILY AS  NEEDED FOR FLUID   losartan (COZAAR) 100 MG tablet TAKE 1 TABLET BY MOUTH DAILY   Multiple Vitamin (MULTIVITAMIN) tablet Take 1 tablet by mouth daily.   omeprazole (PRILOSEC) 20 MG capsule TAKE 1 CAPSULE BY MOUTH DAILY AS NEEDED FOR ACID REFLUX,  INDIGESTION   potassium chloride (KLOR-CON) 10 MEQ tablet Take 1 tablet (10 mEq total) by mouth daily as needed.   tamsulosin (FLOMAX) 0.4 MG CAPS capsule Take 0.4 mg by mouth. Takes occasionally.   Vitamin D, Ergocalciferol, (DRISDOL) 1.25 MG (50000 UNIT) CAPS capsule Take 1 capsule (50,000 Units total) by mouth every 7 (seven) days.     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   Social Determinants of Health   Financial Resource Strain: Low Risk  (02/16/2023)   Overall Financial Resource Strain (CARDIA)    Difficulty of Paying Living Expenses: Not hard at all  Food Insecurity: No Food Insecurity (02/16/2023)   Hunger Vital Sign    Worried About Running Out of Food in the Last Year: Never true    Ran Out of Food in the Last Year: Never true  Transportation Needs: No Transportation Needs (02/16/2023)   PRAPARE - Administrator, Civil Service (Medical): No    Lack of Transportation (Non-Medical): No  Physical Activity: Sufficiently Active (02/16/2023)   Exercise Vital Sign    Days of Exercise per Week: 4 days    Minutes of Exercise per Session: 60 min  Stress: No Stress Concern Present (02/16/2023)   [employer] of Occupational Health - Occupational Stress Questionnaire    Feeling of Stress : Not at all  Social Connections: Moderately Integrated (02/16/2023)   Social Connection and Isolation Panel [NHANES]    Frequency of Communication with Friends and Family: More than three times a week    Frequency of Social Gatherings with Friends and Family: More than three times a week    Attends Religious Services: More than 4 times per year    Active Member of [employer] or Organizations: Yes    Attends Engineer, structural: More than 4 times per year    Marital Status: Divorced     Family History: The patient's family history includes  Arthritis in his sister; Cancer in his father; Diabetes in his mother and another family member; Heart disease in his father; Hyperlipidemia in his brother; Hypertension in his brother and father; Lung disease in his father; Obesity in his mother; Pulmonary embolism in his mother. There is no history of Colon cancer.  ROS:   Please see the history of present illness.    All other systems reviewed and are negative.  EKGs/Labs/Other Studies Reviewed:    The following studies were reviewed today:   Cardiac Studies & Procedures     STRESS TESTS  NM MYOCAR MULTI W/SPECT W 08/03/2022  Narrative   Findings are consistent with no ischemia. The study is low risk.   No ST deviation was noted. The ECG was negative for ischemia.  Low risk Duke treadmill score of 9.   LV perfusion is equivocal.  Small, mild intensity, mid to apical inferolateral defect that is predominantly fixed with partial reversibility suggestive of variable soft tissue attenuation artifact.  Importantly, no large ischemic territories are noted.   Left ventricular function is normal. Nuclear stress EF: 53 %.  Low risk study with Duke treadmill score of 9, no exercise-induced arrhythmias, and perfusion imaging most consistent with variable soft tissue attenuation.  No large ischemic territories are noted.  LVEF 53%.   ECHOCARDIOGRAM  ECHOCARDIOGRAM COMPLETE 04/21/2022  Narrative ECHOCARDIOGRAM REPORT    Patient Name:   Jonathan Dyer Date of Exam: 04/21/2022 Medical Rec #:  [id number]           Height:       75.0 in Accession #:    [id number]          Weight:       270.0 lb Date of Birth:  [birth year]           BSA:          2.493 m Patient Age:    1  years            BP:           132/70 mmHg Patient Gender: M                   HR:           52 bpm. Exam Location:  [place]  Procedure: 2D Echo, Cardiac Doppler, Color Doppler and Strain Analysis  Indications:    Dx: Paroxysmal atrial fibrillation (HCC) [I48.0  (ICD-10-CM)]; Pre-op evaluation [Z61.096 (ICD-10-CM)]  History:        Patient has prior history of Echocardiogram examinations, most recent 11/18/2016. CAD, Arrythmias:Atrial Fibrillation, Signs/Symptoms:Bacteremia; Risk Factors:Non-Smoker, Sleep Apnea, Hypertension and Dyslipidemia.  Sonographer:    Jake Seats RDMS, RVT, RDCS Referring Phys: [id number] Rossie Muskrat LAMBERT  IMPRESSIONS   1. Left ventricular ejection fraction, by estimation, is 55 to 60%. The left ventricle has normal function. The left ventricle has no regional wall motion abnormalities. Left ventricular diastolic parameters are indeterminate. The average left ventricular global longitudinal strain is -22.7 %. The global longitudinal strain is normal. 2. Right ventricular systolic function not well visualized but appears to be mildly reduced. The right ventricular size is mildly enlarged. 3. The mitral valve is grossly normal. Mild mitral valve regurgitation. No evidence of mitral stenosis. 4. The aortic valve is tricuspid. There is mild calcification of the aortic valve. Aortic valve regurgitation is not visualized. No aortic stenosis is present. 5. Aortic dilatation noted. There is mild dilatation of the aortic root, measuring 44 mm. There is mild dilatation of the ascending aorta, measuring 44 mm. There is mild dilatation of the aortic arch, measuring 40 mm.  Comparison(s): Prior images unable to be directly viewed, comparison made by report only. Changes from prior study are noted. Stable LVEF. Aortic dilatation progressed compared to prior Echo from 2018.  FINDINGS Left Ventricle: Left ventricular ejection fraction, by estimation, is 55 to 60%. The left ventricle has normal function. The left ventricle has no regional wall motion abnormalities. The average left ventricular global longitudinal strain is -22.7 %. The global longitudinal strain is normal. The left ventricular internal cavity size was normal in size. There  is no left ventricular hypertrophy. Left ventricular diastolic parameters are indeterminate.  Right Ventricle: The right ventricular size is mildly enlarged. Right vetricular wall thickness was not assessed. Right ventricular systolic function not well visualized but appears to be mildly reduced.  Left Atrium: Left atrial size was normal in size.  Right Atrium: Right atrial size was normal in size.  Pericardium: There is no evidence of pericardial effusion.  Mitral Valve: The mitral valve is grossly normal. Mild mitral annular calcification. Mild mitral valve regurgitation. No evidence of mitral valve stenosis.  Tricuspid Valve: The tricuspid valve is grossly normal. Tricuspid valve regurgitation is trivial. No evidence of tricuspid stenosis.  Aortic Valve: The aortic valve is tricuspid. There is mild calcification of the aortic valve. Aortic valve regurgitation is not visualized. No aortic stenosis is present. Aortic valve peak gradient measures 7.6 mmHg.  Pulmonic Valve: The pulmonic valve was grossly normal. Pulmonic valve regurgitation is trivial. No evidence of pulmonic stenosis.  Aorta: Aortic dilatation noted. There is mild dilatation of the aortic root, measuring 44 mm. There is mild dilatation of the ascending aorta, measuring 44 mm. There is mild dilatation of the aortic arch, measuring 40 mm.  Venous: The inferior vena cava was not well visualized.  IAS/Shunts: No atrial level shunt detected by color flow Doppler.  LEFT VENTRICLE PLAX 2D LVIDd:         4.80 cm      Diastology LVIDs:         3.50 cm      LV e' medial:    5.44 cm/s LV PW:         1.10 cm      LV E/e' medial:  10.2 LV IVS:        0.90 cm      LV e' lateral:   10.60 cm/s LVOT diam:     2.00 cm      LV E/e' lateral: 5.3 LV SV:         66 LV SV Index:   27           2D Longitudinal Strain LVOT Area:     3.14 cm     2D Strain GLS (A2C):   -21.8 % 2D Strain GLS (A3C):   -23.0 % 2D Strain GLS (A4C):   -23.2  % LV Volumes (MOD)            2D Strain GLS Avg:     -22.7 % LV vol d, MOD A2C: 174.0 ml LV vol d, MOD A4C: 183.0 ml LV vol s, MOD A2C: 72.4 ml LV vol s, MOD A4C: 72.2 ml LV SV MOD A2C:     101.6 ml LV SV MOD A4C:     183.0 ml LV SV MOD BP:      110.5 ml  RIGHT VENTRICLE RV S prime:     7.40 cm/s TAPSE (M-mode): 1.4 cm  LEFT ATRIUM             Index        RIGHT ATRIUM           Index LA diam:        3.30 cm 1.32 cm/m   RA Area:     15.90 cm LA Vol (A2C):   63.1 ml 25.31 ml/m  RA Volume:   44.10 ml  17.69 ml/m LA Vol (A4C):   38.2 ml 15.32 ml/m LA Biplane Vol: 50.5 ml 20.26 ml/m AORTIC VALVE AV Area (Vmax): 2.30 cm AV Vmax:        138.00 cm/s AV Peak Grad:   7.6 mmHg LVOT Vmax:      101.00 cm/s LVOT Vmean:     65.600 cm/s LVOT VTI:       0.211 m  AORTA Ao Root diam: 4.40 cm Ao Asc diam:  4.40 cm  MITRAL VALVE               TRICUSPID VALVE MV Area (PHT): 2.42 cm    TR Peak grad:   13.1 mmHg MV Decel Time: 314 msec    TR Vmax:        181.00 cm/s MR Peak grad: 33.8 mmHg MR Vmax:      290.50 cm/s  SHUNTS MV E velocity: 55.70 cm/s  Systemic VTI:  0.21 m MV A velocity: 47.10 cm/s  Systemic Diam: 2.00 cm MV E/A ratio:  1.18  Vishnu Priya Mallipeddi Electronically signed by Winfield Rast Mallipeddi Signature Date/Time: 04/21/2022/2:58:34 PM    Final   TEE  ECHO TEE 02/04/2023  Narrative TRANSESOPHOGEAL ECHO REPORT    Patient Name:   Jonathan Dyer Date of Exam: 02/04/2023 Medical Rec #:  [id number]           Height:       75.0 in Accession #:  [id number]          Weight:       266.0 lb Date of Birth:  [birth year]           BSA:          2.477 m Patient Age:    44 years            BP:           164/87 mmHg Patient Gender: M                   HR:           81 bpm. Exam Location:  Inpatient  Procedure: Transesophageal Echo, Cardiac Doppler, Color Doppler and 3D Echo  Indications:     Watchman  History:         Patient has prior history of  Echocardiogram examinations, most recent 04/21/2022.  Sonographer:     Darlys Gales Referring Phys:  [id number] Lanier Prude Diagnosing Phys: Riley Lam MD  PROCEDURE: After discussion of the risks and benefits of a TEE, an informed consent was obtained from the patient. The transesophogeal probe was passed without difficulty through the esophogus of the patient. Sedation performed by different physician. The patient developed no complications during the procedure.  IMPRESSIONS P  1. Prior to procedure, patient had a patent left atrial appendage. Distal to the ostium, sufficient measurements for a 27 mm Watchman FLX Pro. 2. Atrial septal aneursym, lipomatous interatrial septal hypertrophy, and patient foramen ovale noted. Transeptal puncture with no complications. 3. A 27 mm Watchman FLX Pro placed. No peri-device leak. No mitral shoulder. No thrombus. Average compression ~ 19%. 4. No change to pericardial effusion. Expected iatrogenic atrial septal defect with all left to right shunting. Successful Watchman placement. 5. Left ventricular ejection fraction, by estimation, is 55 to 60%. The left ventricle has normal function. 6. Right ventricular systolic function is normal. The right ventricular size is normal. 7. Left atrial size was mild to moderately dilated. No left atrial/left atrial appendage thrombus was detected. 8. A small pericardial effusion is present. The pericardial effusion is posterior to the left ventricle. 9. The mitral valve is abnormal. Mild mitral valve regurgitation. No evidence of mitral stenosis. 10. The aortic valve is tricuspid. Aortic valve regurgitation is not visualized. No aortic stenosis is present. 11. Aortic dilatation noted. Aneurysm of the ascending aorta, measuring 45 mm. There is mild dilatation of the aortic root, measuring 41 mm. 12. The inferior vena cava is normal in size with greater than 50% respiratory variability, suggesting right  atrial pressure of 3 mmHg. 13. Evidence of atrial level shunting detected by color flow Doppler.  FINDINGS Left Ventricle: Left ventricular ejection fraction, by estimation, is 55 to 60%. The left ventricle has normal function. The left ventricular internal cavity size was normal in size.  Right Ventricle: The right ventricular size is normal. No increase in right ventricular wall thickness. Right ventricular systolic function is normal.  Left Atrium: Left atrial size was mild to moderately dilated. No left atrial/left atrial appendage thrombus was detected.  Right Atrium: Right atrial size was normal in size.  Pericardium: A small pericardial effusion is present. The pericardial effusion is posterior to the left ventricle.  Mitral Valve: Mitral valve prolapse. The mitral valve is abnormal. Mild mitral valve regurgitation. No evidence of mitral valve stenosis.  Tricuspid Valve: The tricuspid valve is normal in structure. Tricuspid valve regurgitation is trivial. No evidence of tricuspid stenosis.  Aortic Valve:  The aortic valve is tricuspid. Aortic valve regurgitation is not visualized. No aortic stenosis is present.  Pulmonic Valve: The pulmonic valve was normal in structure. Pulmonic valve regurgitation is not visualized. No evidence of pulmonic stenosis.  Aorta: Aortic dilatation noted. There is mild dilatation of the aortic root, measuring 41 mm. There is an aneurysm involving the ascending aorta measuring 45 mm.  Venous: The inferior vena cava is normal in size with greater than 50% respiratory variability, suggesting right atrial pressure of 3 mmHg.  IAS/Shunts: The interatrial septum is aneurysmal. The interatrial septum appears to be lipomatous. Evidence of atrial level shunting detected by color flow Doppler.  Riley Lam MD Electronically signed by Riley Lam MD Signature Date/Time: 02/04/2023/11:13:59 AM    Final   MONITORS  LONG TERM MONITOR (3-14  DAYS) 09/26/2020  Narrative ZIO XT reviewed.  6 days 21 hours analyzed.  Predominant rhythm is sinus with heart rate ranging from 45 bpm up to 133 bpm and average heart rate 59 bpm.  There were rare PACs including couplets and triplets representing less than 1% total beats.  There were rare PVCs representing less than 1% total beats.  There was one brief episode of SVT lasting only 6 beats, otherwise no significant arrhythmias or pauses.           EKG:  EKG is not ordered today.    Recent Labs: 07/21/2022: Magnesium 2.1; TSH 2.560 01/05/2023: ALT 18 01/18/2023: BUN 15; Creatinine, Ser 1.17; Hemoglobin 15.6; Platelets 285; Potassium 4.4; Sodium 143   Recent Lipid Panel    Component Value Date/Time   CHOL 161 01/05/2023 1114   TRIG 133 01/05/2023 1114   HDL 43 01/05/2023 1114   CHOLHDL 3.7 01/05/2023 1114   CHOLHDL 6 04/01/2021 0813   VLDL 24.4 04/01/2021 0813   LDLCALC 94 01/05/2023 1114   LDLCALC 131 (H) 10/03/2019 0926   Risk Assessment/Calculations:    CHA2DS2-VASc Score = 4  The patient's score is based upon: CHF History: 0 HTN History: 1 Diabetes History: 1 Stroke History: 0 Vascular Disease History: 1 Age Score: 1 Gender Score: 0  Physical Exam:    VS:  BP 116/72   Pulse (!) 56   Ht 6\' 3"  (1.905 m)   Wt 277 lb 12.8 oz (126 kg)   SpO2 96%   BMI 34.72 kg/m     Wt Readings from Last 3 Encounters:  03/17/23 277 lb 12.8 oz (126 kg)  03/09/23 272 lb (123.4 kg)  02/16/23 260 lb (117.9 kg)    General: Well developed, well nourished, NAD Lungs:Clear to ausculation bilaterally. No wheezes, rales, or rhonchi. Breathing is unlabored. Cardiovascular: RRR with S1 S2. No murmurs Extremities: No edema.  Neuro: Alert and oriented. No focal deficits. No facial asymmetry. MAE spontaneously. Psych: Responds to questions appropriately with normal affect.    ASSESSMENT/PLAN:    Paroxsymal atrial fibrillation: s/p LAAO with 27mm Watchman FLX Pro device on 02/04/23. He was  restarted on Eliquis the evening of his procedure and has done well. He can now stop Eliquis after his evening dose tonight then stop. He will start Plavix 75mg  daily tomorrow and continue through 6 months of therapy. He will also require dental SBE through 6 months as well. Given multiple allergies, will use cephalexin. CT instructions reviewed with understanding. Obtain BMET and add CBC given non-specific c/o fatigue. Plan 6 month follow up with our team. Will follow results.   Coronary calcifications on CT: s/p pre Watchman stress testing with no evidence  of ischemia noted. Denies anginal symptoms. Once Plavix therapy complete, will add ASA 81mg  daily.    Dilated Aortic aneurysm: Measures 44mm on recent CT. Continue to follow closely with annual surveillance imaging. Maintain good BP control.    HLD: Continue Repatha   HTN: Stable with no changes needed at this time.   Medication Adjustments/Labs and Tests Ordered: Current medicines are reviewed at length with the patient today.  Concerns regarding medicines are outlined above.  Orders Placed This Encounter  Procedures   CBC   Basic metabolic panel   Meds ordered this encounter  Medications   cephALEXin (KEFLEX) 500 MG capsule    Sig: Take 1 capsule (500 mg total) by mouth as needed. Take one tablet by mouth, one hour prior to dental cleanings    Dispense:  3 capsule    Refill:  0    Order Specific Question:   Supervising Provider    Answer:   COOPER, MICHAEL [[id number]]   clopidogrel (PLAVIX) 75 MG tablet    Sig: Take 1 tablet (75 mg total) by mouth daily.    Dispense:  90 tablet    Refill:  3    Patient Instructions  Medication Instructions:  Your physician has recommended you make the following change in your medication:  1) STOP taking Eliquis 2) START taking Plavix 75 mg daily   *If you need a refill on your cardiac medications before your next appointment, please call your pharmacy*  Lab Work: TODAY: BMET and CBC If you  have labs (blood work) drawn today and your tests are completely normal, you will receive your results only by: MyChart Message (if you have MyChart) OR A paper copy in the mail If you have any lab test that is abnormal or we need to change your treatment, we will call you to review the results.  Follow-Up: At [facility], you and your health needs are our priority.  As part of our continuing mission to provide you with exceptional heart care, we have created designated Provider Care Teams.  These Care Teams include your primary Cardiologist (physician) and Advanced Practice Providers (APPs -  Physician Assistants and Nurse Practitioners) who all work together to provide you with the care you need, when you need it.  Your next appointment:   6 months  Provider:   [name], NP       Signed, [name], NP  03/17/2023 12:13 PM    [facility]

## 2023-03-17 ENCOUNTER — Ambulatory Visit: Payer: Medicare Other | Attending: Cardiology | Admitting: Cardiology

## 2023-03-17 VITALS — BP 116/72 | HR 56 | Ht 75.0 in | Wt 277.8 lb

## 2023-03-17 DIAGNOSIS — I10 Essential (primary) hypertension: Secondary | ICD-10-CM

## 2023-03-17 DIAGNOSIS — E782 Mixed hyperlipidemia: Secondary | ICD-10-CM | POA: Diagnosis not present

## 2023-03-17 DIAGNOSIS — Z95818 Presence of other cardiac implants and grafts: Secondary | ICD-10-CM | POA: Diagnosis not present

## 2023-03-17 DIAGNOSIS — I4819 Other persistent atrial fibrillation: Secondary | ICD-10-CM | POA: Diagnosis not present

## 2023-03-17 DIAGNOSIS — I7121 Aneurysm of the ascending aorta, without rupture: Secondary | ICD-10-CM

## 2023-03-17 DIAGNOSIS — I2510 Atherosclerotic heart disease of native coronary artery without angina pectoris: Secondary | ICD-10-CM | POA: Diagnosis not present

## 2023-03-17 MED ORDER — CLOPIDOGREL BISULFATE 75 MG PO TABS: 75.0000 mg | ORAL_TABLET | Freq: Every day | ORAL | 3 refills | Status: DC

## 2023-03-17 MED ORDER — CEPHALEXIN 500 MG PO CAPS: 500.0000 mg | ORAL_CAPSULE | ORAL | 0 refills | Status: AC | PRN

## 2023-03-17 NOTE — Patient Instructions (Addendum)
Medication Instructions:  Your physician has recommended you make the following change in your medication:  1) STOP taking Eliquis 2) START taking Plavix 75 mg daily   *If you need a refill on your cardiac medications before your next appointment, please call your pharmacy*  Lab Work: TODAY: BMET and CBC If you have labs (blood work) drawn today and your tests are completely normal, you will receive your results only by: MyChart Message (if you have MyChart) OR A paper copy in the mail If you have any lab test that is abnormal or we need to change your treatment, we will call you to review the results.  Follow-Up: At [facility], you and your health needs are our priority.  As part of our continuing mission to provide you with exceptional heart care, we have created designated Provider Care Teams.  These Care Teams include your primary Cardiologist (physician) and Advanced Practice Providers (APPs -  Physician Assistants and Nurse Practitioners) who all work together to provide you with the care you need, when you need it.  Your next appointment:   6 months  Provider:   [name], NP

## 2023-03-18 LAB — CBC
Hematocrit: 45.8 % (ref 37.5–51.0)
Hemoglobin: 15.5 g/dL (ref 13.0–17.7)
MCH: 30.5 pg (ref 26.6–33.0)
MCHC: 33.8 g/dL (ref 31.5–35.7)
MCV: 90 fL (ref 79–97)
Platelets: 283 10*3/uL (ref 150–450)
RBC: 5.08 x10E6/uL (ref 4.14–5.80)
RDW: 12.2 % (ref 11.6–15.4)
WBC: 7 10*3/uL (ref 3.4–10.8)

## 2023-03-18 LAB — BASIC METABOLIC PANEL
BUN/Creatinine Ratio: 17 (ref 10–24)
BUN: 21 mg/dL (ref 8–27)
CO2: 25 mmol/L (ref 20–29)
Calcium: 9.4 mg/dL (ref 8.6–10.2)
Chloride: 103 mmol/L (ref 96–106)
Creatinine, Ser: 1.26 mg/dL (ref 0.76–1.27)
Glucose: 93 mg/dL (ref 70–99)
Potassium: 4.4 mmol/L (ref 3.5–5.2)
Sodium: 141 mmol/L (ref 134–144)
eGFR: 62 mL/min/{1.73_m2} (ref >59)

## 2023-03-29 ENCOUNTER — Other Ambulatory Visit: Payer: Self-pay | Admitting: Urology

## 2023-03-29 DIAGNOSIS — C61 Malignant neoplasm of prostate: Secondary | ICD-10-CM

## 2023-04-01 ENCOUNTER — Telehealth: Payer: Self-pay | Admitting: *Deleted

## 2023-04-01 NOTE — Telephone Encounter (Signed)
   Pre-operative Risk Assessment    Patient Name: Jonathan Dyer  DOB: 04-28-1956 MRN: [id number]   Last OV: Sharlene Dory, NP 11/30/2022 Upcoming OV: None  Request for Surgical Clearance    Procedure:   MRI Fusion Biopsy  Date of Surgery:  Clearance TBD                                 Surgeon:  Dr. Berniece Salines Surgeon's Group or Practice Name:  [facility] Phone number:  [phone] Fax number:  [phone]   Type of Clearance Requested:   - Medical  - Pharmacy:  Hold Clopidogrel (Plavix) and Apixaban (Eliquis) 5 days prior.   Type of Anesthesia:  Not Indicated   Additional requests/questions:    Signed, Emmit Pomfret   04/01/2023, 10:25 AM

## 2023-04-02 ENCOUNTER — Ambulatory Visit: Payer: Medicare Other | Admitting: Nurse Practitioner

## 2023-04-05 ENCOUNTER — Ambulatory Visit (INDEPENDENT_AMBULATORY_CARE_PROVIDER_SITE_OTHER): Payer: Medicare Other | Admitting: Family Medicine

## 2023-04-05 ENCOUNTER — Encounter (INDEPENDENT_AMBULATORY_CARE_PROVIDER_SITE_OTHER): Payer: Self-pay | Admitting: Family Medicine

## 2023-04-05 VITALS — BP 144/81 | HR 61 | Temp 97.7°F | Ht 75.0 in | Wt 275.0 lb

## 2023-04-05 DIAGNOSIS — Z6834 Body mass index (BMI) 34.0-34.9, adult: Secondary | ICD-10-CM

## 2023-04-05 DIAGNOSIS — E669 Obesity, unspecified: Secondary | ICD-10-CM

## 2023-04-05 DIAGNOSIS — I10 Essential (primary) hypertension: Secondary | ICD-10-CM

## 2023-04-05 DIAGNOSIS — R7303 Prediabetes: Secondary | ICD-10-CM

## 2023-04-05 DIAGNOSIS — M13862 Other specified arthritis, left knee: Secondary | ICD-10-CM | POA: Diagnosis not present

## 2023-04-05 DIAGNOSIS — M1711 Unilateral primary osteoarthritis, right knee: Secondary | ICD-10-CM | POA: Diagnosis not present

## 2023-04-05 DIAGNOSIS — I480 Paroxysmal atrial fibrillation: Secondary | ICD-10-CM

## 2023-04-05 DIAGNOSIS — E559 Vitamin D deficiency, unspecified: Secondary | ICD-10-CM | POA: Diagnosis not present

## 2023-04-05 DIAGNOSIS — M7061 Trochanteric bursitis, right hip: Secondary | ICD-10-CM | POA: Diagnosis not present

## 2023-04-05 DIAGNOSIS — M25562 Pain in left knee: Secondary | ICD-10-CM | POA: Diagnosis not present

## 2023-04-05 NOTE — Telephone Encounter (Signed)
Per office note Jonathan Dyer 03/17/23, Eliquis d/c'd that day.

## 2023-04-05 NOTE — Progress Notes (Signed)
Jonathan Dyer, D.O.  ABFM, ABOM Specializing in Clinical Bariatric Medicine  Office located at: [address] [place], [place]  [address]   Assessment and Plan:  Return for fasting labs next OV.  FOR THE DISEASE OF OBESITY: Obesity, Beginning BMI 34.25 BMI 34.0-34.9,adult-current bmi  34.37 Since last office visit on 03/09/2023 patient's  Muscle mass has decreased by 0.4lb. Fat mass has increased by 3.6lb. Total body water has increased by 1.2lb.  Counseling done on how various foods will affect these numbers and how to maximize success  Total lbs lost to date: +1 Total weight loss percentage to date: +0.36%    Recommended Dietary Goals Jonathan Dyer is currently in the action stage of change. As such, his goal is to continue weight management plan.  He has agreed to: continue current plan and the low-carb meal plan   Behavioral Intervention We discussed the following today: continue to work on implementation of reduced calorie nutritional plan, planning for success, and continue to work on maintaining a reduced calorie state, getting the recommended amount of protein, incorporating whole foods, making healthy choices, staying well hydrated and practicing mindfulness when eating.  Additional resources provided today:  Low-carb meal plan and Holiday eating strategies  Evidence-based interventions for health behavior change were utilized today including the discussion of self monitoring techniques, problem-solving barriers and SMART goal setting techniques.   Regarding patient's less desirable eating habits and patterns, we employed the technique of small changes.   Pt will specifically work on: Restarting Category 3 and Low carb meal plan alternating weeks for next visit.    Recommended Physical Activity Goals Jonathan Dyer has been advised to work up to 150 minutes of moderate intensity aerobic activity a week and strengthening exercises 2-3 times per week for cardiovascular health,  weight loss maintenance and preservation of muscle mass.   He has agreed to :  Think about enjoyable ways to increase daily physical activity and overcoming barriers to exercise and Increase physical activity in their day and reduce sedentary time (increase NEAT).   Pharmacotherapy We discussed various medication options to help Jonathan Dyer with his weight loss efforts and we both agreed to : continue with nutritional and behavioral strategies   FOR ASSOCIATED CONDITIONS ADDRESSED TODAY: Vitamin D deficiency Assessment & Plan: Condition is Not optimized.. This is being treated with ERGO 50K lU once daily. He denies any adverse side effects on this.   Plan: - I discussed the importance of vitamin D to the patient's health and wellness. Weight loss will likely improve availability of vitamin D, thus encouraged Jonathan Dyer to continue with meal plan and their weight loss efforts to further improve this condition.  Thus, we will need to monitor levels regularly (every 3-4 mo on average) to keep levels within normal limits and prevent over supplementation.   Essential hypertension Assessment & Plan: Condition is Not optimized.[name] Pt BP is elevated at 144/81 as of today. This is being treated with Norvasc, Cozaar, and Lasix. He tolerates these without difficulty. He is asymptomatic and denies any dizziness but does have some fatigue still.  Last 3 blood pressure readings in our office are as follows: BP Readings from Last 3 Encounters:  04/05/23 (!) 144/81  03/17/23 116/72  03/09/23 (!) 143/68   Plan: - Continue Norvasc 10mg , Cozaar 100mg , and Lasix 40mg . Lifestyle changes such as following our low salt, heart healthy meal plan and engaging in a regular exercise program discussed. Ambulatory blood pressure monitoring encouraged.  Reminded patient that  if they ever feel poorly in any way, to check their blood pressure and pulse as well. We will continue to monitor closely alongside PCP/ specialists.  Pt reminded  to also f/up with those individuals as instructed by them.    Prediabetes Assessment & Plan: Condition is Not at goal.. This is diet/exercise controlled. He endorses still having hunger and cravings since surgery and not following the meal plan.  Lab Results  Component Value Date   HGBA1C 5.5 01/05/2023   HGBA1C 5.7 (H) 07/21/2022   HGBA1C 6.0 (H) 01/27/2022   INSULIN 9.1 01/05/2023   INSULIN 11.5 07/21/2022   INSULIN 12.6 01/27/2022    Plan: - I stressed importance of dietary and lifestyle modifications to result in weight loss as first line txmnt. Continue to decrease simple carbs/ sugars; increase fiber and proteins -> follow his meal plan. Anticipatory guidance given. Jonathan Dyer will continue to work on weight loss and exercise. We will recheck A1c and fasting insulin level in approximately 3 months from last check, or as deemed appropriate.    PAF (paroxysmal atrial fibrillation) [name]) Assessment & Plan: 02/04/2023 he had a left atrial appendage inclusion with placement of an watchman and has been stable every since he was taken off Eliquis and was placed on Plavix for the next 6 months. After this he will be switched to Aspirin.    Plan: Continue follow-up with PCP and Specialist as directed. Notes reviewed by cardiology.    Other specified arthritis, left knee Assessment: Condition is Not optimized.. Pt has started to bike again and does about of biking. He informed me that he noticed after biking a week ago he began to experience knee pain. He has a upcoming appt with his orthopedist today.   Plan: - Continue follow-up with Specialist as needed. I informed him that 1lb of extra weight is equivalent to 5-6lbs on the knee.    Follow up:   Return in about 1 month (around 05/05/2023). He was informed of the importance of frequent follow up visits to maximize his success with intensive lifestyle modifications for his multiple health conditions.  Subjective:   Chief complaint:  Obesity Jonathan Dyer is here to discuss his progress with his obesity treatment plan. He is on the the Category 3 Plan and states he is following his eating plan approximately 0% of the time. He states he is not exercising.  Interval History:  Jonathan Dyer is here for a follow up office visit. Since last OV,  he has been ok. He admits to not doing what he should as in following the plan and exercising. He notes following the plan well before September when he had heart surgery and since then he could not get back on plan. He states that when on Eliquis this did not make him feel the best but this has improved since stopping. He has tried to start biking but has noticed improved energy but pain in his knees. He informed me that weighing himself will help keep him accountable.    Barriers identified: orthopedic problems, medical conditions or chronic pain affecting mobility and medical comorbidities.   Pharmacotherapy for weight loss: He is currently taking no anti-obesity medication.   Review of Systems:  Pertinent positives were addressed with patient today.  Reviewed by clinician on day of visit: allergies, medications, problem list, medical history, surgical history, family history, social history, and previous encounter notes.  Weight Summary and Biometrics   Weight Lost Since Last Visit: 0lb  Weight Gained  Since Last Visit: 3lb    Vitals Temp: 97.7 F (36.5 C) BP: (!) 144/81 Pulse Rate: 61 SpO2: 99 %   Anthropometric Measurements Height: 6\' 3"  (1.905 m) Weight: 275 lb (124.7 kg) BMI (Calculated): 34.37 Weight at Last Visit: 272lb Weight Lost Since Last Visit: 0lb Weight Gained Since Last Visit: 3lb Starting Weight: 274lb   Body Composition  Body Fat %: 35.1 % Fat Mass (lbs): 96.6 lbs Muscle Mass (lbs): 170 lbs Total Body Water (lbs): 121 lbs Visceral Fat Rating : 21   Other Clinical Data Fasting: no Labs: no Today's Visit #: 17 Starting Date:  01/27/22    Objective:   PHYSICAL EXAM: Blood pressure (!) 144/81, pulse 61, temperature 97.7 F (36.5 C), height 6\' 3"  (1.905 m), weight 275 lb (124.7 kg), SpO2 99%. Body mass index is 34.37 kg/m.  General: he is overweight, cooperative and in no acute distress. PSYCH: Has normal mood, affect and thought process.   HEENT: EOMI, sclerae are anicteric. Lungs: Normal breathing effort, no conversational dyspnea. Extremities: Moves * 4 Neurologic: A and O * 3, good insight  DIAGNOSTIC DATA REVIEWED: BMET    Component Value Date/Time   NA 141 03/17/2023 1048   K 4.4 03/17/2023 1048   CL 103 03/17/2023 1048   CO2 25 03/17/2023 1048   GLUCOSE 93 03/17/2023 1048   GLUCOSE 86 04/01/2021 0813   BUN 21 03/17/2023 1048   CREATININE 1.26 03/17/2023 1048   CREATININE 1.34 (H) 10/31/2020 0946   CALCIUM 9.4 03/17/2023 1048   GFRNONAA 60 (L) 02/04/2021 0814   GFRNONAA 54 (L) 10/31/2020 0946   GFRAA 63 10/31/2020 0946   Lab Results  Component Value Date   HGBA1C 5.5 01/05/2023   HGBA1C 5.7 10/21/2018   Lab Results  Component Value Date   INSULIN 9.1 01/05/2023   INSULIN 12.6 01/27/2022   Lab Results  Component Value Date   TSH 2.560 07/21/2022   CBC    Component Value Date/Time   WBC 7.0 03/17/2023 1048   WBC 6.1 06/28/2018 0909   RBC 5.08 03/17/2023 1048   RBC 4.92 10/21/2018 0000   HGB 15.5 03/17/2023 1048   HCT 45.8 03/17/2023 1048   PLT 283 03/17/2023 1048   MCV 90 03/17/2023 1048   MCH 30.5 03/17/2023 1048   MCH 31.0 06/28/2018 0909   MCHC 33.8 03/17/2023 1048   MCHC 33.9 06/28/2018 0909   RDW 12.2 03/17/2023 1048   Iron Studies    Component Value Date/Time   IRON 86 07/21/2022 0802   TIBC 280 07/21/2022 0802   FERRITIN 228 07/21/2022 0802   IRONPCTSAT 31 07/21/2022 0802   Lipid Panel     Component Value Date/Time   CHOL 161 01/05/2023 1114   TRIG 133 01/05/2023 1114   HDL 43 01/05/2023 1114   CHOLHDL 3.7 01/05/2023 1114   CHOLHDL 6 04/01/2021  0813   VLDL 24.4 04/01/2021 0813   LDLCALC 94 01/05/2023 1114   LDLCALC 131 (H) 10/03/2019 0926   Hepatic Function Panel     Component Value Date/Time   PROT 7.1 01/05/2023 1114   ALBUMIN 4.4 01/05/2023 1114   AST 17 01/05/2023 1114   ALT 18 01/05/2023 1114   ALKPHOS 94 01/05/2023 1114   BILITOT 0.6 01/05/2023 1114      Component Value Date/Time   TSH 2.560 07/21/2022 0802   Nutritional Lab Results  Component Value Date   VD25OH 42.9 01/05/2023   VD25OH 43.3 07/21/2022   VD25OH 39.9 01/27/2022  Attestations:   I, Clinical biochemist, acting as a Stage manager for [employer], DO., have compiled all relevant documentation for today's office visit on behalf of Thomasene Lot, DO, while in the presence of [employer], DO.  Reviewed by clinician on day of visit: allergies, medications, problem list, medical history, surgical history, family history, social history, and previous encounter notes pertinent to patient's obesity diagnosis. preparing to see patient (e.g. review and interpretation of tests, old notes ), obtaining and/or reviewing separately obtained history, performing a medically appropriate examination or evaluation, counseling and educating the patient, ordering medications, test or procedures, documenting clinical information in the electronic or other health care record, and independently interpreting results and communicating results to the patient, family, or caregiver   I have reviewed the above documentation for accuracy and completeness, and I agree with the above. Jonathan Dyer, D.O.  The 21st Century Cures Act was signed into law in 2016 which includes the topic of electronic health records.  This provides immediate access to information in MyChart.  This includes consultation notes, operative notes, office notes, lab results and pathology reports.  If you have any questions about what you read please let us know at your next visit so we can discuss  your concerns and take corrective action if need be.  We are right here with you.

## 2023-04-06 ENCOUNTER — Other Ambulatory Visit (HOSPITAL_COMMUNITY): Payer: Medicare Other

## 2023-04-06 NOTE — Telephone Encounter (Signed)
Of course. Yes, his Eliquis has been stopped and plan from a Watchman standpoint is Plavix through 07/2023. Typically we do not like to hold the Plavix until the post implant CT is performed and shows no leak or device thrombus. Looks like his CT is scheduled 12/9. If that looks good, then he woul dbe able to hold the Plavix short term for the procedure then restart once stable from a bleeding standpoint.

## 2023-04-07 ENCOUNTER — Other Ambulatory Visit (INDEPENDENT_AMBULATORY_CARE_PROVIDER_SITE_OTHER): Payer: Self-pay | Admitting: Family Medicine

## 2023-04-07 DIAGNOSIS — E559 Vitamin D deficiency, unspecified: Secondary | ICD-10-CM

## 2023-04-19 ENCOUNTER — Ambulatory Visit (HOSPITAL_COMMUNITY)
Admission: RE | Admit: 2023-04-19 | Discharge: 2023-04-19 | Disposition: A | Discharge status: Home / Self Care | Payer: Medicare Other | Source: Ambulatory Visit | Attending: Cardiology | Admitting: Cardiology

## 2023-04-19 DIAGNOSIS — Z95818 Presence of other cardiac implants and grafts: Secondary | ICD-10-CM | POA: Diagnosis not present

## 2023-04-19 DIAGNOSIS — I4892 Unspecified atrial flutter: Secondary | ICD-10-CM | POA: Diagnosis not present

## 2023-04-19 DIAGNOSIS — I4891 Unspecified atrial fibrillation: Secondary | ICD-10-CM | POA: Insufficient documentation

## 2023-04-19 MED ORDER — IOHEXOL 350 MG/ML SOLN
100.0000 mL | Freq: Once | INTRAVENOUS | Status: AC | PRN
  Administered 2023-04-19: 100 mL via INTRAVENOUS

## 2023-04-20 NOTE — Telephone Encounter (Signed)
   Patient Name: Jonathan Dyer  DOB: 1955-05-13 MRN: [id number]  Primary Cardiologist: Nona Dell, MD  Chart reviewed as part of pre-operative protocol coverage. Pre-op clearance already addressed by colleagues in earlier phone notes. To summarize recommendations:  -I reviewed CT results from yesterday:  IMPRESSION: 1. Successful left atrial appendage occlusion. 2. Absence of peridevice leak. 3. Absence of device-related thrombus. 4. Stable device position without evidence of migration or embolization. 5. 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 Consider repeat imaging study in one year to follow for progression.   Riley Lam, MD  Per [name], NP if absence of perivalvular leak and absence of device related thrombus, he can hold Plavix x 5 days for procedure and resume when medically safe to do so.  He is not on Eliquis.  When he was seen in the office a month ago his only complaint was some nonspecific fatigue.  Otherwise, was asymptomatic from a CV standpoint.  At this point, okay to go forward with upcoming procedure without any further cardiovascular testing.  Will route this bundled recommendation to requesting provider via Epic fax function and remove from pre-op pool. Please call with questions.  Sharlene Dory, PA-C 04/20/2023, 9:27 AM

## 2023-04-21 ENCOUNTER — Ambulatory Visit
Admission: RE | Admit: 2023-04-21 | Discharge: 2023-04-21 | Disposition: A | Discharge status: Home / Self Care | Payer: Medicare Other | Source: Ambulatory Visit | Attending: Urology | Admitting: Urology

## 2023-04-21 DIAGNOSIS — C61 Malignant neoplasm of prostate: Secondary | ICD-10-CM

## 2023-04-21 MED ORDER — GADOPICLENOL 0.5 MMOL/ML IV SOLN
10.0000 mL | Freq: Once | INTRAVENOUS | Status: AC | PRN
  Administered 2023-04-21: 10 mL via INTRAVENOUS

## 2023-05-03 ENCOUNTER — Ambulatory Visit (INDEPENDENT_AMBULATORY_CARE_PROVIDER_SITE_OTHER): Payer: Medicare Other | Admitting: Family Medicine

## 2023-05-31 ENCOUNTER — Ambulatory Visit (INDEPENDENT_AMBULATORY_CARE_PROVIDER_SITE_OTHER): Payer: Medicare Other | Admitting: Family Medicine

## 2023-05-31 ENCOUNTER — Encounter (INDEPENDENT_AMBULATORY_CARE_PROVIDER_SITE_OTHER): Payer: Self-pay | Admitting: Family Medicine

## 2023-05-31 VITALS — BP 126/67 | HR 75 | Temp 97.5°F | Ht 75.0 in | Wt 275.0 lb

## 2023-05-31 DIAGNOSIS — E669 Obesity, unspecified: Secondary | ICD-10-CM

## 2023-05-31 DIAGNOSIS — R7303 Prediabetes: Secondary | ICD-10-CM

## 2023-05-31 DIAGNOSIS — Z6834 Body mass index (BMI) 34.0-34.9, adult: Secondary | ICD-10-CM

## 2023-05-31 DIAGNOSIS — E559 Vitamin D deficiency, unspecified: Secondary | ICD-10-CM

## 2023-05-31 MED ORDER — VITAMIN D (ERGOCALCIFEROL) 1.25 MG (50000 UNIT) PO CAPS: 50000.0000 [IU] | ORAL_CAPSULE | ORAL | 0 refills | Status: DC

## 2023-05-31 NOTE — Progress Notes (Signed)
Jonathan Dyer, D.O.  ABFM, ABOM Specializing in Clinical Bariatric Medicine  Office located at: [address] [place], [place]  [address]   Assessment and Plan:   Obtain fasting labs and Repeat IC next OV  FOR THE DISEASE OF OBESITY: Since last office visit on 04/05/23 patient's muscle mass has decreased by 0.8 lb. Fat mass has increased by 0.4 lb. Total body water has decreased by 1.2 lb.  Counseling done on how various foods will affect these numbers and how to maximize success.  Total lbs lost to date: +1 lbs  Total weight loss percentage to date: + 0.36%    Recommended Dietary Goals Jonathan Dyer is currently in the action stage of change. As such, his goal is to continue weight management plan.  Today, we gave pt specific journaling parameters to follow: 1250-1350 calories and 100+ grams of protein daily with the Category 2 meal plan as a guide.    Behavioral Intervention We discussed the following today: decreasing simple carbohydrates  and work on tracking and journaling calories using tracking application, decreasing red meat intake to only twice weekly.   Additional resources provided today: handout on CAT 2 meal plan and handout on food journaling log  Evidence-based interventions for health behavior change were utilized today including the discussion of self monitoring techniques, problem-solving barriers and SMART goal setting techniques.   Regarding patient's less desirable eating habits and patterns, we employed the technique of small changes.   Pt will specifically work on: having red meat only twice weekly and journaling everyday.    Pharmacotherapy We both agreed to : continue with nutritional and behavioral strategies   FOR ASSOCIATED CONDITIONS ADDRESSED TODAY:  BMI 34.0-34.9,adult-current bmi  34.37 Obesity, Beginning BMI 34.25 Assessment & Plan: Pt desires to shed more lbs of fat. We had long discussion regarding what range his heart rate should be  for fat loss while exercising. I recommended 105-112. Also recommended pt incorporate more HIT interval training. Continue prudent nutritional plan.    Prediabetes Assessment & Plan: No current meds. Diet/exercise approach. His hunger and cravings are stable when following his prudent nutritional plan. Continue weight loss therapy via journaling plan. Will recheck labs next OV.    Vitamin D deficiency Assessment & Plan: He is on ERGO 50,000 units weekly, compliance and tolerance good. Continue weight loss efforts and current supplementation regiment. Will refill today. Recheck levels next OV.   Orders: -     Vitamin D (Ergocalciferol); Take 1 capsule (50,000 Units total) by mouth every 7 (seven) days.  Dispense: 13 capsule; Refill: 0   Follow up:   Return 07/02/23. He was informed of the importance of frequent follow up visits to maximize his success with intensive lifestyle modifications for his multiple health conditions.  Subjective:   Chief complaint: Obesity Jonathan Dyer is here to discuss his progress with his obesity treatment plan. He is on  the Category 3 Plan + Journaling intake and states he is following his eating plan approximately 80% of the time. He states he is spin bike/weights 60 minutes, 5 days a week.   Interval History:  Jonathan Dyer is here for a follow up office visit. Since last OV on 04/05/23, Jonathan Dyer's weight has not changed. He found the low-carb meal plan difficult to follow, thus he has been doing the Cat 3 + Journaling. Journaling wise, since 05/10/23, pt has been ranging from 1310 - 2115 calories and 79 - 185 grams of protein daily. Recently pt cooked  a large meal that consisted of 1 lb of red meat.   Pharmacotherapy for weight loss: He is currently taking no anti-obesity medication.  Review of Systems:  Pertinent positives were addressed with patient today.  Reviewed by clinician on day of visit: allergies, medications, problem list, medical history,  surgical history, family history, social history, and previous encounter notes.  Weight Summary and Biometrics   Weight Lost Since Last Visit: 0  Weight Gained Since Last Visit: 0   Vitals Temp: (!) 97.5 F (36.4 C) BP: 126/67 Pulse Rate: 75 SpO2: 100 %   Anthropometric Measurements Height: 6\' 3"  (1.905 m) Weight: 275 lb (124.7 kg) BMI (Calculated): 34.37 Weight at Last Visit: 275 lb Weight Lost Since Last Visit: 0 Weight Gained Since Last Visit: 0 Starting Weight: 274 lb Total Weight Loss (lbs): 0 lb (0 kg)   Body Composition  Body Fat %: 35.3 % Fat Mass (lbs): 97 lbs Muscle Mass (lbs): 169.2 lbs Total Body Water (lbs): 119.8 lbs Visceral Fat Rating : 21   Other Clinical Data Fasting: No Labs: Yes Today's Visit #: 18 Starting Date: 01/27/22   Objective:   PHYSICAL EXAM: Blood pressure 126/67, pulse 75, temperature (!) 97.5 F (36.4 C), height 6\' 3"  (1.905 m), weight 275 lb (124.7 kg), SpO2 100%. Body mass index is 34.37 kg/m.  General: he is overweight, cooperative and in no acute distress. PSYCH: Has normal mood, affect and thought process.   HEENT: EOMI, sclerae are anicteric. Lungs: Normal breathing effort, no conversational dyspnea. Extremities: Moves * 4 Neurologic: A and O * 3, good insight  DIAGNOSTIC DATA REVIEWED: BMET    Component Value Date/Time   NA 141 03/17/2023 1048   K 4.4 03/17/2023 1048   CL 103 03/17/2023 1048   CO2 25 03/17/2023 1048   GLUCOSE 93 03/17/2023 1048   GLUCOSE 86 04/01/2021 0813   BUN 21 03/17/2023 1048   CREATININE 1.26 03/17/2023 1048   CREATININE 1.34 (H) 10/31/2020 0946   CALCIUM 9.4 03/17/2023 1048   GFRNONAA 60 (L) 02/04/2021 0814   GFRNONAA 54 (L) 10/31/2020 0946   GFRAA 63 10/31/2020 0946   Lab Results  Component Value Date   HGBA1C 5.5 01/05/2023   HGBA1C 5.7 10/21/2018   Lab Results  Component Value Date   INSULIN 9.1 01/05/2023   INSULIN 12.6 01/27/2022   Lab Results  Component Value Date    TSH 2.560 07/21/2022   CBC    Component Value Date/Time   WBC 7.0 03/17/2023 1048   WBC 6.1 06/28/2018 0909   RBC 5.08 03/17/2023 1048   RBC 4.92 10/21/2018 0000   HGB 15.5 03/17/2023 1048   HCT 45.8 03/17/2023 1048   PLT 283 03/17/2023 1048   MCV 90 03/17/2023 1048   MCH 30.5 03/17/2023 1048   MCH 31.0 06/28/2018 0909   MCHC 33.8 03/17/2023 1048   MCHC 33.9 06/28/2018 0909   RDW 12.2 03/17/2023 1048   Iron Studies    Component Value Date/Time   IRON 86 07/21/2022 0802   TIBC 280 07/21/2022 0802   FERRITIN 228 07/21/2022 0802   IRONPCTSAT 31 07/21/2022 0802   Lipid Panel     Component Value Date/Time   CHOL 161 01/05/2023 1114   TRIG 133 01/05/2023 1114   HDL 43 01/05/2023 1114   CHOLHDL 3.7 01/05/2023 1114   CHOLHDL 6 04/01/2021 0813   VLDL 24.4 04/01/2021 0813   LDLCALC 94 01/05/2023 1114   LDLCALC 131 (H) 10/03/2019 0926   Hepatic Function  Panel     Component Value Date/Time   PROT 7.1 01/05/2023 1114   ALBUMIN 4.4 01/05/2023 1114   AST 17 01/05/2023 1114   ALT 18 01/05/2023 1114   ALKPHOS 94 01/05/2023 1114   BILITOT 0.6 01/05/2023 1114      Component Value Date/Time   TSH 2.560 07/21/2022 0802   Nutritional Lab Results  Component Value Date   VD25OH 42.9 01/05/2023   VD25OH 43.3 07/21/2022   VD25OH 39.9 01/27/2022    Attestations:   I, Special Puri, acting as a Stage manager for [employer], DO., have compiled all relevant documentation for today's office visit on behalf of Thomasene Lot, DO, while in the presence of [employer], DO.  I have reviewed the above documentation for accuracy and completeness, and I agree with the above. Jonathan Dyer, D.O.  The 21st Century Cures Act was signed into law in 2016 which includes the topic of electronic health records.  This provides immediate access to information in MyChart.  This includes consultation notes, operative notes, office notes, lab results and pathology reports.  If you  have any questions about what you read please let us know at your next visit so we can discuss your concerns and take corrective action if need be.  We are right here with you.

## 2023-06-07 DIAGNOSIS — M722 Plantar fascial fibromatosis: Secondary | ICD-10-CM | POA: Diagnosis not present

## 2023-06-07 DIAGNOSIS — G5762 Lesion of plantar nerve, left lower limb: Secondary | ICD-10-CM | POA: Diagnosis not present

## 2023-06-16 DIAGNOSIS — M722 Plantar fascial fibromatosis: Secondary | ICD-10-CM | POA: Diagnosis not present

## 2023-06-21 DIAGNOSIS — G8929 Other chronic pain: Secondary | ICD-10-CM | POA: Diagnosis not present

## 2023-06-21 DIAGNOSIS — M76899 Other specified enthesopathies of unspecified lower limb, excluding foot: Secondary | ICD-10-CM | POA: Diagnosis not present

## 2023-06-21 DIAGNOSIS — Z96652 Presence of left artificial knee joint: Secondary | ICD-10-CM | POA: Diagnosis not present

## 2023-06-21 DIAGNOSIS — M25562 Pain in left knee: Secondary | ICD-10-CM | POA: Diagnosis not present

## 2023-06-23 ENCOUNTER — Encounter (INDEPENDENT_AMBULATORY_CARE_PROVIDER_SITE_OTHER): Payer: Self-pay | Admitting: Family Medicine

## 2023-06-23 ENCOUNTER — Ambulatory Visit (INDEPENDENT_AMBULATORY_CARE_PROVIDER_SITE_OTHER): Payer: Medicare Other | Admitting: Family Medicine

## 2023-06-23 VITALS — BP 129/73 | HR 53 | Temp 94.4°F | Ht 75.0 in | Wt 271.0 lb

## 2023-06-23 DIAGNOSIS — R0602 Shortness of breath: Secondary | ICD-10-CM

## 2023-06-23 DIAGNOSIS — E669 Obesity, unspecified: Secondary | ICD-10-CM

## 2023-06-23 DIAGNOSIS — I10 Essential (primary) hypertension: Secondary | ICD-10-CM | POA: Diagnosis not present

## 2023-06-23 DIAGNOSIS — R7303 Prediabetes: Secondary | ICD-10-CM | POA: Diagnosis not present

## 2023-06-23 DIAGNOSIS — Z6833 Body mass index (BMI) 33.0-33.9, adult: Secondary | ICD-10-CM

## 2023-06-23 DIAGNOSIS — E559 Vitamin D deficiency, unspecified: Secondary | ICD-10-CM

## 2023-06-23 DIAGNOSIS — Z6834 Body mass index (BMI) 34.0-34.9, adult: Secondary | ICD-10-CM

## 2023-06-23 NOTE — Progress Notes (Signed)
 [name], D.O.  ABFM, ABOM Specializing in Clinical Bariatric Medicine  Office located at: [address] [place], [place]  [address]   Assessment and Plan:   Labs obtained today (A1C, insulin, CMP, vit D, and B12) will be reviewed at his next OV.   FOR THE DISEASE OF OBESITY: BMI 34.0-34.9,adult-current bmi  33.87 Obesity, Beginning BMI 34.25 Assessment & Plan: Since last office visit on 05/31/23 patient's muscle mass has no change. Fat mass has decreased by 4 lb. Total body water has no change. Counseling done on how various foods will affect these numbers and how to maximize success.   Total lbs lost to date: 3 lbs Total weight loss percentage to date: -1.09%    Recommended Dietary Goals Jonathan Dyer is currently in the action stage of change. As such, his goal is to continue weight management plan.  He has agreed to:  Adjusting journaling parameters to 1300-1400 calories and 120+++ g of protein, using CAT 2 meal plan as a guide.   Behavioral Intervention We discussed the following today: increasing lean protein intake to established goals, decreasing simple carbohydrates , increasing water intake , continue to practice mindfulness when eating, and continue to work on maintaining a reduced calorie state, getting the recommended amount of protein, incorporating whole foods, making healthy choices, staying well hydrated and practicing mindfulness when eating.  Additional resources provided today: None  Evidence-based interventions for health behavior change were utilized today including the discussion of self monitoring techniques, problem-solving barriers and SMART goal setting techniques.   Regarding patient's less desirable eating habits and patterns, we employed the technique of small changes.   Pt will specifically work on: Increase resistance/speed when biking, increase weight lifting to 4 days per week, and increase protein intake for next visit.    Recommended  Physical Activity Goals Jonathan Dyer has been advised to work up to 150 minutes of moderate intensity aerobic activity a week and strengthening exercises 2-3 times per week for cardiovascular health, weight loss maintenance and preservation of muscle mass.   He has agreed to :  Increase the intensity, frequency or duration of strengthening exercises  and Increase the intensity, frequency or duration of aerobic exercises  by Increasing resistance/speed when biking and increasing weight lifting to 4 days per week   Pharmacotherapy We both agreed to: continue with nutritional and behavioral strategies   FOR ASSOCIATED CONDITIONS ADDRESSED TODAY: Prediabetes Assessment & Plan: Lab Results  Component Value Date   HGBA1C 5.5 01/05/2023   HGBA1C 5.7 (H) 07/21/2022   HGBA1C 6.0 (H) 01/27/2022   INSULIN 9.1 01/05/2023   INSULIN 11.5 07/21/2022   INSULIN 12.6 01/27/2022    Last A1C improved to 5.5 from prior labs of 5.7 and 6.0. No meds currently. Diet/exercise approach. Pt has been adhering to his meal plan well and exercises regularly. Cravings are well controlled, however, he endorses some hunger.   Encouraged pt to increase his protein intake in an effort to improve control of hunger/cravings, increase muscle mass as he exercises, and continue to improve his PreDM. Prioritize low calorie and high protein foods per his meal plan. Will continue to monitor condition as it relates to weight loss. Recheck A1C, insulin, and CMP today and review results at his next OV.   Orders: - Recheck A1C - Recheck Insulin - Recheck CMP   Vitamin D deficiency Assessment & Plan: Lab Results  Component Value Date   VD25OH 42.9 01/05/2023   VD25OH 43.3 07/21/2022   VD25OH  39.9 01/27/2022   Last vitamin D was below goal. PT is on ERGO 50K units once weekly. Tolerating well with no adverse side effects. No acute concerns today.   Ideal vitamin D of 50-70 reviewed with pt. Continue with current supplementation  regimen, no changes made. Will recheck both Vitamin D and B12 today and reviewed at his next office visit.   Orders: - Recheck Vitamin D - Recheck B12   SOBOE (shortness of breath on exertion) Assessment & Plan: Pt does endorse SOBOE. Repeat IC was performed today. Results revealed a VO2 of 284, REE of 1958, and BMR of 2436. REE today was less than calculated, however, it has imporved since last checked on 01/27/22 at 1728. Pt denies any chest pain or dizziness. Continue with his current meal plan and exercise in efforts to improve his metabolism and lose weight. Will continue to monitor condition and repeat IC as pt continue to lose weight.    Essential hypertension Assessment & Plan: BP Readings from Last 3 Encounters:  06/23/23 129/73  05/31/23 126/67  04/19/23 (!) 148/71   Pt is currently on Amlodipine 10 mg once daily, Chlorthalidone 12.5 mg daily as needed, Lasix 40 mg daily as needed, and Losartan 100 mg once daily -- all managed by Dr. Diona Browner of cardiology. Tolerating all meds well with no adverse SE.   Continue with current antihypertensive regimen per cardiologist. Continue to follow up with PCP/cardiology as directed by them. Increase resistance and speed when biking. Will continue to monitor condition as it relates to his weight loss journey.    Follow up:   Return in about 4 weeks (around 07/21/2023). He was informed of the importance of frequent follow up visits to maximize his success with intensive lifestyle modifications for his multiple health conditions.  Subjective:   Chief complaint: Obesity Jonathan Dyer is here to discuss his progress with his obesity treatment plan. He is on the keeping a food journal and adhering to recommended goals of 1250-1350 calories and 100+ grams of protein with the Category 2 meal plan as a guide and states he is following his eating plan approximately 90% of the time. He states he is going to the gym (biking and weight lifting) 60 minutes 2  days per week.  Interval History:  Jonathan Dyer is here for a follow up office visit. Since last OV on 05/31/23, he is down 4 lbs. He has been journaling consistently and brought his log in today. On average he is eating 1393 calories and 113.8 g of protein from 1/21-2/5. He at times has Ritz peanut butter crackers as a snack.   Pharmacotherapy for weight loss: He is currently taking no anti-obesity medication.   Review of Systems:  Pertinent positives were addressed with patient today.  Reviewed by clinician on day of visit: allergies, medications, problem list, medical history, surgical history, family history, social history, and previous encounter notes.  Weight Summary and Biometrics   Weight Lost Since Last Visit: 4lb  Weight Gained Since Last Visit: 0    Vitals Temp: (!) 94.4 F (34.7 C) (Drank some water prior to this) BP: 129/73 Pulse Rate: (!) 53 SpO2: 96 %   Anthropometric Measurements Height: 6\' 3"  (1.905 m) Weight: 271 lb (122.9 kg) BMI (Calculated): 33.87 Weight at Last Visit: 275lb Weight Lost Since Last Visit: 4lb Weight Gained Since Last Visit: 0 Starting Weight: 274lb Total Weight Loss (lbs): 3 lb (1.361 kg)   Body Composition  Body Fat %: 34.3 % Fat Mass (  lbs): 93 lbs Muscle Mass (lbs): 169.2 lbs Total Body Water (lbs): 119.8 lbs Visceral Fat Rating : 21   Other Clinical Data RMR: 1958 Fasting: yes Labs: yes Today's Visit #: 19 Starting Date: 01/27/22    Objective:   PHYSICAL EXAM: Blood pressure 129/73, pulse (!) 53, temperature (!) 94.4 F (34.7 C), height 6\' 3"  (1.905 m), weight 271 lb (122.9 kg), SpO2 96%. Body mass index is 33.87 kg/m.  General: he is overweight, cooperative and in no acute distress. PSYCH: Has normal mood, affect and thought process.   HEENT: EOMI, sclerae are anicteric. Lungs: Normal breathing effort, no conversational dyspnea. Extremities: Moves * 4 Neurologic: A and O * 3, good insight  DIAGNOSTIC  DATA REVIEWED: BMET    Component Value Date/Time   NA 141 03/17/2023 1048   K 4.4 03/17/2023 1048   CL 103 03/17/2023 1048   CO2 25 03/17/2023 1048   GLUCOSE 93 03/17/2023 1048   GLUCOSE 86 04/01/2021 0813   BUN 21 03/17/2023 1048   CREATININE 1.26 03/17/2023 1048   CREATININE 1.34 (H) 10/31/2020 0946   CALCIUM 9.4 03/17/2023 1048   GFRNONAA 60 (L) 02/04/2021 0814   GFRNONAA 54 (L) 10/31/2020 0946   GFRAA 63 10/31/2020 0946   Lab Results  Component Value Date   HGBA1C 5.5 01/05/2023   HGBA1C 5.7 10/21/2018   Lab Results  Component Value Date   INSULIN 9.1 01/05/2023   INSULIN 12.6 01/27/2022   Lab Results  Component Value Date   TSH 2.560 07/21/2022   CBC    Component Value Date/Time   WBC 7.0 03/17/2023 1048   WBC 6.1 06/28/2018 0909   RBC 5.08 03/17/2023 1048   RBC 4.92 10/21/2018 0000   HGB 15.5 03/17/2023 1048   HCT 45.8 03/17/2023 1048   PLT 283 03/17/2023 1048   MCV 90 03/17/2023 1048   MCH 30.5 03/17/2023 1048   MCH 31.0 06/28/2018 0909   MCHC 33.8 03/17/2023 1048   MCHC 33.9 06/28/2018 0909   RDW 12.2 03/17/2023 1048   Iron Studies    Component Value Date/Time   IRON 86 07/21/2022 0802   TIBC 280 07/21/2022 0802   FERRITIN 228 07/21/2022 0802   IRONPCTSAT 31 07/21/2022 0802   Lipid Panel     Component Value Date/Time   CHOL 161 01/05/2023 1114   TRIG 133 01/05/2023 1114   HDL 43 01/05/2023 1114   CHOLHDL 3.7 01/05/2023 1114   CHOLHDL 6 04/01/2021 0813   VLDL 24.4 04/01/2021 0813   LDLCALC 94 01/05/2023 1114   LDLCALC 131 (H) 10/03/2019 0926   Hepatic Function Panel     Component Value Date/Time   PROT 7.1 01/05/2023 1114   ALBUMIN 4.4 01/05/2023 1114   AST 17 01/05/2023 1114   ALT 18 01/05/2023 1114   ALKPHOS 94 01/05/2023 1114   BILITOT 0.6 01/05/2023 1114      Component Value Date/Time   TSH 2.560 07/21/2022 0802   Nutritional Lab Results  Component Value Date   VD25OH 42.9 01/05/2023   VD25OH 43.3 07/21/2022   VD25OH  39.9 01/27/2022    Attestations:   I, Isabelle Course, acting as a Stage manager for Thomasene Lot, DO., have compiled all relevant documentation for today's office visit on behalf of Thomasene Lot, DO, while in the presence of [employer], DO.  Reviewed by clinician on day of visit: allergies, medications, problem list, medical history, surgical history, family history, social history, and previous encounter notes pertinent to patient's obesity diagnosis.  I have spent 50 minutes in the care of the patient today including: preparing to see patient (e.g. review and interpretation of tests, old notes), obtaining and/or reviewing separately obtained history, performing a medically appropriate examination or evaluation, counseling and educating the patient, ordering medications, test or procedures, documenting clinical information in the electronic or other health care record, and independently interpreting results and communicating results to the patient, family, or caregiver   I have reviewed the above documentation for accuracy and completeness, and I agree with the above. [name], D.O.  The 21st Century Cures Act was signed into law in 2016 which includes the topic of electronic health records.  This provides immediate access to information in MyChart.  This includes consultation notes, operative notes, office notes, lab results and pathology reports.  If you have any questions about what you read please let us know at your next visit so we can discuss your concerns and take corrective action if need be.  We are right here with you.

## 2023-06-24 DIAGNOSIS — M25562 Pain in left knee: Secondary | ICD-10-CM | POA: Diagnosis not present

## 2023-06-24 DIAGNOSIS — G8929 Other chronic pain: Secondary | ICD-10-CM | POA: Diagnosis not present

## 2023-06-24 LAB — COMPREHENSIVE METABOLIC PANEL
ALT: 24 [IU]/L (ref 0–44)
AST: 19 [IU]/L (ref 0–40)
Albumin: 4.4 g/dL (ref 3.9–4.9)
Alkaline Phosphatase: 102 [IU]/L (ref 44–121)
BUN/Creatinine Ratio: 13 (ref 10–24)
BUN: 19 mg/dL (ref 8–27)
Bilirubin Total: 0.5 mg/dL (ref 0.0–1.2)
CO2: 24 mmol/L (ref 20–29)
Calcium: 9.5 mg/dL (ref 8.6–10.2)
Chloride: 103 mmol/L (ref 96–106)
Creatinine, Ser: 1.44 mg/dL — ABNORMAL HIGH (ref 0.76–1.27)
Globulin, Total: 2.7 g/dL (ref 1.5–4.5)
Glucose: 87 mg/dL (ref 70–99)
Potassium: 4.4 mmol/L (ref 3.5–5.2)
Sodium: 143 mmol/L (ref 134–144)
Total Protein: 7.1 g/dL (ref 6.0–8.5)
eGFR: 53 mL/min/{1.73_m2} — ABNORMAL LOW (ref >59)

## 2023-06-24 LAB — INSULIN, RANDOM: INSULIN: 12.2 u[IU]/mL (ref 2.6–24.9)

## 2023-06-24 LAB — VITAMIN D 25 HYDROXY (VIT D DEFICIENCY, FRACTURES): Vit D, 25-Hydroxy: 48.8 ng/mL (ref 30.0–100.0)

## 2023-06-24 LAB — HEMOGLOBIN A1C
Est. average glucose Bld gHb Est-mCnc: 114 mg/dL
Hgb A1c MFr Bld: 5.6 % (ref 4.8–5.6)

## 2023-06-24 LAB — VITAMIN B12: Vitamin B-12: 648 pg/mL (ref 232–1245)

## 2023-06-25 DIAGNOSIS — Z96652 Presence of left artificial knee joint: Secondary | ICD-10-CM | POA: Diagnosis not present

## 2023-06-25 DIAGNOSIS — M25562 Pain in left knee: Secondary | ICD-10-CM | POA: Diagnosis not present

## 2023-06-25 DIAGNOSIS — G8929 Other chronic pain: Secondary | ICD-10-CM | POA: Diagnosis not present

## 2023-06-25 DIAGNOSIS — M76899 Other specified enthesopathies of unspecified lower limb, excluding foot: Secondary | ICD-10-CM | POA: Diagnosis not present

## 2023-06-25 DIAGNOSIS — M25462 Effusion, left knee: Secondary | ICD-10-CM | POA: Diagnosis not present

## 2023-07-01 DIAGNOSIS — M25562 Pain in left knee: Secondary | ICD-10-CM | POA: Diagnosis not present

## 2023-07-01 DIAGNOSIS — M76899 Other specified enthesopathies of unspecified lower limb, excluding foot: Secondary | ICD-10-CM | POA: Diagnosis not present

## 2023-07-01 DIAGNOSIS — G8929 Other chronic pain: Secondary | ICD-10-CM | POA: Diagnosis not present

## 2023-07-01 DIAGNOSIS — Z96652 Presence of left artificial knee joint: Secondary | ICD-10-CM | POA: Diagnosis not present

## 2023-07-05 ENCOUNTER — Ambulatory Visit: Payer: Medicare Other | Admitting: Internal Medicine

## 2023-07-05 DIAGNOSIS — T8484XA Pain due to internal orthopedic prosthetic devices, implants and grafts, initial encounter: Secondary | ICD-10-CM | POA: Diagnosis not present

## 2023-07-08 ENCOUNTER — Telehealth: Payer: Self-pay | Admitting: Cardiology

## 2023-07-08 DIAGNOSIS — M25562 Pain in left knee: Secondary | ICD-10-CM | POA: Diagnosis not present

## 2023-07-08 NOTE — Telephone Encounter (Signed)
   Pre-operative Risk Assessment    Patient Name: Jonathan Dyer  DOB: Oct 04, 1955 MRN: [id number]   Date of last office visit: 03/17/2023 Date of next office visit: 07/26/2023   Request for Surgical Clearance    Procedure:   revision right total knee arthroplasty  Date of Surgery:  Clearance TBD                                Surgeon:  Dr. Weber Cooks Surgeon's Group or Practice Name:  [facility] Phone number:  [phone]  [phone] [facility] Fax number:  [phone]   Type of Clearance Requested:   - Medical  - Pharmacy:  Hold Aspirin and Clopidogrel (Plavix) need instruction   Type of Anesthesia:  Spinal   Additional requests/questions:    Sharen Hones   07/08/2023, 12:13 PM

## 2023-07-08 NOTE — Telephone Encounter (Signed)
   Name: Jonathan Dyer  DOB: 06-03-1955  MRN: [id number]  Primary Cardiologist: Nona Dell, MD  Chart reviewed as part of pre-operative protocol coverage. The patient has an upcoming visit scheduled with [facility] on 07/26/2023 at which time clearance can be addressed in case there are any issues that would impact surgical recommendations.  Revision right total knee arthroplasty is not scheduled until TBD as below. I added preop FYI to appointment note so that provider is aware to address at time of outpatient visit.  Per office protocol the cardiology provider should forward their finalized clearance decision and recommendations regarding antiplatelet therapy to the requesting party below.    I will route this message as FYI to requesting party and remove this message from the preop box as separate preop APP input not needed at this time.   Please call with any questions.  Joylene Grapes, NP  07/08/2023, 12:23 PM

## 2023-07-09 ENCOUNTER — Telehealth: Payer: Self-pay | Admitting: Internal Medicine

## 2023-07-09 NOTE — Telephone Encounter (Signed)
 Surgery clearance  Noted  Copied Sleeved  Original in PCP box Copy front desk folder

## 2023-07-12 ENCOUNTER — Telehealth: Payer: Self-pay | Admitting: *Deleted

## 2023-07-12 NOTE — Telephone Encounter (Signed)
 S/w the pt and he has been informed Jonathan Dyer, [facility] sent a MY CHART message to him today: Jonathan Dyer,  I hope you are doing well. Your appointment with Jonathan Chard NP has been cancelled on 08/02/23 as she has left the practice. Your Plavix will be transitioned to aspirin 81mg  daily on 08/04/23 (6 month out from your Watchman). Someone from our office will call you that day to confirm this and update your med list. You no longer need antibiotics for dental work after this date as well. I understand that you need upcoming orthopedic surgery and will require a pre operative clearance. That team will reach out to you to get that taken care of.   Sorry about all the moving of apts !  Jonathan Dyer     D/w pt further about when to stop Plavix. Pt was informed ok per Jonathan Dyer, PAC to stop Plavix 08/04/23 for the prostate Bx that is in the chart as well; per Jonathan Dyer, PAC to start ASA 81 mg daily after the prostate Bx. I advised pt to be sure to let Dr. Marlou Dyer know of the upcoming medication changes with Plavix and ASA 81 mg. I stated that Dr. Marlou Dyer can let him know when it would be safe to start ASA 81 mg daily. Pt agreed to the plan. I reviewed medications with the pt. I did not remove the Plavix from his med list as he is actively taking Plavix, however I did make a note about stopping and changing to ASA, see notes. Pt says he will probably be stopping Plavix more like 07/27/23 as that is about when he will run out and he is not going to refill. I did tell the pt that is it important to not wait too long after the Bx as the ASA will not act as an antiplatelet to help keeps RBC from forming a clot. Pt verbalized understanding.   Pt states his knee surgery is not going to be until June sometime maybe around 10/28/23. Tele preop appt has been scheduled May 12th incase knee surgery is scheduled sooner.   I will upate all parties involved.

## 2023-07-12 NOTE — Telephone Encounter (Signed)
(  Newest Message First)July 12, 2023 Me     07/12/23  4:29 PM Note S/w the pt and he has been informed Cline Crock, [facility] sent a MY CHART message to him today: Mr Nichelson,  I hope you are doing well. Your appointment with [name] NP has been cancelled on 08/02/23 as she has left the practice. Your Plavix will be transitioned to aspirin 81mg  daily on 08/04/23 (6 month out from your Watchman). Someone from our office will call you that day to confirm this and update your med list. You no longer need antibiotics for dental work after this date as well. I understand that you need upcoming orthopedic surgery and will require a pre operative clearance. That team will reach out to you to get that taken care of.   Sorry about all the moving of apts !  [name]        D/w pt further about when to stop Plavix. Pt was informed ok per Cline Crock, PAC to stop Plavix 08/04/23 for the prostate Bx that is in the chart as well; per Cline Crock, PAC to start ASA 81 mg daily after the prostate Bx. I advised pt to be sure to let Dr. Marlou Porch know of the upcoming medication changes with Plavix and ASA 81 mg. I stated that Dr. Marlou Porch can let him know when it would be safe to start ASA 81 mg daily. Pt agreed to the plan. I reviewed medications with the pt. I did not remove the Plavix from his med list as he is actively taking Plavix, however I did make a note about stopping and changing to ASA, see notes. Pt says he will probably be stopping Plavix more like 07/27/23 as that is about when he will run out and he is not going to refill. I did tell the pt that is it important to not wait too long after the Bx as the ASA will not act as an antiplatelet to help keeps RBC from forming a clot. Pt verbalized understanding.    Pt states his knee surgery is not going to be until June sometime maybe around 10/28/23. Tele preop appt has been scheduled May 12th incase knee surgery is scheduled sooner.    I will  upate all parties involved.

## 2023-07-12 NOTE — Telephone Encounter (Signed)
  Patient Consent for Virtual Visit        Jonathan Dyer has provided verbal consent on 07/12/2023 for a virtual visit (video or telephone).   CONSENT FOR VIRTUAL VISIT FOR:  Jonathan Dyer  By participating in this virtual visit I agree to the following:  I hereby voluntarily request, consent and authorize [facility] and its employed or contracted physicians, physician assistants, nurse practitioners or other licensed health care professionals (the Practitioner), to provide me with telemedicine health care services (the "Services") as deemed necessary by the treating Practitioner. I acknowledge and consent to receive the Services by the Practitioner via telemedicine. I understand that the telemedicine visit will involve communicating with the Practitioner through live audiovisual communication technology and the disclosure of certain medical information by electronic transmission. I acknowledge that I have been given the opportunity to request an in-person assessment or other available alternative prior to the telemedicine visit and am voluntarily participating in the telemedicine visit.  I understand that I have the right to withhold or withdraw my consent to the use of telemedicine in the course of my care at any time, without affecting my right to future care or treatment, and that the Practitioner or I may terminate the telemedicine visit at any time. I understand that I have the right to inspect all information obtained and/or recorded in the course of the telemedicine visit and may receive copies of available information for a reasonable fee.  I understand that some of the potential risks of receiving the Services via telemedicine include:  Delay or interruption in medical evaluation due to technological equipment failure or disruption; Information transmitted may not be sufficient (e.g. poor resolution of images) to allow for appropriate medical decision making by the  Practitioner; and/or  In rare instances, security protocols could fail, causing a breach of personal health information.  Furthermore, I acknowledge that it is my responsibility to provide information about my medical history, conditions and care that is complete and accurate to the best of my ability. I acknowledge that Practitioner's advice, recommendations, and/or decision may be based on factors not within their control, such as incomplete or inaccurate data provided by me or distortions of diagnostic images or specimens that may result from electronic transmissions. I understand that the practice of medicine is not an exact science and that Practitioner makes no warranties or guarantees regarding treatment outcomes. I acknowledge that a copy of this consent can be made available to me via my patient portal [facility] MyChart), or I can request a printed copy by calling the office of  [facility].    I understand that my insurance will be billed for this visit.   I have read or had this consent read to me. I understand the contents of this consent, which adequately explains the benefits and risks of the Services being provided via telemedicine.  I have been provided ample opportunity to ask questions regarding this consent and the Services and have had my questions answered to my satisfaction. I give my informed consent for the services to be provided through the use of telemedicine in my medical care

## 2023-07-13 ENCOUNTER — Ambulatory Visit: Payer: Medicare Other | Admitting: Internal Medicine

## 2023-07-19 ENCOUNTER — Encounter: Payer: Self-pay | Admitting: Internal Medicine

## 2023-07-20 DIAGNOSIS — M76899 Other specified enthesopathies of unspecified lower limb, excluding foot: Secondary | ICD-10-CM | POA: Diagnosis not present

## 2023-07-20 DIAGNOSIS — G8929 Other chronic pain: Secondary | ICD-10-CM | POA: Diagnosis not present

## 2023-07-20 DIAGNOSIS — M25562 Pain in left knee: Secondary | ICD-10-CM | POA: Diagnosis not present

## 2023-07-20 DIAGNOSIS — Z96652 Presence of left artificial knee joint: Secondary | ICD-10-CM | POA: Diagnosis not present

## 2023-07-22 DIAGNOSIS — M25562 Pain in left knee: Secondary | ICD-10-CM | POA: Diagnosis not present

## 2023-07-22 DIAGNOSIS — Z96652 Presence of left artificial knee joint: Secondary | ICD-10-CM | POA: Diagnosis not present

## 2023-07-22 DIAGNOSIS — G8929 Other chronic pain: Secondary | ICD-10-CM | POA: Diagnosis not present

## 2023-07-22 DIAGNOSIS — M76899 Other specified enthesopathies of unspecified lower limb, excluding foot: Secondary | ICD-10-CM | POA: Diagnosis not present

## 2023-07-23 ENCOUNTER — Telehealth: Payer: Self-pay | Admitting: *Deleted

## 2023-07-23 ENCOUNTER — Telehealth: Payer: Self-pay

## 2023-07-23 NOTE — Telephone Encounter (Signed)
   Pre-operative Risk Assessment    Patient Name: Jonathan Dyer  DOB: 1955-05-27 MRN: [id number]   Date of last office visit: 03/17/23 [facility], NP Date of next office visit: 09/20/23 [facility] VISIT   Request for Surgical Clearance    Procedure:   PROSTATE BIOPSY  Date of Surgery:  Clearance 08/09/23                                Surgeon:  DR Mena Goes Surgeon's Group or Practice Name:  [facility] Phone number:  [phone] Fax number:  [phone]   Type of Clearance Requested:   - Medical  - Pharmacy:  Hold Aspirin and Clopidogrel (Plavix)     Type of Anesthesia:  Not Indicated   Additional requests/questions:    SignedMarlow Baars   07/23/2023, 3:02 PM

## 2023-07-23 NOTE — Telephone Encounter (Signed)
   Name: Jonathan Dyer  DOB: 1955-12-05  MRN: [id number]  Primary Cardiologist: Nona Dell, MD   Preoperative team, please contact this patient and set up a phone call appointment for further preoperative risk assessment. Please obtain consent and complete medication review. Thank you for your help.  I confirm that guidance regarding antiplatelet and oral anticoagulation therapy has been completed and, if necessary, noted below.  Patient may hold Plavix for 5 days prior to procedure beginning 08/04/2023.  He has not started taking aspirin, he will begin taking aspirin postbiopsy at which time he will discontinue Plavix.   I also confirmed the patient resides in the state of [place]. As per [facility] Medical Board telemedicine laws, the patient must reside in the state in which the provider is licensed.   Joylene Grapes, NP 07/23/2023, 3:15 PM [facility]

## 2023-07-23 NOTE — Telephone Encounter (Signed)
Completed and faxed with confirmation.

## 2023-07-23 NOTE — Telephone Encounter (Signed)
 Pt has been scheduled tele preop appt 08/03/23. Pt has 2 separate surgeries to be cleared for.   Request : 07/23/23 Prostate Bx  Request: 07/08/23  revision right total knee arthroplasty   Med rec and consent are done.

## 2023-07-23 NOTE — Telephone Encounter (Signed)
 Pt has been scheduled tele preop appt 08/03/23. Pt has 2 separate surgeries to be cleared for.   Request : 07/23/23 Prostate Bx  Request: 07/08/23  revision right total knee arthroplasty   Med rec and consent are done.     Patient Consent for Virtual Visit        Jonathan Dyer has provided verbal consent on 07/23/2023 for a virtual visit (video or telephone).   CONSENT FOR VIRTUAL VISIT FOR:  Jonathan Dyer  By participating in this virtual visit I agree to the following:  I hereby voluntarily request, consent and authorize [facility] and its employed or contracted physicians, physician assistants, nurse practitioners or other licensed health care professionals (the Practitioner), to provide me with telemedicine health care services (the "Services") as deemed necessary by the treating Practitioner. I acknowledge and consent to receive the Services by the Practitioner via telemedicine. I understand that the telemedicine visit will involve communicating with the Practitioner through live audiovisual communication technology and the disclosure of certain medical information by electronic transmission. I acknowledge that I have been given the opportunity to request an in-person assessment or other available alternative prior to the telemedicine visit and am voluntarily participating in the telemedicine visit.  I understand that I have the right to withhold or withdraw my consent to the use of telemedicine in the course of my care at any time, without affecting my right to future care or treatment, and that the Practitioner or I may terminate the telemedicine visit at any time. I understand that I have the right to inspect all information obtained and/or recorded in the course of the telemedicine visit and may receive copies of available information for a reasonable fee.  I understand that some of the potential risks of receiving the Services via telemedicine include:  Delay or  interruption in medical evaluation due to technological equipment failure or disruption; Information transmitted may not be sufficient (e.g. poor resolution of images) to allow for appropriate medical decision making by the Practitioner; and/or  In rare instances, security protocols could fail, causing a breach of personal health information.  Furthermore, I acknowledge that it is my responsibility to provide information about my medical history, conditions and care that is complete and accurate to the best of my ability. I acknowledge that Practitioner's advice, recommendations, and/or decision may be based on factors not within their control, such as incomplete or inaccurate data provided by me or distortions of diagnostic images or specimens that may result from electronic transmissions. I understand that the practice of medicine is not an exact science and that Practitioner makes no warranties or guarantees regarding treatment outcomes. I acknowledge that a copy of this consent can be made available to me via my patient portal [facility] MyChart), or I can request a printed copy by calling the office of [facility].    I understand that my insurance will be billed for this visit.   I have read or had this consent read to me. I understand the contents of this consent, which adequately explains the benefits and risks of the Services being provided via telemedicine.  I have been provided ample opportunity to ask questions regarding this consent and the Services and have had my questions answered to my satisfaction. I give my informed consent for the services to be provided through the use of telemedicine in my medical care

## 2023-07-26 ENCOUNTER — Ambulatory Visit: Payer: Medicare Other

## 2023-07-27 ENCOUNTER — Encounter (INDEPENDENT_AMBULATORY_CARE_PROVIDER_SITE_OTHER): Payer: Self-pay | Admitting: Family Medicine

## 2023-07-27 ENCOUNTER — Encounter: Payer: Self-pay | Admitting: Internal Medicine

## 2023-07-27 ENCOUNTER — Ambulatory Visit (INDEPENDENT_AMBULATORY_CARE_PROVIDER_SITE_OTHER): Admitting: Internal Medicine

## 2023-07-27 ENCOUNTER — Ambulatory Visit (INDEPENDENT_AMBULATORY_CARE_PROVIDER_SITE_OTHER): Payer: Medicare Other | Admitting: Family Medicine

## 2023-07-27 ENCOUNTER — Telehealth: Payer: Self-pay | Admitting: Physician Assistant

## 2023-07-27 VITALS — BP 122/72 | HR 51 | Temp 97.9°F | Ht 75.0 in | Wt 265.0 lb

## 2023-07-27 VITALS — BP 122/71 | HR 53 | Ht 75.0 in | Wt 273.2 lb

## 2023-07-27 DIAGNOSIS — K219 Gastro-esophageal reflux disease without esophagitis: Secondary | ICD-10-CM

## 2023-07-27 DIAGNOSIS — I480 Paroxysmal atrial fibrillation: Secondary | ICD-10-CM | POA: Diagnosis not present

## 2023-07-27 DIAGNOSIS — C61 Malignant neoplasm of prostate: Secondary | ICD-10-CM

## 2023-07-27 DIAGNOSIS — E669 Obesity, unspecified: Secondary | ICD-10-CM | POA: Diagnosis not present

## 2023-07-27 DIAGNOSIS — N1831 Chronic kidney disease, stage 3a: Secondary | ICD-10-CM | POA: Diagnosis not present

## 2023-07-27 DIAGNOSIS — Z6834 Body mass index (BMI) 34.0-34.9, adult: Secondary | ICD-10-CM

## 2023-07-27 DIAGNOSIS — E782 Mixed hyperlipidemia: Secondary | ICD-10-CM

## 2023-07-27 DIAGNOSIS — N400 Enlarged prostate without lower urinary tract symptoms: Secondary | ICD-10-CM | POA: Diagnosis not present

## 2023-07-27 DIAGNOSIS — Z6833 Body mass index (BMI) 33.0-33.9, adult: Secondary | ICD-10-CM

## 2023-07-27 DIAGNOSIS — R7303 Prediabetes: Secondary | ICD-10-CM

## 2023-07-27 DIAGNOSIS — I10 Essential (primary) hypertension: Secondary | ICD-10-CM

## 2023-07-27 DIAGNOSIS — E559 Vitamin D deficiency, unspecified: Secondary | ICD-10-CM | POA: Diagnosis not present

## 2023-07-27 NOTE — Patient Instructions (Signed)
 It was a pleasure to see you today.  Thank you for giving [place] the opportunity to be involved in your care.  Below is a brief recap of your visit and next steps.  We will plan to see you again in 6 months.  Summary No medication changes today Best of luck with upcoming surgery We will follow up in 6 months Consider asking your cardiologist about [facility]

## 2023-07-27 NOTE — Telephone Encounter (Addendum)
 Called to check in with patient, who had LAAO on 02/04/23. He has discontinued Plavix TODAY on 07/27/23 and started a baby aspirin given elevated CAC score on pre op imaging. He will continue this long term and hold for upcoming biopsies and knee surgery. The patient reports doing well with no issues.  The patient understands to call with questions or concerns.

## 2023-07-27 NOTE — Progress Notes (Signed)
 Established Patient Office Visit  Subjective   [name], male    DOB: 04/02/1956  Age: 67 y.o. MRN: [id number]  Chief Complaint  Patient presents with   Care Management    Pt. States he needs to have a release from PCP and Cardiologist to be cleared for upcoming surgery, Prostate biopsy in two weeks and knee revision when he gets the releases.   Jonathan Dyer returns to care today for routine follow-up.  He was last evaluated by me in August 2024 as a new patient presenting to establish care.  No medication changes were made at that time and 100-month follow-up was arranged.  In the interim, he underwent watchman implantation in September.  His postoperative course has been uncomplicated.  He has been seen by cardiology and healthy weight and wellness for follow-up on multiple occasions.  There have otherwise been no acute interval events.  Today he reports feeling fairly well.  He will undergo revision of left knee arthroplasty in upcoming weeks and will also have a prostate biopsy completed in 2 weeks.  Past Medical History:  Diagnosis Date   Allergy    Arthritis    Atrial fibrillation (HCC)    Diagnosed 09/2009, on flecainide    Back pain    Bilateral swelling of feet    Bradycardia    Event monitor 2010: HR down to 45, asymptomatic.   Cataract    Coronary atherosclerosis of native coronary artery    Nonobstructive and distal disease by cath 2005.    Essential hypertension    Hyperlipidemia    Joint pain    OSA (obstructive sleep apnea)    Osteoarthritis    Overweight    Prediabetes    Presence of Watchman left atrial appendage closure device 02/04/2023   Watchman 27mm FLX Pro placed by Dr. Marven Slimmer   Prostate cancer [facility]) 05/2022   following via biopsy   Renal cyst    Ringing in ears    Sleep apnea    Vitamin D  deficiency    Past Surgical History:  Procedure Laterality Date   BIOPSY  01/04/2018   Procedure: BIOPSY;  Surgeon: Alyce Jubilee, MD;   Location: [facility];  Service: Endoscopy;;  ascending colon    CARDIAC CATHETERIZATION  2005   COLONOSCOPY  05/2006   SLF: 3 mm sigmoid: Tubular adenoma removed.   COLONOSCOPY N/A 05/06/2016   Dr. Nolene Baumgarten: two sessile polyps in mid transverse colon and hepatic flexure (tubular adenomas). ext/int hemorrhoids. tortuous left colon.    COLONOSCOPY WITH PROPOFOL  N/A 01/04/2018   Procedure: COLONOSCOPY WITH PROPOFOL ;  Surgeon: Alyce Jubilee, MD;  Location: [facility];  Service: Endoscopy;  Laterality: N/A;  2:30pm   KNEE ARTHROSCOPY     Left anterior cruciate ligament reconstruction     LEFT ATRIAL APPENDAGE OCCLUSION N/A 02/04/2023   Procedure: LEFT ATRIAL APPENDAGE OCCLUSION;  Surgeon: Boyce Byes, MD;  Location: [facility] INVASIVE CV LAB;  Service: Cardiovascular;  Laterality: N/A;   left shoulder surgery     dr . Porfirio Bristol collins    POLYPECTOMY  05/06/2016   Procedure: POLYPECTOMY;  Surgeon: Alyce Jubilee, MD;  Location: [facility];  Service: Endoscopy;;  transverse colon polyp, hepatic flexure polyp,    Right shoulder surgery     ROBOT ASSISTED LAPAROSCOPIC NEPHRECTOMY Left 07/04/2018   Procedure: XI ROBOTIC ASSISTED LAPAROSCOPIC RENAL CYST DECORTICATION;  Surgeon: Marco Severs, MD;  Location: [facility] ORS;  Service: Urology;  Laterality: Left;  3 HRSPROCEDURE: ROBOTIC RENAL CYST DECORTICATION   TEE WITHOUT CARDIOVERSION N/A 02/04/2023   Procedure: TRANSESOPHAGEAL ECHOCARDIOGRAM;  Surgeon: Boyce Byes, MD;  Location: [facility] INVASIVE CV LAB;  Service: Cardiovascular;  Laterality: N/A;   TONSILLECTOMY     TOTAL KNEE ARTHROPLASTY     Social History   Tobacco Use   Smoking status: Never   Smokeless tobacco: Never  Vaping Use   Vaping status: Never Used  Substance Use Topics   Alcohol use: No    Alcohol/week: 0.0 standard drinks of alcohol   Drug use: No   Family History  Problem Relation Age of Onset   Diabetes Mother    Pulmonary embolism Mother    Obesity Mother     Cancer Father    Hypertension Father    Lung disease Father    Heart disease Father    Arthritis Sister    Hyperlipidemia Brother    Hypertension Brother    Diabetes Other        Significant family h/o DM   Colon cancer Neg Hx    Allergies  Allergen Reactions   Clindamycin/Lincomycin Swelling and Other (See Comments)    Mycin drug family    Penicillin G Other (See Comments)   Penicillins Other (See Comments)    Has patient had a PCN reaction causing immediate rash, facial/tongue/throat swelling, SOB or lightheadedness with hypotension: unknown Has patient had a PCN reaction causing severe rash involving mucus membranes or skin necrosis: unknown Has patient had a PCN reaction that required hospitalization unknown Has patient had a PCN reaction occurring within the last 10 years: unknown If all of the above answers are "NO", then may proceed    Pneumococcal 13-Val Conj Vacc Swelling    Patient stated that he has severe swelling in his arm from vaccine and swollen up to his collar bone. Lasted about 2 days and did not feel well.    Review of Systems  Constitutional:  Negative for chills and fever.  HENT:  Negative for sore throat.   Respiratory:  Negative for cough and shortness of breath.   Cardiovascular:  Negative for chest pain, palpitations and leg swelling.  Gastrointestinal:  Negative for abdominal pain, blood in stool, constipation, diarrhea, nausea and vomiting.  Genitourinary:  Negative for dysuria and hematuria.  Musculoskeletal:  Negative for myalgias.  Skin:  Negative for itching and rash.  Neurological:  Negative for dizziness and headaches.  Psychiatric/Behavioral:  Negative for depression and suicidal ideas.      Objective:     BP 122/71 (BP Location: Left Arm, Patient Position: Sitting, Cuff Size: Large)   Pulse (!) 53   Ht 6\' 3"  (1.905 m)   Wt 273 lb 3.2 oz (123.9 kg)   SpO2 95%   BMI 34.15 kg/m  BP Readings from Last 3 Encounters:  08/31/23 126/71   08/27/23 130/76  07/27/23 122/71   Physical Exam Vitals reviewed.  Constitutional:      General: He is not in acute distress.    Appearance: Normal appearance. He is obese. He is not ill-appearing.  HENT:     Head: Normocephalic and atraumatic.     Right Ear: External ear normal.     Left Ear: External ear normal.     Nose: Nose normal. No congestion or rhinorrhea.     Mouth/Throat:     Mouth: Mucous membranes are moist.     Pharynx: Oropharynx is clear.  Eyes:     General: No scleral icterus.  Extraocular Movements: Extraocular movements intact.     Conjunctiva/sclera: Conjunctivae normal.     Pupils: Pupils are equal, round, and reactive to light.  Cardiovascular:     Rate and Rhythm: Normal rate and regular rhythm.     Pulses: Normal pulses.     Heart sounds: Normal heart sounds. No murmur heard. Pulmonary:     Effort: Pulmonary effort is normal.     Breath sounds: Normal breath sounds. No wheezing, rhonchi or rales.  Abdominal:     General: Abdomen is flat. Bowel sounds are normal. There is no distension.     Palpations: Abdomen is soft.     Tenderness: There is no abdominal tenderness.  Musculoskeletal:        General: No swelling or deformity. Normal range of motion.     Cervical back: Normal range of motion.  Skin:    General: Skin is warm and dry.     Capillary Refill: Capillary refill takes less than 2 seconds.  Neurological:     General: No focal deficit present.     Mental Status: He is alert and oriented to person, place, and time.     Motor: No weakness.  Psychiatric:        Mood and Affect: Mood normal.        Behavior: Behavior normal.        Thought Content: Thought content normal.   Last CBC Lab Results  Component Value Date   WBC 6.2 09/02/2023   HGB 15.2 09/02/2023   HCT 45.5 09/02/2023   MCV 88.2 09/02/2023   MCH 29.5 09/02/2023   RDW 11.9 09/02/2023   PLT 246 09/02/2023   Last metabolic panel Lab Results  Component Value Date    GLUCOSE 97 09/02/2023   NA 138 09/02/2023   K 4.3 09/02/2023   CL 105 09/02/2023   CO2 23 09/02/2023   BUN 25 (H) 09/02/2023   CREATININE 1.30 (H) 09/02/2023   EGFR 53 (L) 06/23/2023   CALCIUM  9.1 09/02/2023   PROT 7.1 06/23/2023   ALBUMIN 4.4 06/23/2023   LABGLOB 2.7 06/23/2023   AGRATIO 1.7 07/21/2022   BILITOT 0.5 06/23/2023   ALKPHOS 102 06/23/2023   AST 19 06/23/2023   ALT 24 06/23/2023   ANIONGAP 10 09/02/2023   Last lipids Lab Results  Component Value Date   CHOL 161 01/05/2023   HDL 43 01/05/2023   LDLCALC 94 01/05/2023   TRIG 133 01/05/2023   CHOLHDL 3.7 01/05/2023   Last hemoglobin A1c Lab Results  Component Value Date   HGBA1C 5.6 06/23/2023   Last thyroid  functions Lab Results  Component Value Date   TSH 1.450 09/02/2023   T3TOTAL 117 01/27/2022   T4TOTAL 7.1 09/02/2023   Last vitamin D  Lab Results  Component Value Date   VD25OH 48.8 06/23/2023   Last vitamin B12 and Folate Lab Results  Component Value Date   VITAMINB12 648 06/23/2023   FOLATE 6.7 07/21/2022   The 10-year ASCVD risk score (Arnett DK, et al., 2019) is: 19.6%    Assessment & Plan:   Problem List Items Addressed This Visit       PAROXYSMAL ATRIAL FIBRILLATION   S/p watchman implantation in September 2024.  Is post procedural course has been uncomplicated.  He will follow-up with cardiology next week.      Essential hypertension - Primary   Remains adequately controlled on current antihypertensive regimen.  No medication changes are indicated today.      Gastroesophageal reflux disease  without esophagitis   Symptoms remain well-controlled with as needed use of omeprazole .      Prostate cancer [facility])   Currently under annual surveillance.  Followed by Dr. Dulcy Gibney. MRI of the prostate completed in December showed PI-RADS category 3 lesion of the left posterolateral and posterromedial peripheral zone of the base.  Jonathan Dyer reports that he will undergo repeat biopsy.       BPH (benign prostatic hyperplasia)   Symptoms remain well-controlled with Flomax .      CKD stage 3a, GFR 45-59 ml/min (HCC)   Stable renal function noted on labs from 2/12.  Remains on ARB.      Hyperlipidemia, mixed   Lipid panel updated in August 2024.  Total cholesterol 161 and LDL 94.  He remains on Repatha .  No changes indicated today.      Return in about 6 months (around 01/27/2024).   Tobi Fortes, MD

## 2023-07-27 NOTE — Progress Notes (Signed)
 Jonathan Dyer, D.O.  ABFM, ABOM Specializing in Clinical Bariatric Medicine  Office located at: [address] [place], [place]  [address]   Assessment and Plan:   FOR THE DISEASE OF OBESITY:  BMI 34.0-34.9,adult-current bmi 33.12 Obesity, Beginning BMI 34.25 Assessment & Plan: Since last office visit on 06/23/2023 patient's  Muscle mass has not changed. Fat mass has decreased by 5.6 lb. Total body water has decreased by 3.2 lb.  Counseling done on how various foods will affect these numbers and how to maximize success  Total lbs lost to date: 9 lbs  Total weight loss percentage to date: 3.28%    Recommended Dietary Goals Jonathan Dyer is currently in the action stage of change. As such, his goal is to continue weight management plan.  He has agreed to: continue current plan   Behavioral Intervention We discussed the following today: decreasing sodium intake, increasing lean protein intake to established goals, increasing water intake , celebration eating strategies, and focusing on food with a 10:1 ratio of calories: grams of protein  Additional resources provided today:  educational handout on Metformin  Evidence-based interventions for health behavior change were utilized today including the discussion of self monitoring techniques, problem-solving barriers and SMART goal setting techniques.   Regarding patient's less desirable eating habits and patterns, we employed the technique of small changes.   Pt will specifically work on: n/a   Recommended Physical Activity Goals Jonathan Dyer has been advised to work up to 150 minutes of moderate intensity aerobic activity a week and strengthening exercises 2-3 times per week for cardiovascular health, weight loss maintenance and preservation of muscle mass.   He has agreed to : continue to gradually increase the amount and intensity of exercise routine   Pharmacotherapy We both agreed to : continue with nutritional and behavioral  strategies. Pt not interested in injectable wt loss meds.    FOR ASSOCIATED CONDITIONS ADDRESSED TODAY:  Prediabetes Assessment & Plan: Lab Results  Component Value Date   HGBA1C 5.6 06/23/2023   HGBA1C 5.5 01/05/2023   HGBA1C 5.7 (H) 07/21/2022   INSULIN 12.2 06/23/2023   INSULIN 9.1 01/05/2023   INSULIN 11.5 07/21/2022   Lab Results  Component Value Date   CREATININE 1.44 (H) 06/23/2023   BUN 19 06/23/2023   NA 143 06/23/2023   K 4.4 06/23/2023   CL 103 06/23/2023   CO2 24 06/23/2023      Component Value Date/Time   PROT 7.1 06/23/2023 1055   ALBUMIN 4.4 06/23/2023 1055   AST 19 06/23/2023 1055   ALT 24 06/23/2023 1055   ALKPHOS 102 06/23/2023 1055   BILITOT 0.5 06/23/2023 1055    Lab Results  Component Value Date   VITAMINB12 648 06/23/2023   No current medications- Diet/lifestyle approach. He does report sometimes having hunger/cravings for carbs. His Hemoglobin A1c is stable at 5.6. Fasting insulin is almost two times normal - eventual goal <5. Kidney function has increased; pt denies NSAID use prior to labs but reports being under in water intake. Liver enzymes and B12 levels are WNL.  Discussed that adequate hydration, exercise, controlling blood sugars/blood pressure, and continuing to avoid NSAIDs can improve kidney function. Additionally, on 02/2022, pt was prescribed Metformin by my colleague Shawn Rayburn PA. He endorses taking a couple of doses of Metformin, but decided to stop it on his own. Pt declines restarting Metformin today for his hunger/cravings. Reminded pt role of simple carbs and insulin levels on hunger and cravings. Pt will  continue working on increasing protein intake for hunger/craving suppression and reducing simple and added sugars in his diet.    Vitamin D deficiency Assessment & Plan: Lab Results  Component Value Date   VD25OH 48.8 06/23/2023   VD25OH 42.9 01/05/2023   VD25OH 43.3 07/21/2022   Pt is currently on ERGO 50,000 units  weekly without adverse SE. His VD levels are essentially at goal.  Continue supplement and weight loss efforts. Continue to recheck periodically.   Follow up:   Return 09/06/2023. He was informed of the importance of frequent follow up visits to maximize his success with intensive lifestyle modifications for his multiple health conditions.  Subjective:   Chief complaint: Obesity Jonathan Dyer is here to discuss his progress with his obesity treatment plan. He is keeping a food journal and adhering to recommended goals of 1300-1400 calories and 120++ grams of  protein using the CAT 2 MP as a guide. States he is following his eating plan approximately 80% of the time. He states he is doing weights, biking, and walking 20-60 minutes, 5-7 days a week.   Interval History:  Jonathan Dyer is here for a follow up office visit. Since last OV on 06/23/2023, Jonathan Dyer is down 6 lbs. He adhered to his journaling parameters for 15 days in the past 33 days. He does report sometimes having hunger/cravings for carbs. Will be having knee surgery in the future.   Pharmacotherapy for weight loss: He is currently taking no anti-obesity medication.   Review of Systems:  Pertinent positives were addressed with patient today.  Reviewed by clinician on day of visit: allergies, medications, problem list, medical history, surgical history, family history, social history, and previous encounter notes.  Weight Summary and Biometrics   Weight Lost Since Last Visit: 6lb  Weight Gained Since Last Visit: 0   Vitals Temp: 97.9 F (36.6 C) BP: 122/72 Pulse Rate: (!) 51 SpO2: 96 %   Anthropometric Measurements Height: 6\' 3"  (1.905 m) Weight: 265 lb (120.2 kg) BMI (Calculated): 33.12 Weight at Last Visit: 271lb Weight Lost Since Last Visit: 6lb Weight Gained Since Last Visit: 0 Starting Weight: 274lb Total Weight Loss (lbs): 9 lb (4.082 kg)   Body Composition  Body Fat %: 32.9 % Fat Mass (lbs): 87.4  lbs Muscle Mass (lbs): 169.2 lbs Total Body Water (lbs): 116.6 lbs Visceral Fat Rating : 20   Other Clinical Data Fasting: no Labs: no Today's Visit #: 20 Starting Date: 01/27/22    Objective:   PHYSICAL EXAM: Blood pressure 122/72, pulse (!) 51, temperature 97.9 F (36.6 C), height 6\' 3"  (1.905 m), weight 265 lb (120.2 kg), SpO2 96%. Body mass index is 33.12 kg/m.  General: he is overweight, cooperative and in no acute distress. PSYCH: Has normal mood, affect and thought process.   HEENT: EOMI, sclerae are anicteric. Lungs: Normal breathing effort, no conversational dyspnea. Extremities: Moves * 4 Neurologic: A and O * 3, good insight  DIAGNOSTIC DATA REVIEWED: BMET    Component Value Date/Time   NA 143 06/23/2023 1055   K 4.4 06/23/2023 1055   CL 103 06/23/2023 1055   CO2 24 06/23/2023 1055   GLUCOSE 87 06/23/2023 1055   GLUCOSE 86 04/01/2021 0813   BUN 19 06/23/2023 1055   CREATININE 1.44 (H) 06/23/2023 1055   CREATININE 1.34 (H) 10/31/2020 0946   CALCIUM 9.5 06/23/2023 1055   GFRNONAA 60 (L) 02/04/2021 0814   GFRNONAA 54 (L) 10/31/2020 0946   GFRAA 63 10/31/2020 0946   Lab  Results  Component Value Date   HGBA1C 5.6 06/23/2023   HGBA1C 5.7 10/21/2018   Lab Results  Component Value Date   INSULIN 12.2 06/23/2023   INSULIN 12.6 01/27/2022   Lab Results  Component Value Date   TSH 2.560 07/21/2022   CBC    Component Value Date/Time   WBC 7.0 03/17/2023 1048   WBC 6.1 06/28/2018 0909   RBC 5.08 03/17/2023 1048   RBC 4.92 10/21/2018 0000   HGB 15.5 03/17/2023 1048   HCT 45.8 03/17/2023 1048   PLT 283 03/17/2023 1048   MCV 90 03/17/2023 1048   MCH 30.5 03/17/2023 1048   MCH 31.0 06/28/2018 0909   MCHC 33.8 03/17/2023 1048   MCHC 33.9 06/28/2018 0909   RDW 12.2 03/17/2023 1048   Iron Studies    Component Value Date/Time   IRON 86 07/21/2022 0802   TIBC 280 07/21/2022 0802   FERRITIN 228 07/21/2022 0802   IRONPCTSAT 31 07/21/2022 0802    Lipid Panel     Component Value Date/Time   CHOL 161 01/05/2023 1114   TRIG 133 01/05/2023 1114   HDL 43 01/05/2023 1114   CHOLHDL 3.7 01/05/2023 1114   CHOLHDL 6 04/01/2021 0813   VLDL 24.4 04/01/2021 0813   LDLCALC 94 01/05/2023 1114   LDLCALC 131 (H) 10/03/2019 0926   Hepatic Function Panel     Component Value Date/Time   PROT 7.1 06/23/2023 1055   ALBUMIN 4.4 06/23/2023 1055   AST 19 06/23/2023 1055   ALT 24 06/23/2023 1055   ALKPHOS 102 06/23/2023 1055   BILITOT 0.5 06/23/2023 1055      Component Value Date/Time   TSH 2.560 07/21/2022 0802   Nutritional Lab Results  Component Value Date   VD25OH 48.8 06/23/2023   VD25OH 42.9 01/05/2023   VD25OH 43.3 07/21/2022    Attestations:   I, Special Puri, acting as a Stage manager for [employer], DO., have compiled all relevant documentation for today's office visit on behalf of Thomasene Lot, DO, while in the presence of [employer], DO.  Reviewed by clinician on day of visit: allergies, medications, problem list, medical history, surgical history, family history, social history, and previous encounter notes pertinent to patient's obesity diagnosis. I have spent 43 minutes in the care of the patient today including: preparing to see patient (e.g. review and interpretation of tests, old notes ), obtaining and/or reviewing separately obtained history, performing a medically appropriate examination or evaluation, counseling and educating the patient, ordering medications, test or procedures, documenting clinical information in the electronic or other health care record, and independently interpreting results and communicating results to the patient, family, or caregiver   I have reviewed the above documentation for accuracy and completeness, and I agree with the above. Jonathan Dyer, D.O.  The 21st Century Cures Act was signed into law in 2016 which includes the topic of electronic health records.  This  provides immediate access to information in MyChart.  This includes consultation notes, operative notes, office notes, lab results and pathology reports.  If you have any questions about what you read please let us know at your next visit so we can discuss your concerns and take corrective action if need be.  We are right here with you.

## 2023-08-02 ENCOUNTER — Ambulatory Visit: Payer: Medicare Other

## 2023-08-03 ENCOUNTER — Encounter: Payer: Self-pay | Admitting: Nurse Practitioner

## 2023-08-03 ENCOUNTER — Ambulatory Visit: Attending: Nurse Practitioner | Admitting: Nurse Practitioner

## 2023-08-03 ENCOUNTER — Encounter: Payer: Self-pay | Admitting: Pharmacy Technician

## 2023-08-03 ENCOUNTER — Telehealth: Payer: Self-pay | Admitting: Nurse Practitioner

## 2023-08-03 ENCOUNTER — Telehealth: Payer: Self-pay | Admitting: Pharmacy Technician

## 2023-08-03 ENCOUNTER — Other Ambulatory Visit (HOSPITAL_COMMUNITY): Payer: Self-pay

## 2023-08-03 DIAGNOSIS — Z01810 Encounter for preprocedural cardiovascular examination: Secondary | ICD-10-CM | POA: Diagnosis not present

## 2023-08-03 NOTE — Telephone Encounter (Signed)
 Patient Advocate Encounter   The patient was approved for a [employer] grant that will help cover the cost of repatha Total amount awarded, 2500.00.  Effective: 07/04/23 - 07/02/24   WUJ:[id number] NWG:[id number] QMVHQ:[id number] [employer] ID: [id number]   Pharmacy provided with approval and processing information. Patient informed via telephone/mychart  Verified with pharmacy its a deductible, his copay shouldn't be the 500.00 next time but he has this grant.

## 2023-08-03 NOTE — Telephone Encounter (Signed)
 Out of pocket cost for Repatha $500 which pt states he cannot afford at this time. Would you please see if [employer] of [employer] are more cost effective?   Please contact patient to advise. I called him today for a virtual visit for preop.   Thank you, Marcelino Duster

## 2023-08-03 NOTE — Telephone Encounter (Signed)
 Thank you :)

## 2023-08-03 NOTE — Progress Notes (Signed)
 Virtual Visit via Telephone Note   Because of Jonathan Dyer co-morbid illnesses, he is at least at moderate risk for complications without adequate follow up.  This format is felt to be most appropriate for this patient at this time.  Due to technical limitations with video connection (technology), today's appointment will be conducted as an audio only telehealth visit, and Jonathan Dyer verbally agreed to proceed in this manner.   All issues noted in this document were discussed and addressed.  No physical exam could be performed with this format.  Evaluation Performed:  Preoperative cardiovascular risk assessment _____________   Date:  08/03/2023   Patient ID:  Jonathan Dyer, DOB 12/13/1955, MRN [id number] Patient Location:  Home Provider location:   Office  Primary Care Provider:  Billie Lade, MD Primary Cardiologist:  Jonathan Dell, MD  Chief Complaint / Patient Profile   68 y.o. y/o male with a h/o atrial fibrillation, s/p LAAO closure device, hyperlipidemia, HTN, dilated aortic aneurysm, CAD with elevated coronary artery calcium score and no prior PCI who is pending prostate biopsy with Dr. Marlou Dyer on 08/09/23 and revision right total knee arthroplasty with Dr. Blanchie Dyer on date TBD and presents today for telephonic preoperative cardiovascular risk assessment.  History of Present Illness    Jonathan Dyer is a 68 y.o. male who presents via audio/video conferencing for a telehealth visit today.  Pt was last seen in cardiology clinic on 03/17/23 by Jonathan Chard, NP.  At that time Jonathan Dyer was doing well.  The patient is now pending procedure as outlined above. Since his last visit, he  denies chest pain, shortness of breath, lower extremity edema, fatigue, palpitations, melena, hematuria, hemoptysis, diaphoresis, weakness, presyncope, syncope, orthopnea, and PND. He remains active with regular cycling and weight lifting. He does not have any  concerning cardiac symptoms with exertion.  Past Medical History    Past Medical History:  Diagnosis Date   Allergy    Arthritis    Atrial fibrillation (HCC)    Diagnosed 09/2009, on flecainide   Back pain    Bilateral swelling of feet    Bradycardia    Event monitor 2010: HR down to 45, asymptomatic.   Cataract    Coronary atherosclerosis of native coronary artery    Nonobstructive and distal disease by cath 2005.    Essential hypertension    Hyperlipidemia    Joint pain    OSA (obstructive sleep apnea)    Osteoarthritis    Overweight    Prediabetes    Presence of Watchman left atrial appendage closure device 02/04/2023   Watchman 27mm FLX Pro placed by Dr. Lalla Dyer   Renal cyst    Ringing in ears    Sleep apnea    Vitamin D deficiency    Past Surgical History:  Procedure Laterality Date   BIOPSY  01/04/2018   Procedure: BIOPSY;  Surgeon: Jonathan Bali, MD;  Location: [facility];  Service: Endoscopy;;  ascending colon    CARDIAC CATHETERIZATION  2005   COLONOSCOPY  05/2006   SLF: 3 mm sigmoid: Tubular adenoma removed.   COLONOSCOPY N/A 05/06/2016   Dr. Darrick Dyer: two sessile polyps in mid transverse colon and hepatic flexure (tubular adenomas). ext/int hemorrhoids. tortuous left colon.    COLONOSCOPY WITH PROPOFOL N/A 01/04/2018   Procedure: COLONOSCOPY WITH PROPOFOL;  Surgeon: Jonathan Bali, MD;  Location: [facility];  Service: Endoscopy;  Laterality: N/A;  2:30pm   KNEE ARTHROSCOPY  Left anterior cruciate ligament reconstruction     LEFT ATRIAL APPENDAGE OCCLUSION N/A 02/04/2023   Procedure: LEFT ATRIAL APPENDAGE OCCLUSION;  Surgeon: Jonathan Prude, MD;  Location: [facility];  Service: Cardiovascular;  Laterality: N/A;   left shoulder surgery     dr . Jonathan Dyer    POLYPECTOMY  05/06/2016   Procedure: POLYPECTOMY;  Surgeon: Jonathan Bali, MD;  Location: [facility];  Service: Endoscopy;;  transverse colon polyp, hepatic flexure polyp,     Right shoulder surgery     ROBOT ASSISTED LAPAROSCOPIC NEPHRECTOMY Left 07/04/2018   Procedure: XI ROBOTIC ASSISTED LAPAROSCOPIC RENAL CYST DECORTICATION;  Surgeon: Malen Gauze, MD;  Location: [facility];  Service: Urology;  Laterality: Left;  3 HRSPROCEDURE: ROBOTIC RENAL CYST DECORTICATION   TEE WITHOUT CARDIOVERSION N/A 02/04/2023   Procedure: TRANSESOPHAGEAL ECHOCARDIOGRAM;  Surgeon: Jonathan Prude, MD;  Location: [facility] INVAS[facility] CV LAB;  Service: Cardiovascular;  Laterality: N/A;   TONSILLECTOMY     TOTAL KNEE ARTHROPLASTY      Allergies  Allergies  Allergen Reactions   Clindamycin/Lincomycin Swelling and Other (See Comments)    Mycin drug family    Penicillin G Other (See Comments)   Penicillins Other (See Comments)    Has patient had a PCN reaction causing immediate rash, facial/tongue/throat swelling, SOB or lightheadedness with hypotension: unknown Has patient had a PCN reaction causing severe rash involving mucus membranes or skin necrosis: unknown Has patient had a PCN reaction that required hospitalization unknown Has patient had a PCN reaction occurring within the last 10 years: unknown If all of the above answers are "NO", then may proceed    Pneumococcal 13-Val Conj Vacc Swelling    Patient stated that he has severe swelling in his arm from vaccine and swollen up to his collar bone. Lasted about 2 days and did not feel well.     Home Medications    Prior to Admission medications   Medication Sig Start Date End Date Taking? Authorizing Provider  amLODipine (NORVASC) 10 MG tablet TAKE 1 TABLET BY MOUTH DAILY 02/15/23   Jonelle Sidle, MD  aspirin EC 81 MG tablet Take 81 mg by mouth daily. NOT STARTED YET, PT WILL START AFTER HIS PROSTATE Bx, see notes in the chart.    [provider]  cephALEXin (KEFLEX) 500 MG capsule Take 1 capsule (500 mg total) by mouth as needed. Take one tablet by mouth, one hour prior to dental cleanings 03/17/23   Filbert Schilder, NP   Evolocumab (REPATHA SURECLICK) 140 MG/ML SOAJ Inject 140 mg into the skin every 14 (fourteen) days. 08/17/22   Jonelle Sidle, MD  flecainide (TAMBOCOR) 50 MG tablet TAKE 1 TABLET BY MOUTH TWICE  DAILY 02/15/23   Jonelle Sidle, MD  furosemide (LASIX) 40 MG tablet TAKE 1 TABLET BY MOUTH DAILY AS  NEEDED FOR FLUID Patient not taking: Reported on 07/27/2023 04/06/22   Jonelle Sidle, MD  losartan (COZAAR) 100 MG tablet TAKE 1 TABLET BY MOUTH DAILY 02/15/23   Jonelle Sidle, MD  Multiple Vitamin (MULTIVITAMIN) tablet Take 1 tablet by mouth daily.    [provider]  omeprazole (PRILOSEC) 20 MG capsule TAKE 1 CAPSULE BY MOUTH DAILY AS NEEDED FOR ACID REFLUX,  INDIGESTION Patient not taking: Reported on 07/27/2023 04/06/22   Jonelle Sidle, MD  tamsulosin (FLOMAX) 0.4 MG CAPS capsule Take 0.4 mg by mouth. Takes occasionally. Patient not taking: Reported on 07/27/2023    [provider]  Vitamin D, Ergocalciferol, (DRISDOL) 1.25 MG (50000 UNIT) CAPS capsule Take 1 capsule (50,000 Units total) by mouth every 7 (seven) days. 05/31/23   Thomasene Lot, DO    Physical Exam    Vital Signs:  Mckinnon Bernestine Amass does not have vital signs available for review today.  Given telephonic nature of communication, physical exam is limited. AAOx3. NAD. Normal affect.  Speech and respirations are unlabored.  Accessory Clinical Findings    None  Assessment & Plan    1.  Preoperative Cardiovascular Risk Assessment: According to the Revised Cardiac Risk Index (RCRI), his Perioperative Risk of Major Cardiac Event is (%): 0.4. His Functional Capacity in METs is: 6.61 according to the Duke Activity Status Index (DASI). The patient is doing well from a cardiac perspective. Therefore, based on ACC/AHA guidelines, the patient would be at acceptable risk for the planned procedure without further cardiovascular testing.   The patient was advised that if he develops new symptoms prior to  surgery to contact our office to arrange for a follow-up visit, and he verbalized understanding.  Per office protocol, he may hold aspirin for 5-7 days prior to procedure and should resume as soon as hemodynamically stable postoperatively.  A copy of this note will be routed to requesting surgeon.  Time:   Today, I have spent 10 minutes with the patient with telehealth technology discussing medical history, symptoms, and management plan.    Levi Aland, NP-C  08/03/2023, 9:21 AM [address] Office [phone] Fax [phone]

## 2023-08-27 ENCOUNTER — Ambulatory Visit: Attending: Nurse Practitioner

## 2023-08-27 ENCOUNTER — Telehealth: Payer: Self-pay | Admitting: Nurse Practitioner

## 2023-08-27 ENCOUNTER — Other Ambulatory Visit: Payer: Self-pay | Admitting: Nurse Practitioner

## 2023-08-27 ENCOUNTER — Encounter: Payer: Self-pay | Admitting: Nurse Practitioner

## 2023-08-27 ENCOUNTER — Ambulatory Visit: Attending: Nurse Practitioner | Admitting: Nurse Practitioner

## 2023-08-27 VITALS — BP 130/76 | HR 62 | Ht 75.0 in | Wt 275.0 lb

## 2023-08-27 DIAGNOSIS — I2510 Atherosclerotic heart disease of native coronary artery without angina pectoris: Secondary | ICD-10-CM | POA: Diagnosis not present

## 2023-08-27 DIAGNOSIS — I480 Paroxysmal atrial fibrillation: Secondary | ICD-10-CM | POA: Diagnosis not present

## 2023-08-27 DIAGNOSIS — I77819 Aortic ectasia, unspecified site: Secondary | ICD-10-CM

## 2023-08-27 DIAGNOSIS — R002 Palpitations: Secondary | ICD-10-CM

## 2023-08-27 DIAGNOSIS — I7121 Aneurysm of the ascending aorta, without rupture: Secondary | ICD-10-CM | POA: Diagnosis not present

## 2023-08-27 DIAGNOSIS — I10 Essential (primary) hypertension: Secondary | ICD-10-CM | POA: Diagnosis not present

## 2023-08-27 DIAGNOSIS — E782 Mixed hyperlipidemia: Secondary | ICD-10-CM | POA: Diagnosis not present

## 2023-08-27 NOTE — Telephone Encounter (Signed)
 Checking percert on the following patient for  1 week zio xt palpitations

## 2023-08-27 NOTE — Patient Instructions (Addendum)
 Medication Instructions:  Your physician recommends that you continue on your current medications as directed. Please refer to the Current Medication list given to you today.  Labwork: In 1 week at [facility]   Testing/Procedures: .Your physician has recommended that you wear a Zio monitor.   This monitor is a medical device that records the heart's electrical activity. Doctors most often use these monitors to diagnose arrhythmias. Arrhythmias are problems with the speed or rhythm of the heartbeat. The monitor is a small device applied to your chest. You can wear one while you do your normal daily activities. While wearing this monitor if you have any symptoms to push the button and record what you felt. Once you have worn this monitor for the period of time provider prescribed (for 7 days), you will return the monitor device in the postage paid box. Once it is returned they will download the data collected and provide us  with a report which the provider will then review and we will call you with those results. Important tips:  Avoid showering during the first 24 hours of wearing the monitor. Avoid excessive sweating to help maximize wear time. Do not submerge the device, no hot tubs, and no swimming pools. Keep any lotions or oils away from the patch. After 24 hours you may shower with the patch on. Take brief showers with your back facing the shower head.  Do not remove patch once it has been placed because that will interrupt data and decrease adhesive wear time. Push the button when you have any symptoms and write down what you were feeling. Once you have completed wearing your monitor, remove and place into box which has postage paid and place in your outgoing mailbox.  If for some reason you have misplaced your box then call our office and we can provide another box and/or mail it off for you.  Follow-Up: Your physician recommends that you schedule a follow-up appointment in: 6-8  weeks   Any Other Special Instructions Will Be Listed Below (If Applicable).  If you need a refill on your cardiac medications before your next appointment, please call your pharmacy.

## 2023-08-27 NOTE — Progress Notes (Signed)
 Cardiology Office Note:  .   Date: 11/30/2022 ID:  Jonathan Dyer, DOB 1955/09/27, MRN [id number] PCP: Tobi Fortes, MD  [facility] Providers Cardiologist:  Teddie Favre, MD Electrophysiologist:  Boyce Byes, MD {  History of Present Illness: .   Jonathan Dyer is a 68 y.o. male with a PMH of paroxysmal to persistent A-fib, mixed hyperlipidemia with history of statin myalgias, hypertension, ascending aortic aneurysm, who presents today for 40-month follow-up.  Last seen by Dr. Londa Rival on Sep 25, 2022.  He was very active at the time riding his bike.  Was overall doing well from a cardiac perspective.  He was planning on watchman implantation per Dr. Marven Slimmer in September.  This has been scheduled for February 04, 2023.  Today presents for 59-month follow-up.  He states he is doing well. Denies any chest pain, shortness of breath, palpitations, syncope, presyncope, dizziness, orthopnea, PND, swelling or significant weight changes, acute bleeding, or claudication. Has some questions regarding the recovery time and what to expect regarding his upcoming surgery in September with Dr. Marven Slimmer. Still remains very active.   S/p LAAO with Watman device implant on 02/04/2023. Noted some fatigue during office visit on 03/17/2023, felt it was related to Eliquis . Overall was doing well.   Studies Reviewed: .    Lexiscan  07/2022:    Findings are consistent with no ischemia. The study is low risk.   No ST deviation was noted. The ECG was negative for ischemia.  Low risk Duke treadmill score of 9.   LV perfusion is equivocal.  Small, mild intensity, mid to apical inferolateral defect that is predominantly fixed with partial reversibility suggestive of variable soft tissue attenuation artifact.  Importantly, no large ischemic territories are noted.   Left ventricular function is normal. Nuclear stress EF: 53 %.   Low risk study with Duke treadmill score of 9, no exercise-induced  arrhythmias, and perfusion imaging most consistent with variable soft tissue attenuation.  No large ischemic territories are noted.  LVEF 53%.  CT cardiac 07/2022:  IMPRESSION: 1. Fusiform aneurysmal dilatation of the ascending aorta, maximal dimension 4.3 cm. This retrospectively appears stable to 2011 exam recommend annual imaging followup by CTA or MRA. This recommendation follows 2010 ACCF/AHA/AATS/ACR/ASA/SCA/SCAI/SIR/STS/SVM Guidelines for the Diagnosis and Management of Patients with Thoracic Aortic Disease. Circulation. 2010; 121: N829-F621. Aortic aneurysm NOS (ICD10-I71.9) 2. Peri fissural nodule most consistent with intrapulmonary lymph node on the right, needing no further follow-up.   Echo 04/2022:  1. Left ventricular ejection fraction, by estimation, is 55 to 60%. The  left ventricle has normal function. The left ventricle has no regional  wall motion abnormalities. Left ventricular diastolic parameters are  indeterminate. The average left  ventricular global longitudinal strain is -22.7 %. The global longitudinal  strain is normal.   2. Right ventricular systolic function not well visualized but appears to  be mildly reduced. The right ventricular size is mildly enlarged.   3. The mitral valve is grossly normal. Mild mitral valve regurgitation.  No evidence of mitral stenosis.   4. The aortic valve is tricuspid. There is mild calcification of the  aortic valve. Aortic valve regurgitation is not visualized. No aortic  stenosis is present.   5. Aortic dilatation noted. There is mild dilatation of the aortic root,  measuring 44 mm. There is mild dilatation of the ascending aorta,  measuring 44 mm. There is mild dilatation of the aortic arch, measuring 40 mm.   Risk Assessment/Calculations:  CHA2DS2-VASc Score =    This indicates a  % annual risk of stroke. The patient's score is based upon:     No BP recorded.  {Refresh Note OR Click here to enter BP  :1}***  The  10-year ASCVD risk score (Arnett DK, et al., 2019) is: 18.6%   Values used to calculate the score:     Age: 12 years     Sex: Male     Is Non-Hispanic African American: No     Diabetic: No     Tobacco smoker: No     Systolic Blood Pressure: 122 mmHg     Is BP treated: Yes     HDL Cholesterol: 43 mg/dL     Total Cholesterol: 161 mg/dL      Physical Exam:   VS:  There were no vitals taken for this visit.   Wt Readings from Last 3 Encounters:  07/27/23 273 lb 3.2 oz (123.9 kg)  07/27/23 265 lb (120.2 kg)  06/23/23 271 lb (122.9 kg)    GEN: Well nourished, well developed in no acute distress NECK: No JVD; No carotid bruits CARDIAC: S1/S2, RRR, no murmurs, rubs, gallops RESPIRATORY:  Clear to auscultation without rales, wheezing or rhonchi  ABDOMEN: Soft, non-tender, non-distended EXTREMITIES:  No edema; No deformity   ASSESSMENT AND PLAN: .    Paroxysmal to persistent A-fib Denies any tachycardia or palpitations.  Heart rate well-controlled today.  CHA2DS2-VASc score 4.  He is scheduled for upcoming watchman implantation with Dr. Marven Slimmer in September.  Continue current medication regimen. Not on OAC.   Coronary artery calcification Coronary CTA in March 2024 revealed coronary artery calcium  score 5250. Stable with no anginal symptoms. No indication for ischemic evaluation. Continue current medication regimen.   Mixed HLD, medication management Hx of intolerance to statins. Appears he started on Repatha  earlier this year. He is due for repeat labs. Will obtain FLP and LFT at this time. Heart healthy diet and regular cardiovascular exercise encouraged.   HTN BP stable. Discussed to monitor BP at home at least 2 hours after medications and sitting for 5-10 minutes. Continue current medication regimen. Heart healthy diet and regular cardiovascular exercise encouraged.   Ascending aortic aneurysm, aortic dilatation CT cardiac scan this year revealed fusiform aneurysmal dilatation of  ascending aorta with maximal dimension at 4.3 cm.  Annual imaging recommended by CTA or MRA, would recommend repeating imaging 07/2023.  Echocardiogram 04/2022 revealed mild dilatation aortic root at 44 mm with mild dilatation of aortic arch measuring 40 mm.  As stated above, plan to repeat CTA 07/2023 for monitoring. Care and ED precautions discussed.     Dispo: Follow-up in 4 months with me or APP or sooner if anything changes.  Signed, Lasalle Pointer, NP

## 2023-08-31 ENCOUNTER — Ambulatory Visit (INDEPENDENT_AMBULATORY_CARE_PROVIDER_SITE_OTHER): Admitting: Gastroenterology

## 2023-08-31 ENCOUNTER — Encounter: Payer: Self-pay | Admitting: Gastroenterology

## 2023-08-31 VITALS — BP 126/71 | HR 52 | Temp 97.4°F | Ht 75.0 in | Wt 269.0 lb

## 2023-08-31 DIAGNOSIS — Z860101 Personal history of adenomatous and serrated colon polyps: Secondary | ICD-10-CM

## 2023-08-31 DIAGNOSIS — R1013 Epigastric pain: Secondary | ICD-10-CM | POA: Diagnosis not present

## 2023-08-31 DIAGNOSIS — K862 Cyst of pancreas: Secondary | ICD-10-CM | POA: Diagnosis not present

## 2023-08-31 DIAGNOSIS — M6208 Separation of muscle (nontraumatic), other site: Secondary | ICD-10-CM | POA: Insufficient documentation

## 2023-08-31 NOTE — Progress Notes (Signed)
 Gastroenterology Office Note     Primary Care Physician:  Towanda Fret, MD  Primary Gastroenterologist: Dr. Mordechai April   Chief Complaint   Chief Complaint  Patient presents with   Abdominal Pain    Follow up appt. Having concerns about having epigastric pain. Feels it when lying on it, if pushing on that area or lifting weights. Started years ago. Seems to be gettting worse.      History of Present Illness   Jonathan Dyer is a 68 y.o. male presenting today with a history of chronic GERD, history of adenomas with last colonoscopy in 2019 (ischemic colitis at that time), and surveillance due in 2024, and pancreatic cysts. Most recent MR abdomen Oct 2022 with stable 9 mm cystic lesion and additional smaller cystic lesions in pancreatic tail that are stable. Plan for annual MRI monitor of pancreatic cystic lesions for 5 years, then every 2 years for X 2 per previous recommendations-given age at onset less than 55. Oct 2023 will be 5 years, whereby next is due Oct 2025 and then again in Oct 2027.   Presents today in follow-up. He notes chronic epigastric pain right below sternum, present at least 5 years if not longer, no N/V, only exacerbated when laying on stomach or lifting weights. He notes bulging at times. No postprandial abdominal pain. Good appetite. No dysphagia. Very seldom any GERD symptoms. He has modified his diet with good results and only takes omeprazole  prn once every 3-4 months. No overt Gi bleeding. No changes in bowel habits.      Colonoscopy 2019: normal TI, ischemic colitis. Colonoscopy due in 2024. He is wanting to hold off on surveillance right now due to upcoming knee I&D. He is declining EGD.  No FH colon cancer.   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 2 year surveillance recommended.    Past Medical History:  Diagnosis Date   Allergy    Arthritis    Atrial fibrillation (HCC)    Diagnosed 09/2009, on  flecainide    Back pain    Bilateral swelling of feet    Bradycardia    Event monitor 2010: HR down to 45, asymptomatic.   Cataract    Coronary atherosclerosis of native coronary artery    Nonobstructive and distal disease by cath 2005.    Essential hypertension    Hyperlipidemia    Joint pain    OSA (obstructive sleep apnea)    Osteoarthritis    Overweight    Prediabetes    Presence of Watchman left atrial appendage closure device 02/04/2023   Watchman 27mm FLX Pro placed by Dr. Marven Slimmer   Renal cyst    Ringing in ears    Sleep apnea    Vitamin D  deficiency     Past Surgical History:  Procedure Laterality Date   BIOPSY  01/04/2018   Procedure: BIOPSY;  Surgeon: Alyce Jubilee, MD;  Location: [facility];  Service: Endoscopy;;  ascending colon    CARDIAC CATHETERIZATION  2005   COLONOSCOPY  05/2006   SLF: 3 mm sigmoid: Tubular adenoma removed.   COLONOSCOPY N/A 05/06/2016   Dr. Nolene Baumgarten: two sessile polyps in mid transverse colon and hepatic flexure (tubular adenomas). ext/int hemorrhoids. tortuous left colon.    COLONOSCOPY WITH PROPOFOL  N/A 01/04/2018   Procedure: COLONOSCOPY WITH PROPOFOL ;  Surgeon: Alyce Jubilee, MD;  Location: [facility];  Service: Endoscopy;  Laterality: N/A;  2:30pm   KNEE  ARTHROSCOPY     Left anterior cruciate ligament reconstruction     LEFT ATRIAL APPENDAGE OCCLUSION N/A 02/04/2023   Procedure: LEFT ATRIAL APPENDAGE OCCLUSION;  Surgeon: Boyce Byes, MD;  Location: [facility];  Service: Cardiovascular;  Laterality: N/A;   left shoulder surgery     dr . Porfirio Bristol collins    POLYPECTOMY  05/06/2016   Procedure: POLYPECTOMY;  Surgeon: Alyce Jubilee, MD;  Location: [facility];  Service: Endoscopy;;  transverse colon polyp, hepatic flexure polyp,    Right shoulder surgery     ROBOT ASSISTED LAPAROSCOPIC NEPHRECTOMY Left 07/04/2018   Procedure: XI ROBOTIC ASSISTED LAPAROSCOPIC RENAL CYST DECORTICATION;  Surgeon: Marco Severs, MD;   Location: [facility];  Service: Urology;  Laterality: Left;  3 HRSPROCEDURE: ROBOTIC RENAL CYST DECORTICATION   TEE WITHOUT CARDIOVERSION N/A 02/04/2023   Procedure: TRANSESOPHAGEAL ECHOCARDIOGRAM;  Surgeon: Boyce Byes, MD;  Location: [facility];  Service: Cardiovascular;  Laterality: N/A;   TONSILLECTOMY     TOTAL KNEE ARTHROPLASTY      Current Outpatient Medications  Medication Sig Dispense Refill   amLODipine  (NORVASC ) 10 MG tablet TAKE 1 TABLET BY MOUTH DAILY 100 tablet 2   aspirin  EC 81 MG tablet Take 81 mg by mouth daily. NOT STARTED YET, PT WILL START AFTER HIS PROSTATE Bx, see notes in the chart.     cephALEXin  (KEFLEX ) 500 MG capsule Take 1 capsule (500 mg total) by mouth as needed. Take one tablet by mouth, one hour prior to dental cleanings 3 capsule 0   Coenzyme Q10 (CO Q-10) 100 MG CAPS Take 1 tablet by mouth daily.     Evolocumab  (REPATHA  SURECLICK) 140 MG/ML SOAJ Inject 140 mg into the skin every 14 (fourteen) days. 2 mL 11   flecainide  (TAMBOCOR ) 50 MG tablet TAKE 1 TABLET BY MOUTH TWICE  DAILY 200 tablet 2   furosemide  (LASIX ) 40 MG tablet TAKE 1 TABLET BY MOUTH DAILY AS  NEEDED FOR FLUID 90 tablet 1   losartan  (COZAAR ) 100 MG tablet TAKE 1 TABLET BY MOUTH DAILY 100 tablet 2   Multiple Vitamin (MULTIVITAMIN) tablet Take 1 tablet by mouth daily.     omeprazole  (PRILOSEC) 20 MG capsule TAKE 1 CAPSULE BY MOUTH DAILY AS NEEDED FOR ACID REFLUX,  INDIGESTION (Patient taking differently: Take 20 mg by mouth daily as needed.) 90 capsule 1   tamsulosin  (FLOMAX ) 0.4 MG CAPS capsule Take 0.4 mg by mouth 2 (two) times a week. Takes occasionally.     Vitamin D , Ergocalciferol , (DRISDOL ) 1.25 MG (50000 UNIT) CAPS capsule Take 1 capsule (50,000 Units total) by mouth every 7 (seven) days. 13 capsule 0   No current facility-administered medications for this visit.    Allergies as of 08/31/2023 - Review Complete 08/31/2023  Allergen Reaction Noted   Clindamycin/lincomycin  Swelling and Other (See Comments) 01/03/2018   Penicillin g Other (See Comments) 04/06/2022   Penicillins Other (See Comments)    Pneumococcal 13-val conj vacc Swelling 08/15/2020    Family History  Problem Relation Age of Onset   Diabetes Mother    Pulmonary embolism Mother    Obesity Mother    Cancer Father    Hypertension Father    Lung disease Father    Heart disease Father    Arthritis Sister    Hyperlipidemia Brother    Hypertension Brother    Diabetes Other        Significant family h/o DM   Colon cancer Neg  Hx     Social History   Socioeconomic History   Marital status: Divorced    Spouse name: Not on file   Number of children: 3   Years of education: Not on file   Highest education level: Not on file  Occupational History   Occupation: PT-custodial work   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   Social Drivers of Corporate investment banker Strain: Low Risk  (02/16/2023)   Overall Financial Resource Strain (CARDIA)    Difficulty of Paying Living Expenses: Not hard at all  Food Insecurity: No Food Insecurity (02/16/2023)   Hunger Vital Sign    Worried About Running Out of Food in the Last Year: Never true    Ran Out of Food in the Last Year: Never true  Transportation Needs: No Transportation Needs (02/16/2023)   PRAPARE - Administrator, Civil Service (Medical): No    Lack of Transportation (Non-Medical): No  Physical Activity: Sufficiently Active (02/16/2023)   Exercise Vital Sign    Days of Exercise per Week: 4  days    Minutes of Exercise per Session: 60 min  Stress: No Stress Concern Present (02/16/2023)   [employer] of Occupational Health - Occupational Stress Questionnaire    Feeling of Stress : Not at all  Social Connections: Moderately Integrated (02/16/2023)   Social Connection and Isolation Panel [NHANES]    Frequency of Communication with Friends and Family: More than three times a week    Frequency of Social Gatherings with Friends and Family: More than three times a week    Attends Religious Services: More than 4 times per year    Active Member of [employer] or Organizations: Yes    Attends Engineer, structural: More than 4 times per year    Marital Status: Divorced  Intimate Partner Violence: Not At Risk (02/16/2023)   Humiliation, Afraid, Rape, and Kick questionnaire    Fear of Current or Ex-Partner: No    Emotionally Abused: No    Physically Abused: No    Sexually Abused: No     Review of Systems   Gen: Denies any fever, chills, fatigue, weight loss, lack of appetite.  CV: Denies chest pain, heart palpitations, peripheral edema, syncope.  Resp: Denies shortness of breath at rest or with exertion. Denies wheezing or cough.  GI: Denies dysphagia or odynophagia. Denies jaundice, hematemesis, fecal incontinence. GU : Denies urinary burning, urinary frequency, urinary hesitancy MS: Denies joint pain, muscle weakness, cramps, or limitation of movement.  Derm: Denies rash, itching, dry skin Psych: Denies depression, anxiety, memory loss, and confusion Heme: Denies bruising, bleeding, and enlarged lymph nodes.   Physical Exam   BP 126/71   Pulse (!) 52   Temp (!) 97.4 F (36.3 C) (Oral)   Ht 6\' 3"  (1.905 m)   Wt 269 lb (122 kg)   BMI 33.62 kg/m  General:   Alert and oriented. Pleasant and cooperative. Well-nourished and well-developed.  Head:  Normocephalic and atraumatic. Eyes:  Without icterus Abdomen:  +  BS, soft, non-tender and non-distended. No HSM noted. No  guarding or rebound. Rectus diastasis with valsalva maneuver and states this is location of discomfort with exertion.  Rectal:  Deferred  Msk:  Symmetrical without gross deformities. Normal posture. Extremities:  Without edema. Neurologic:  Alert and  oriented x4;  grossly normal neurologically. Skin:  Intact without significant lesions or rashes. Psych:  Alert and cooperative. Normal mood and affect.   Assessment   Jonathan Dyer is a 68 y.o. male presenting today with a history of chronic GERD, history of adenomas, and likely IPMN, last seen March 2023 and presenting today in follow-up and concerns for epigastric discomfort.  Epigastric discomfort: dating over 5 years and precipitated by pressure by laying or exerting while working out. No postprandial component or alarm features. He does have notable rectus diastasis, unable to rule out ventral hernia that could very well be the culprit for this. Doubt EGD would shed much light and would only recommend due to history of long-standing GERD; however, this is very well controlled now with diet/behavior modification and only taking omeprazole  once every few months. He wants to hold off on EGD at this time and will discuss at next visit.  IPMN: surveillance due in Oct 2025.   History of adenomas: overdue for surveillance and was due end of 2024. He would like to complete knee I&D first and then return in about 6 months to arrange. No FH colon cancer. No alarm signs/symptoms.    PLAN    EGD if willing Omeprazole  prn MRI in Oct 2025 due to history of pancreatic cyst Colonoscopy when able as overdue for surveillance Return in 6 months   Delman Ferns, PhD, [facility]

## 2023-08-31 NOTE — Patient Instructions (Signed)
 We will see you in 6 months and can discuss colonoscopy at that time! We will also arrange an MRI at that time.  Please call or message if your abdominal pain is related to eating, any nausea, vomiting, weight loss, or problems swallowing.  Good luck with surgery!  I enjoyed seeing you again today! I value our relationship and want to provide genuine, compassionate, and quality care. You may receive a survey regarding your visit with me, and I welcome your feedback! Thanks so much for taking the time to complete this. I look forward to seeing you again.      Delman Ferns, PhD, ANP-BC [facility]

## 2023-09-02 ENCOUNTER — Other Ambulatory Visit (HOSPITAL_COMMUNITY)
Admission: RE | Admit: 2023-09-02 | Discharge: 2023-09-02 | Disposition: A | Discharge status: Home / Self Care | Source: Ambulatory Visit | Attending: Nurse Practitioner | Admitting: Nurse Practitioner

## 2023-09-02 DIAGNOSIS — R002 Palpitations: Secondary | ICD-10-CM | POA: Insufficient documentation

## 2023-09-02 DIAGNOSIS — I480 Paroxysmal atrial fibrillation: Secondary | ICD-10-CM | POA: Diagnosis not present

## 2023-09-02 LAB — BASIC METABOLIC PANEL WITH GFR
Anion gap: 10 (ref 5–15)
BUN: 25 mg/dL — ABNORMAL HIGH (ref 8–23)
CO2: 23 mmol/L (ref 22–32)
Calcium: 9.1 mg/dL (ref 8.9–10.3)
Chloride: 105 mmol/L (ref 98–111)
Creatinine, Ser: 1.3 mg/dL — ABNORMAL HIGH (ref 0.61–1.24)
GFR, Estimated: 59 mL/min — ABNORMAL LOW (ref >60)
Glucose, Bld: 97 mg/dL (ref 70–99)
Potassium: 4.3 mmol/L (ref 3.5–5.1)
Sodium: 138 mmol/L (ref 135–145)

## 2023-09-02 LAB — CBC
HCT: 45.5 % (ref 39.0–52.0)
Hemoglobin: 15.2 g/dL (ref 13.0–17.0)
MCH: 29.5 pg (ref 26.0–34.0)
MCHC: 33.4 g/dL (ref 30.0–36.0)
MCV: 88.2 fL (ref 80.0–100.0)
Platelets: 246 10*3/uL (ref 150–400)
RBC: 5.16 MIL/uL (ref 4.22–5.81)
RDW: 11.9 % (ref 11.5–15.5)
WBC: 6.2 10*3/uL (ref 4.0–10.5)
nRBC: 0 % (ref 0.0–0.2)

## 2023-09-02 LAB — MAGNESIUM: Magnesium: 2.2 mg/dL (ref 1.7–2.4)

## 2023-09-03 ENCOUNTER — Ambulatory Visit: Admitting: Internal Medicine

## 2023-09-03 LAB — THYROID PANEL WITH TSH
Free Thyroxine Index: 1.9 (ref 1.2–4.9)
T3 Uptake Ratio: 27 % (ref 24–39)
T4, Total: 7.1 ug/dL (ref 4.5–12.0)
TSH: 1.45 u[IU]/mL (ref 0.450–4.500)

## 2023-09-04 ENCOUNTER — Encounter: Payer: Self-pay | Admitting: Internal Medicine

## 2023-09-04 NOTE — Assessment & Plan Note (Signed)
 Lipid panel updated in August 2024.  Total cholesterol 161 and LDL 94.  He remains on Repatha .  No changes indicated today.

## 2023-09-04 NOTE — Assessment & Plan Note (Signed)
 Currently under annual surveillance.  Followed by Dr. Dulcy Gibney. MRI of the prostate completed in December showed PI-RADS category 3 lesion of the left posterolateral and posterromedial peripheral zone of the base.  Jonathan Dyer reports that he will undergo repeat biopsy.

## 2023-09-04 NOTE — Assessment & Plan Note (Signed)
 S/p watchman implantation in September 2024.  Is post procedural course has been uncomplicated.  He will follow-up with cardiology next week.

## 2023-09-04 NOTE — Assessment & Plan Note (Signed)
 Symptoms remain well-controlled with as needed use of omeprazole.

## 2023-09-04 NOTE — Assessment & Plan Note (Signed)
 Stable renal function noted on labs from 2/12.  Remains on ARB.

## 2023-09-04 NOTE — Assessment & Plan Note (Signed)
 Remains adequately controlled on current antihypertensive regimen.  No medication changes are indicated today.

## 2023-09-04 NOTE — Assessment & Plan Note (Signed)
Symptoms remain well-controlled with Flomax.

## 2023-09-06 ENCOUNTER — Ambulatory Visit (INDEPENDENT_AMBULATORY_CARE_PROVIDER_SITE_OTHER): Admitting: Family Medicine

## 2023-09-06 ENCOUNTER — Encounter (INDEPENDENT_AMBULATORY_CARE_PROVIDER_SITE_OTHER): Payer: Self-pay | Admitting: Family Medicine

## 2023-09-06 VITALS — BP 127/74 | HR 52 | Temp 97.8°F | Ht 75.0 in | Wt 263.0 lb

## 2023-09-06 DIAGNOSIS — E559 Vitamin D deficiency, unspecified: Secondary | ICD-10-CM

## 2023-09-06 DIAGNOSIS — I10 Essential (primary) hypertension: Secondary | ICD-10-CM | POA: Diagnosis not present

## 2023-09-06 DIAGNOSIS — E669 Obesity, unspecified: Secondary | ICD-10-CM | POA: Diagnosis not present

## 2023-09-06 DIAGNOSIS — R7303 Prediabetes: Secondary | ICD-10-CM | POA: Diagnosis not present

## 2023-09-06 DIAGNOSIS — Z6832 Body mass index (BMI) 32.0-32.9, adult: Secondary | ICD-10-CM

## 2023-09-06 NOTE — Progress Notes (Signed)
 Rae Bugler, D.O.  ABFM, ABOM Specializing in Clinical Bariatric Medicine  Office located at: [address] [place], [place]  [address]   Assessment and Plan:  No orders of the defined types were placed in this encounter.  There are no discontinued medications.   No orders of the defined types were placed in this encounter.    FOR THE DISEASE OF OBESITY:  BMI 32.0-32.9,adult-current bmi 32.87 Obesity, Beginning BMI 34.25 Assessment & Plan: Since last office visit on 07/27/2023, patient's muscle mass has decreased by 3 lbs. Fat mass has increased by 1.2 lbs. Total body water  has increased by 2 lbs.  Counseling done on how various foods will affect these numbers and how to maximize success  Total lbs lost to date: 11 lbs Total weight loss percentage to date: 4.01%    Recommended Dietary Goals Johnathen is currently in the action stage of change. As such, his goal is to continue weight management plan.  He has agreed to: continue current plan   Behavioral Intervention We discussed the following today: Pasta options with 10:1 ratio, Peanut butter alternatives (PB2 powder), practice mindfulness eating and understand the difference between hunger signals and cravings and focusing on food with a 10:1 ratio of calories: grams of protein  Additional resources provided today: Handout on Examples of Low Glycemic Index and Low Calorie Fruits & Vegetables and Provided patient with personalized instruction and guidance on how to read labels  Evidence-based interventions for health behavior change were utilized today including the discussion of self monitoring techniques, problem-solving barriers and SMART goal setting techniques.   Regarding patient's less desirable eating habits and patterns, we employed the technique of small changes.   Pt will specifically work on: Accurately journaling all food intake for next visit.    Recommended Physical Activity Goals Andon has been  advised to work up to 300-450 minutes of moderate intensity aerobic activity a week and strengthening exercises 2-3 times per week for cardiovascular health, weight loss maintenance and preservation of muscle mass.   He has agreed to :  Think about enjoyable ways to increase daily physical activity and overcoming barriers to exercise   Pharmacotherapy We both agreed to : continue with nutritional and behavioral strategies   FOR ASSOCIATED CONDITIONS ADDRESSED TODAY:  Prediabetes Assessment & Plan: Audrene Blessing is not taking any medications for this condition. Diet/exercise approach. Cravings well controlled, but pt endorses hunger in the evenings. Reminded pt of importance of protein to decrease hunger. Encouraged to ensure he is getting enough protein at lunch and dinner to limit hunger in the evening. Continue journaling according to provided parameters. Will continue to monitor.    Vitamin D  deficiency Assessment & Plan: Audrene Blessing is on high dose OTC Vit D once weekly. No adverse SE reported. No concerns today in this regard. Continue with current supplementation regimen. Will recheck levels 3 months from last obtained results.    Essential hypertension Assessment & Plan: BP Readings from Last 3 Encounters:  09/06/23 127/74  08/31/23 126/71  08/27/23 130/76  Relevant medications: Norvasc  10 mg, Lasix  40 mg, and Cozaar  100 mg daily. Managed by cardiology. Condition well controlled, pt BP is at goal today. Continue current medication regimen. Continue low sodium- heart healthy MP. Will monitor condition closely alongside cardiology.   Follow up:   Return in about 5 weeks (around 10/13/2023). He was informed of the importance of frequent follow up visits to maximize his success with intensive lifestyle modifications for his multiple health conditions.  Subjective:   Chief complaint: Obesity Corvin is here to discuss his progress with his obesity treatment plan. He is keeping a food journal and  adhering to recommended goals of 1300-1400 calories and 120++ grams of  protein using the CAT 2 MP as a guide and states he is following his eating plan approximately 50% of the time. He states he is not exercising.  Interval History:  Prabhav Herold Slates is here for a follow up office visit. Since last OV on 07/27/2023, pt is down 10 lbs. He was doing well on his MP, then came down with gastroenteritis. He reports not exercising d/t recent sickness and biopsy. However, Audrene Blessing is going back to the gym this week. Additionally, his go-to snack has been peanut butter with crackers and eating 12 ounces of protein with salad for dinner. According to his journaling, pt is getting 1150-1700 cal and 80-125g of protein daily.  Pharmacotherapy for weight loss: He is currently taking no anti-obesity medication.   Review of Systems:  Pertinent positives were addressed with patient today.  Reviewed by clinician on day of visit: allergies, medications, problem list, medical history, surgical history, family history, social history, and previous encounter notes.  Weight Summary and Biometrics   Weight Lost Since Last Visit: 2lb  Weight Gained Since Last Visit: 0lb   Vitals Temp: 97.8 F (36.6 C) BP: 127/74 Pulse Rate: (!) 52 SpO2: 98 %   Anthropometric Measurements Height: 6\' 3"  (1.905 m) Weight: 263 lb (119.3 kg) BMI (Calculated): 32.87 Weight at Last Visit: 265lb Weight Lost Since Last Visit: 2lb Weight Gained Since Last Visit: 0lb Starting Weight: 274lb Total Weight Loss (lbs): 11 lb (4.99 kg)   Body Composition  Body Fat %: 33.6 % Fat Mass (lbs): 88.6 lbs Muscle Mass (lbs): 166.2 lbs Total Body Water  (lbs): 118.6 lbs Visceral Fat Rating : 20   Other Clinical Data Fasting: No Labs: no Today's Visit #: 21 Starting Date: 01/27/22    Objective:   PHYSICAL EXAM: Blood pressure 127/74, pulse (!) 52, temperature 97.8 F (36.6 C), height 6\' 3"  (1.905 m), weight 263 lb (119.3  kg), SpO2 98%. Body mass index is 32.87 kg/m.  General: he is overweight, cooperative and in no acute distress. PSYCH: Has normal mood, affect and thought process.   HEENT: EOMI, sclerae are anicteric. Lungs: Normal breathing effort, no conversational dyspnea. Extremities: Moves * 4 Neurologic: A and O * 3, good insight  DIAGNOSTIC DATA REVIEWED: BMET    Component Value Date/Time   NA 138 09/02/2023 0901   NA 143 06/23/2023 1055   K 4.3 09/02/2023 0901   CL 105 09/02/2023 0901   CO2 23 09/02/2023 0901   GLUCOSE 97 09/02/2023 0901   BUN 25 (H) 09/02/2023 0901   BUN 19 06/23/2023 1055   CREATININE 1.30 (H) 09/02/2023 0901   CREATININE 1.34 (H) 10/31/2020 0946   CALCIUM  9.1 09/02/2023 0901   GFRNONAA 59 (L) 09/02/2023 0901   GFRNONAA 54 (L) 10/31/2020 0946   GFRAA 63 10/31/2020 0946   Lab Results  Component Value Date   HGBA1C 5.6 06/23/2023   HGBA1C 5.7 10/21/2018   Lab Results  Component Value Date   INSULIN  12.2 06/23/2023   INSULIN  12.6 01/27/2022   Lab Results  Component Value Date   TSH 1.450 09/02/2023   CBC    Component Value Date/Time   WBC 6.2 09/02/2023 0901   RBC 5.16 09/02/2023 0901   HGB 15.2 09/02/2023 0901   HGB 15.5 03/17/2023 1048  HCT 45.5 09/02/2023 0901   HCT 45.8 03/17/2023 1048   PLT 246 09/02/2023 0901   PLT 283 03/17/2023 1048   MCV 88.2 09/02/2023 0901   MCV 90 03/17/2023 1048   MCH 29.5 09/02/2023 0901   MCHC 33.4 09/02/2023 0901   RDW 11.9 09/02/2023 0901   RDW 12.2 03/17/2023 1048   Iron Studies    Component Value Date/Time   IRON 86 07/21/2022 0802   TIBC 280 07/21/2022 0802   FERRITIN 228 07/21/2022 0802   IRONPCTSAT 31 07/21/2022 0802   Lipid Panel     Component Value Date/Time   CHOL 161 01/05/2023 1114   TRIG 133 01/05/2023 1114   HDL 43 01/05/2023 1114   CHOLHDL 3.7 01/05/2023 1114   CHOLHDL 6 04/01/2021 0813   VLDL 24.4 04/01/2021 0813   LDLCALC 94 01/05/2023 1114   LDLCALC 131 (H) 10/03/2019 0926    Hepatic Function Panel     Component Value Date/Time   PROT 7.1 06/23/2023 1055   ALBUMIN 4.4 06/23/2023 1055   AST 19 06/23/2023 1055   ALT 24 06/23/2023 1055   ALKPHOS 102 06/23/2023 1055   BILITOT 0.5 06/23/2023 1055      Component Value Date/Time   TSH 1.450 09/02/2023 0901   Nutritional Lab Results  Component Value Date   VD25OH 48.8 06/23/2023   VD25OH 42.9 01/05/2023   VD25OH 43.3 07/21/2022    Attestations:   I, Camryn Mix, acting as a Stage manager for [employer], DO., have compiled all relevant documentation for today's office visit on behalf of Marceil Sensor, DO, while in the presence of [employer], DO.  I have reviewed the above documentation for accuracy and completeness, and I agree with the above. Rae Bugler, D.O.  The 21st Century Cures Act was signed into law in 2016 which includes the topic of electronic health records.  This provides immediate access to information in MyChart.  This includes consultation notes, operative notes, office notes, lab results and pathology reports.  If you have any questions about what you read please let us  know at your next visit so we can discuss your concerns and take corrective action if need be.  We are right here with you.

## 2023-09-08 DIAGNOSIS — R002 Palpitations: Secondary | ICD-10-CM | POA: Diagnosis not present

## 2023-09-20 ENCOUNTER — Telehealth

## 2023-09-27 ENCOUNTER — Ambulatory Visit: Admitting: Nurse Practitioner

## 2023-09-29 ENCOUNTER — Telehealth: Payer: Self-pay

## 2023-09-29 ENCOUNTER — Encounter: Payer: Self-pay | Admitting: Nurse Practitioner

## 2023-09-29 ENCOUNTER — Ambulatory Visit: Attending: Nurse Practitioner | Admitting: Nurse Practitioner

## 2023-09-29 VITALS — BP 120/70 | Ht 75.0 in | Wt 262.0 lb

## 2023-09-29 DIAGNOSIS — I493 Ventricular premature depolarization: Secondary | ICD-10-CM

## 2023-09-29 DIAGNOSIS — Z95818 Presence of other cardiac implants and grafts: Secondary | ICD-10-CM | POA: Diagnosis not present

## 2023-09-29 DIAGNOSIS — E782 Mixed hyperlipidemia: Secondary | ICD-10-CM

## 2023-09-29 DIAGNOSIS — I2510 Atherosclerotic heart disease of native coronary artery without angina pectoris: Secondary | ICD-10-CM

## 2023-09-29 DIAGNOSIS — I491 Atrial premature depolarization: Secondary | ICD-10-CM

## 2023-09-29 DIAGNOSIS — I77819 Aortic ectasia, unspecified site: Secondary | ICD-10-CM

## 2023-09-29 DIAGNOSIS — I10 Essential (primary) hypertension: Secondary | ICD-10-CM

## 2023-09-29 DIAGNOSIS — I7121 Aneurysm of the ascending aorta, without rupture: Secondary | ICD-10-CM

## 2023-09-29 DIAGNOSIS — I480 Paroxysmal atrial fibrillation: Secondary | ICD-10-CM

## 2023-09-29 NOTE — Progress Notes (Signed)
 Virtual Visit via Telephone Note   Because of Jonathan Dyer's co-morbid illnesses, he is at least at moderate risk for complications without adequate follow up.  This format is felt to be most appropriate for this patient at this time.  The patient did not have access to video technology/had technical difficulties with video requiring transitioning to audio format only (telephone).  All issues noted in this document were discussed and addressed.  No physical exam could be performed with this format.  Please refer to the patient's chart for his consent to telehealth for [facility].    Date:  09/29/2023 ID:  Dyer Jonathan, DOB June 28, 1955, MRN [id number] The patient was identified using 2 identifiers. Patient Location: Home Provider Location: Office/Clinic PCP:  Towanda Fret, MD [facility] Providers Cardiologist:  Teddie Favre, MD Electrophysiologist:  Boyce Byes, MD     Evaluation Performed:  Follow-Up Visit  Chief Complaint:  Follow-up for Palpitations  History of Present Illness:    Jonathan Dyer is a 68 y.o. male with a PMH of paroxysmal to persistent A-fib, s/p LAAO with Watchman device implant on February 04, 2023, mixed hyperlipidemia with history of statin myalgias, hypertension, ascending aortic aneurysm, who presents today for telephone follow-up visit regarding palpitations.  Last seen in the office on August 27, 2023 with chief complaint of irregular heartbeats since August 09, 2023.  Reported drinking unsweetened tea recently.  Described what sounded like PVCs/PACs. Denies any chest pain, shortness of breath, palpitations, syncope, presyncope, dizziness, orthopnea, PND, swelling or significant weight changes, acute bleeding, or claudication.  Was remaining very active and riding his bike regularly.  See most recent monitor report noted below.  Today he presents for follow-up via telephone visit.  Continues to note some  palpitations that have reduced in frequency since last office visit.  Tells me he continues to drink unsweetened tea that is caffeinated throughout the day.  Otherwise, he is doing well.  Denies any chest pain, shortness of breath, palpitations, syncope, presyncope, dizziness, orthopnea, PND, swelling or significant weight changes, acute bleeding, or claudication.  He is gearing up for a bike competition later this month and into early June.   SH: He will be participating in a bicycle race in [place]  around the end of May/early June.  ROS:   Please see the history of present illness.    All other systems reviewed and are negative.   Prior CV studies:   The following studies were reviewed today:  Cardiac monitor 08/2023: Predominant rhythm is sinus with heart rate ranging from 40 bpm up to 102 bpm and average heart rate 55 bpm. There were rare PACs including atrial couplets and triplets representing less than 1% total beats. There were rare PVCs including ventricular couplets representing less than 1% total beats. Very limited junctional rhythm noted without bradycardia. Patient triggered episodes tended to correlate with PACs and PVCs.  There were no significant arrhythmias. No pauses or high degree heart block.  CT cardiac 04/2023: IMPRESSION: 1. Successful left atrial appendage occlusion. 2. Absence of peridevice leak. 3. Absence of device-related thrombus. 4. Stable device position without evidence of migration or embolization. 5. 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 Consider repeat imaging study in one year to follow for progression.   TEE 01/2023:  1. Prior to procedure, patient had a patent left atrial appendage. Distal  to the ostium, sufficient measurements for a 27 mm Watchman FLX Pro.   2. Atrial septal  aneursym, lipomatous interatrial septal hypertrophy, and  patient foramen ovale noted. Transeptal puncture with no complications.   3. A 27 mm Watchman FLX Pro  placed. No peri-device leak. No mitral  shoulder. No thrombus. Average compression ~ 19%.   4. No change to pericardial effusion. 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 Successful Watchman placement.   5. Left ventricular ejection fraction, by estimation, is 55 to 60%. The  left ventricle has normal function.   6. Right ventricular systolic function is normal. The right ventricular  size is normal.   7. Left atrial size was mild to moderately dilated. No left atrial/left  atrial appendage thrombus was detected.   8. A small pericardial effusion is present. The pericardial effusion is  posterior to the left ventricle.   9. The mitral valve is abnormal. Mild mitral valve regurgitation. No  evidence of mitral stenosis.  10. The aortic valve is tricuspid. Aortic valve regurgitation is not  visualized. No aortic stenosis is present.  11. Aortic dilatation noted. Aneurysm of the ascending aorta, measuring 45  mm. There is mild dilatation of the aortic root, measuring 41 mm.  12. The inferior vena cava is normal in size with greater than 50%  respiratory variability, suggesting right atrial pressure of 3 mmHg.  13. Evidence of atrial level shunting detected by color flow Doppler.   Lexiscan  07/2022:    Findings are consistent with no ischemia. The study is low risk.   No ST deviation was noted. The ECG was negative for ischemia.  Low risk Duke treadmill score of 9.   LV perfusion is equivocal.  Small, mild intensity, mid to apical inferolateral defect that is predominantly fixed with partial reversibility suggestive of variable soft tissue attenuation artifact.  Importantly, no large ischemic territories are noted.   Left ventricular function is normal. Nuclear stress EF: 53 %.   Low risk study with Duke treadmill score of 9, no exercise-induced arrhythmias, and perfusion imaging most consistent with variable soft tissue attenuation.  No large ischemic  territories are noted.  LVEF 53%.   CT cardiac 07/2022:  IMPRESSION: 1. Fusiform aneurysmal dilatation of the ascending aorta, maximal dimension 4.3 cm. This retrospectively appears stable to 2011 exam recommend annual imaging followup by CTA or MRA. This recommendation follows 2010 ACCF/AHA/AATS/ACR/ASA/SCA/SCAI/SIR/STS/SVM Guidelines for the Diagnosis and Management of Patients with Thoracic Aortic Disease. Circulation. 2010; 121: U981-X914. Aortic aneurysm NOS (ICD10-I71.9) 2. Peri fissural nodule most consistent with intrapulmonary lymph node on the right, needing no further follow-up.   Echo 04/2022:  1. Left ventricular ejection fraction, by estimation, is 55 to 60%. The  left ventricle has normal function. The left ventricle has no regional  wall motion abnormalities. Left ventricular diastolic parameters are  indeterminate. The average left  ventricular global longitudinal strain is -22.7 %. The global longitudinal  strain is normal.   2. Right ventricular systolic function not well visualized but appears to  be mildly reduced. The right ventricular size is mildly enlarged.   3. The mitral valve is grossly normal. Mild mitral valve regurgitation.  No evidence of mitral stenosis.   4. The aortic valve is tricuspid. There is mild calcification of the  aortic valve. Aortic valve regurgitation is not visualized. No aortic  stenosis is present.   5. Aortic dilatation noted. There is mild dilatation of the aortic root,  measuring 44 mm. There is mild dilatation of the ascending aorta,  measuring 44 mm. There is mild dilatation of  the aortic arch, measuring 40 mm.   Labs/Other Tests and Data Reviewed:    EKG:  No ECG reviewed.  Recent Labs: 06/23/2023: ALT 24 09/02/2023: BUN 25; Creatinine, Ser 1.30; Hemoglobin 15.2; Magnesium 2.2; Platelets 246; Potassium 4.3; Sodium 138; TSH 1.450   Recent Lipid Panel Lab Results  Component Value Date/Time   CHOL 161 01/05/2023 11:14 AM    TRIG 133 01/05/2023 11:14 AM   HDL 43 01/05/2023 11:14 AM   CHOLHDL 3.7 01/05/2023 11:14 AM   CHOLHDL 6 04/01/2021 08:13 AM   LDLCALC 94 01/05/2023 11:14 AM   LDLCALC 131 (H) 10/03/2019 09:26 AM    Wt Readings from Last 3 Encounters:  09/29/23 262 lb (118.8 kg)  09/06/23 263 lb (119.3 kg)  08/31/23 269 lb (122 kg)   Objective:    Vital Signs:  BP 120/70   Ht 6\' 3"  (1.905 m)   Wt 262 lb (118.8 kg)   BMI 32.75 kg/m    Due to nature of today's visit, a physical exam was unable to be performed.  ASSESSMENT & PLAN:    Paroxysmal A-fib, s/p LAAO with Watchman device implant on February 04, 2023, PVC's/PAC's Does appear to have some symptoms of PVCs/PACs that he was symptomatic while wearing heart monitor-see full report above.  Noted to have rare burden on monitor.  Due to his underlying heart rate of 55 bpm on average, recommended lifestyle modifications including weaning off caffeine.  He verbalized understanding.  No indication for beta-blocker at this time. He verbalized understanding. Continue current medication regimen. Not on OAC.    Coronary artery calcification Coronary CTA in March 2024 revealed coronary artery calcium  score 5,250. Stable with no anginal symptoms. No indication for ischemic evaluation. Continue current medication regimen.    Mixed HLD LDL 94 12/2022. Continue Repatha . Heart healthy diet and regular cardiovascular exercise encouraged.    HTN BP stable. Discussed to monitor BP at home at least 2 hours after medications and sitting for 5-10 minutes. Continue current medication regimen. Heart healthy diet and regular cardiovascular exercise encouraged.    Ascending aortic aneurysm, aortic dilatation CT imaging 04/2023 revealed mild aortic dilatation, 44 mm.  Was recommended to repeat imaging study in 1 year.  Plan to repeat CT imaging in December 2025 for further evaluation.  Care and ED precautions discussed.     Time:   Today, I have spent 11 minutes with  the patient with telehealth technology discussing the above problems.    I spent a total duration of 20 minutes reviewing prior notes, reviewing outside records including  labs, monitor results, telephone counseling of medical condition, pathophysiology, evaluation, management, and documenting the findings in the note.    Medication Adjustments/Labs and Tests Ordered: Current medicines are reviewed at length with the patient today.  Concerns regarding medicines are outlined above.   Tests Ordered: No orders of the defined types were placed in this encounter.   Medication Changes: No orders of the defined types were placed in this encounter.   Follow Up:  In Person in 4-6 month(s)  Signed, Lasalle Pointer, NP

## 2023-09-29 NOTE — Patient Instructions (Addendum)
 Medication Instructions:  Your physician recommends that you continue on your current medications as directed. Please refer to the Current Medication list given to you today.  Labwork: none  Testing/Procedures: none  Follow-Up: Your physician recommends that you schedule a follow-up appointment in: 4-6 months in person.  Any Other Special Instructions Will Be Listed Below (If Applicable).  If you need a refill on your cardiac medications before your next appointment, please call your pharmacy.

## 2023-09-29 NOTE — Telephone Encounter (Signed)
  Patient Consent for Virtual Visit        [name] has provided verbal consent on 09/29/2023 for a virtual visit (video or telephone).   CONSENT FOR VIRTUAL VISIT FOR:  Jonathan Dyer  By participating in this virtual visit I agree to the following:  I hereby voluntarily request, consent and authorize [facility] and its employed or contracted physicians, physician assistants, nurse practitioners or other licensed health care professionals (the Practitioner), to provide me with telemedicine health care services (the "Services") as deemed necessary by the treating Practitioner. I acknowledge and consent to receive the Services by the Practitioner via telemedicine. I understand that the telemedicine visit will involve communicating with the Practitioner through live audiovisual communication technology and the disclosure of certain medical information by electronic transmission. I acknowledge that I have been given the opportunity to request an in-person assessment or other available alternative prior to the telemedicine visit and am voluntarily participating in the telemedicine visit.  I understand that I have the right to withhold or withdraw my consent to the use of telemedicine in the course of my care at any time, without affecting my right to future care or treatment, and that the Practitioner or I may terminate the telemedicine visit at any time. I understand that I have the right to inspect all information obtained and/or recorded in the course of the telemedicine visit and may receive copies of available information for a reasonable fee.  I understand that some of the potential risks of receiving the Services via telemedicine include:  Delay or interruption in medical evaluation due to technological equipment failure or disruption; Information transmitted may not be sufficient (e.g. poor resolution of images) to allow for appropriate medical decision making by the  Practitioner; and/or  In rare instances, security protocols could fail, causing a breach of personal health information.  Furthermore, I acknowledge that it is my responsibility to provide information about my medical history, conditions and care that is complete and accurate to the best of my ability. I acknowledge that Practitioner's advice, recommendations, and/or decision may be based on factors not within their control, such as incomplete or inaccurate data provided by me or distortions of diagnostic images or specimens that may result from electronic transmissions. I understand that the practice of medicine is not an exact science and that Practitioner makes no warranties or guarantees regarding treatment outcomes. I acknowledge that a copy of this consent can be made available to me via my patient portal [facility] MyChart), or I can request a printed copy by calling the office of [facility].    I understand that my insurance will be billed for this visit.   I have read or had this consent read to me. I understand the contents of this consent, which adequately explains the benefits and risks of the Services being provided via telemedicine.  I have been provided ample opportunity to ask questions regarding this consent and the Services and have had my questions answered to my satisfaction. I give my informed consent for the services to be provided through the use of telemedicine in my medical care

## 2023-09-30 DIAGNOSIS — M25561 Pain in right knee: Secondary | ICD-10-CM | POA: Diagnosis not present

## 2023-10-06 ENCOUNTER — Ambulatory Visit: Payer: Self-pay | Admitting: Emergency Medicine

## 2023-10-06 DIAGNOSIS — G8929 Other chronic pain: Secondary | ICD-10-CM

## 2023-10-06 NOTE — H&P (View-Only) (Signed)
 TOTAL KNEE REVISION ADMISSION H&P  Patient is being admitted for left revision total knee arthroplasty.  Subjective:  Chief Complaint:left knee pain.  HPI: Jonathan Dyer, 68 y.o. 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 The indications for the revision of the total knee arthroplasty are instability of left knee. Onset of symptoms was gradual starting 1 years ago with gradually worsening course since that time.  Prior procedures on the left knee(s) include arthroplasty.  Patient currently rates pain in the left knee(s) at 10 out of 10 with activity. There is night pain, worsening of pain with activity and weight bearing, pain that interferes with activities of daily living, and pain with passive range of motion.   This condition presents safety issues increasing the risk of falls.  There is no current active infection.  Patient Active Problem List   Diagnosis Date Noted   Rectus diastasis 08/31/2023   Presence of Watchman left atrial appendage closure device 02/04/2023   Left ventricular hypertrophy 02/03/2023   Fatigue 02/03/2023   BPH (benign prostatic hyperplasia) 12/29/2022   CKD stage 3a, GFR 45-59 ml/min (HCC) 12/29/2022   Bilateral Lower extremity edema 10/14/2022   BMI 35.0-35.9,adult 10/14/2022   Agatston coronary artery calcium  score greater than 400 08/10/2022   Statin myopathy 08/10/2022   Elevated serum creatinine 07/21/2022   Myalgia 07/21/2022   BMI 34.0-34.9,adult 07/21/2022   Prostate cancer (HCC) 06/16/2022   Essential hypertension 06/16/2022   BMI 33.0-33.9,adult 06/16/2022   Obesity, Beginning BMI 34.25 06/16/2022   Prediabetes 03/03/2022    Other hyperlipidemia 03/03/2022   Vitamin D  deficiency 03/03/2022   Class 1 obesity with serious comorbidity and body mass index (BMI) of 34.0 to 34.9 in adult 03/03/2022   Referred otalgia of right ear 05/23/2021   Presbycusis of both ears 05/23/2021   Pain due to dental caries 05/23/2021   Impacted cerumen of left ear 05/23/2021   Arthritis of carpometacarpal (CMC) joint of left thumb 02/05/2020   Gastroesophageal reflux disease without esophagitis 04/17/2019   Localized, primary osteoarthritis of hand 09/06/2018   Trigger finger of right hand 09/06/2018   Osteoarthritis of carpometacarpal [facility]) joint of thumb 09/06/2018   Renal cyst 07/04/2018   Colonic mass 01/03/2018   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   Screen for colon cancer 12/29/2017   Arthralgia of right foot 08/17/2017   Degeneration of lumbar intervertebral disc 08/04/2017   Osteoarthritis of right knee 07/20/2017   Spinal stenosis 07/20/2017   Hypertensive disorder 01/01/2016   Hx of adenomatous colonic polyps 03/01/2015   Pain in joint, shoulder region 01/26/2012   Impingement syndrome of left shoulder 01/26/2012   Muscle weakness (generalized) 01/26/2012   Hyperlipidemia, mixed 10/11/2009   Obstructive sleep apnea 10/11/2009   PAROXYSMAL ATRIAL FIBRILLATION 10/11/2009   CAD (coronary artery disease), native coronary artery 08/21/2009   Benign essential hypertension 11/09/2008   Past Medical History:  Diagnosis Date   Allergy    Arthritis    Atrial fibrillation (HCC)    Diagnosed 09/2009, on flecainide    Back pain    Bilateral swelling of feet    Bradycardia    Event monitor 2010: HR down to 45, asymptomatic.   Cataract    Coronary atherosclerosis of native coronary artery  Nonobstructive and distal disease by cath 2005.    Essential hypertension    Hyperlipidemia    Joint pain    OSA (obstructive sleep apnea)    Osteoarthritis    Overweight    Prediabetes    Presence of Watchman left atrial appendage closure  device 02/04/2023   Watchman 27mm FLX Pro placed by Dr. Marven Slimmer   Prostate cancer [facility]) 05/2022   following via biopsy   Renal cyst    Ringing in ears    Sleep apnea    Vitamin D  deficiency     Past Surgical History:  Procedure Laterality Date   BIOPSY  01/04/2018   Procedure: BIOPSY;  Surgeon: Alyce Jubilee, MD;  Location: [facility];  Service: Endoscopy;;  ascending colon    CARDIAC CATHETERIZATION  2005   COLONOSCOPY  05/2006   SLF: 3 mm sigmoid: Tubular adenoma removed.   COLONOSCOPY N/A 05/06/2016   Dr. Nolene Baumgarten: two sessile polyps in mid transverse colon and hepatic flexure (tubular adenomas). ext/int hemorrhoids. tortuous left colon.    COLONOSCOPY WITH PROPOFOL  N/A 01/04/2018   Procedure: COLONOSCOPY WITH PROPOFOL ;  Surgeon: Alyce Jubilee, MD;  Location: [facility];  Service: Endoscopy;  Laterality: N/A;  2:30pm   KNEE ARTHROSCOPY     Left anterior cruciate ligament reconstruction     LEFT ATRIAL APPENDAGE OCCLUSION N/A 02/04/2023   Procedure: LEFT ATRIAL APPENDAGE OCCLUSION;  Surgeon: Boyce Byes, MD;  Location: [facility];  Service: Cardiovascular;  Laterality: N/A;   left shoulder surgery     dr . Porfirio Bristol collins    POLYPECTOMY  05/06/2016   Procedure: POLYPECTOMY;  Surgeon: Alyce Jubilee, MD;  Location: [facility];  Service: Endoscopy;;  transverse colon polyp, hepatic flexure polyp,    Right shoulder surgery     ROBOT ASSISTED LAPAROSCOPIC NEPHRECTOMY Left 07/04/2018   Procedure: XI ROBOTIC ASSISTED LAPAROSCOPIC RENAL CYST DECORTICATION;  Surgeon: Marco Severs, MD;  Location: [facility];  Service: Urology;  Laterality: Left;  3 HRSPROCEDURE: ROBOTIC RENAL CYST DECORTICATION   TEE WITHOUT CARDIOVERSION N/A 02/04/2023   Procedure: TRANSESOPHAGEAL ECHOCARDIOGRAM;  Surgeon: Boyce Byes, MD;  Location: [facility] INVASIVE CV LAB;  Service: Cardiovascular;  Laterality: N/A;   TONSILLECTOMY     TOTAL KNEE ARTHROPLASTY      Current Outpatient Medications   Medication Sig Dispense Refill Last Dose/Taking   amLODipine  (NORVASC ) 10 MG tablet TAKE 1 TABLET BY MOUTH DAILY 100 tablet 2    aspirin  EC 81 MG tablet Take 81 mg by mouth daily. NOT STARTED YET, PT WILL START AFTER HIS PROSTATE Bx, see notes in the chart.      cephALEXin  (KEFLEX ) 500 MG capsule Take 1 capsule (500 mg total) by mouth as needed. Take one tablet by mouth, one hour prior to dental cleanings 3 capsule 0    Coenzyme Q10 (CO Q-10) 100 MG CAPS Take 1 tablet by mouth daily.      Evolocumab  (REPATHA  SURECLICK) 140 MG/ML SOAJ Inject 140 mg into the skin every 14 (fourteen) days. 2 mL 11    flecainide  (TAMBOCOR ) 50 MG tablet TAKE 1 TABLET BY MOUTH TWICE  DAILY 200 tablet 2    furosemide  (LASIX ) 40 MG tablet TAKE 1 TABLET BY MOUTH DAILY AS  NEEDED FOR FLUID 90 tablet 1    losartan  (COZAAR ) 100 MG tablet TAKE 1 TABLET BY MOUTH DAILY 100 tablet 2    meloxicam (MOBIC) 7.5 MG tablet Take 7.5 mg by mouth daily.  Multiple Vitamin (MULTIVITAMIN) tablet Take 1 tablet by mouth daily.      omeprazole  (PRILOSEC) 20 MG capsule TAKE 1 CAPSULE BY MOUTH DAILY AS NEEDED FOR ACID REFLUX,  INDIGESTION (Patient taking differently: Take 20 mg by mouth daily as needed.) 90 capsule 1    tamsulosin  (FLOMAX ) 0.4 MG CAPS capsule Take 0.4 mg by mouth 2 (two) times a week. Takes occasionally.      Vitamin D , Ergocalciferol , (DRISDOL ) 1.25 MG (50000 UNIT) CAPS capsule Take 1 capsule (50,000 Units total) by mouth every 7 (seven) days. 13 capsule 0    No current facility-administered medications for this visit.   Allergies  Allergen Reactions   Clindamycin/Lincomycin Swelling and Other (See Comments)    Mycin drug family    Penicillin G Other (See Comments)   Penicillins Other (See Comments)    Has patient had a PCN reaction causing immediate rash, facial/tongue/throat swelling, SOB or lightheadedness with hypotension: unknown Has patient had a PCN reaction causing severe rash involving mucus membranes or skin  necrosis: unknown Has patient had a PCN reaction that required hospitalization unknown Has patient had a PCN reaction occurring within the last 10 years: unknown If all of the above answers are "NO", then may proceed    Pneumococcal 13-Val Conj Vacc Swelling    Patient stated that he has severe swelling in his arm from vaccine and swollen up to his collar bone. Lasted about 2 days and did not feel well.     Social History   Tobacco Use   Smoking status: Never   Smokeless tobacco: Never  Substance Use Topics   Alcohol use: No    Alcohol/week: 0.0 standard drinks of alcohol    Family History  Problem Relation Age of Onset   Diabetes Mother    Pulmonary embolism Mother    Obesity Mother    Cancer Father    Hypertension Father    Lung disease Father    Heart disease Father    Arthritis Sister    Hyperlipidemia Brother    Hypertension Brother    Diabetes Other        Significant family h/o DM   Colon cancer Neg Hx       Review of Systems  Musculoskeletal:  Positive for arthralgias.  All other systems reviewed and are negative.    Objective:  Physical Exam Constitutional:      General: He is not in acute distress.    Appearance: Normal appearance. He is normal weight.  HENT:     Head: Normocephalic and atraumatic.  Eyes:     Extraocular Movements: Extraocular movements intact.     Conjunctiva/sclera: Conjunctivae normal.     Pupils: Pupils are equal, round, and reactive to light.  Cardiovascular:     Rate and Rhythm: Normal rate and regular rhythm.     Pulses: Normal pulses.     Heart sounds: Normal heart sounds.  Pulmonary:     Effort: Pulmonary effort is normal. No respiratory distress.     Breath sounds: Normal breath sounds.  Abdominal:     General: Bowel sounds are normal. There is no distension.     Palpations: Abdomen is soft.     Tenderness: There is no abdominal tenderness.  Musculoskeletal:        General: Tenderness present.     Cervical back:  Normal range of motion and neck supple.     Comments: TTP over medial and lateral joint line, medial worse than lateral.  No calf  tenderness, swelling, or erythema.  Well healed surgical incision, otherwise no overlying lesions of area of chief complaint.  Decreased strength and ROM due to elicited pain.  Pre-operative ROM -5 to 95 degrees.  Dorsiflexion and plantarflexion intact.  Increased laxity to varus and valgus stress.  BLE appear grossly neurovascularly intact.  Gait mildly antalgic.   Lymphadenopathy:     Cervical: No cervical adenopathy.  Skin:    General: Skin is warm and dry.     Capillary Refill: Capillary refill takes less than 2 seconds.     Findings: No erythema or rash.  Neurological:     General: No focal deficit present.     Mental Status: He is alert and oriented to person, place, and time.  Psychiatric:        Mood and Affect: Mood normal.        Behavior: Behavior normal.     Vital signs in last 24 hours: @VSRANGES @  Labs:  Estimated body mass index is 32.75 kg/m as calculated from the following:   Height as of 09/29/23: 6\' 3"  (1.905 m).   Weight as of 09/29/23: 118.8 kg.  Imaging Review Plain radiographs demonstrate total knee arthroplasty components in good position without adverse features.  Minimal slope of tibia noted on lateral view.   The bone quality appears to be fair for age and reported activity level.     Assessment/Plan:  Instability, left knee(s) with failed previous arthroplasty.   The patient history, physical examination, clinical judgment of the provider and imaging studies are consistent with instability of left knee, previous total knee arthroplasty. Revision total knee arthroplasty is deemed medically necessary. The treatment options including medical management, injection therapy, arthroscopy and revision arthroplasty were discussed at length. The risks and benefits of revision total knee arthroplasty were presented and reviewed. The  risks due to aseptic loosening, infection, stiffness, patella tracking problems, thromboembolic complications and other imponderables were discussed. The patient acknowledged the explanation, agreed to proceed with the plan and consent was signed. Patient is being admitted for inpatient treatment for surgery, pain control, PT, OT, prophylactic antibiotics, VTE prophylaxis, progressive ambulation and ADL's and discharge planning.The patient is planning to be discharged home with home health services ([facility]).

## 2023-10-06 NOTE — H&P (Signed)
 TOTAL KNEE REVISION ADMISSION H&P  Patient is being admitted for left revision total knee arthroplasty.  Subjective:  Chief Complaint:left knee pain.  HPI: Jonathan Dyer, 68 y.o. 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 The indications for the revision of the total knee arthroplasty are instability of left knee. Onset of symptoms was gradual starting 1 years ago with gradually worsening course since that time.  Prior procedures on the left knee(s) include arthroplasty.  Patient currently rates pain in the left knee(s) at 10 out of 10 with activity. There is night pain, worsening of pain with activity and weight bearing, pain that interferes with activities of daily living, and pain with passive range of motion.   This condition presents safety issues increasing the risk of falls.  There is no current active infection.  Patient Active Problem List   Diagnosis Date Noted   Rectus diastasis 08/31/2023   Presence of Watchman left atrial appendage closure device 02/04/2023   Left ventricular hypertrophy 02/03/2023   Fatigue 02/03/2023   BPH (benign prostatic hyperplasia) 12/29/2022   CKD stage 3a, GFR 45-59 ml/min (HCC) 12/29/2022   Bilateral Lower extremity edema 10/14/2022   BMI 35.0-35.9,adult 10/14/2022   Agatston coronary artery calcium  score greater than 400 08/10/2022   Statin myopathy 08/10/2022   Elevated serum creatinine 07/21/2022   Myalgia 07/21/2022   BMI 34.0-34.9,adult 07/21/2022   Prostate cancer (HCC) 06/16/2022   Essential hypertension 06/16/2022   BMI 33.0-33.9,adult 06/16/2022   Obesity, Beginning BMI 34.25 06/16/2022   Prediabetes 03/03/2022    Other hyperlipidemia 03/03/2022   Vitamin D  deficiency 03/03/2022   Class 1 obesity with serious comorbidity and body mass index (BMI) of 34.0 to 34.9 in adult 03/03/2022   Referred otalgia of right ear 05/23/2021   Presbycusis of both ears 05/23/2021   Pain due to dental caries 05/23/2021   Impacted cerumen of left ear 05/23/2021   Arthritis of carpometacarpal (CMC) joint of left thumb 02/05/2020   Gastroesophageal reflux disease without esophagitis 04/17/2019   Localized, primary osteoarthritis of hand 09/06/2018   Trigger finger of right hand 09/06/2018   Osteoarthritis of carpometacarpal [facility]) joint of thumb 09/06/2018   Renal cyst 07/04/2018   Colonic mass 01/03/2018   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   Screen for colon cancer 12/29/2017   Arthralgia of right foot 08/17/2017   Degeneration of lumbar intervertebral disc 08/04/2017   Osteoarthritis of right knee 07/20/2017   Spinal stenosis 07/20/2017   Hypertensive disorder 01/01/2016   Hx of adenomatous colonic polyps 03/01/2015   Pain in joint, shoulder region 01/26/2012   Impingement syndrome of left shoulder 01/26/2012   Muscle weakness (generalized) 01/26/2012   Hyperlipidemia, mixed 10/11/2009   Obstructive sleep apnea 10/11/2009   PAROXYSMAL ATRIAL FIBRILLATION 10/11/2009   CAD (coronary artery disease), native coronary artery 08/21/2009   Benign essential hypertension 11/09/2008   Past Medical History:  Diagnosis Date   Allergy    Arthritis    Atrial fibrillation (HCC)    Diagnosed 09/2009, on flecainide    Back pain    Bilateral swelling of feet    Bradycardia    Event monitor 2010: HR down to 45, asymptomatic.   Cataract    Coronary atherosclerosis of native coronary artery  Nonobstructive and distal disease by cath 2005.    Essential hypertension    Hyperlipidemia    Joint pain    OSA (obstructive sleep apnea)    Osteoarthritis    Overweight    Prediabetes    Presence of Watchman left atrial appendage closure  device 02/04/2023   Watchman 27mm FLX Pro placed by Dr. Marven Slimmer   Prostate cancer [facility]) 05/2022   following via biopsy   Renal cyst    Ringing in ears    Sleep apnea    Vitamin D  deficiency     Past Surgical History:  Procedure Laterality Date   BIOPSY  01/04/2018   Procedure: BIOPSY;  Surgeon: Alyce Jubilee, MD;  Location: [facility];  Service: Endoscopy;;  ascending colon    CARDIAC CATHETERIZATION  2005   COLONOSCOPY  05/2006   SLF: 3 mm sigmoid: Tubular adenoma removed.   COLONOSCOPY N/A 05/06/2016   Dr. Nolene Baumgarten: two sessile polyps in mid transverse colon and hepatic flexure (tubular adenomas). ext/int hemorrhoids. tortuous left colon.    COLONOSCOPY WITH PROPOFOL  N/A 01/04/2018   Procedure: COLONOSCOPY WITH PROPOFOL ;  Surgeon: Alyce Jubilee, MD;  Location: [facility];  Service: Endoscopy;  Laterality: N/A;  2:30pm   KNEE ARTHROSCOPY     Left anterior cruciate ligament reconstruction     LEFT ATRIAL APPENDAGE OCCLUSION N/A 02/04/2023   Procedure: LEFT ATRIAL APPENDAGE OCCLUSION;  Surgeon: Boyce Byes, MD;  Location: [facility];  Service: Cardiovascular;  Laterality: N/A;   left shoulder surgery     dr . Porfirio Bristol collins    POLYPECTOMY  05/06/2016   Procedure: POLYPECTOMY;  Surgeon: Alyce Jubilee, MD;  Location: [facility];  Service: Endoscopy;;  transverse colon polyp, hepatic flexure polyp,    Right shoulder surgery     ROBOT ASSISTED LAPAROSCOPIC NEPHRECTOMY Left 07/04/2018   Procedure: XI ROBOTIC ASSISTED LAPAROSCOPIC RENAL CYST DECORTICATION;  Surgeon: Marco Severs, MD;  Location: [facility];  Service: Urology;  Laterality: Left;  3 HRSPROCEDURE: ROBOTIC RENAL CYST DECORTICATION   TEE WITHOUT CARDIOVERSION N/A 02/04/2023   Procedure: TRANSESOPHAGEAL ECHOCARDIOGRAM;  Surgeon: Boyce Byes, MD;  Location: [facility] INVASIVE CV LAB;  Service: Cardiovascular;  Laterality: N/A;   TONSILLECTOMY     TOTAL KNEE ARTHROPLASTY      Current Outpatient Medications   Medication Sig Dispense Refill Last Dose/Taking   amLODipine  (NORVASC ) 10 MG tablet TAKE 1 TABLET BY MOUTH DAILY 100 tablet 2    aspirin  EC 81 MG tablet Take 81 mg by mouth daily. NOT STARTED YET, PT WILL START AFTER HIS PROSTATE Bx, see notes in the chart.      cephALEXin  (KEFLEX ) 500 MG capsule Take 1 capsule (500 mg total) by mouth as needed. Take one tablet by mouth, one hour prior to dental cleanings 3 capsule 0    Coenzyme Q10 (CO Q-10) 100 MG CAPS Take 1 tablet by mouth daily.      Evolocumab  (REPATHA  SURECLICK) 140 MG/ML SOAJ Inject 140 mg into the skin every 14 (fourteen) days. 2 mL 11    flecainide  (TAMBOCOR ) 50 MG tablet TAKE 1 TABLET BY MOUTH TWICE  DAILY 200 tablet 2    furosemide  (LASIX ) 40 MG tablet TAKE 1 TABLET BY MOUTH DAILY AS  NEEDED FOR FLUID 90 tablet 1    losartan  (COZAAR ) 100 MG tablet TAKE 1 TABLET BY MOUTH DAILY 100 tablet 2    meloxicam (MOBIC) 7.5 MG tablet Take 7.5 mg by mouth daily.  Multiple Vitamin (MULTIVITAMIN) tablet Take 1 tablet by mouth daily.      omeprazole  (PRILOSEC) 20 MG capsule TAKE 1 CAPSULE BY MOUTH DAILY AS NEEDED FOR ACID REFLUX,  INDIGESTION (Patient taking differently: Take 20 mg by mouth daily as needed.) 90 capsule 1    tamsulosin  (FLOMAX ) 0.4 MG CAPS capsule Take 0.4 mg by mouth 2 (two) times a week. Takes occasionally.      Vitamin D , Ergocalciferol , (DRISDOL ) 1.25 MG (50000 UNIT) CAPS capsule Take 1 capsule (50,000 Units total) by mouth every 7 (seven) days. 13 capsule 0    No current facility-administered medications for this visit.   Allergies  Allergen Reactions   Clindamycin/Lincomycin Swelling and Other (See Comments)    Mycin drug family    Penicillin G Other (See Comments)   Penicillins Other (See Comments)    Has patient had a PCN reaction causing immediate rash, facial/tongue/throat swelling, SOB or lightheadedness with hypotension: unknown Has patient had a PCN reaction causing severe rash involving mucus membranes or skin  necrosis: unknown Has patient had a PCN reaction that required hospitalization unknown Has patient had a PCN reaction occurring within the last 10 years: unknown If all of the above answers are "NO", then may proceed    Pneumococcal 13-Val Conj Vacc Swelling    Patient stated that he has severe swelling in his arm from vaccine and swollen up to his collar bone. Lasted about 2 days and did not feel well.     Social History   Tobacco Use   Smoking status: Never   Smokeless tobacco: Never  Substance Use Topics   Alcohol use: No    Alcohol/week: 0.0 standard drinks of alcohol    Family History  Problem Relation Age of Onset   Diabetes Mother    Pulmonary embolism Mother    Obesity Mother    Cancer Father    Hypertension Father    Lung disease Father    Heart disease Father    Arthritis Sister    Hyperlipidemia Brother    Hypertension Brother    Diabetes Other        Significant family h/o DM   Colon cancer Neg Hx       Review of Systems  Musculoskeletal:  Positive for arthralgias.  All other systems reviewed and are negative.    Objective:  Physical Exam Constitutional:      General: He is not in acute distress.    Appearance: Normal appearance. He is normal weight.  HENT:     Head: Normocephalic and atraumatic.  Eyes:     Extraocular Movements: Extraocular movements intact.     Conjunctiva/sclera: Conjunctivae normal.     Pupils: Pupils are equal, round, and reactive to light.  Cardiovascular:     Rate and Rhythm: Normal rate and regular rhythm.     Pulses: Normal pulses.     Heart sounds: Normal heart sounds.  Pulmonary:     Effort: Pulmonary effort is normal. No respiratory distress.     Breath sounds: Normal breath sounds.  Abdominal:     General: Bowel sounds are normal. There is no distension.     Palpations: Abdomen is soft.     Tenderness: There is no abdominal tenderness.  Musculoskeletal:        General: Tenderness present.     Cervical back:  Normal range of motion and neck supple.     Comments: TTP over medial and lateral joint line, medial worse than lateral.  No calf  tenderness, swelling, or erythema.  Well healed surgical incision, otherwise no overlying lesions of area of chief complaint.  Decreased strength and ROM due to elicited pain.  Pre-operative ROM -5 to 95 degrees.  Dorsiflexion and plantarflexion intact.  Increased laxity to varus and valgus stress.  BLE appear grossly neurovascularly intact.  Gait mildly antalgic.   Lymphadenopathy:     Cervical: No cervical adenopathy.  Skin:    General: Skin is warm and dry.     Capillary Refill: Capillary refill takes less than 2 seconds.     Findings: No erythema or rash.  Neurological:     General: No focal deficit present.     Mental Status: He is alert and oriented to person, place, and time.  Psychiatric:        Mood and Affect: Mood normal.        Behavior: Behavior normal.     Vital signs in last 24 hours: @VSRANGES @  Labs:  Estimated body mass index is 32.75 kg/m as calculated from the following:   Height as of 09/29/23: 6\' 3"  (1.905 m).   Weight as of 09/29/23: 118.8 kg.  Imaging Review Plain radiographs demonstrate total knee arthroplasty components in good position without adverse features.  Minimal slope of tibia noted on lateral view.   The bone quality appears to be fair for age and reported activity level.     Assessment/Plan:  Instability, left knee(s) with failed previous arthroplasty.   The patient history, physical examination, clinical judgment of the provider and imaging studies are consistent with instability of left knee, previous total knee arthroplasty. Revision total knee arthroplasty is deemed medically necessary. The treatment options including medical management, injection therapy, arthroscopy and revision arthroplasty were discussed at length. The risks and benefits of revision total knee arthroplasty were presented and reviewed. The  risks due to aseptic loosening, infection, stiffness, patella tracking problems, thromboembolic complications and other imponderables were discussed. The patient acknowledged the explanation, agreed to proceed with the plan and consent was signed. Patient is being admitted for inpatient treatment for surgery, pain control, PT, OT, prophylactic antibiotics, VTE prophylaxis, progressive ambulation and ADL's and discharge planning.The patient is planning to be discharged home with home health services ([facility]).

## 2023-10-09 ENCOUNTER — Other Ambulatory Visit: Payer: Self-pay | Admitting: Cardiology

## 2023-10-13 ENCOUNTER — Encounter (INDEPENDENT_AMBULATORY_CARE_PROVIDER_SITE_OTHER): Payer: Self-pay | Admitting: Adult Health

## 2023-10-13 ENCOUNTER — Ambulatory Visit (INDEPENDENT_AMBULATORY_CARE_PROVIDER_SITE_OTHER): Admitting: Adult Health

## 2023-10-13 VITALS — BP 107/67 | HR 59 | Temp 98.2°F | Ht 75.0 in | Wt 265.0 lb

## 2023-10-13 DIAGNOSIS — E782 Mixed hyperlipidemia: Secondary | ICD-10-CM | POA: Diagnosis not present

## 2023-10-13 DIAGNOSIS — E669 Obesity, unspecified: Secondary | ICD-10-CM

## 2023-10-13 DIAGNOSIS — I129 Hypertensive chronic kidney disease with stage 1 through stage 4 chronic kidney disease, or unspecified chronic kidney disease: Secondary | ICD-10-CM | POA: Diagnosis not present

## 2023-10-13 DIAGNOSIS — Z6832 Body mass index (BMI) 32.0-32.9, adult: Secondary | ICD-10-CM

## 2023-10-13 DIAGNOSIS — E559 Vitamin D deficiency, unspecified: Secondary | ICD-10-CM | POA: Diagnosis not present

## 2023-10-13 DIAGNOSIS — N1831 Chronic kidney disease, stage 3a: Secondary | ICD-10-CM | POA: Diagnosis not present

## 2023-10-13 DIAGNOSIS — I10 Essential (primary) hypertension: Secondary | ICD-10-CM

## 2023-10-13 DIAGNOSIS — Z6833 Body mass index (BMI) 33.0-33.9, adult: Secondary | ICD-10-CM

## 2023-10-13 MED ORDER — VITAMIN D (ERGOCALCIFEROL) 1.25 MG (50000 UNIT) PO CAPS
50000.0000 [IU] | ORAL_CAPSULE | ORAL | 0 refills | Status: DC
Start: 2023-10-13 — End: 2024-03-06

## 2023-10-13 NOTE — Patient Instructions (Signed)
 SURGICAL WAITING ROOM VISITATION  Patients having surgery or a procedure may have no more than 2 support people in the waiting area - these visitors may rotate.    Children under the age of 37 must have an adult with them who is not the patient.  Visitors with respiratory illnesses are discouraged from visiting and should remain at home.  If the patient needs to stay at the hospital during part of their recovery, the visitor guidelines for inpatient rooms apply. Pre-op nurse will coordinate an appropriate time for 1 support person to accompany patient in pre-op.  This support person may not rotate.    Please refer to the [facility] website for the visitor guidelines for Inpatients (after your surgery is over and you are in a regular room).       Your procedure is scheduled on: 10-27-23    Report to [facility] Main Entrance    Report to admitting at       0630   AM   Call this number if you have problems the morning of surgery [phone]   Do not eat food :After Midnight.   After Midnight you may have the following liquids until _0530_____ AM/  DAY OF SURGERY  then nothing by mouth  Water  Non-Citrus Juices (without pulp, NO RED-Apple, White grape, White cranberry) Black Coffee (NO MILK/CREAM OR CREAMERS, sugar ok)  Clear Tea (NO MILK/CREAM OR CREAMERS, sugar ok) regular and decaf                             Plain Jell-O (NO RED)                                           Fruit ices (not with fruit pulp, NO RED)                                     Popsicles (NO RED)                                                               Sports drinks like Gatorade (NO RED)                     The day of surgery:  Drink ONE (1) Pre-Surgery  G2 BY    0530  AM the morning of surgery. Drink in one sitting. Do not sip.  This drink was given to you during your hospital  pre-op appointment visit. Nothing else to drink after completing the  Pre-Surgery G2.          If you have  questions, please contact your surgeon's office.   FOLLOW  ANY ADDITIONAL PRE OP INSTRUCTIONS YOU RECEIVED FROM YOUR SURGEON'S OFFICE!!!     Oral Hygiene is also important to reduce your risk of infection.                                    Remember - BRUSH YOUR TEETH  THE MORNING OF SURGERY WITH YOUR REGULAR TOOTHPASTE  DENTURES WILL BE REMOVED PRIOR TO SURGERY PLEASE DO NOT APPLY "Poly grip" OR ADHESIVES!!!   Do NOT smoke after Midnight   Stop all vitamins and herbal supplements 7 days before surgery.   Take these medicines the morning of surgery with A SIP OF WATER : omeprazole  and Tamsulosin  if needed, flecainide , amlodipine     Bring CPAP mask and tubing day of surgery.                              You may not have any metal on your body including hair pins, jewelry, and body piercing             Do not wear lotions, powders,/cologne, or deodorant  .               Men may shave face and neck.   Do not bring valuables to the hospital. [facility] IS NOT             RESPONSIBLE   FOR VALUABLES.   Contacts, glasses, dentures or bridgework may not be worn into surgery.   Bring small overnight bag day of surgery.   DO NOT BRING YOUR HOME MEDICATIONS TO THE HOSPITAL. PHARMACY WILL DISPENSE MEDICATIONS LISTED ON YOUR MEDICATION LIST TO YOU DURING YOUR ADMISSION IN THE HOSPITAL!    Patients discharged on the day of surgery will not be allowed to drive home.  Someone NEEDS to stay with you for the first 24 hours after anesthesia.   Special Instructions: Bring a copy of your healthcare power of attorney and living will documents the day of surgery if you haven't scanned them before.              Please read over the following fact sheets you were given: IF YOU HAVE QUESTIONS ABOUT YOUR PRE-OP INSTRUCTIONS PLEASE CALL [phone]   . If you test positive for Covid or have been in contact with anyone that has tested positive in the last 10 days please notify you surgeon.       Pre-operative 5 CHG Bath Instructions   You can play a key role in reducing the risk of infection after surgery. Your skin needs to be as free of germs as possible. You can reduce the number of germs on your skin by washing with CHG (chlorhexidine  gluconate) soap before surgery. CHG is an antiseptic soap that kills germs and continues to kill germs even after washing.   DO NOT use if you have an allergy to chlorhexidine /CHG or antibacterial soaps. If your skin becomes reddened or irritated, stop using the CHG and notify one of our RNs at [phone].   Please shower with the CHG soap starting 4 days before surgery using the following schedule:     Please keep in mind the following:  DO NOT shave, including legs and underarms, starting the day of your first shower.   You may shave your face at any point before/day of surgery.  Place clean sheets on your bed the day you start using CHG soap. Use a clean washcloth (not used since being washed) for each shower. DO NOT sleep with pets once you start using the CHG.   CHG Shower Instructions:  If you choose to wash your hair and private area, wash first with your normal shampoo/soap.  After you use shampoo/soap, rinse your hair and body thoroughly to remove shampoo/soap residue.  Turn  the water  OFF and apply about 3 tablespoons (45 ml) of CHG soap to a CLEAN washcloth.  Apply CHG soap ONLY FROM YOUR NECK DOWN TO YOUR TOES (washing for 3-5 minutes)  DO NOT use CHG soap on face, private areas, open wounds, or sores.  Pay special attention to the area where your surgery is being performed.  If you are having back surgery, having someone wash your back for you may be helpful. Wait 2 minutes after CHG soap is applied, then you may rinse off the CHG soap.  Pat dry with a clean towel  Put on clean clothes/pajamas   If you choose to wear lotion, please use ONLY the CHG-compatible lotions on the back of this paper.     Additional instructions for  the day of surgery: DO NOT APPLY any lotions, deodorants, cologne, or perfumes.   Put on clean/comfortable clothes.  Brush your teeth.  Ask your nurse before applying any prescription medications to the skin.      CHG Compatible Lotions   Aveeno Moisturizing lotion  Cetaphil Moisturizing Cream  Cetaphil Moisturizing Lotion  Clairol Herbal Essence Moisturizing Lotion, Dry Skin  Clairol Herbal Essence Moisturizing Lotion, Extra Dry Skin  Clairol Herbal Essence Moisturizing Lotion, Normal Skin  Curel Age Defying Therapeutic Moisturizing Lotion with Alpha Hydroxy  Curel Extreme Care Body Lotion  Curel Soothing Hands Moisturizing Hand Lotion  Curel Therapeutic Moisturizing Cream, Fragrance-Free  Curel Therapeutic Moisturizing Lotion, Fragrance-Free  Curel Therapeutic Moisturizing Lotion, Original Formula  Eucerin Daily Replenishing Lotion  Eucerin Dry Skin Therapy Plus Alpha Hydroxy Crme  Eucerin Dry Skin Therapy Plus Alpha Hydroxy Lotion  Eucerin Original Crme  Eucerin Original Lotion  Eucerin Plus Crme Eucerin Plus Lotion  Eucerin TriLipid Replenishing Lotion  Keri Anti-Bacterial Hand Lotion  Keri Deep Conditioning Original Lotion Dry Skin Formula Softly Scented  Keri Deep Conditioning Original Lotion, Fragrance Free Sensitive Skin Formula  Keri Lotion Fast Absorbing Fragrance Free Sensitive Skin Formula  Keri Lotion Fast Absorbing Softly Scented Dry Skin Formula  Keri Original Lotion  Keri Skin Renewal Lotion Keri Silky Smooth Lotion  Keri Silky Smooth Sensitive Skin Lotion  Nivea Body Creamy Conditioning Oil  Nivea Body Extra Enriched Teacher, adult education Moisturizing Lotion Nivea Crme  Nivea Skin Firming Lotion  NutraDerm 30 Skin Lotion  NutraDerm Skin Lotion  NutraDerm Therapeutic Skin Cream  NutraDerm Therapeutic Skin Lotion  ProShield Protective Hand Cream  WHAT IS A BLOOD TRANSFUSION? Blood Transfusion Information  A  transfusion is the replacement of blood or some of its parts. Blood is made up of multiple cells which provide different functions. Red blood cells carry oxygen and are used for blood loss replacement. White blood cells fight against infection. Platelets control bleeding. Plasma helps clot blood. Other blood products are available for specialized needs, such as hemophilia or other clotting disorders. BEFORE THE TRANSFUSION  Who gives blood for transfusions?  Healthy volunteers who are fully evaluated to make sure their blood is safe. This is blood bank blood. Transfusion therapy is the safest it has ever been in the practice of medicine. Before blood is taken from a donor, a complete history is taken to make sure that person has no history of diseases nor engages in risky social behavior (examples are intravenous drug use or sexual activity with multiple partners). The donor's travel history is screened to minimize risk of transmitting infections, such as malaria. The donated blood is tested for signs of infectious diseases,  such as HIV and hepatitis. The blood is then tested to be sure it is compatible with you in order to minimize the chance of a transfusion reaction. If you or a relative donates blood, this is often done in anticipation of surgery and is not appropriate for emergency situations. It takes many days to process the donated blood. RISKS AND COMPLICATIONS Although transfusion therapy is very safe and saves many lives, the main dangers of transfusion include:  Getting an infectious disease. Developing a transfusion reaction. This is an allergic reaction to something in the blood you were given. Every precaution is taken to prevent this. The decision to have a blood transfusion has been considered carefully by your caregiver before blood is given. Blood is not given unless the benefits outweigh the risks. AFTER THE TRANSFUSION Right after receiving a blood transfusion, you will usually  feel much better and more energetic. This is especially true if your red blood cells have gotten low (anemic). The transfusion raises the level of the red blood cells which carry oxygen, and this usually causes an energy increase. The nurse administering the transfusion will monitor you carefully for complications. HOME CARE INSTRUCTIONS  No special instructions are needed after a transfusion. You may find your energy is better. Speak with your caregiver about any limitations on activity for underlying diseases you may have. SEEK MEDICAL CARE IF:  Your condition is not improving after your transfusion. You develop redness or irritation at the intravenous (IV) site. SEEK IMMEDIATE MEDICAL CARE IF:  Any of the following symptoms occur over the next 12 hours: Shaking chills. You have a temperature by mouth above 102 F (38.9 C), not controlled by medicine. Chest, back, or muscle pain. People around you feel you are not acting correctly or are confused. Shortness of breath or difficulty breathing. Dizziness and fainting. You get a rash or develop hives. You have a decrease in urine output. Your urine turns a dark color or changes to pink, red, or brown. Any of the following symptoms occur over the next 10 days: You have a temperature by mouth above 102 F (38.9 C), not controlled by medicine. Shortness of breath. Weakness after normal activity. The white part of the eye turns yellow (jaundice). You have a decrease in the amount of urine or are urinating less often. Your urine turns a dark color or changes to pink, red, or brown. Document Released: 04/24/2000 Document Revised: 07/20/2011 Document Reviewed: 12/12/2007 [employer] Patient Information 2014 [place], [place].  _______________________________________________________________________  Incentive Spirometer  An incentive spirometer is a tool that can help keep your lungs clear and active. This tool measures how well you are filling  your lungs with each breath. Taking long deep breaths may help reverse or decrease the chance of developing breathing (pulmonary) problems (especially infection) following: A long period of time when you are unable to move or be active. BEFORE THE PROCEDURE  If the spirometer includes an indicator to show your best effort, your nurse or respiratory therapist will set it to a desired goal. If possible, sit up straight or lean slightly forward. Try not to slouch. Hold the incentive spirometer in an upright position. INSTRUCTIONS FOR USE  Sit on the edge of your bed if possible, or sit up as far as you can in bed or on a chair. Hold the incentive spirometer in an upright position. Breathe out normally. Place the mouthpiece in your mouth and seal your lips tightly around it. Breathe in slowly and as deeply as  possible, raising the piston or the ball toward the top of the column. Hold your breath for 3-5 seconds or for as long as possible. Allow the piston or ball to fall to the bottom of the column. Remove the mouthpiece from your mouth and breathe out normally. Rest for a few seconds and repeat Steps 1 through 7 at least 10 times every 1-2 hours when you are awake. Take your time and take a few normal breaths between deep breaths. The spirometer may include an indicator to show your best effort. Use the indicator as a goal to work toward during each repetition. After each set of 10 deep breaths, practice coughing to be sure your lungs are clear. If you have an incision (the cut made at the time of surgery), support your incision when coughing by placing a pillow or rolled up towels firmly against it. Once you are able to get out of bed, walk around indoors and cough well. You may stop using the incentive spirometer when instructed by your caregiver.  RISKS AND COMPLICATIONS Take your time so you do not get dizzy or light-headed. If you are in pain, you may need to take or ask for pain medication  before doing incentive spirometry. It is harder to take a deep breath if you are having pain. AFTER USE Rest and breathe slowly and easily. It can be helpful to keep track of a log of your progress. Your caregiver can provide you with a simple table to help with this. If you are using the spirometer at home, follow these instructions: SEEK MEDICAL CARE IF:  You are having difficultly using the spirometer. You have trouble using the spirometer as often as instructed. Your pain medication is not giving enough relief while using the spirometer. You develop fever of 100.5 F (38.1 C) or higher. SEEK IMMEDIATE MEDICAL CARE IF:  You cough up bloody sputum that had not been present before. You develop fever of 102 F (38.9 C) or greater. You develop worsening pain at or near the incision site. MAKE SURE YOU:  Understand these instructions. Will watch your condition. Will get help right away if you are not doing well or get worse. Document Released: 09/07/2006 Document Revised: 07/20/2011 Document Reviewed: 11/08/2006 [facility] Patient Information 2014 [place], [place].   ________________________________________________________________________

## 2023-10-13 NOTE — Progress Notes (Signed)
 WEIGHT SUMMARY AND BIOMETRICS  Vitals Temp: 98.2 F (36.8 C) BP: 107/67 Pulse Rate: (!) 59 SpO2: 96 %   Anthropometric Measurements Height: 6\' 3"  (1.905 m) Weight: 265 lb (120.2 kg) BMI (Calculated): 33.12 Weight at Last Visit: 263 lb Weight Lost Since Last Visit: 0 Weight Gained Since Last Visit: 2 lb Starting Weight: 274 lb Total Weight Loss (lbs): 9 lb (4.082 kg)   Body Composition  Body Fat %: 33.9 % Fat Mass (lbs): 90 lbs Muscle Mass (lbs): 167 lbs Total Body Water  (lbs): 121 lbs Visceral Fat Rating : 21   Other Clinical Data Fasting: no Labs: no Today's Visit #: 22 Starting Date: 01/27/22    Chief Complaint:   OBESITY Jonathan Dyer is here to discuss his progress with his obesity treatment plan.  He is on the keeping a food journal and adhering to recommended goals of 1300-1600 calories and 120g+ protein and states he is following his eating plan approximately 50 % of the time.  He states he is exercising stationary/road biking most days of the week.  Interim History:  He participated in 73 mile road race last weekend in [place] . He enjoys stationary biking, road biking, and gravel riding. He has stopped mountain for the time being, re: worsening L knee pain 10/27/2023 Procedure Laterality Anesthesia  IRRIGATION AND DEBRIDEMENT KNEE WITH POLY EXCHANGE Right Spinal   Reviewed Bioimpedance results with pt: Muscle Mass: +0.8  Adipose Mass: +1.4 lbs  When he tracks intake, he routinely consumes 1100-1200 calories with 100-145 g protein/day  Of Note- L TKA > 15 years ago  Subjective:   1. Hyperlipidemia, mixed Lipid Panel     Component Value Date/Time   CHOL 161 01/05/2023 1114   TRIG 133 01/05/2023 1114   HDL 43 01/05/2023 1114   CHOLHDL 3.7 01/05/2023 1114   CHOLHDL 6 04/01/2021 0813   VLDL 24.4 04/01/2021 0813   LDLCALC 94 01/05/2023 1114   LDLCALC 131 (H) 10/03/2019 0926   LABVLDL 24 01/05/2023 1114    He is on Repatha  injections  Q14D  2. Vitamin D  deficiency  Latest Reference Range & Units 01/05/23 11:14 06/23/23 10:55  Vitamin D , 25-Hydroxy 30.0 - 100.0 ng/mL 42.9 48.8   He is on weekly Ergocalciferol - denies N/V/Muscle Weakness  3. Essential hypertension BP at goal at OV He denies CP when biking  4. CKD stage 3a, GFR 45-59 ml/min (HCC)  Latest Reference Range & Units 03/17/23 10:48 06/23/23 10:55 09/02/23 09:01  Creatinine 0.61 - 1.24 mg/dL 5.78 4.69 (H) 6.29 (H)  (H): Data is abnormally high  Latest Reference Range & Units 09/02/23 09:01  GFR, Estimated >60 mL/min 59 (L)  (L): Data is abnormally low  Latest Reference Range & Units 03/17/23 10:48 06/23/23 10:55  eGFR >59 mL/min/1.73 62 53 (L)  (L): Data is abnormally low  He is on Losartamn 100mg  daily CKD stable  Assessment/Plan:   1. Hyperlipidemia, mixed Continue healthy eating and regular exercise Continue Repatha  as directed  2. Vitamin D  deficiency Refill - Vitamin D , Ergocalciferol , (DRISDOL ) 1.25 MG (50000 UNIT) CAPS capsule; Take 1 capsule (50,000 Units total) by mouth every 7 (seven) days.  Dispense: 13 capsule; Refill: 0  3. Essential hypertension Continue healthy eating and regular exercise  4. CKD stage 3a, GFR 45-59 ml/min (HCC) Continue healthy eating and regular exercise Avoid Nephrotoxic substances  5. BMI 32.0-32.9,adult-current bmi 33.2 (Primary)  Jonathan Dyer is currently in the action stage of change. As such, his goal is to continue with  weight loss efforts. He has agreed to keeping a food journal and adhering to recommended goals of 1300-1400 calories and 120g+ protein.   Exercise goals: Older adults should follow the adult guidelines. When older adults cannot meet the adult guidelines, they should be as physically active as their abilities and conditions will allow.  Older adults should do exercises that maintain or improve balance if they are at risk of falling.  Older adults should determine their level of effort for  physical activity relative to their level of fitness.  Older adults with chronic conditions should understand whether and how their conditions affect their ability to do regular physical activity safely.  Behavioral modification strategies: increasing lean protein intake, decreasing simple carbohydrates, increasing vegetables, increasing water  intake, no skipping meals, meal planning and cooking strategies, keeping healthy foods in the home, ways to avoid boredom eating, and planning for success.  Jonathan Dyer has agreed to follow-up with our clinic in 4 weeks. He was informed of the importance of frequent follow-up visits to maximize his success with intensive lifestyle modifications for his multiple health conditions.   Objective:   Blood pressure 107/67, pulse (!) 59, temperature 98.2 F (36.8 C), height 6\' 3"  (1.905 m), weight 265 lb (120.2 kg), SpO2 96%. Body mass index is 33.12 kg/m.  General: Cooperative, alert, well developed, in no acute distress. HEENT: Conjunctivae and lids unremarkable. Cardiovascular: Regular rhythm.  Lungs: Normal work of breathing. Neurologic: No focal deficits.   Lab Results  Component Value Date   CREATININE 1.30 (H) 09/02/2023   BUN 25 (H) 09/02/2023   NA 138 09/02/2023   K 4.3 09/02/2023   CL 105 09/02/2023   CO2 23 09/02/2023   Lab Results  Component Value Date   ALT 24 06/23/2023   AST 19 06/23/2023   ALKPHOS 102 06/23/2023   BILITOT 0.5 06/23/2023   Lab Results  Component Value Date   HGBA1C 5.6 06/23/2023   HGBA1C 5.5 01/05/2023   HGBA1C 5.7 (H) 07/21/2022   HGBA1C 6.0 (H) 01/27/2022   HGBA1C 5.5 10/03/2019   Lab Results  Component Value Date   INSULIN  12.2 06/23/2023   INSULIN  9.1 01/05/2023   INSULIN  11.5 07/21/2022   INSULIN  12.6 01/27/2022   Lab Results  Component Value Date   TSH 1.450 09/02/2023   Lab Results  Component Value Date   CHOL 161 01/05/2023   HDL 43 01/05/2023   LDLCALC 94 01/05/2023   TRIG 133 01/05/2023    CHOLHDL 3.7 01/05/2023   Lab Results  Component Value Date   VD25OH 48.8 06/23/2023   VD25OH 42.9 01/05/2023   VD25OH 43.3 07/21/2022   Lab Results  Component Value Date   WBC 6.2 09/02/2023   HGB 15.2 09/02/2023   HCT 45.5 09/02/2023   MCV 88.2 09/02/2023   PLT 246 09/02/2023   Lab Results  Component Value Date   IRON 86 07/21/2022   TIBC 280 07/21/2022   FERRITIN 228 07/21/2022   Attestation Statements:   Reviewed by clinician on day of visit: allergies, medications, problem list, medical history, surgical history, family history, social history, and previous encounter notes.  I have reviewed the above documentation for accuracy and completeness, and I agree with the above. -  Jaysun Wessels d. April Colter, NP-C

## 2023-10-13 NOTE — Progress Notes (Signed)
 PCP - Venetta Gill Cardiologist - Natasha Bailiff / televisit 09-29-23 Lasalle Pointer, NP EP- Harvie Liner, MD  PPM/ICD -  Device Orders -  Rep Notified -   Chest x-ray - 02-23-23 epic EKG - 08-27-23 epic Stress Test - 07-2022 ECHO - TEE 02-04-23 epic Cardiac Cath -  Long term monitor 09-08-23 epic CT cardiac morph/pulm vein- 04-30-23 epic  Sleep Study -  CPAP -   Fasting Blood Sugar -  Checks Blood Sugar _____ times a day  Blood Thinner Instructions: Aspirin  Instructions: 81 mg asa  ERAS Protcol -Y PRE-SURGERY G2-    COVID vaccine -yes  Activity-- did a 26 mile mile bicycle race 10-09-23 no CP or SOB  Anesthesia review: Presence of Watchman appendage closure device, pre-DM, CAD, HTN, OSA, A-fib , mild aortic dilation,CKD, Difficult airway noted in epic  Patient denies shortness of breath, fever, cough and chest pain at PAT appointment   All instructions explained to the patient, with a verbal understanding of the material. Patient agrees to go over the instructions while at home for a better understanding. Patient also instructed to self quarantine after being tested for COVID-19. The opportunity to ask questions was provided.

## 2023-10-14 ENCOUNTER — Other Ambulatory Visit: Payer: Self-pay

## 2023-10-14 ENCOUNTER — Encounter (HOSPITAL_COMMUNITY)
Admission: RE | Admit: 2023-10-14 | Discharge: 2023-10-14 | Disposition: A | Discharge status: Home / Self Care | Source: Ambulatory Visit | Attending: Orthopedic Surgery | Admitting: Orthopedic Surgery

## 2023-10-14 ENCOUNTER — Ambulatory Visit: Payer: Self-pay | Admitting: Emergency Medicine

## 2023-10-14 ENCOUNTER — Encounter (HOSPITAL_COMMUNITY): Payer: Self-pay

## 2023-10-14 VITALS — BP 147/78 | HR 52 | Temp 97.9°F | Resp 18 | Ht 75.0 in | Wt 265.0 lb

## 2023-10-14 DIAGNOSIS — M25562 Pain in left knee: Secondary | ICD-10-CM | POA: Diagnosis not present

## 2023-10-14 DIAGNOSIS — Z01818 Encounter for other preprocedural examination: Secondary | ICD-10-CM

## 2023-10-14 DIAGNOSIS — Z01812 Encounter for preprocedural laboratory examination: Secondary | ICD-10-CM | POA: Insufficient documentation

## 2023-10-14 DIAGNOSIS — I129 Hypertensive chronic kidney disease with stage 1 through stage 4 chronic kidney disease, or unspecified chronic kidney disease: Secondary | ICD-10-CM | POA: Insufficient documentation

## 2023-10-14 DIAGNOSIS — N1832 Chronic kidney disease, stage 3b: Secondary | ICD-10-CM | POA: Insufficient documentation

## 2023-10-14 DIAGNOSIS — G8929 Other chronic pain: Secondary | ICD-10-CM | POA: Diagnosis not present

## 2023-10-14 DIAGNOSIS — Z96652 Presence of left artificial knee joint: Secondary | ICD-10-CM | POA: Insufficient documentation

## 2023-10-14 DIAGNOSIS — Z8546 Personal history of malignant neoplasm of prostate: Secondary | ICD-10-CM | POA: Insufficient documentation

## 2023-10-14 DIAGNOSIS — I2510 Atherosclerotic heart disease of native coronary artery without angina pectoris: Secondary | ICD-10-CM | POA: Diagnosis not present

## 2023-10-14 HISTORY — DX: Failed or difficult intubation, initial encounter: T88.4XXA

## 2023-10-14 HISTORY — DX: Myoneural disorder, unspecified: G70.9

## 2023-10-14 HISTORY — DX: Cardiac arrhythmia, unspecified: I49.9

## 2023-10-14 HISTORY — DX: Gastro-esophageal reflux disease without esophagitis: K21.9

## 2023-10-14 HISTORY — DX: Headache, unspecified: R51.9

## 2023-10-14 HISTORY — DX: Prediabetes: R73.03

## 2023-10-14 LAB — CBC WITH DIFFERENTIAL/PLATELET
Abs Immature Granulocytes: 0.02 10*3/uL (ref 0.00–0.07)
Basophils Absolute: 0.1 10*3/uL (ref 0.0–0.1)
Basophils Relative: 1 %
Eosinophils Absolute: 0.3 10*3/uL (ref 0.0–0.5)
Eosinophils Relative: 5 %
HCT: 44.6 % (ref 39.0–52.0)
Hemoglobin: 14.9 g/dL (ref 13.0–17.0)
Immature Granulocytes: 0 %
Lymphocytes Relative: 26 %
Lymphs Abs: 1.6 10*3/uL (ref 0.7–4.0)
MCH: 30.1 pg (ref 26.0–34.0)
MCHC: 33.4 g/dL (ref 30.0–36.0)
MCV: 90.1 fL (ref 80.0–100.0)
Monocytes Absolute: 0.4 10*3/uL (ref 0.1–1.0)
Monocytes Relative: 7 %
Neutro Abs: 3.7 10*3/uL (ref 1.7–7.7)
Neutrophils Relative %: 61 %
Platelets: 246 10*3/uL (ref 150–400)
RBC: 4.95 MIL/uL (ref 4.22–5.81)
RDW: 12.1 % (ref 11.5–15.5)
WBC: 6.1 10*3/uL (ref 4.0–10.5)
nRBC: 0 % (ref 0.0–0.2)

## 2023-10-14 LAB — COMPREHENSIVE METABOLIC PANEL WITH GFR
ALT: 15 U/L (ref 0–44)
AST: 17 U/L (ref 15–41)
Albumin: 4 g/dL (ref 3.5–5.0)
Alkaline Phosphatase: 83 U/L (ref 38–126)
Anion gap: 10 (ref 5–15)
BUN: 20 mg/dL (ref 8–23)
CO2: 24 mmol/L (ref 22–32)
Calcium: 9.1 mg/dL (ref 8.9–10.3)
Chloride: 103 mmol/L (ref 98–111)
Creatinine, Ser: 1.28 mg/dL — ABNORMAL HIGH (ref 0.61–1.24)
GFR, Estimated: >60 mL/min (ref >60)
Glucose, Bld: 150 mg/dL — ABNORMAL HIGH (ref 70–99)
Potassium: 3.7 mmol/L (ref 3.5–5.1)
Sodium: 137 mmol/L (ref 135–145)
Total Bilirubin: 1.1 mg/dL (ref 0.0–1.2)
Total Protein: 7.4 g/dL (ref 6.5–8.1)

## 2023-10-14 LAB — TYPE AND SCREEN
ABO/RH(D): A POS
Antibody Screen: NEGATIVE

## 2023-10-14 LAB — SURGICAL PCR SCREEN
MRSA, PCR: NEGATIVE
Staphylococcus aureus: NEGATIVE

## 2023-10-15 NOTE — Progress Notes (Signed)
 Anesthesia Chart Review   Case: [id number] Date/Time: 10/27/23 0815   Procedure: IRRIGATION AND DEBRIDEMENT KNEE WITH POLY EXCHANGE (Left: Knee)   Anesthesia type: Spinal   Pre-op diagnosis: FAILED KNEE PROSTHESIS, LEFT   Location: [facility]OM 08 / [facility]   Surgeons: Murleen Arms, MD       DISCUSSION:68 y.o. never smoker with h/o sleep apnea, HTN, atrial fibrillation, s/p watchman implantation 01/2023, nonobstructive CAD, CKD Stage III, prostate cancer, failed knee prosthesis scheduled for above procedure 10/27/2023 with Dr. Priscille Brought.   Per cardiology preoperative evaluation 08/03/2023, " Preoperative Cardiovascular Risk Assessment: According to the Revised Cardiac Risk Index (RCRI), his Perioperative Risk of Major Cardiac Event is (%): 0.4. His Functional Capacity in METs is: 6.61 according to the Duke Activity Status Index (DASI). The patient is doing well from a cardiac perspective. Therefore, based on ACC/AHA guidelines, the patient would be at acceptable risk for the planned procedure without further cardiovascular testing.    The patient was advised that if he develops new symptoms prior to surgery to contact our office to arrange for a follow-up visit, and he verbalized understanding.   Per office protocol, he may hold aspirin  for 5-7 days prior to procedure and should resume as soon as hemodynamically stable postoperatively."  VS: BP (!) 147/78   Pulse (!) 52   Temp 36.6 C   Resp 18   Ht 6\' 3"  (1.905 m)   Wt 120.2 kg   SpO2 98%   BMI 33.12 kg/m   PROVIDERS: Towanda Fret, MD is PCP   Cardiologist:  Teddie Favre, MD  LABS: Labs reviewed: Acceptable for surgery. (all labs ordered are listed, but only abnormal results are displayed)  Labs Reviewed  COMPREHENSIVE METABOLIC PANEL WITH GFR - Abnormal; Notable for the following components:      Result Value   Glucose, Bld 150 (*)    Creatinine, Ser 1.28 (*)    All other components within normal limits   SURGICAL PCR SCREEN  CBC WITH DIFFERENTIAL/PLATELET  TYPE AND SCREEN     IMAGES:   EKG:   CV: Echo 02/04/2023 1. Prior to procedure, patient had a patent left atrial appendage. Distal  to the ostium, sufficient measurements for a 27 mm Watchman FLX Pro.   2. Atrial septal aneursym, lipomatous interatrial septal hypertrophy, and  patient foramen ovale noted. Transeptal puncture with no complications.   3. A 27 mm Watchman FLX Pro placed. No peri-device leak. No mitral  shoulder. No thrombus. Average compression ~ 19%.   4. No change to pericardial effusion. 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 Successful Watchman placement.   5. Left ventricular ejection fraction, by estimation, is 55 to 60%. The  left ventricle has normal function.   6. Right ventricular systolic function is normal. The right ventricular  size is normal.   7. Left atrial size was mild to moderately dilated. No left atrial/left  atrial appendage thrombus was detected.   8. A small pericardial effusion is present. The pericardial effusion is  posterior to the left ventricle.   9. The mitral valve is abnormal. Mild mitral valve regurgitation. No  evidence of mitral stenosis.  10. The aortic valve is tricuspid. Aortic valve regurgitation is not  visualized. No aortic stenosis is present.  11. Aortic dilatation noted. Aneurysm of the ascending aorta, measuring 45  mm. There is mild dilatation of the aortic root, measuring 41 mm.  12. The inferior vena cava is  normal in size with greater than 50%  respiratory variability, suggesting right atrial pressure of 3 mmHg.  13. Evidence of atrial level shunting detected by color flow Doppler.   Past Medical History:  Diagnosis Date   Allergy    Arthritis    Atrial fibrillation (HCC)    Diagnosed 09/2009, on flecainide    Back pain    Bilateral swelling of feet    Bradycardia    Event monitor 2010: HR down to 45, asymptomatic.    Cataract    Coronary atherosclerosis of native coronary artery    Nonobstructive and distal disease by cath 2005.    Difficult intubation    Difficult airway noted pt. unaware   Dysrhythmia    a-fib   Essential hypertension    GERD (gastroesophageal reflux disease)    occasional   Headache    migraine affects vision   Hyperlipidemia    Joint pain    Neuromuscular disorder (HCC)    Sciatica   OSA (obstructive sleep apnea)    Osteoarthritis    Overweight    Pre-diabetes    Prediabetes    Presence of Watchman left atrial appendage closure device 02/04/2023   Watchman 27mm FLX Pro placed by Dr. Marven Slimmer   Prostate cancer [facility]) 05/2022   following via biopsy   Renal cyst    Ringing in ears    Sleep apnea    borderline no cpap   Vitamin D  deficiency     Past Surgical History:  Procedure Laterality Date   BIOPSY  01/04/2018   Procedure: BIOPSY;  Surgeon: Alyce Jubilee, MD;  [facility];  Service: Endoscopy;;  ascending colon    CARDIAC CATHETERIZATION  2005   COLONOSCOPY  05/2006   SLF: 3 mm sigmoid: Tubular adenoma removed.   COLONOSCOPY N/A 05/06/2016   Dr. Nolene Baumgarten: two sessile polyps in mid transverse colon and hepatic flexure (tubular adenomas). ext/int hemorrhoids. tortuous left colon.    COLONOSCOPY WITH PROPOFOL  N/A 01/04/2018   Procedure: COLONOSCOPY WITH PROPOFOL ;  Surgeon: Alyce Jubilee, MD;  [facility];  Service: Endoscopy;  Laterality: N/A;  2:30pm   KNEE ARTHROSCOPY     Left anterior cruciate ligament reconstruction     LEFT ATRIAL APPENDAGE OCCLUSION N/A 02/04/2023   Procedure: LEFT ATRIAL APPENDAGE OCCLUSION;  Surgeon: Boyce Byes, MD;  Location: [facility];  Service: Cardiovascular;  Laterality: N/A;   left shoulder surgery     dr . Porfirio Bristol collins   rotator cuff   POLYPECTOMY  05/06/2016   Procedure: POLYPECTOMY;  Surgeon: Alyce Jubilee, MD;  [facility];  Service: Endoscopy;;  transverse colon polyp,  hepatic flexure polyp,    Right shoulder surgery     rotator cuff   ROBOT ASSISTED LAPAROSCOPIC NEPHRECTOMY Left 07/04/2018   Procedure: XI ROBOTIC ASSISTED LAPAROSCOPIC RENAL CYST DECORTICATION;  Surgeon: Marco Severs, MD;  Location: [facility];  Service: Urology;  Laterality: Left;  3 HRSPROCEDURE: ROBOTIC RENAL CYST DECORTICATION   TEE WITHOUT CARDIOVERSION N/A 02/04/2023   Procedure: TRANSESOPHAGEAL ECHOCARDIOGRAM;  Surgeon: Boyce Byes, MD;  Location: [facility] INVASIVE CV LAB;  Service: Cardiovascular;  Laterality: N/A;   TONSILLECTOMY     TOTAL KNEE ARTHROPLASTY Left     MEDICATIONS:  acetaminophen  (TYLENOL ) 500 MG tablet   amLODipine  (NORVASC ) 10 MG tablet   aspirin  EC 81 MG tablet   cephALEXin  (KEFLEX ) 500 MG capsule   Coenzyme Q10 (CO Q-10) 100 MG CAPS   Evolocumab  (  REPATHA  SURECLICK) 140 MG/ML SOAJ   flecainide  (TAMBOCOR ) 50 MG tablet   losartan  (COZAAR ) 100 MG tablet   meloxicam (MOBIC) 7.5 MG tablet   Multiple Vitamin (MULTIVITAMIN) tablet   omeprazole  (PRILOSEC) 20 MG capsule   tamsulosin  (FLOMAX ) 0.4 MG CAPS capsule   Vitamin D , Ergocalciferol , (DRISDOL ) 1.25 MG (50000 UNIT) CAPS capsule   No current facility-administered medications for this encounter.     Chick Cotton Ward, PA-C [facility] [phone]

## 2023-10-27 ENCOUNTER — Other Ambulatory Visit: Payer: Self-pay

## 2023-10-27 ENCOUNTER — Encounter (HOSPITAL_COMMUNITY)
Admission: RE | Disposition: A | Discharge status: Home Health | Payer: Self-pay | Source: Home / Self Care | Attending: Orthopedic Surgery

## 2023-10-27 ENCOUNTER — Encounter (HOSPITAL_COMMUNITY): Payer: Self-pay | Admitting: Orthopedic Surgery

## 2023-10-27 ENCOUNTER — Inpatient Hospital Stay (HOSPITAL_COMMUNITY)
Admission: RE | Admit: 2023-10-27 | Discharge: 2023-10-28 | DRG: 489 | Disposition: A | Discharge status: Home Health | Attending: Orthopedic Surgery | Admitting: Orthopedic Surgery

## 2023-10-27 ENCOUNTER — Inpatient Hospital Stay (HOSPITAL_COMMUNITY)

## 2023-10-27 ENCOUNTER — Inpatient Hospital Stay (HOSPITAL_COMMUNITY): Admitting: Certified Registered"

## 2023-10-27 ENCOUNTER — Inpatient Hospital Stay (HOSPITAL_COMMUNITY): Payer: Self-pay | Admitting: Physician Assistant

## 2023-10-27 DIAGNOSIS — Z881 Allergy status to other antibiotic agents status: Secondary | ICD-10-CM

## 2023-10-27 DIAGNOSIS — T84023A Instability of internal left knee prosthesis, initial encounter: Secondary | ICD-10-CM

## 2023-10-27 DIAGNOSIS — G43909 Migraine, unspecified, not intractable, without status migrainosus: Secondary | ICD-10-CM | POA: Diagnosis not present

## 2023-10-27 DIAGNOSIS — G4733 Obstructive sleep apnea (adult) (pediatric): Secondary | ICD-10-CM | POA: Diagnosis not present

## 2023-10-27 DIAGNOSIS — E66811 Obesity, class 1: Secondary | ICD-10-CM | POA: Diagnosis present

## 2023-10-27 DIAGNOSIS — T84093A Other mechanical complication of internal left knee prosthesis, initial encounter: Principal | ICD-10-CM | POA: Diagnosis present

## 2023-10-27 DIAGNOSIS — M25362 Other instability, left knee: Secondary | ICD-10-CM | POA: Diagnosis present

## 2023-10-27 DIAGNOSIS — M1711 Unilateral primary osteoarthritis, right knee: Secondary | ICD-10-CM | POA: Diagnosis present

## 2023-10-27 DIAGNOSIS — Z8546 Personal history of malignant neoplasm of prostate: Secondary | ICD-10-CM

## 2023-10-27 DIAGNOSIS — R7303 Prediabetes: Secondary | ICD-10-CM | POA: Diagnosis not present

## 2023-10-27 DIAGNOSIS — Z96652 Presence of left artificial knee joint: Secondary | ICD-10-CM | POA: Diagnosis not present

## 2023-10-27 DIAGNOSIS — E559 Vitamin D deficiency, unspecified: Secondary | ICD-10-CM | POA: Diagnosis present

## 2023-10-27 DIAGNOSIS — Z8261 Family history of arthritis: Secondary | ICD-10-CM

## 2023-10-27 DIAGNOSIS — G8918 Other acute postprocedural pain: Secondary | ICD-10-CM | POA: Diagnosis not present

## 2023-10-27 DIAGNOSIS — T84018A Broken internal joint prosthesis, other site, initial encounter: Principal | ICD-10-CM

## 2023-10-27 DIAGNOSIS — Z83438 Family history of other disorder of lipoprotein metabolism and other lipidemia: Secondary | ICD-10-CM

## 2023-10-27 DIAGNOSIS — Y792 Prosthetic and other implants, materials and accessory orthopedic devices associated with adverse incidents: Secondary | ICD-10-CM | POA: Diagnosis present

## 2023-10-27 DIAGNOSIS — H9113 Presbycusis, bilateral: Secondary | ICD-10-CM | POA: Diagnosis present

## 2023-10-27 DIAGNOSIS — N1831 Chronic kidney disease, stage 3a: Secondary | ICD-10-CM | POA: Diagnosis not present

## 2023-10-27 DIAGNOSIS — I480 Paroxysmal atrial fibrillation: Secondary | ICD-10-CM | POA: Diagnosis not present

## 2023-10-27 DIAGNOSIS — I129 Hypertensive chronic kidney disease with stage 1 through stage 4 chronic kidney disease, or unspecified chronic kidney disease: Secondary | ICD-10-CM | POA: Diagnosis present

## 2023-10-27 DIAGNOSIS — Z887 Allergy status to serum and vaccine status: Secondary | ICD-10-CM

## 2023-10-27 DIAGNOSIS — I2510 Atherosclerotic heart disease of native coronary artery without angina pectoris: Secondary | ICD-10-CM | POA: Diagnosis not present

## 2023-10-27 DIAGNOSIS — Z8249 Family history of ischemic heart disease and other diseases of the circulatory system: Secondary | ICD-10-CM | POA: Diagnosis not present

## 2023-10-27 DIAGNOSIS — Z833 Family history of diabetes mellitus: Secondary | ICD-10-CM

## 2023-10-27 DIAGNOSIS — T84013A Broken internal left knee prosthesis, initial encounter: Secondary | ICD-10-CM | POA: Diagnosis not present

## 2023-10-27 DIAGNOSIS — Z905 Acquired absence of kidney: Secondary | ICD-10-CM

## 2023-10-27 DIAGNOSIS — N400 Enlarged prostate without lower urinary tract symptoms: Secondary | ICD-10-CM | POA: Diagnosis present

## 2023-10-27 DIAGNOSIS — Z95818 Presence of other cardiac implants and grafts: Secondary | ICD-10-CM | POA: Diagnosis not present

## 2023-10-27 DIAGNOSIS — Z6832 Body mass index (BMI) 32.0-32.9, adult: Secondary | ICD-10-CM | POA: Diagnosis not present

## 2023-10-27 DIAGNOSIS — Z96659 Presence of unspecified artificial knee joint: Principal | ICD-10-CM

## 2023-10-27 DIAGNOSIS — Z79899 Other long term (current) drug therapy: Secondary | ICD-10-CM

## 2023-10-27 DIAGNOSIS — Z880 Allergy status to penicillin: Secondary | ICD-10-CM

## 2023-10-27 DIAGNOSIS — M1812 Unilateral primary osteoarthritis of first carpometacarpal joint, left hand: Secondary | ICD-10-CM | POA: Diagnosis present

## 2023-10-27 DIAGNOSIS — Z471 Aftercare following joint replacement surgery: Secondary | ICD-10-CM | POA: Diagnosis not present

## 2023-10-27 DIAGNOSIS — E7849 Other hyperlipidemia: Secondary | ICD-10-CM | POA: Diagnosis present

## 2023-10-27 DIAGNOSIS — Z7982 Long term (current) use of aspirin: Secondary | ICD-10-CM | POA: Diagnosis not present

## 2023-10-27 DIAGNOSIS — Z791 Long term (current) use of non-steroidal anti-inflammatories (NSAID): Secondary | ICD-10-CM

## 2023-10-27 DIAGNOSIS — K219 Gastro-esophageal reflux disease without esophagitis: Secondary | ICD-10-CM | POA: Diagnosis present

## 2023-10-27 DIAGNOSIS — Z860101 Personal history of adenomatous and serrated colon polyps: Secondary | ICD-10-CM

## 2023-10-27 DIAGNOSIS — M25562 Pain in left knee: Secondary | ICD-10-CM | POA: Diagnosis present

## 2023-10-27 DIAGNOSIS — M25462 Effusion, left knee: Secondary | ICD-10-CM | POA: Diagnosis not present

## 2023-10-27 HISTORY — PX: I & D KNEE WITH POLY EXCHANGE: SHX5024

## 2023-10-27 SURGERY — IRRIGATION AND DEBRIDEMENT KNEE WITH POLY EXCHANGE
Anesthesia: General | Site: Knee | Laterality: Left

## 2023-10-27 MED ORDER — DOCUSATE SODIUM 100 MG PO CAPS
100.0000 mg | ORAL_CAPSULE | Freq: Two times a day (BID) | ORAL | Status: DC
  Filled 2023-10-27: qty 1

## 2023-10-27 MED ORDER — METHOCARBAMOL 500 MG PO TABS: 500.0000 mg | ORAL_TABLET | Freq: Three times a day (TID) | ORAL | 0 refills | Status: AC | PRN

## 2023-10-27 MED ORDER — ONDANSETRON HCL 4 MG PO TABS: 4.0000 mg | ORAL_TABLET | Freq: Three times a day (TID) | ORAL | 0 refills | Status: AC | PRN

## 2023-10-27 MED ORDER — FENTANYL CITRATE (PF) 100 MCG/2ML IJ SOLN
INTRAMUSCULAR | Status: DC | PRN
  Administered 2023-10-27 (×2): 50 ug via INTRAVENOUS
  Administered 2023-10-27: 25 ug via INTRAVENOUS

## 2023-10-27 MED ORDER — DEXAMETHASONE SODIUM PHOSPHATE 10 MG/ML IJ SOLN
INTRAMUSCULAR | Status: DC | PRN
  Administered 2023-10-27: 8 mg via INTRAVENOUS

## 2023-10-27 MED ORDER — ONDANSETRON HCL 4 MG/2ML IJ SOLN
INTRAMUSCULAR | Status: DC | PRN
  Administered 2023-10-27: 4 mg via INTRAVENOUS

## 2023-10-27 MED ORDER — SODIUM CHLORIDE (PF) 0.9 % IJ SOLN
INTRAMUSCULAR | Status: AC
  Filled 2023-10-27: qty 30

## 2023-10-27 MED ORDER — ONDANSETRON HCL 4 MG/2ML IJ SOLN: 4.0000 mg | Freq: Four times a day (QID) | INTRAMUSCULAR | Status: DC | PRN

## 2023-10-27 MED ORDER — PANTOPRAZOLE SODIUM 40 MG PO TBEC
40.0000 mg | DELAYED_RELEASE_TABLET | Freq: Every day | ORAL | Status: DC
Start: 2023-10-27 — End: 2023-10-28
  Administered 2023-10-27 – 2023-10-28 (×2): 40 mg via ORAL
  Filled 2023-10-27 (×2): qty 1

## 2023-10-27 MED ORDER — OXYCODONE HCL 5 MG/5ML PO SOLN: 5.0000 mg | Freq: Once | ORAL | Status: AC | PRN

## 2023-10-27 MED ORDER — METHOCARBAMOL 1000 MG/10ML IJ SOLN: 500.0000 mg | Freq: Four times a day (QID) | INTRAMUSCULAR | Status: DC | PRN

## 2023-10-27 MED ORDER — CEFADROXIL 500 MG PO CAPS: 500.0000 mg | ORAL_CAPSULE | Freq: Two times a day (BID) | ORAL | 0 refills | Status: AC

## 2023-10-27 MED ORDER — OXYCODONE HCL 5 MG PO TABS: 5.0000 mg | ORAL_TABLET | ORAL | 0 refills | Status: AC | PRN

## 2023-10-27 MED ORDER — RIVAROXABAN 10 MG PO TABS
10.0000 mg | ORAL_TABLET | Freq: Every day | ORAL | Status: DC
Start: 2023-10-28 — End: 2023-10-28
  Administered 2023-10-28: 10 mg via ORAL
  Filled 2023-10-27: qty 1

## 2023-10-27 MED ORDER — MENTHOL 3 MG MT LOZG: 1.0000 | LOZENGE | OROMUCOSAL | Status: DC | PRN

## 2023-10-27 MED ORDER — SODIUM CHLORIDE (PF) 0.9 % IJ SOLN
INTRAMUSCULAR | Status: DC | PRN
  Administered 2023-10-27: 80 mL

## 2023-10-27 MED ORDER — VANCOMYCIN HCL 1000 MG IV SOLR
INTRAVENOUS | Status: AC
  Filled 2023-10-27: qty 20

## 2023-10-27 MED ORDER — PHENOL 1.4 % MT LIQD: 1.0000 | OROMUCOSAL | Status: DC | PRN

## 2023-10-27 MED ORDER — RIVAROXABAN 10 MG PO TABS: 10.0000 mg | ORAL_TABLET | Freq: Every day | ORAL | 0 refills | Status: DC

## 2023-10-27 MED ORDER — ACETAMINOPHEN 500 MG PO TABS
1000.0000 mg | ORAL_TABLET | Freq: Four times a day (QID) | ORAL | Status: AC
  Administered 2023-10-27 (×3): 1000 mg via ORAL
  Filled 2023-10-27 (×4): qty 2

## 2023-10-27 MED ORDER — ONDANSETRON HCL 4 MG PO TABS: 4.0000 mg | ORAL_TABLET | Freq: Four times a day (QID) | ORAL | Status: DC | PRN

## 2023-10-27 MED ORDER — LACTATED RINGERS IV SOLN: INTRAVENOUS | Status: DC | PRN

## 2023-10-27 MED ORDER — ORAL CARE MOUTH RINSE: 15.0000 mL | Freq: Once | OROMUCOSAL | Status: AC

## 2023-10-27 MED ORDER — VASOPRESSIN 20 UNIT/ML IV SOLN
INTRAVENOUS | Status: AC
  Filled 2023-10-27: qty 1

## 2023-10-27 MED ORDER — ISOPROPYL ALCOHOL 70 % SOLN
Status: AC
  Filled 2023-10-27: qty 480

## 2023-10-27 MED ORDER — PROPOFOL 10 MG/ML IV BOLUS
INTRAVENOUS | Status: DC | PRN
  Administered 2023-10-27: 200 mg via INTRAVENOUS

## 2023-10-27 MED ORDER — METHOCARBAMOL 500 MG PO TABS
ORAL_TABLET | ORAL | Status: AC
  Filled 2023-10-27: qty 1

## 2023-10-27 MED ORDER — OXYCODONE HCL 5 MG PO TABS
ORAL_TABLET | ORAL | Status: AC
  Filled 2023-10-27: qty 1

## 2023-10-27 MED ORDER — POVIDONE-IODINE 10 % EX SWAB
2.0000 | Freq: Once | CUTANEOUS | Status: AC
  Administered 2023-10-27: 2 via TOPICAL

## 2023-10-27 MED ORDER — POLYETHYLENE GLYCOL 3350 17 G PO PACK: 17.0000 g | PACK | Freq: Every day | ORAL | 0 refills | Status: AC

## 2023-10-27 MED ORDER — LACTATED RINGERS IV SOLN
INTRAVENOUS | Status: DC
Start: 2023-10-27 — End: 2023-10-28

## 2023-10-27 MED ORDER — METHOCARBAMOL 500 MG PO TABS
500.0000 mg | ORAL_TABLET | Freq: Four times a day (QID) | ORAL | Status: DC | PRN
Start: 2023-10-27 — End: 2023-10-28
  Administered 2023-10-27 (×2): 500 mg via ORAL
  Filled 2023-10-27: qty 1

## 2023-10-27 MED ORDER — ACETAMINOPHEN 500 MG PO TABS: 1000.0000 mg | ORAL_TABLET | Freq: Three times a day (TID) | ORAL | Status: AC | PRN

## 2023-10-27 MED ORDER — ONDANSETRON HCL 4 MG/2ML IJ SOLN
INTRAMUSCULAR | Status: AC
  Filled 2023-10-27: qty 2

## 2023-10-27 MED ORDER — BUPIVACAINE-EPINEPHRINE (PF) 0.25% -1:200000 IJ SOLN: INTRAMUSCULAR | Status: DC | PRN

## 2023-10-27 MED ORDER — OXYCODONE HCL 5 MG PO TABS
5.0000 mg | ORAL_TABLET | Freq: Once | ORAL | Status: AC | PRN
  Administered 2023-10-27: 5 mg via ORAL

## 2023-10-27 MED ORDER — PRONTOSAN WOUND IRRIGATION OPTIME
TOPICAL | Status: DC | PRN
  Administered 2023-10-27: 350 mL

## 2023-10-27 MED ORDER — MIDAZOLAM HCL 2 MG/2ML IJ SOLN: 1.0000 mg | INTRAMUSCULAR | Status: DC

## 2023-10-27 MED ORDER — PROPOFOL 10 MG/ML IV BOLUS
INTRAVENOUS | Status: AC
  Filled 2023-10-27: qty 20

## 2023-10-27 MED ORDER — LIDOCAINE HCL (PF) 2 % IJ SOLN
INTRAMUSCULAR | Status: DC | PRN
  Administered 2023-10-27: 100 mg via INTRADERMAL

## 2023-10-27 MED ORDER — 0.9 % SODIUM CHLORIDE (POUR BTL) OPTIME
TOPICAL | Status: DC | PRN
  Administered 2023-10-27: 1000 mL

## 2023-10-27 MED ORDER — OXYCODONE HCL 5 MG PO TABS
5.0000 mg | ORAL_TABLET | ORAL | Status: DC | PRN
  Administered 2023-10-28 (×2): 5 mg via ORAL
  Filled 2023-10-27 (×2): qty 1

## 2023-10-27 MED ORDER — BUPIVACAINE-EPINEPHRINE (PF) 0.25% -1:200000 IJ SOLN
INTRAMUSCULAR | Status: AC
  Filled 2023-10-27: qty 30

## 2023-10-27 MED ORDER — FLECAINIDE ACETATE 50 MG PO TABS
50.0000 mg | ORAL_TABLET | Freq: Two times a day (BID) | ORAL | Status: DC
  Administered 2023-10-27 – 2023-10-28 (×2): 50 mg via ORAL
  Filled 2023-10-27 (×3): qty 1

## 2023-10-27 MED ORDER — LOSARTAN POTASSIUM 50 MG PO TABS
100.0000 mg | ORAL_TABLET | Freq: Every day | ORAL | Status: DC
  Administered 2023-10-28: 100 mg via ORAL
  Filled 2023-10-27: qty 2

## 2023-10-27 MED ORDER — FENTANYL CITRATE (PF) 100 MCG/2ML IJ SOLN
INTRAMUSCULAR | Status: AC
Start: 2023-10-27 — End: 2023-10-27
  Filled 2023-10-27: qty 2

## 2023-10-27 MED ORDER — CEFAZOLIN SODIUM-DEXTROSE 3-4 GM/150ML-% IV SOLN
3.0000 g | INTRAVENOUS | Status: AC
  Administered 2023-10-27: 2 g via INTRAVENOUS
  Filled 2023-10-27: qty 150

## 2023-10-27 MED ORDER — MELOXICAM 7.5 MG PO TABS: 7.5000 mg | ORAL_TABLET | Freq: Every day | ORAL | 0 refills | Status: AC | PRN

## 2023-10-27 MED ORDER — DEXAMETHASONE SODIUM PHOSPHATE 10 MG/ML IJ SOLN: 8.0000 mg | Freq: Once | INTRAMUSCULAR | Status: DC

## 2023-10-27 MED ORDER — MIDAZOLAM HCL 2 MG/2ML IJ SOLN
INTRAMUSCULAR | Status: DC | PRN
  Administered 2023-10-27: 2 mg via INTRAVENOUS

## 2023-10-27 MED ORDER — ACETAMINOPHEN 500 MG PO TABS
1000.0000 mg | ORAL_TABLET | Freq: Once | ORAL | Status: AC
  Administered 2023-10-27: 1000 mg via ORAL
  Filled 2023-10-27: qty 2

## 2023-10-27 MED ORDER — FENTANYL CITRATE PF 50 MCG/ML IJ SOSY: 50.0000 ug | PREFILLED_SYRINGE | INTRAMUSCULAR | Status: DC

## 2023-10-27 MED ORDER — LACTATED RINGERS IV SOLN: INTRAVENOUS | Status: DC

## 2023-10-27 MED ORDER — MIDAZOLAM HCL 2 MG/2ML IJ SOLN
INTRAMUSCULAR | Status: AC
Start: 2023-10-27 — End: 2023-10-27
  Filled 2023-10-27: qty 2

## 2023-10-27 MED ORDER — ZOLPIDEM TARTRATE 5 MG PO TABS
5.0000 mg | ORAL_TABLET | Freq: Every evening | ORAL | Status: DC | PRN
Start: 2023-10-27 — End: 2023-10-28

## 2023-10-27 MED ORDER — POLYETHYLENE GLYCOL 3350 17 G PO PACK: 17.0000 g | PACK | Freq: Every day | ORAL | Status: DC | PRN

## 2023-10-27 MED ORDER — DIPHENHYDRAMINE HCL 12.5 MG/5ML PO ELIX: 12.5000 mg | ORAL_SOLUTION | ORAL | Status: DC | PRN

## 2023-10-27 MED ORDER — TRANEXAMIC ACID-NACL 1000-0.7 MG/100ML-% IV SOLN
1000.0000 mg | INTRAVENOUS | Status: DC
  Filled 2023-10-27: qty 100

## 2023-10-27 MED ORDER — SODIUM CHLORIDE 0.9 % IV SOLN: INTRAVENOUS | Status: DC

## 2023-10-27 MED ORDER — EPHEDRINE SULFATE-NACL 50-0.9 MG/10ML-% IV SOSY
PREFILLED_SYRINGE | INTRAVENOUS | Status: DC | PRN
  Administered 2023-10-27 (×3): 5 mg via INTRAVENOUS
  Administered 2023-10-27: 10 mg via INTRAVENOUS

## 2023-10-27 MED ORDER — CHLORHEXIDINE GLUCONATE 0.12 % MT SOLN
15.0000 mL | Freq: Once | OROMUCOSAL | Status: AC
  Administered 2023-10-27: 15 mL via OROMUCOSAL

## 2023-10-27 MED ORDER — ACETAMINOPHEN 325 MG PO TABS: 325.0000 mg | ORAL_TABLET | Freq: Four times a day (QID) | ORAL | Status: DC | PRN

## 2023-10-27 MED ORDER — CLONIDINE HCL (ANALGESIA) 100 MCG/ML EP SOLN
EPIDURAL | Status: DC | PRN
  Administered 2023-10-27: 50 ug

## 2023-10-27 MED ORDER — TRANEXAMIC ACID-NACL 1000-0.7 MG/100ML-% IV SOLN
INTRAVENOUS | Status: DC | PRN
  Administered 2023-10-27: 1000 mg via INTRAVENOUS

## 2023-10-27 MED ORDER — ROPIVACAINE HCL 5 MG/ML IJ SOLN
INTRAMUSCULAR | Status: DC | PRN
  Administered 2023-10-27: 20 mL via PERINEURAL

## 2023-10-27 MED ORDER — CEFAZOLIN SODIUM-DEXTROSE 2-4 GM/100ML-% IV SOLN
2.0000 g | Freq: Three times a day (TID) | INTRAVENOUS | Status: DC
  Administered 2023-10-27 – 2023-10-28 (×4): 2 g via INTRAVENOUS
  Filled 2023-10-27 (×4): qty 100

## 2023-10-27 MED ORDER — KETOROLAC TROMETHAMINE 15 MG/ML IJ SOLN
7.5000 mg | Freq: Four times a day (QID) | INTRAMUSCULAR | Status: AC
  Administered 2023-10-27 – 2023-10-28 (×4): 7.5 mg via INTRAVENOUS
  Filled 2023-10-27 (×4): qty 1

## 2023-10-27 MED ORDER — HYDROMORPHONE HCL 1 MG/ML IJ SOLN: 0.5000 mg | INTRAMUSCULAR | Status: DC | PRN

## 2023-10-27 MED ORDER — TAMSULOSIN HCL 0.4 MG PO CAPS
0.4000 mg | ORAL_CAPSULE | ORAL | Status: DC
  Filled 2023-10-27 (×2): qty 1

## 2023-10-27 MED ORDER — AMLODIPINE BESYLATE 10 MG PO TABS
10.0000 mg | ORAL_TABLET | Freq: Every day | ORAL | Status: DC
  Administered 2023-10-28: 10 mg via ORAL
  Filled 2023-10-27: qty 1

## 2023-10-27 MED ORDER — FENTANYL CITRATE PF 50 MCG/ML IJ SOSY: 25.0000 ug | PREFILLED_SYRINGE | INTRAMUSCULAR | Status: DC | PRN

## 2023-10-27 MED ORDER — STERILE WATER FOR IRRIGATION IR SOLN
Status: DC | PRN
  Administered 2023-10-27: 1000 mL

## 2023-10-27 MED ORDER — FENTANYL CITRATE (PF) 100 MCG/2ML IJ SOLN
INTRAMUSCULAR | Status: AC
  Filled 2023-10-27: qty 2

## 2023-10-27 MED ORDER — BUPIVACAINE LIPOSOME 1.3 % IJ SUSP
INTRAMUSCULAR | Status: AC
  Filled 2023-10-27: qty 20

## 2023-10-27 MED ORDER — SODIUM CHLORIDE 0.9 % IV SOLN: INTRAVENOUS | Status: DC | PRN

## 2023-10-27 MED ORDER — AMISULPRIDE (ANTIEMETIC) 5 MG/2ML IV SOLN: 10.0000 mg | Freq: Once | INTRAVENOUS | Status: DC | PRN

## 2023-10-27 MED ORDER — BUPIVACAINE LIPOSOME 1.3 % IJ SUSP: 20.0000 mL | Freq: Once | INTRAMUSCULAR | Status: DC

## 2023-10-27 MED ORDER — DEXAMETHASONE SODIUM PHOSPHATE 10 MG/ML IJ SOLN
INTRAMUSCULAR | Status: AC
  Filled 2023-10-27: qty 1

## 2023-10-27 SURGICAL SUPPLY — 51 items
BAG COUNTER SPONGE SURGICOUNT (BAG) ×2 IMPLANT
BLADE SAGITTAL 25.0X1.37X90 (BLADE) IMPLANT
BLADE SURG 15 STRL LF DISP TIS (BLADE) ×2 IMPLANT
BLADE SURG SZ10 CARB STEEL (BLADE) ×4 IMPLANT
BNDG COHESIVE 6X5 TAN ST LF (GAUZE/BANDAGES/DRESSINGS) IMPLANT
BNDG ELASTIC 6X10 VLCR STRL LF (GAUZE/BANDAGES/DRESSINGS) ×2 IMPLANT
CANISTER PREVENA PLUS 150 (CANNISTER) IMPLANT
CHLORAPREP W/TINT 26 (MISCELLANEOUS) ×2 IMPLANT
CLSR STERI-STRIP ANTIMIC 1/2X4 (GAUZE/BANDAGES/DRESSINGS) ×2 IMPLANT
CNTNR URN SCR LID CUP LEK RST (MISCELLANEOUS) ×6 IMPLANT
COVER SURGICAL LIGHT HANDLE (MISCELLANEOUS) ×2 IMPLANT
CUFF TRNQT CYL 24X4X16.5-23 (TOURNIQUET CUFF) ×2 IMPLANT
CUFF TRNQT CYL 34X4.125X (TOURNIQUET CUFF) IMPLANT
DRAPE INCISE IOBAN 66X45 STRL (DRAPES) ×4 IMPLANT
DRAPE SHEET LG 3/4 BI-LAMINATE (DRAPES) ×6 IMPLANT
DRAPE U-SHAPE 47X51 STRL (DRAPES) ×2 IMPLANT
DRESSING PEEL AND PLAC PRVNA20 (GAUZE/BANDAGES/DRESSINGS) IMPLANT
DRSG MEPILEX POST OP 4X12 (GAUZE/BANDAGES/DRESSINGS) ×2 IMPLANT
ELECT BLADE TIP CTD 4 INCH (ELECTRODE) IMPLANT
EVACUATOR 1/8 PVC DRAIN (DRAIN) ×2 IMPLANT
GLOVE BIO SURGEON STRL SZ7.5 (GLOVE) ×4 IMPLANT
GLOVE BIOGEL PI IND STRL 7.5 (GLOVE) ×2 IMPLANT
GLOVE BIOGEL PI IND STRL 8 (GLOVE) ×2 IMPLANT
GLOVE PI ORTHO PRO STRL SZ8 (GLOVE) ×4 IMPLANT
GOWN STRL REUS W/ TWL LRG LVL3 (GOWN DISPOSABLE) ×2 IMPLANT
GOWN STRL SURGICAL XL XLNG (GOWN DISPOSABLE) ×2 IMPLANT
HOLDER FOLEY CATH W/STRAP (MISCELLANEOUS) ×2 IMPLANT
IMMOBILIZER KNEE 22 UNIV (SOFTGOODS) ×2 IMPLANT
KIT DRSG PREVENA PLUS 7DAY 125 (MISCELLANEOUS) IMPLANT
KIT TURNOVER KIT A (KITS) ×4 IMPLANT
MANIFOLD NEPTUNE II (INSTRUMENTS) ×2 IMPLANT
MARKER SKIN DUAL TIP RULER LAB (MISCELLANEOUS) ×2 IMPLANT
NS IRRIG 1000ML POUR BTL (IV SOLUTION) ×2 IMPLANT
PACK TOTAL KNEE CUSTOM (KITS) ×2 IMPLANT
PLATE ROT INSERT 12.5MM (Plate) IMPLANT
PROTECTOR NERVE ULNAR (MISCELLANEOUS) ×2 IMPLANT
SET HNDPC FAN SPRY TIP SCT (DISPOSABLE) ×2 IMPLANT
SOLUTION PRONTOSAN WOUND 350ML (IRRIGATION / IRRIGATOR) IMPLANT
SPIKE FLUID TRANSFER (MISCELLANEOUS) ×2 IMPLANT
SUT ETHILON 3 0 FSL (SUTURE) ×4 IMPLANT
SUT PDS AB 0 CT 36 (SUTURE) ×2 IMPLANT
SUT PDS AB 2-0 CT2 27 (SUTURE) ×4 IMPLANT
SUT STRATAFIX 14 PDO 48 VLT (SUTURE) ×2 IMPLANT
SUT VIC AB 0 CT1 36 (SUTURE) ×2 IMPLANT
SUT VIC AB 1 CTX36XBRD ANBCTR (SUTURE) IMPLANT
SUTURE STRATFX 0 PDS 27 VIOLET (SUTURE) ×2 IMPLANT
TOOTHBRUSH ADULT (PERSONAL CARE ITEMS) ×2 IMPLANT
TRAY FOLEY MTR SLVR 14FR STAT (SET/KITS/TRAYS/PACK) ×2 IMPLANT
TRAY FOLEY MTR SLVR 16FR STAT (SET/KITS/TRAYS/PACK) ×2 IMPLANT
TUBE SUCTION HIGH CAP CLEAR NV (SUCTIONS) ×2 IMPLANT
WRAP KNEE MAXI GEL POST OP (GAUZE/BANDAGES/DRESSINGS) ×2 IMPLANT

## 2023-10-27 NOTE — Anesthesia Preprocedure Evaluation (Signed)
 Anesthesia Evaluation  Patient identified by MRN, date of birth, ID band Patient awake    Reviewed: Allergy & Precautions, NPO status , Patient's Chart, lab work & pertinent test results  History of Anesthesia Complications (+) DIFFICULT AIRWAY and history of anesthetic complications  Airway Mallampati: IV  TM Distance: <3 FB Neck ROM: Full    Dental   Pulmonary sleep apnea    breath sounds clear to auscultation       Cardiovascular hypertension, Pt. on medications + CAD  + dysrhythmias Atrial Fibrillation  Rhythm:Regular Rate:Normal     Neuro/Psych negative neurological ROS     GI/Hepatic Neg liver ROS,GERD  ,,  Endo/Other  negative endocrine ROS    Renal/GU CRFRenal disease     Musculoskeletal  (+) Arthritis ,    Abdominal   Peds  Hematology negative hematology ROS (+)   Anesthesia Other Findings   Reproductive/Obstetrics                             Anesthesia Physical Anesthesia Plan  ASA: 2  Anesthesia Plan: General   Post-op Pain Management: Regional block* and Tylenol  PO (pre-op)*   Induction: Intravenous  PONV Risk Score and Plan: 2 and Dexamethasone , Ondansetron  and Treatment may vary due to age or medical condition  Airway Management Planned: LMA  Additional Equipment:   Intra-op Plan:   Post-operative Plan: Extubation in OR  Informed Consent: I have reviewed the patients History and Physical, chart, labs and discussed the procedure including the risks, benefits and alternatives for the proposed anesthesia with the patient or authorized representative who has indicated his/her understanding and acceptance.     Dental advisory given  Plan Discussed with: CRNA  Anesthesia Plan Comments:        Anesthesia Quick Evaluation

## 2023-10-27 NOTE — Discharge Instructions (Addendum)
 INSTRUCTIONS AFTER JOINT REPLACEMENT   Remove items at home which could result in a fall. This includes throw rugs or furniture in walking pathways ICE to the affected joint every three hours while awake for 30 minutes at a time, for at least the first 3-5 days, and then as needed for pain and swelling.  Continue to use ice for pain and swelling. You may notice swelling that will progress down to the foot and ankle.  This is normal after surgery.  Elevate your leg when you are not up walking on it.   Continue to use the breathing machine you got in the hospital (incentive spirometer) which will help keep your temperature down.  It is common for your temperature to cycle up and down following surgery, especially at night when you are not up moving around and exerting yourself.  The breathing machine keeps your lungs expanded and your temperature down.  DIET:  As you were doing prior to hospitalization, we recommend a well-balanced diet.  DRESSING / WOUND CARE / SHOWERING:  Keep the surgical dressing until follow up.  This has battery powered suction to be kept at 125 mmHg and lasts 7 days.  If the battery stops sooner than 7 days, then call our office for further instructions.    The dressing is also water  resistant, however it is best to shower with an extra covering, such as saran wrap to keep it dry.  IF THE DRESSING FALLS OFF or the wound gets wet inside, change the dressing with sterile gauze and call our office for further instruction.  Please use good hand washing techniques before changing the dressing.  Do not use any lotions or creams on the incision until instructed by your surgeon.     ACTIVITY  Increase activity slowly as tolerated, but follow the weight bearing instructions below.   No driving for 6 weeks or until further direction given by your physician.  You cannot drive while taking narcotics.  No lifting or carrying greater than 10 lbs. until further directed by your  surgeon. Avoid periods of inactivity such as sitting longer than an hour when not asleep. This helps prevent blood clots.  You may return to work once you are authorized by your doctor.   WEIGHT BEARING: Weight bearing as tolerated with assist device (walker, cane, etc) as directed, use it as long as suggested by your surgeon or therapist, typically at least 4-6 weeks.  EXERCISES  Results after joint replacement surgery are often greatly improved when you follow the exercise, range of motion and muscle strengthening exercises prescribed by your doctor. Safety measures are also important to protect the joint from further injury. Any time any of these exercises cause you to have increased pain or swelling, decrease what you are doing until you are comfortable again and then slowly increase them. If you have problems or questions, call your caregiver or physical therapist for advice.   Rehabilitation is important following a joint replacement. After just a few days of immobilization, the muscles of the leg can become weakened and shrink (atrophy).  These exercises are designed to build up the tone and strength of the thigh and leg muscles and to improve motion. Often times heat used for twenty to thirty minutes before working out will loosen up your tissues and help with improving the range of motion but do not use heat for the first two weeks following surgery (sometimes heat can increase post-operative swelling).   These exercises can be done  on a training (exercise) mat, on the floor, on a table or on a bed. Use whatever works the best and is most comfortable for you.    Use music or television while you are exercising so that the exercises are a pleasant break in your day. This will make your life better with the exercises acting as a break in your routine that you can look forward to.   Perform all exercises about fifteen times, three times per day or as directed.  You should exercise both the  operative leg and the other leg as well.  Exercises include:   Quad Sets - Tighten up the muscle on the front of the thigh (Quad) and hold for 5-10 seconds.   Straight Leg Raises - With your knee straight (if you were given a brace, keep it on), lift the leg to 60 degrees, hold for 3 seconds, and slowly lower the leg.  Perform this exercise against resistance later as your leg gets stronger.  Leg Slides: Lying on your back, slowly slide your foot toward your buttocks, bending your knee up off the floor (only go as far as is comfortable). Then slowly slide your foot back down until your leg is flat on the floor again.  Angel Wings: Lying on your back spread your legs to the side as far apart as you can without causing discomfort.  Hamstring Strength:  Lying on your back, push your heel against the floor with your leg straight by tightening up the muscles of your buttocks.  Repeat, but this time bend your knee to a comfortable angle, and push your heel against the floor.  You may put a pillow under the heel to make it more comfortable if necessary.   A rehabilitation program following joint replacement surgery can speed recovery and prevent re-injury in the future due to weakened muscles. Contact your doctor or a physical therapist for more information on knee rehabilitation.   CONSTIPATION:  Constipation is defined medically as fewer than three stools per week and severe constipation as less than one stool per week.  Even if you have a regular bowel pattern at home, your normal regimen is likely to be disrupted due to multiple reasons following surgery.  Combination of anesthesia, postoperative narcotics, change in appetite and fluid intake all can affect your bowels.   YOU MUST use at least one of the following options; they are listed in order of increasing strength to get the job done.  They are all available over the counter, and you may need to use some, POSSIBLY even all of these options:     Drink plenty of fluids (prune juice may be helpful) and high fiber foods Colace 100 mg by mouth twice a day  Senokot for constipation as directed and as needed Dulcolax (bisacodyl), take with full glass of water   Miralax (polyethylene glycol) once or twice a day as needed.  If you have tried all these things and are unable to have a bowel movement in the first 3-4 days after surgery call either your surgeon or your primary doctor.    If you experience loose stools or diarrhea, hold the medications until you stool forms back up.  If your symptoms do not get better within 1 week or if they get worse, check with your doctor.  If you experience the worst abdominal pain ever or develop nausea or vomiting, please contact the office immediately for further recommendations for treatment.  ITCHING:  If you experience itching with  your medications, try taking only a single pain pill, or even half a pain pill at a time.  You can also use Benadryl  over the counter for itching or also to help with sleep.   TED HOSE STOCKINGS:  Use stockings on both legs until for at least 2 weeks or as directed by physician office. They may be removed at night for sleeping.  MEDICATIONS:  See your medication summary on the "After Visit Summary" that nursing will review with you.  You may have some home medications which will be placed on hold until you complete the course of blood thinner medication.  It is important for you to complete the blood thinner medication as prescribed.  Blood clot prevention (DVT Prophylaxis): After surgery you are at an increased risk for a blood clot.  You were prescribed a blood thinner, Xarelto 10mg , to be taken once daily for a total of 4 weeks from surgery to help reduce your risk of getting a blood clot.  Signs of a pulmonary embolus (blood clot in the lungs) include sudden short of breath, feeling lightheaded or dizzy, chest pain with a deep breath, rapid pulse rapid breathing.  Signs of a  blood clot in your arms or legs include new unexplained swelling and cramping, warm, red or darkened skin around the painful area.  Please call the office or 911 right away if these signs or symptoms develop.  PRECAUTIONS:   If you experience chest pain or shortness of breath - call 911 immediately for transfer to the hospital emergency department.   If you develop a fever greater that 101 F, purulent drainage from wound, increased redness or drainage from wound, foul odor from the wound/dressing, or calf pain - CONTACT YOUR SURGEON.                                                   FOLLOW-UP APPOINTMENTS:  If you do not already have a post-op appointment, please call the office for an appointment to be seen by your surgeon.  Guidelines for how soon to be seen are listed in your "After Visit Summary", but are typically between 2-3 weeks after surgery.  If you have a specialized bandage, you may be told to follow up 1 week after surgery.  OTHER INSTRUCTIONS:  Knee Replacement:  Do not place pillow under knee, focus on keeping the knee straight while resting.  Place foam block, curve side up under heel at all times except when walking.  DO NOT modify, tear, cut, or change the foam block in any way.  POST-OPERATIVE OPIOID TAPER INSTRUCTIONS: It is important to wean off of your opioid medication as soon as possible. If you do not need pain medication after your surgery it is ok to stop day one. Opioids include: Codeine, Hydrocodone (Norco, Vicodin), Oxycodone (Percocet, oxycontin ) and hydromorphone  amongst others.  Long term and even short term use of opiods can cause: Increased pain response Dependence Constipation Depression Respiratory depression And more.  Withdrawal symptoms can include Flu like symptoms Nausea, vomiting And more Techniques to manage these symptoms Hydrate well Eat regular healthy meals Stay active Use relaxation techniques(deep breathing, meditating, yoga) Do Not  substitute Alcohol to help with tapering If you have been on opioids for less than two weeks and do not have pain than it is ok to stop all together.  Plan  to wean off of opioids This plan should start within one week post op of your joint replacement. Maintain the same interval or time between taking each dose and first decrease the dose.  Cut the total daily intake of opioids by one tablet each day Next start to increase the time between doses. The last dose that should be eliminated is the evening dose.   MAKE SURE YOU:  Understand these instructions.  Get help right away if you are not doing well or get worse.    Thank you for letting us  be a part of your medical care team.  It is a privilege we respect greatly.  We hope these instructions will help you stay on track for a fast and full recovery!    Information on my medicine - XARELTO (Rivaroxaban)     Why was Xarelto prescribed for you? Xarelto was prescribed for you to reduce the risk of blood clots forming after orthopedic surgery. The medical term for these abnormal blood clots is venous thromboembolism (VTE).  What do you need to know about xarelto ? Take your Xarelto ONCE DAILY at the same time every day. You may take it either with or without food.  If you have difficulty swallowing the tablet whole, you may crush it and mix in applesauce just prior to taking your dose.  Take Xarelto exactly as prescribed by your doctor and DO NOT stop taking Xarelto without talking to the doctor who prescribed the medication.  Stopping without other VTE prevention medication to take the place of Xarelto may increase your risk of developing a clot.  After discharge, you should have regular check-up appointments with your healthcare provider that is prescribing your Xarelto.    What do you do if you miss a dose? If you miss a dose, take it as soon as you remember on the same day then continue your regularly scheduled once daily  regimen the next day. Do not take two doses of Xarelto on the same day.   Important Safety Information A possible side effect of Xarelto is bleeding. You should call your healthcare provider right away if you experience any of the following: Bleeding from an injury or your nose that does not stop. Unusual colored urine (red or dark brown) or unusual colored stools (red or black). Unusual bruising for unknown reasons. A serious fall or if you hit your head (even if there is no bleeding).  Some medicines may interact with Xarelto and might increase your risk of bleeding while on Xarelto. To help avoid this, consult your healthcare provider or pharmacist prior to using any new prescription or non-prescription medications, including herbals, vitamins, non-steroidal anti-inflammatory drugs (NSAIDs) and supplements.  This website has more information on Xarelto: VisitDestination.com.br.

## 2023-10-27 NOTE — Plan of Care (Signed)
   Problem: Education: Goal: Knowledge of General Education information will improve Description: Including pain rating scale, medication(s)/side effects and non-pharmacologic comfort measures Outcome: Progressing   Problem: Coping: Goal: Level of anxiety will decrease Outcome: Progressing

## 2023-10-27 NOTE — Interval H&P Note (Signed)
 The patient has been re-examined, and the chart reviewed, and there have been no interval changes to the documented history and physical.    Plan for Left knee revision synovectomy and poly exchange for instability  The operative side was examined and the patient was confirmed to have sensation to DPN, SPN, TN intact, Motor EHL, ext, flex 5/5, and DP 2+, PT 2+, No significant edema.   The risks, benefits, and alternatives have been discussed at length with patient, and the patient is willing to proceed.  Left knee marked. Consent has been signed.

## 2023-10-27 NOTE — Evaluation (Signed)
 Physical Therapy Evaluation Patient Details Name: Jonathan Dyer MRN: [id number] DOB: [birth year] Today's Date: 10/27/2023  History of Present Illness  68 yo male s/p L TK revision, poly exchangem on 10/27/23. PMH: prostate ca, NMD,CAD, Watchman device, afib.  Clinical Impression  The patient tolerated well, ambulated x 100' using Rw. Patient  has 4 STE ,so will practice prior to Dc. Pt admitted with above diagnosis.  Pt currently with functional limitations due to the deficits listed below (see PT Problem List). Pt will benefit from acute skilled PT to increase their independence and safety with mobility to allow discharge.           If plan is discharge home, recommend the following: A little help with walking and/or transfers;Help with stairs or ramp for entrance   Can travel by private vehicle        Equipment Recommendations Rolling walker (2 wheels)  Recommendations for Other Services       Functional Status Assessment Patient has had a recent decline in their functional status and demonstrates the ability to make significant improvements in function in a reasonable and predictable amount of time.     Precautions / Restrictions Precautions Precautions: Fall;Knee Precaution/Restrictions Comments: VAC      Mobility  Bed Mobility Overal bed mobility: Needs Assistance Bed Mobility: Supine to Sit     Supine to sit: Supervision          Transfers Overall transfer level: Needs assistance Equipment used: Rolling walker (2 wheels) Transfers: Sit to/from Stand Sit to Stand: Min assist           General transfer comment: cues for hand and leg position    Ambulation/Gait Ambulation/Gait assistance: Min assist Gait Distance (Feet): 100 Feet Assistive device: Rolling walker (2 wheels) Gait Pattern/deviations: Step-to pattern, Step-through pattern       General Gait Details: cues for sequence  Stairs            Wheelchair Mobility     Tilt Bed     Modified Rankin (Stroke Patients Only)       Balance Overall balance assessment: Mild deficits observed, not formally tested                                           Pertinent Vitals/Pain Pain Assessment Pain Assessment: 0-10 Pain Score: 2  Pain Location: l knee Pain Descriptors / Indicators: Discomfort Pain Intervention(s): Monitored during session, Premedicated before session, Ice applied    Home Living Family/patient expects to be discharged to:: Private residence Living Arrangements: Alone Available Help at Discharge: Family;Available 24 hours/day Type of Home: House Home Access: Stairs to enter Entrance Stairs-Rails: None     Home Layout: One level Home Equipment: None      Prior Function Prior Level of Function : Independent/Modified Independent;Driving             Mobility Comments: bike races       Extremity/Trunk Assessment   Upper Extremity Assessment Upper Extremity Assessment: Overall WFL for tasks assessed    Lower Extremity Assessment Lower Extremity Assessment: LLE deficits/detail LLE Deficits / Details: + SLR, knee flex to ~ 50    Cervical / Trunk Assessment Cervical / Trunk Assessment: Normal  Communication   Communication Communication: No apparent difficulties    Cognition Arousal: Alert Behavior During Therapy: WFL for tasks assessed/performed   PT - Cognitive impairments: No  apparent impairments                         Following commands: Intact       Cueing       General Comments      Exercises Total Joint Exercises Quad Sets: AROM, Both, 10 reps   Assessment/Plan    PT Assessment Patient needs continued PT services  PT Problem List Decreased strength;Decreased activity tolerance;Decreased mobility;Decreased range of motion;Decreased safety awareness       PT Treatment Interventions DME instruction;Therapeutic activities;Gait training;Functional mobility training;Therapeutic  exercise;Stair training;Patient/family education    PT Goals (Current goals can be found in the Care Plan section)  Acute Rehab PT Goals Patient Stated Goal: go home, bike race PT Goal Formulation: With patient Time For Goal Achievement: 11/03/23 Potential to Achieve Goals: Good    Frequency 7X/week     Co-evaluation               AM-PAC PT 6 Clicks Mobility  Outcome Measure Help needed turning from your back to your side while in a flat bed without using bedrails?: None Help needed moving from lying on your back to sitting on the side of a flat bed without using bedrails?: None Help needed moving to and from a bed to a chair (including a wheelchair)?: A Little Help needed standing up from a chair using your arms (e.g., wheelchair or bedside chair)?: A Little Help needed to walk in hospital room?: A Little Help needed climbing 3-5 steps with a railing? : A Lot 6 Click Score: 19    End of Session Equipment Utilized During Treatment: Gait belt Activity Tolerance: Patient tolerated treatment well Patient left: in chair Nurse Communication: Mobility status PT Visit Diagnosis: Unsteadiness on feet (R26.81);Muscle weakness (generalized) (M62.81);Pain Pain - Right/Left: Left Pain - part of body: Knee    Time: 6295-2841 PT Time Calculation (min) (ACUTE ONLY): 23 min   Charges:   PT Evaluation $PT Eval Low Complexity: 1 Low PT Treatments $Gait Training: 8-22 mins PT General Charges $$ ACUTE PT VISIT: 1 Visit         Abelina Hoes PT Acute Rehabilitation Services Office [phone]   Jonathan Dyer 10/27/2023, 4:46 PM

## 2023-10-27 NOTE — Anesthesia Procedure Notes (Signed)
 Procedure Name: LMA Insertion Date/Time: 10/27/2023 8:37 AM  Performed by: Darlena Ego, CRNAPre-anesthesia Checklist: Patient identified, Emergency Drugs available, Suction available and Patient being monitored Patient Re-evaluated:Patient Re-evaluated prior to induction Oxygen Delivery Method: Circle System Utilized Preoxygenation: Pre-oxygenation with 100% oxygen Induction Type: IV induction Ventilation: Mask ventilation without difficulty LMA: LMA inserted LMA Size: 5.0 Number of attempts: 1 Airway Equipment and Method: Bite block Placement Confirmation: positive ETCO2 Tube secured with: Tape Dental Injury: Teeth and Oropharynx as per pre-operative assessment

## 2023-10-27 NOTE — OR Nursing (Signed)
 Implant going home with patient.

## 2023-10-27 NOTE — Anesthesia Procedure Notes (Signed)
 Anesthesia Regional Block: Adductor canal block   Pre-Anesthetic Checklist: , timeout performed,  Correct Patient, Correct Site, Correct Laterality,  Correct Procedure, Correct Position, site marked,  Risks and benefits discussed,  Surgical consent,  Pre-op evaluation,  At surgeon's request and post-op pain management  Laterality: Left  Prep: chloraprep       Needles:  Injection technique: Single-shot  Needle Type: Echogenic Needle     Needle Length: 9cm  Needle Gauge: 21     Additional Needles:   Procedures:,,,, ultrasound used (permanent image in chart),,    Narrative:  Start time: 10/27/2023 8:10 AM End time: 10/27/2023 8:16 AM Injection made incrementally with aspirations every 5 mL.  Performed by: Personally  Anesthesiologist: Peggy Bowens, MD

## 2023-10-27 NOTE — Progress Notes (Signed)
 Orthopedic Tech Progress Note Patient Details:  Jonathan Dyer 1956/02/22 [id number]  Ortho Devices Type of Ortho Device: Bone foam zero knee Ortho Device/Splint Location: left Ortho Device/Splint Interventions: Ordered, Application, Adjustment   Post Interventions Patient Tolerated: Well Instructions Provided: Adjustment of device, Care of device  Leodis Rainwater 10/27/2023, 10:43 AM

## 2023-10-27 NOTE — Transfer of Care (Signed)
 Immediate Anesthesia Transfer of Care Note  Patient: Jonathan Dyer  Procedure(s) Performed: IRRIGATION AND DEBRIDEMENT KNEE WITH POLY EXCHANGE (Left: Knee)  Patient Location: PACU  Anesthesia Type:General and Regional  Level of Consciousness: sedated and responds to stimulation  Airway & Oxygen Therapy: Patient Spontanous Breathing and Patient connected to face mask oxygen  Post-op Assessment: Report given to RN and Post -op Vital signs reviewed and stable  Post vital signs: Reviewed and stable  Last Vitals:  Vitals Value Taken Time  BP 119/63 10/27/23 10:39  Temp    Pulse 57 10/27/23 10:40  Resp 11 10/27/23 10:40  SpO2 95 % 10/27/23 10:40  Vitals shown include unfiled device data.  Last Pain:  Vitals:   10/27/23 0711  TempSrc:   PainSc: 0-No pain         Complications: No notable events documented.

## 2023-10-27 NOTE — Op Note (Signed)
 DATE OF SURGERY:  10/27/2023 TIME: 10:05 AM  PATIENT NAME:  Jonathan Dyer   AGE: 68 y.o.   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   OPERATIVE IMPLANTS:  DePuy Sigma size 5 tibial rotating platform 12.5 mm thickness polyethylene insert Implant Name Type Inv. Item Serial No. Manufacturer Lot No. LRB No. Used Action  PLATE ROT INSERT 12. - [id number] Plate PLATE ROT INSERT 12.5MM  [employer] [id number] Left 1 Implanted      PREOPERATIVE INDICATIONS:  Artemis Jasyah Theurer is a 68 y.o. year old male who had undergone left total knee arthroplasty with Dr. Hazeline Lister 15 years ago.  Had done well after surgery however more recently having issues with feelings of instability in the knee.  When episodes of instability happen he has severe sharp pain to significant difficulty walking for a couple of hours.  Episodes are unpredictable and affecting his daily life and function at this point.  When not having an episode of instability his left knee feels fine and is able to perform at a high level of function.  Notably has hyperextension preoperatively.  Given these findings I felt he would benefit from an synovectomy liner exchange to upsize his liner to prevent future episodes of buckling or instability and allow him to return to his prior quality of life.   The risks, benefits, and alternatives were discussed at length including but not limited to the risks of infection, bleeding, nerve injury, stiffness, blood clots, the need for revision surgery, cardiopulmonary complications, among others, and they were willing to  proceed.  OPERATIVE FINDINGS AND UNIQUE ASPECTS OF THE CASE: 5 degrees of preoperative hyperextension to about 120 degrees of flexion once under anesthesia, there was no signs or evidence of infection in the knee, there was significant fibrosis around the patella that was excised, there was scar tissue in the suprapatellar pouch blocking some of his flexion.  Additionally, there was a large overgrown anterior lateral tibial osteophyte that was excised.  There was no obvious loosening of the femoral tibial components and patella was tracking midline in the trochlea.  Once the poly was removed there was found to be some medial wear and delamination, but otherwise no obvious poly failure  ESTIMATED BLOOD LOSS: 50cc  OPERATIVE DESCRIPTION:   Once adequate anesthesia was induced, preoperative antibiotics, 3 gm of ancef,1 gm of Tranexamic Acid, and 8 mg of Decadron  administered, the patient was positioned supine with a left thigh tourniquet placed.  The left lower extremity was prepped and draped in sterile fashion.  A time-  out was performed identifying the patient, planned procedure, and the appropriate extremity.     The leg was  exsanguinated, tourniquet elevated to 300 mmHg.  A midline incision was  made utilizing the patient's old incision followed by median parapatellar arthrotomy. . A medial release was performed, a complete synovectomy with me was performed circumferentially allowing for improved mobilization of the patella as well as clearing some of the fibrotic tissue develop circumferentially around the patella.  The patellar component appeared well-fixed as well as the femoral and tibial components.  Once adequately mobilized the knee was flexed and retractors were placed.  The old poly component was able  to be removed without difficulty.  Upon examination there was relatively mild medial wear and delamination.  Once the poly was removed we found that there was some osteophyte formation along  the lateral and anterior lateral aspects of the tibial baseplate.  Using an osteotome these were carefully removed.  Next we trialed a 12.5 mm insert this had much improved stability with varus valgus stress, full extension, and 120 degrees of flexion with about 3 to 4 mm of anterior posterior play at 90 degrees of flexion.  I was pleased with the stability this component.  The the trial was then removed and knee was then thoroughly irrigated with Prontosan irrigation solution and pulse lavage normal saline.  The real polyethylene insert was opened and then placed into the knee.        The tourniquet had been let down.  No significant hemostasis was required.  The medial parapatellar arthrotomy was then reapproximated using #1 Vicryl and #1 Stratafix sutures with the knee  in flexion.  The remaining wound was closed with 0 stratafix, 2-0 Vicryl, and running 3-0 Nylon. The knee was cleaned, dried, dressed sterilely using Dermabond and Prevena incisional wound VAC dressing was placed.  The patient was then brought to recovery room in stable condition, tolerating the procedure  well. There were no complications.   Post op recs: WB: WBAT Abx: ancef Imaging: PACU xrays DVT prophylaxis: Xarelto 10 mg stariting POD1 Follow up: 2 weeks after surgery for a wound check with Dr. Pryor Browning at [facility].  Address: [address], [place], [place] [address]  Office Phone: [phone]  Priscille Brought, MD Orthopaedic Surgery

## 2023-10-27 NOTE — Anesthesia Postprocedure Evaluation (Signed)
 Anesthesia Post Note  Patient: Jonathan Dyer  Procedure(s) Performed: IRRIGATION AND DEBRIDEMENT KNEE WITH POLY EXCHANGE (Left: Knee)     Patient location during evaluation: PACU Anesthesia Type: General Level of consciousness: awake and alert Pain management: pain level controlled Vital Signs Assessment: post-procedure vital signs reviewed and stable Respiratory status: spontaneous breathing, nonlabored ventilation, respiratory function stable and patient connected to nasal cannula oxygen Cardiovascular status: blood pressure returned to baseline and stable Postop Assessment: no apparent nausea or vomiting Anesthetic complications: no  No notable events documented.  Last Vitals:  Vitals:   10/27/23 1145 10/27/23 1200  BP: 128/64 128/64  Pulse: (!) 59 (!) 56  Resp: 16 13  Temp:  (!) 36.3 C  SpO2: 92% 96%    Last Pain:  Vitals:   10/27/23 1200  TempSrc:   PainSc: Asleep                 Melvenia Stabs

## 2023-10-27 NOTE — Plan of Care (Signed)
   Problem: Education: Goal: Knowledge of General Education information will improve Description Including pain rating scale, medication(s)/side effects and non-pharmacologic comfort measures Outcome: Progressing   Problem: Health Behavior/Discharge Planning: Goal: Ability to manage health-related needs will improve Outcome: Progressing

## 2023-10-28 ENCOUNTER — Encounter (HOSPITAL_COMMUNITY): Payer: Self-pay | Admitting: Orthopedic Surgery

## 2023-10-28 ENCOUNTER — Other Ambulatory Visit (HOSPITAL_COMMUNITY): Payer: Self-pay

## 2023-10-28 LAB — BASIC METABOLIC PANEL WITH GFR
Anion gap: 10 (ref 5–15)
BUN: 18 mg/dL (ref 8–23)
CO2: 23 mmol/L (ref 22–32)
Calcium: 8.3 mg/dL — ABNORMAL LOW (ref 8.9–10.3)
Chloride: 104 mmol/L (ref 98–111)
Creatinine, Ser: 1.45 mg/dL — ABNORMAL HIGH (ref 0.61–1.24)
GFR, Estimated: 52 mL/min — ABNORMAL LOW (ref >60)
Glucose, Bld: 143 mg/dL — ABNORMAL HIGH (ref 70–99)
Potassium: 4.1 mmol/L (ref 3.5–5.1)
Sodium: 137 mmol/L (ref 135–145)

## 2023-10-28 LAB — CBC
HCT: 39.4 % (ref 39.0–52.0)
Hemoglobin: 13.2 g/dL (ref 13.0–17.0)
MCH: 30.1 pg (ref 26.0–34.0)
MCHC: 33.5 g/dL (ref 30.0–36.0)
MCV: 89.7 fL (ref 80.0–100.0)
Platelets: 251 10*3/uL (ref 150–400)
RBC: 4.39 MIL/uL (ref 4.22–5.81)
RDW: 11.9 % (ref 11.5–15.5)
WBC: 12 10*3/uL — ABNORMAL HIGH (ref 4.0–10.5)
nRBC: 0 % (ref 0.0–0.2)

## 2023-10-28 MED ORDER — RIVAROXABAN 10 MG PO TABS
10.0000 mg | ORAL_TABLET | Freq: Every day | ORAL | 0 refills | Status: DC
  Filled 2023-10-28: qty 28, 28d supply, fill #0

## 2023-10-28 NOTE — TOC Transition Note (Signed)
 Transition of Care [facility]) - Discharge Note   Patient Details  Name: Jonathan Dyer MRN: [id number] Date of Birth: 24-Jan-1956  Transition of [facility]) CM/SW Contact:  Delilah Fend, LCSW Phone Number: 10/28/2023, 10:44 AM   Clinical Narrative:     Met with patient and confirming he has received RW to room via [facility].  Noted that pt's chart indicates HHPT set up with [facility] but pt reports he plans on cancelling this.  No further TOC needs.  Final next level of care: Home w Home Health Services Barriers to Discharge: No Barriers Identified   Patient Goals and CMS Choice Patient states their goals for this hospitalization and ongoing recovery are:: return home          Discharge Placement                       Discharge Plan and Services Additional resources added to the After Visit Summary for                  DME Arranged: Walker rolling DME Agency: [employer]       HH Arranged: PT HH Agency: [facility] ([facility])        Social Drivers of Health (SDOH) Interventions SDOH Screenings   Food Insecurity: No Food Insecurity (10/27/2023)  Housing: Patient Declined (10/27/2023)  Transportation Needs: No Transportation Needs (10/27/2023)  Utilities: Not At Risk (10/27/2023)  Alcohol Screen: Low Risk  (02/16/2023)  Depression (PHQ2-9): Low Risk  (07/27/2023)  Financial Resource Strain: Low Risk  (02/16/2023)  Physical Activity: Sufficiently Active (02/16/2023)  Social Connections: Moderately Integrated (10/27/2023)  Stress: No Stress Concern Present (02/16/2023)  Tobacco Use: Low Risk  (10/27/2023)  Health Literacy: Adequate Health Literacy (02/16/2023)     Readmission Risk Interventions     No data to display

## 2023-10-28 NOTE — Progress Notes (Signed)
     Subjective: Patient reports pain as mild.  Mobilized well with physical therapy yesterday ambulating 164feet.  Anticipate discharge home today.  His daughter is coming from [place]. Dnies distal n/t. All questions answered.  Objective:   VITALS:   Vitals:   10/27/23 2104 10/27/23 2300 10/28/23 0122 10/28/23 0500  BP: (!) 169/90 (!) 156/75 138/77 (!) 144/73  Pulse: 60  (!) 57 (!) 58  Resp: 18  18 18   Temp: (!) 97.5 F (36.4 C)  97.8 F (36.6 C) 97.6 F (36.4 C)  TempSrc: Oral  Oral Oral  SpO2: 97%  94% 97%  Weight:      Height:        Neurovascular intact Sensation intact distally Intact pulses distally Dorsiflexion/Plantar flexion intact Incision: dressing C/D/I Compartment soft Wound vac holding suction no output in cannister.  Lab Results  Component Value Date   WBC 12.0 (H) 10/28/2023   HGB 13.2 10/28/2023   HCT 39.4 10/28/2023   MCV 89.7 10/28/2023   PLT 251 10/28/2023   BMET    Component Value Date/Time   NA 137 10/28/2023 0331   NA 143 06/23/2023 1055   K 4.1 10/28/2023 0331   CL 104 10/28/2023 0331   CO2 23 10/28/2023 0331   GLUCOSE 143 (H) 10/28/2023 0331   BUN 18 10/28/2023 0331   BUN 19 06/23/2023 1055   CREATININE 1.45 (H) 10/28/2023 0331   CREATININE 1.34 (H) 10/31/2020 0946   CALCIUM  8.3 (L) 10/28/2023 0331   EGFR 53 (L) 06/23/2023 1055   GFRNONAA 52 (L) 10/28/2023 0331   GFRNONAA 54 (L) 10/31/2020 0946      Xray: Total knee arthroplasty components good position no adverse features.  Assessment/Plan: 1 Day Post-Op   Principal Problem:   Failed total knee replacement (HCC)   Status  post left knee revision synovectomy and liner exchnage for instability 10/27/23  Post op recs: WB: WBAT Abx: ancef Imaging: PACU xrays DVT prophylaxis: Xarelto 10 mg stariting POD1 Follow up: 2 weeks after surgery for a wound check with Dr. Pryor Browning at [facility].  Address: [address], [place], [place] [address]   Office Phone: [phone]   Priscille Brought, MD Orthopaedic Surgery     Jonathan Dyer A Jonathan Dyer 10/28/2023, 6:45 AM   Priscille Brought, MD  Contact information:   [phone] 7am-5pm epic message Dr. Pryor Browning, or call office for patient follow up: [phone] After hours and holidays please check [email or web address] for group call information for [facility]

## 2023-10-28 NOTE — Progress Notes (Signed)
 Physical Therapy Treatment Patient Details Name: Jonathan Dyer MRN: [id number] DOB: [birth year] Today's Date: 10/28/2023   History of Present Illness 68 yo male s/p L TK revision, poly exchangem on 10/27/23. PMH: prostate ca, NMD,CAD, Watchman device, afib.    PT Comments  The patient reports aching in the left knee, RN notified. Patient negotiated steps using crutches and ambulated x 220' using crutches. Patient has  met PT goals for DC today.    If plan is discharge home, recommend the following: A little help with walking and/or transfers;Help with stairs or ramp for entrance   Can travel by private vehicle        Equipment Recommendations  Rolling walker (2 wheels)    Recommendations for Other Services       Precautions / Restrictions Precautions Precautions: Fall;Knee Precaution/Restrictions Comments: VAC Restrictions LLE Weight Bearing Per Provider Order: Weight bearing as tolerated     Mobility  Bed Mobility               General bed mobility comments: independent    Transfers Overall transfer level: Needs assistance Equipment used: Rolling walker (2 wheels) Transfers: Sit to/from Stand Sit to Stand: Modified independent (Device/Increase time)                Ambulation/Gait Ambulation/Gait assistance: Supervision Gait Distance (Feet): 220 Feet Assistive device: Crutches Gait Pattern/deviations: Step-through pattern       General Gait Details: pt. able to progress ambulation with crutches after practiced steps   Stairs Stairs: Yes Stairs assistance: Supervision Stair Management: Forwards, With crutches Number of Stairs: 2 General stair comments: patient performed well  with crutches   Wheelchair Mobility     Tilt Bed    Modified Rankin (Stroke Patients Only)       Balance Overall balance assessment: Mild deficits observed, not formally tested                                          Communication  Communication Communication: No apparent difficulties  Cognition Arousal: Alert Behavior During Therapy: WFL for tasks assessed/performed   PT - Cognitive impairments: No apparent impairments                                Cueing    Exercises Total Joint Exercises Quad Sets: AROM, Both, 10 reps Heel Slides: AROM, Left Straight Leg Raises: AAROM, Left, 10 reps Long Arc Quad: AROM, Left, 10 reps Goniometric ROM: 10-70 left knee flex    General Comments        Pertinent Vitals/Pain Pain Assessment Pain Score: 4  Pain Location: l knee Pain Descriptors / Indicators: Aching Pain Intervention(s): Monitored during session, Patient requesting pain meds-RN notified    Home Living                          Prior Function            PT Goals (current goals can now be found in the care plan section) Progress towards PT goals: Progressing toward goals    Frequency    7X/week      PT Plan      Co-evaluation              AM-PAC PT 6 Clicks Mobility   Outcome Measure  Help needed turning from your back to your side while in a flat bed without using bedrails?: None Help needed moving from lying on your back to sitting on the side of a flat bed without using bedrails?: None Help needed moving to and from a bed to a chair (including a wheelchair)?: None Help needed standing up from a chair using your arms (e.g., wheelchair or bedside chair)?: A Little Help needed to walk in hospital room?: A Little Help needed climbing 3-5 steps with a railing? : A Little 6 Click Score: 21    End of Session Equipment Utilized During Treatment: Gait belt Activity Tolerance: Patient tolerated treatment well Patient left: in chair Nurse Communication: Mobility status PT Visit Diagnosis: Unsteadiness on feet (R26.81);Muscle weakness (generalized) (M62.81);Pain Pain - Right/Left: Left Pain - part of body: Knee     Time: 0454-0981 PT Time Calculation (min)  (ACUTE ONLY): 31 min  Charges:    $Gait Training: 8-22 mins $Therapeutic Exercise: 8-22 mins PT General Charges $$ ACUTE PT VISIT: 1 Visit                    Abelina Hoes PT Acute Rehabilitation Services Office [phone]   Dareen Ebbing 10/28/2023, 1:31 PM

## 2023-10-28 NOTE — Plan of Care (Signed)

## 2023-10-28 NOTE — Progress Notes (Signed)
 TOC med in a secure cag delivered to pt in room by this RN

## 2023-10-29 NOTE — Discharge Summary (Signed)
 Physician Discharge Summary  [name] MRN: [id number] DOB/AGE: [birth year] 70 y.o.  Admit date: 10/27/2023 Discharge date: 10/28/23  Admission Diagnoses:  Failed total knee replacement [facility])  Discharge Diagnoses:  Principal Problem:   Failed total knee replacement [facility])   Past Medical History:  Diagnosis Date   Allergy    Arthritis    Atrial fibrillation (HCC)    Diagnosed 09/2009, on flecainide    Back pain    Bilateral swelling of feet    Bradycardia    Event monitor 2010: HR down to 45, asymptomatic.   Cataract    Coronary atherosclerosis of native coronary artery    Nonobstructive and distal disease by cath 2005.    Difficult intubation    Difficult airway noted pt. unaware   Dysrhythmia    a-fib   Essential hypertension    GERD (gastroesophageal reflux disease)    occasional   Headache    migraine affects vision   Hyperlipidemia    Joint pain    Neuromuscular disorder (HCC)    Sciatica   OSA (obstructive sleep apnea)    Osteoarthritis    Overweight    Pre-diabetes    Prediabetes    Presence of Watchman left atrial appendage closure device 02/04/2023   Watchman 27mm FLX Pro placed by Dr. Marven Slimmer   Prostate cancer [facility]) 05/2022   following via biopsy   Renal cyst    Ringing in ears    Sleep apnea    borderline no cpap   Vitamin D  deficiency     Surgeries: Procedure(s): IRRIGATION AND DEBRIDEMENT KNEE WITH POLY EXCHANGE on 10/27/2023   Consultants (if any):   Discharged Condition: Improved  Hospital Course: Jonathan Dyer is an 68 y.o. male who was admitted 10/27/2023 with a diagnosis of Failed total knee replacement (HCC) and went to the operating room on 10/27/2023 and underwent the above named procedures.    He was given perioperative antibiotics:  Anti-infectives (From admission, onward)    Start     Dose/Rate Route Frequency Ordered Stop   10/27/23 1400  ceFAZolin (ANCEF) IVPB 2g/100 mL premix  Status:  Discontinued         2 g 200 mL/hr over 30 Minutes Intravenous Every 8 hours 10/27/23 1235 10/28/23 2150   10/27/23 0700  ceFAZolin (ANCEF) IVPB 3g/150 mL premix        3 g 300 mL/hr over 30 Minutes Intravenous On call to O.R. 10/27/23 0650 10/27/23 0838   10/27/23 0000  cefadroxil (DURICEF) 500 MG capsule        500 mg Oral 2 times daily 10/27/23 0827 11/03/23 2359     .  He was given sequential compression devices, early ambulation, and xarelto for DVT prophylaxis.  He benefited maximally from the hospital stay and there were no complications.    Recent vital signs:  Vitals:   10/28/23 0914 10/28/23 1625  BP: (!) 153/74 (!) 149/82  Pulse: (!) 50 (!) 47  Resp:    Temp:  97.7 F (36.5 C)  SpO2: 96% 95%    Recent laboratory studies:  Lab Results  Component Value Date   HGB 13.2 10/28/2023   HGB 14.9 10/14/2023   HGB 15.2 09/02/2023   Lab Results  Component Value Date   WBC 12.0 (H) 10/28/2023   PLT 251 10/28/2023   Lab Results  Component Value Date   INR 0.95 03/20/2009   Lab Results  Component Value Date   NA 137 10/28/2023   K  4.1 10/28/2023   CL 104 10/28/2023   CO2 23 10/28/2023   BUN 18 10/28/2023   CREATININE 1.45 (H) 10/28/2023   GLUCOSE 143 (H) 10/28/2023    Discharge Medications:   Allergies as of 10/28/2023       Reactions   Clindamycin/lincomycin Swelling, Other (See Comments)   Mycin drug family    Penicillin G Other (See Comments)   Penicillins Other (See Comments)   Unknown reaction   Pneumococcal 13-val Conj Vacc Swelling   Patient stated that he has severe swelling in his arm from vaccine and swollen up to his collar bone. Lasted about 2 days and did not feel well.         Medication List     STOP taking these medications    aspirin  EC 81 MG tablet       TAKE these medications    acetaminophen  500 MG tablet Commonly known as: TYLENOL  Take 2 tablets (1,000 mg total) by mouth every 8 (eight) hours as needed. What changed:  how much to  take when to take this reasons to take this   amLODipine  10 MG tablet Commonly known as: NORVASC  TAKE 1 TABLET BY MOUTH DAILY   cefadroxil 500 MG capsule Commonly known as: DURICEF Take 1 capsule (500 mg total) by mouth 2 (two) times daily for 7 days.   cephALEXin  500 MG capsule Commonly known as: Keflex  Take 1 capsule (500 mg total) by mouth as needed. Take one tablet by mouth, one hour prior to dental cleanings   Co Q-10 100 MG Caps Take 1 tablet by mouth daily.   flecainide  50 MG tablet Commonly known as: TAMBOCOR  TAKE 1 TABLET BY MOUTH TWICE  DAILY   losartan  100 MG tablet Commonly known as: COZAAR  TAKE 1 TABLET BY MOUTH DAILY   meloxicam 7.5 MG tablet Commonly known as: MOBIC Take 1-2 tablets (7.5-15 mg total) by mouth daily as needed for pain.   methocarbamol 500 MG tablet Commonly known as: ROBAXIN Take 1 tablet (500 mg total) by mouth every 8 (eight) hours as needed for up to 10 days for muscle spasms.   multivitamin tablet Take 1 tablet by mouth daily.   omeprazole  20 MG capsule Commonly known as: PRILOSEC TAKE 1 CAPSULE BY MOUTH DAILY AS NEEDED FOR ACID REFLUX,  INDIGESTION   ondansetron  4 MG tablet Commonly known as: Zofran  Take 1 tablet (4 mg total) by mouth every 8 (eight) hours as needed for up to 14 days for nausea or vomiting.   oxyCODONE  5 MG immediate release tablet Commonly known as: Roxicodone  Take 1 tablet (5 mg total) by mouth every 4 (four) hours as needed for up to 7 days for severe pain (pain score 7-10) or moderate pain (pain score 4-6).   polyethylene glycol 17 g packet Commonly known as: MiraLax Take 17 g by mouth daily.   Repatha  SureClick 140 MG/ML Soaj Generic drug: Evolocumab  Inject 140 mg into the skin every 14 (fourteen) days.   tamsulosin  0.4 MG Caps capsule Commonly known as: FLOMAX  Take 0.4 mg by mouth 2 (two) times a week. Takes occasionally.   Vitamin D  (Ergocalciferol ) 1.25 MG (50000 UNIT) Caps capsule Commonly  known as: DRISDOL  Take 1 capsule (50,000 Units total) by mouth every 7 (seven) days.   Xarelto 10 MG Tabs tablet Generic drug: rivaroxaban Take 1 tablet (10 mg total) by mouth daily.        Diagnostic Studies: DG Knee Left Port Result Date: 10/27/2023 CLINICAL DATA:  Total  left knee arthroplasty. EXAM: PORTABLE LEFT KNEE - 1-2 VIEW COMPARISON:  None Available. FINDINGS: There is diffuse decreased bone mineralization. Status post total left knee arthroplasty. No perihardware lucency is seen to indicate hardware failure or loosening. Expected postoperative changes including intra-articular air and a proximal anterior shin likely the wound VAC. Small joint effusion. Mild high-grade atherosclerotic calcifications. IMPRESSION: Status post total left knee arthroplasty without evidence of hardware failure. Electronically Signed   By: Bertina Broccoli M.D.   On: 10/27/2023 12:25    Disposition: Discharge disposition: 01-Home or Self Care       Discharge Instructions     Call MD / Call 911   Complete by: As directed    If you experience chest pain or shortness of breath, CALL 911 and be transported to the hospital emergency room.  If you develope a fever above 101 F, pus (white drainage) or increased drainage or redness at the wound, or calf pain, call your surgeon's office.   Constipation Prevention   Complete by: As directed    Drink plenty of fluids.  Prune juice may be helpful.  You may use a stool softener, such as Colace (over the counter) 100 mg twice a day.  Use MiraLax (over the counter) for constipation as needed.   Diet - low sodium heart healthy   Complete by: As directed    Increase activity slowly as tolerated   Complete by: As directed    Post-operative opioid taper instructions:   Complete by: As directed    POST-OPERATIVE OPIOID TAPER INSTRUCTIONS: It is important to wean off of your opioid medication as soon as possible. If you do not need pain medication after your surgery  it is ok to stop day one. Opioids include: Codeine, Hydrocodone (Norco, Vicodin), Oxycodone (Percocet, oxycontin ) and hydromorphone  amongst others.  Long term and even short term use of opiods can cause: Increased pain response Dependence Constipation Depression Respiratory depression And more.  Withdrawal symptoms can include Flu like symptoms Nausea, vomiting And more Techniques to manage these symptoms Hydrate well Eat regular healthy meals Stay active Use relaxation techniques(deep breathing, meditating, yoga) Do Not substitute Alcohol to help with tapering If you have been on opioids for less than two weeks and do not have pain than it is ok to stop all together.  Plan to wean off of opioids This plan should start within one week post op of your joint replacement. Maintain the same interval or time between taking each dose and first decrease the dose.  Cut the total daily intake of opioids by one tablet each day Next start to increase the time between doses. The last dose that should be eliminated is the evening dose.           Follow-up Information     Murleen Arms, MD Follow up in 1 week(s).   Specialty: Orthopedic Surgery Contact information: [address] [place] [address] [phone]                    Discharge Instructions      INSTRUCTIONS AFTER JOINT REPLACEMENT   Remove items at home which could result in a fall. This includes throw rugs or furniture in walking pathways ICE to the affected joint every three hours while awake for 30 minutes at a time, for at least the first 3-5 days, and then as needed for pain and swelling.  Continue to use ice for pain and swelling. You may notice swelling that  will progress down to the foot and ankle.  This is normal after surgery.  Elevate your leg when you are not up walking on it.   Continue to use the breathing machine you got in the hospital (incentive spirometer) which will help  keep your temperature down.  It is common for your temperature to cycle up and down following surgery, especially at night when you are not up moving around and exerting yourself.  The breathing machine keeps your lungs expanded and your temperature down.  DIET:  As you were doing prior to hospitalization, we recommend a well-balanced diet.  DRESSING / WOUND CARE / SHOWERING:  Keep the surgical dressing until follow up.  This has battery powered suction to be kept at 125 mmHg and lasts 7 days.  If the battery stops sooner than 7 days, then call our office for further instructions.    The dressing is also water  resistant, however it is best to shower with an extra covering, such as saran wrap to keep it dry.  IF THE DRESSING FALLS OFF or the wound gets wet inside, change the dressing with sterile gauze and call our office for further instruction.  Please use good hand washing techniques before changing the dressing.  Do not use any lotions or creams on the incision until instructed by your surgeon.     ACTIVITY  Increase activity slowly as tolerated, but follow the weight bearing instructions below.   No driving for 6 weeks or until further direction given by your physician.  You cannot drive while taking narcotics.  No lifting or carrying greater than 10 lbs. until further directed by your surgeon. Avoid periods of inactivity such as sitting longer than an hour when not asleep. This helps prevent blood clots.  You may return to work once you are authorized by your doctor.   WEIGHT BEARING: Weight bearing as tolerated with assist device (walker, cane, etc) as directed, use it as long as suggested by your surgeon or therapist, typically at least 4-6 weeks.  EXERCISES  Results after joint replacement surgery are often greatly improved when you follow the exercise, range of motion and muscle strengthening exercises prescribed by your doctor. Safety measures are also important to protect the joint  from further injury. Any time any of these exercises cause you to have increased pain or swelling, decrease what you are doing until you are comfortable again and then slowly increase them. If you have problems or questions, call your caregiver or physical therapist for advice.   Rehabilitation is important following a joint replacement. After just a few days of immobilization, the muscles of the leg can become weakened and shrink (atrophy).  These exercises are designed to build up the tone and strength of the thigh and leg muscles and to improve motion. Often times heat used for twenty to thirty minutes before working out will loosen up your tissues and help with improving the range of motion but do not use heat for the first two weeks following surgery (sometimes heat can increase post-operative swelling).   These exercises can be done on a training (exercise) mat, on the floor, on a table or on a bed. Use whatever works the best and is most comfortable for you.    Use music or television while you are exercising so that the exercises are a pleasant break in your day. This will make your life better with the exercises acting as a break in your routine that you can look forward  to.   Perform all exercises about fifteen times, three times per day or as directed.  You should exercise both the operative leg and the other leg as well.  Exercises include:   Quad Sets - Tighten up the muscle on the front of the thigh (Quad) and hold for 5-10 seconds.   Straight Leg Raises - With your knee straight (if you were given a brace, keep it on), lift the leg to 60 degrees, hold for 3 seconds, and slowly lower the leg.  Perform this exercise against resistance later as your leg gets stronger.  Leg Slides: Lying on your back, slowly slide your foot toward your buttocks, bending your knee up off the floor (only go as far as is comfortable). Then slowly slide your foot back down until your leg is flat on the floor again.   Angel Wings: Lying on your back spread your legs to the side as far apart as you can without causing discomfort.  Hamstring Strength:  Lying on your back, push your heel against the floor with your leg straight by tightening up the muscles of your buttocks.  Repeat, but this time bend your knee to a comfortable angle, and push your heel against the floor.  You may put a pillow under the heel to make it more comfortable if necessary.   A rehabilitation program following joint replacement surgery can speed recovery and prevent re-injury in the future due to weakened muscles. Contact your doctor or a physical therapist for more information on knee rehabilitation.   CONSTIPATION:  Constipation is defined medically as fewer than three stools per week and severe constipation as less than one stool per week.  Even if you have a regular bowel pattern at home, your normal regimen is likely to be disrupted due to multiple reasons following surgery.  Combination of anesthesia, postoperative narcotics, change in appetite and fluid intake all can affect your bowels.   YOU MUST use at least one of the following options; they are listed in order of increasing strength to get the job done.  They are all available over the counter, and you may need to use some, POSSIBLY even all of these options:    Drink plenty of fluids (prune juice may be helpful) and high fiber foods Colace 100 mg by mouth twice a day  Senokot for constipation as directed and as needed Dulcolax (bisacodyl), take with full glass of water   Miralax (polyethylene glycol) once or twice a day as needed.  If you have tried all these things and are unable to have a bowel movement in the first 3-4 days after surgery call either your surgeon or your primary doctor.    If you experience loose stools or diarrhea, hold the medications until you stool forms back up.  If your symptoms do not get better within 1 week or if they get worse, check with your  doctor.  If you experience the worst abdominal pain ever or develop nausea or vomiting, please contact the office immediately for further recommendations for treatment.  ITCHING:  If you experience itching with your medications, try taking only a single pain pill, or even half a pain pill at a time.  You can also use Benadryl  over the counter for itching or also to help with sleep.   TED HOSE STOCKINGS:  Use stockings on both legs until for at least 2 weeks or as directed by physician office. They may be removed at night for sleeping.  MEDICATIONS:  See your medication summary on the "After Visit Summary" that nursing will review with you.  You may have some home medications which will be placed on hold until you complete the course of blood thinner medication.  It is important for you to complete the blood thinner medication as prescribed.  Blood clot prevention (DVT Prophylaxis): After surgery you are at an increased risk for a blood clot.  You were prescribed a blood thinner, Xarelto 10mg , to be taken once daily for a total of 4 weeks from surgery to help reduce your risk of getting a blood clot.  Signs of a pulmonary embolus (blood clot in the lungs) include sudden short of breath, feeling lightheaded or dizzy, chest pain with a deep breath, rapid pulse rapid breathing.  Signs of a blood clot in your arms or legs include new unexplained swelling and cramping, warm, red or darkened skin around the painful area.  Please call the office or 911 right away if these signs or symptoms develop.  PRECAUTIONS:   If you experience chest pain or shortness of breath - call 911 immediately for transfer to the hospital emergency department.   If you develop a fever greater that 101 F, purulent drainage from wound, increased redness or drainage from wound, foul odor from the wound/dressing, or calf pain - CONTACT YOUR SURGEON.                                                   FOLLOW-UP APPOINTMENTS:  If you do  not already have a post-op appointment, please call the office for an appointment to be seen by your surgeon.  Guidelines for how soon to be seen are listed in your "After Visit Summary", but are typically between 2-3 weeks after surgery.  If you have a specialized bandage, you may be told to follow up 1 week after surgery.  OTHER INSTRUCTIONS:  Knee Replacement:  Do not place pillow under knee, focus on keeping the knee straight while resting.  Place foam block, curve side up under heel at all times except when walking.  DO NOT modify, tear, cut, or change the foam block in any way.  POST-OPERATIVE OPIOID TAPER INSTRUCTIONS: It is important to wean off of your opioid medication as soon as possible. If you do not need pain medication after your surgery it is ok to stop day one. Opioids include: Codeine, Hydrocodone (Norco, Vicodin), Oxycodone (Percocet, oxycontin ) and hydromorphone  amongst others.  Long term and even short term use of opiods can cause: Increased pain response Dependence Constipation Depression Respiratory depression And more.  Withdrawal symptoms can include Flu like symptoms Nausea, vomiting And more Techniques to manage these symptoms Hydrate well Eat regular healthy meals Stay active Use relaxation techniques(deep breathing, meditating, yoga) Do Not substitute Alcohol to help with tapering If you have been on opioids for less than two weeks and do not have pain than it is ok to stop all together.  Plan to wean off of opioids This plan should start within one week post op of your joint replacement. Maintain the same interval or time between taking each dose and first decrease the dose.  Cut the total daily intake of opioids by one tablet each day Next start to increase the time between doses. The last dose that should be eliminated is the evening dose.   MAKE SURE YOU:  Understand these instructions.  Get help right away if you are not doing well or get worse.     Thank you for letting us  be a part of your medical care team.  It is a privilege we respect greatly.  We hope these instructions will help you stay on track for a fast and full recovery!    Information on my medicine - XARELTO (Rivaroxaban)     Why was Xarelto prescribed for you? Xarelto was prescribed for you to reduce the risk of blood clots forming after orthopedic surgery. The medical term for these abnormal blood clots is venous thromboembolism (VTE).  What do you need to know about xarelto ? Take your Xarelto ONCE DAILY at the same time every day. You may take it either with or without food.  If you have difficulty swallowing the tablet whole, you may crush it and mix in applesauce just prior to taking your dose.  Take Xarelto exactly as prescribed by your doctor and DO NOT stop taking Xarelto without talking to the doctor who prescribed the medication.  Stopping without other VTE prevention medication to take the place of Xarelto may increase your risk of developing a clot.  After discharge, you should have regular check-up appointments with your healthcare provider that is prescribing your Xarelto.    What do you do if you miss a dose? If you miss a dose, take it as soon as you remember on the same day then continue your regularly scheduled once daily regimen the next day. Do not take two doses of Xarelto on the same day.   Important Safety Information A possible side effect of Xarelto is bleeding. You should call your healthcare provider right away if you experience any of the following: Bleeding from an injury or your nose that does not stop. Unusual colored urine (red or dark brown) or unusual colored stools (red or black). Unusual bruising for unknown reasons. A serious fall or if you hit your head (even if there is no bleeding).  Some medicines may interact with Xarelto and might increase your risk of bleeding while on Xarelto. To help avoid this, consult  your healthcare provider or pharmacist prior to using any new prescription or non-prescription medications, including herbals, vitamins, non-steroidal anti-inflammatory drugs (NSAIDs) and supplements.  This website has more information on Xarelto: [email or web address].          Signed: Lauriann Milillo A Taiquan Campanaro 10/29/2023, 6:07 AM

## 2023-11-04 DIAGNOSIS — T84013A Broken internal left knee prosthesis, initial encounter: Secondary | ICD-10-CM | POA: Diagnosis not present

## 2023-11-11 DIAGNOSIS — T84013D Broken internal left knee prosthesis, subsequent encounter: Secondary | ICD-10-CM | POA: Diagnosis not present

## 2023-11-15 ENCOUNTER — Ambulatory Visit (INDEPENDENT_AMBULATORY_CARE_PROVIDER_SITE_OTHER): Admitting: Family Medicine

## 2023-11-18 DIAGNOSIS — M1711 Unilateral primary osteoarthritis, right knee: Secondary | ICD-10-CM | POA: Diagnosis not present

## 2023-11-18 DIAGNOSIS — M1712 Unilateral primary osteoarthritis, left knee: Secondary | ICD-10-CM | POA: Diagnosis not present

## 2023-11-20 ENCOUNTER — Other Ambulatory Visit: Payer: Self-pay | Admitting: Cardiology

## 2023-11-25 DIAGNOSIS — M1711 Unilateral primary osteoarthritis, right knee: Secondary | ICD-10-CM | POA: Diagnosis not present

## 2023-12-02 DIAGNOSIS — M1711 Unilateral primary osteoarthritis, right knee: Secondary | ICD-10-CM | POA: Diagnosis not present

## 2023-12-07 ENCOUNTER — Other Ambulatory Visit: Payer: Self-pay | Admitting: Cardiology

## 2023-12-07 DIAGNOSIS — R931 Abnormal findings on diagnostic imaging of heart and coronary circulation: Secondary | ICD-10-CM

## 2023-12-07 DIAGNOSIS — I2510 Atherosclerotic heart disease of native coronary artery without angina pectoris: Secondary | ICD-10-CM

## 2023-12-07 DIAGNOSIS — E782 Mixed hyperlipidemia: Secondary | ICD-10-CM

## 2023-12-13 ENCOUNTER — Ambulatory Visit (INDEPENDENT_AMBULATORY_CARE_PROVIDER_SITE_OTHER): Admitting: Family Medicine

## 2023-12-13 DIAGNOSIS — H04222 Epiphora due to insufficient drainage, left lacrimal gland: Secondary | ICD-10-CM | POA: Diagnosis not present

## 2023-12-13 DIAGNOSIS — H5203 Hypermetropia, bilateral: Secondary | ICD-10-CM | POA: Diagnosis not present

## 2023-12-13 DIAGNOSIS — H2513 Age-related nuclear cataract, bilateral: Secondary | ICD-10-CM | POA: Diagnosis not present

## 2023-12-14 ENCOUNTER — Ambulatory Visit (INDEPENDENT_AMBULATORY_CARE_PROVIDER_SITE_OTHER): Admitting: Family Medicine

## 2023-12-14 ENCOUNTER — Encounter (INDEPENDENT_AMBULATORY_CARE_PROVIDER_SITE_OTHER): Payer: Self-pay | Admitting: Family Medicine

## 2023-12-14 VITALS — BP 127/72 | HR 51 | Temp 97.7°F | Ht 75.0 in | Wt 267.0 lb

## 2023-12-14 DIAGNOSIS — R7303 Prediabetes: Secondary | ICD-10-CM | POA: Diagnosis not present

## 2023-12-14 DIAGNOSIS — E669 Obesity, unspecified: Secondary | ICD-10-CM | POA: Diagnosis not present

## 2023-12-14 DIAGNOSIS — Z6833 Body mass index (BMI) 33.0-33.9, adult: Secondary | ICD-10-CM

## 2023-12-14 DIAGNOSIS — E559 Vitamin D deficiency, unspecified: Secondary | ICD-10-CM | POA: Diagnosis not present

## 2023-12-14 DIAGNOSIS — I10 Essential (primary) hypertension: Secondary | ICD-10-CM | POA: Diagnosis not present

## 2023-12-14 NOTE — Progress Notes (Signed)
 Jonathan Dyer, D.O.  ABFM, ABOM Specializing in Clinical Bariatric Medicine  Office located at: [address] [place], [place]  [address]   Assessment and Plan:   Recheck fasting insulin , A1c, FLP, and Vit D next OV   FOR THE DISEASE OF OBESITY:  BMI 33.0-33.9, current BMI 33.37 Obesity, Beginning BMI 34.25 Assessment & Plan: Since last office visit on 10/13/2023 patient's muscle mass has decreased by 1.6 lbs. Fat mass has increased by 3.4 lbs. Total body water  has increased by 1 lbs.  Body fat % has increased by 1  %. Counseling done on how various foods will affect these numbers and how to maximize success  Total lbs lost to date: - 7 lbs Total weight loss percentage to date: -2.55 %   Recommended Dietary Goals Hamdi is currently in the action stage of change. As such, his goal is to continue weight management plan.  He has agreed to: continue current plan   Behavioral Intervention We discussed the following today: increasing lean protein intake to established goals, increasing vegetables, increasing water  intake , work on tracking and journaling calories using tracking application, and using GPT or another AI platform for recipe ideas- searching low calorie, low carb, high protein chicken recipes etc  Additional resources provided today: Handout on slow cooker recipes  Evidence-based interventions for health behavior change were utilized today including the discussion of self monitoring techniques, problem-solving barriers and SMART goal setting techniques.   Regarding patient's less desirable eating habits and patterns, we employed the technique of small changes.   Goal: explore CHAT-GPT for recipe ideas   Recommended Physical Activity Goals Dyke has been advised to work up to 300-450 minutes of moderate intensity aerobic activity a week and strengthening exercises 2-3 times per week for cardiovascular health, weight loss maintenance and preservation of muscle  mass.   He is limited in physical activity at present due to his recent left knee replacement. He has two more weeks of activity restriction.    Pharmacotherapy Continue with current nutritional and behavioral strategies   ASSOCIATED CONDITIONS ADDRESSED TODAY:   Prediabetes Assessment & Plan: Lab Results  Component Value Date   HGBA1C 5.6 06/23/2023   HGBA1C 5.5 01/05/2023   HGBA1C 5.7 (H) 07/21/2022   INSULIN  12.2 06/23/2023   INSULIN  9.1 01/05/2023   INSULIN  11.5 07/21/2022    Pre-DM managed with dietary and life-style interventions. He is 7 week post-left knee replacement and is recovering well. Food wise, he endorses not journaling his intake and is eating off plan foods like pizza.  - Continue to decrease simple carbs/ sugars; increase fiber and proteins -> journal intake or follow Category 2 meal plan. - Losing 10% or more of adipose tissue may improve condition.  - Recheck A1c next OV    Vitamin D  deficiency Assessment & Plan: Lab Results  Component Value Date   VD25OH 48.8 06/23/2023   VD25OH 42.9 01/05/2023   VD25OH 43.3 07/21/2022   Currently on Ergocalciferol  50,000 units weekly without any adverse effects such as nausea, vomiting or muscle weakness. No acute concerns.   - Continue vitamin D  supplementation. - Recheck levels as deemed clinically necessary.     Essential hypertension Assessment & Plan: Last 3 blood pressure readings in our office are as follows: BP Readings from Last 3 Encounters:  12/14/23 127/72  10/28/23 (!) 149/82  10/14/23 (!) 147/78   The 10-year ASCVD risk score (Arnett DK, et al., 2019) is: 19.8%  Lab Results  Component  Value Date   CREATININE 1.45 (H) 10/28/2023   Blood pressure is controlled on Amlodipine  10 mg weekly and Cozaar  100 mg daily. No acute concerns today.   - Continue medications.  - Continue with low sodium diet, advance exercise when okayed by orthopedic surgeon.     Follow up:   Return 01/25/2024  9:40 AM.  He was informed of the importance of frequent follow up visits to maximize his success with intensive lifestyle modifications for his multiple health conditions.   Subjective:   Chief complaint: Obesity Jonathan Dyer is here to discuss his progress with his obesity treatment plan. He is keeping a food journal and adhering to recommended goals of 1300-1400 calories and 120g+ protein with the Category 2 meal plan as a guide. States he is following his meal plan 0% of the time.   Interval History:  Jonathan Dyer is here for a follow up office visit. He is 7 week post-left knee replacement and is recovering well. He has done physical therapy once and is doing cardio on his own on average 4 days a week. Food wise, he endorses not journaling his intake and eating off plan foods like pizza. He's ready to get back on track. Since last OV on 10/13/2023 , he is up 2 lbs.    Pharmacotherapy that aid with weight loss: none   Review of Systems:  Pertinent positives were addressed with patient today.  Reviewed by clinician on day of visit: allergies, medications, problem list, medical history, surgical history, family history, social history, and previous encounter notes.  Weight Summary and Biometrics   Weight Lost Since Last Visit: 0  Weight Gained Since Last Visit: 2 lb  Vitals Temp: 97.7 F (36.5 C) BP: 127/72 Pulse Rate: (!) 51 SpO2: 98 %   Anthropometric Measurements Height: 6' 3 (1.905 m) Weight: 267 lb (121.1 kg) BMI (Calculated): 33.37 Weight at Last Visit: 265 lb Weight Lost Since Last Visit: 0 Weight Gained Since Last Visit: 2 lb Starting Weight: 274 lb Total Weight Loss (lbs): 7 lb (3.175 kg)   Body Composition  Body Fat %: 34.9 % Fat Mass (lbs): 93.4 lbs Muscle Mass (lbs): 165.4 lbs Total Body Water  (lbs): 122 lbs Visceral Fat Rating : 21   Other Clinical Data Today's Visit #: 31 Starting Date: 01/27/22    Objective:   PHYSICAL EXAM: Blood pressure  127/72, pulse (!) 51, temperature 97.7 F (36.5 C), height 6' 3 (1.905 m), weight 267 lb (121.1 kg), SpO2 98%. Body mass index is 33.37 kg/m.  General: he is overweight, cooperative and in no acute distress. PSYCH: Has normal mood, affect and thought process.   HEENT: EOMI, sclerae are anicteric. Lungs: Normal breathing effort, no conversational dyspnea. Extremities: Moves * 4 Neurologic: A and O * 3, good insight  DIAGNOSTIC DATA REVIEWED: BMET    Component Value Date/Time   NA 137 10/28/2023 0331   NA 143 06/23/2023 1055   K 4.1 10/28/2023 0331   CL 104 10/28/2023 0331   CO2 23 10/28/2023 0331   GLUCOSE 143 (H) 10/28/2023 0331   BUN 18 10/28/2023 0331   BUN 19 06/23/2023 1055   CREATININE 1.45 (H) 10/28/2023 0331   CREATININE 1.34 (H) 10/31/2020 0946   CALCIUM  8.3 (L) 10/28/2023 0331   GFRNONAA 52 (L) 10/28/2023 0331   GFRNONAA 54 (L) 10/31/2020 0946   GFRAA 63 10/31/2020 0946   Lab Results  Component Value Date   HGBA1C 5.6 06/23/2023   HGBA1C 5.7 10/21/2018  Lab Results  Component Value Date   INSULIN  12.2 06/23/2023   INSULIN  12.6 01/27/2022   Lab Results  Component Value Date   TSH 1.450 09/02/2023   CBC    Component Value Date/Time   WBC 12.0 (H) 10/28/2023 0331   RBC 4.39 10/28/2023 0331   HGB 13.2 10/28/2023 0331   HGB 15.5 03/17/2023 1048   HCT 39.4 10/28/2023 0331   HCT 45.8 03/17/2023 1048   PLT 251 10/28/2023 0331   PLT 283 03/17/2023 1048   MCV 89.7 10/28/2023 0331   MCV 90 03/17/2023 1048   MCH 30.1 10/28/2023 0331   MCHC 33.5 10/28/2023 0331   RDW 11.9 10/28/2023 0331   RDW 12.2 03/17/2023 1048   Iron Studies    Component Value Date/Time   IRON 86 07/21/2022 0802   TIBC 280 07/21/2022 0802   FERRITIN 228 07/21/2022 0802   IRONPCTSAT 31 07/21/2022 0802   Lipid Panel     Component Value Date/Time   CHOL 161 01/05/2023 1114   TRIG 133 01/05/2023 1114   HDL 43 01/05/2023 1114   CHOLHDL 3.7 01/05/2023 1114   CHOLHDL 6  04/01/2021 0813   VLDL 24.4 04/01/2021 0813   LDLCALC 94 01/05/2023 1114   LDLCALC 131 (H) 10/03/2019 0926   Hepatic Function Panel     Component Value Date/Time   PROT 7.4 10/14/2023 1056   PROT 7.1 06/23/2023 1055   ALBUMIN 4.0 10/14/2023 1056   ALBUMIN 4.4 06/23/2023 1055   AST 17 10/14/2023 1056   ALT 15 10/14/2023 1056   ALKPHOS 83 10/14/2023 1056   BILITOT 1.1 10/14/2023 1056   BILITOT 0.5 06/23/2023 1055      Component Value Date/Time   TSH 1.450 09/02/2023 0901   Nutritional Lab Results  Component Value Date   VD25OH 48.8 06/23/2023   VD25OH 42.9 01/05/2023   VD25OH 43.3 07/21/2022    Attestations:   I, Special Puri, acting as a Stage manager for [employer], DO., have compiled all relevant documentation for today's office visit on behalf of Jonathan Jenkins, DO, while in the presence of [employer], DO.  I have reviewed the above documentation for accuracy and completeness, and I agree with the above. Jonathan JINNY Dyer, D.O.  The 21st Century Cures Act was signed into law in 2016 which includes the topic of electronic health records.  This provides immediate access to information in MyChart.  This includes consultation notes, operative notes, office notes, lab results and pathology reports.  If you have any questions about what you read please let us  know at your next visit so we can discuss your concerns and take corrective action if need be.  We are right here with you.

## 2023-12-28 ENCOUNTER — Encounter (INDEPENDENT_AMBULATORY_CARE_PROVIDER_SITE_OTHER): Payer: Self-pay | Admitting: Family Medicine

## 2023-12-30 DIAGNOSIS — M1712 Unilateral primary osteoarthritis, left knee: Secondary | ICD-10-CM | POA: Diagnosis not present

## 2024-01-25 ENCOUNTER — Encounter (INDEPENDENT_AMBULATORY_CARE_PROVIDER_SITE_OTHER): Payer: Self-pay | Admitting: Family Medicine

## 2024-01-25 ENCOUNTER — Ambulatory Visit (INDEPENDENT_AMBULATORY_CARE_PROVIDER_SITE_OTHER): Admitting: Family Medicine

## 2024-01-25 VITALS — BP 115/66 | HR 54 | Temp 97.6°F | Ht 75.0 in | Wt 271.0 lb

## 2024-01-25 DIAGNOSIS — Z6832 Body mass index (BMI) 32.0-32.9, adult: Secondary | ICD-10-CM

## 2024-01-25 DIAGNOSIS — N1831 Chronic kidney disease, stage 3a: Secondary | ICD-10-CM | POA: Diagnosis not present

## 2024-01-25 DIAGNOSIS — I129 Hypertensive chronic kidney disease with stage 1 through stage 4 chronic kidney disease, or unspecified chronic kidney disease: Secondary | ICD-10-CM | POA: Diagnosis not present

## 2024-01-25 DIAGNOSIS — E669 Obesity, unspecified: Secondary | ICD-10-CM

## 2024-01-25 DIAGNOSIS — Z6833 Body mass index (BMI) 33.0-33.9, adult: Secondary | ICD-10-CM

## 2024-01-25 DIAGNOSIS — I10 Essential (primary) hypertension: Secondary | ICD-10-CM

## 2024-01-25 DIAGNOSIS — R7303 Prediabetes: Secondary | ICD-10-CM | POA: Diagnosis not present

## 2024-01-25 DIAGNOSIS — E65 Localized adiposity: Secondary | ICD-10-CM | POA: Insufficient documentation

## 2024-01-25 NOTE — Progress Notes (Signed)
 Jonathan Dyer, D.O.  ABFM, ABOM Specializing in Clinical Bariatric Medicine  Office located at: [address] [place], [place]  [address]   Assessment and Plan:   Recheck fasting insulin , A1c, FLP, CMP with GFR and Vit D next OV  FOR THE DISEASE OF OBESITY:  Obesity, Beginning BMI 34.25 BMI 32.0-32.9,adult-current bmi 33.87 Assessment & Plan: Since last office visit on 12/14/23 patient's muscle mass has increased by 1.4 lbs. Fat mass has increased by 2.8 lbs. Total body water  has increased by 1.4 lbs. Body fat % has increased by 0.5 %. Counseling done on how various foods will affect these numbers and how to maximize success  Total lbs lost to date: - 3 lbs Total weight loss percentage to date: -1.09 %  - Tracking Calories/Macros: yes  - Eating More Whole Foods: yes  - Adequate Protein Intake: no   - Adequate Water  Intake: no  - Skipping Meals: no   - Sleeping 7-9 Hours/ Night: no   Recommended Dietary Goals Aashish is currently in the action stage of change. As such, his goal is to continue weight management plan.  He has agreed to: 1350-1400 Calories with 85++ g protein    Behavioral Intervention We discussed the following today: increasing water  intake , work on meal planning and preparation, practice mindfulness eating and understand the difference between hunger signals and cravings, and better snacking choices,fair life protein shakes,using egg whites to help get more protein in.  Additional resources provided today: Handout on Healthy [employer]  and Handout on Daily Food Journaling Log  Evidence-based interventions for health behavior change were utilized today including the discussion of self monitoring techniques, problem-solving barriers and SMART goal setting techniques.   Regarding patient's less desirable eating habits and patterns, we employed the technique of small changes.   Goal(s) for next OV: n/a    Recommended Physical Activity  Goals Francois has been advised to work up to 300-450 minutes of moderate intensity aerobic activity a week and strengthening exercises 2-3 times per week for cardiovascular health, weight loss maintenance and preservation of muscle mass.   He has agreed to: Increase physical activity in their day and reduce sedentary time (increase NEAT)., Increase volume of physical activity to a goal of 240 minutes a week, and Combine aerobic and strengthening exercises for efficiency and improved cardiometabolic health.   Pharmacotherapy We both agreed to: Continue with current nutritional and behavioral strategies   ASSOCIATED CONDITIONS ADDRESSED TODAY:  Prediabetes Assessment & Plan Lab Results  Component Value Date   HGBA1C 5.6 06/23/2023   HGBA1C 5.5 01/05/2023   HGBA1C 5.7 (H) 07/21/2022   INSULIN  12.2 06/23/2023   INSULIN  9.1 01/05/2023   INSULIN  11.5 07/21/2022    Managed with dietary and lifestyle interventions. Pt states that he has some sugar cravings occasionally but not always. Informed pt that medications are out there to help lessen the food noise and help control cravings like metformin . Pt denies wanting to start any medication and states that he wants to do it naturally with food first. Recommended to eat protein with every meal and eat small meals throughout the day to help with hunger. Continue following meal plan. Will obtain labs at next OV.    Essential hypertension Assessment & Plan BP Readings from Last 3 Encounters:  01/25/24 115/66  12/14/23 127/72  10/28/23 (!) 149/82   The 10-year ASCVD risk score (Arnett DK, et al., 2019) is: 16.9%  Lab Results  Component Value Date  CREATININE 1.45 (H) 10/28/2023   On Amlodipine  10 mg weekly and Cozaar  100 mg daily. Good compliance and tolerance. No concerns today. BP looks good today. Continue with medications and low sodium diet.       Chronic kidney disease (CKD) stage G3a/A2, moderately decreased glomerular filtration  rate (GFR) between 45-59 mL/min/1.73 square meter [facility]) Assessment & Plan Lab Results  Component Value Date   NA 137 10/28/2023   CL 104 10/28/2023   K 4.1 10/28/2023   CO2 23 10/28/2023   BUN 18 10/28/2023   CREATININE 1.45 (H) 10/28/2023   GFRNONAA 52 (L) 10/28/2023   CALCIUM  8.3 (L) 10/28/2023   ALBUMIN 4.0 10/14/2023   GLUCOSE 143 (H) 10/28/2023   Reviewed lab today and discussed how hydration should be increased in order to help wit kidney filtration. Discussed how GFR should be greater than 60. Educated and answered questions about condition. Continue to follow prudent meal plan, decreasing sodium intake.      Visceral obesity Assessment & Plan Current visceral fat rating: 22. Goal visceral fat rating should be < 10 in male. Educated on how higher visceral fat ratings are associated with medical disorder such as metabolic syndrome and cardiovascular disease. Continue fat losing and limit simple carbs and high fat foods.      Follow up:   Return 03/05/24 8:20 AM at  He was informed of the importance of frequent follow up visits to maximize his success with intensive lifestyle modifications for his multiple health conditions.    Subjective:    Chief complaint: Obesity Cottrell is here to discuss his progress with his obesity treatment plan. He is keeping a food journal and adhering to recommended goals of 1300-1400 calories and 120g+ protein with the Category 2 meal plan as a guide  and states he is following his eating plan approximately 70% of the time. Pt is biking and weight lifting 30-90 minutes 4-5 days per week    Interval History:  [name] is here for a follow up office visit. Pt has experienced a weight gain of 4 lbs since last OV on 12/14/23. Pt states that he is journaling and focusing on his lean protein intake. He admits to upping protein but realizing that was making the calories go up as well. He eats salmon and does occasionally have protein shakes.  When he rides for over an hour on his bike he states that he begins to get hungry and usually snacks on fig bars and bananas.He is also implementing body weight exercises.     Pharmacotherapy that aid with weight loss: He is currently taking no anti-obesity medication.    Review of Systems:  Pertinent positives were addressed with patient today.  Reviewed by clinician on day of visit: allergies, medications, problem list, medical history, surgical history, family history, social history, and previous encounter notes.  Weight Summary and Biometrics   Weight Lost Since Last Visit: 0  Weight Gained Since Last Visit: 4lb    Vitals Temp: 97.6 F (36.4 C) BP: 115/66 Pulse Rate: (!) 54 SpO2: 96 %   Anthropometric Measurements Height: 6' 3 (1.905 m) Weight: 271 lb (122.9 kg) BMI (Calculated): 33.87 Weight at Last Visit: 267lb Weight Lost Since Last Visit: 0 Weight Gained Since Last Visit: 4lb Starting Weight: 274lb Total Weight Loss (lbs): 3 lb (1.361 kg)   Body Composition  Body Fat %: 35.4 % Fat Mass (lbs): 96.2 lbs Muscle Mass (lbs): 166.8 lbs Total Body Water  (lbs): 123.4 lbs Visceral Fat  Rating : 22   Other Clinical Data Fasting: no Labs: no Today's Visit #: 71 Starting Date: 01/27/22    Objective:   PHYSICAL EXAM: Blood pressure 115/66, pulse (!) 54, temperature 97.6 F (36.4 C), height 6' 3 (1.905 m), weight 271 lb (122.9 kg), SpO2 96%. Body mass index is 33.87 kg/m.  General: he is overweight, cooperative and in no acute distress. PSYCH: Has normal mood, affect and thought process.   HEENT: EOMI, sclerae are anicteric. Lungs: Normal breathing effort, no conversational dyspnea. Extremities: Moves * 4 Neurologic: A and O * 3, good insight  DIAGNOSTIC DATA REVIEWED: BMET    Component Value Date/Time   NA 137 10/28/2023 0331   NA 143 06/23/2023 1055   K 4.1 10/28/2023 0331   CL 104 10/28/2023 0331   CO2 23 10/28/2023 0331   GLUCOSE 143 (H)  10/28/2023 0331   BUN 18 10/28/2023 0331   BUN 19 06/23/2023 1055   CREATININE 1.45 (H) 10/28/2023 0331   CREATININE 1.34 (H) 10/31/2020 0946   CALCIUM  8.3 (L) 10/28/2023 0331   GFRNONAA 52 (L) 10/28/2023 0331   GFRNONAA 54 (L) 10/31/2020 0946   GFRAA 63 10/31/2020 0946   Lab Results  Component Value Date   HGBA1C 5.6 06/23/2023   HGBA1C 5.7 10/21/2018   Lab Results  Component Value Date   INSULIN  12.2 06/23/2023   INSULIN  12.6 01/27/2022   Lab Results  Component Value Date   TSH 1.450 09/02/2023   CBC    Component Value Date/Time   WBC 12.0 (H) 10/28/2023 0331   RBC 4.39 10/28/2023 0331   HGB 13.2 10/28/2023 0331   HGB 15.5 03/17/2023 1048   HCT 39.4 10/28/2023 0331   HCT 45.8 03/17/2023 1048   PLT 251 10/28/2023 0331   PLT 283 03/17/2023 1048   MCV 89.7 10/28/2023 0331   MCV 90 03/17/2023 1048   MCH 30.1 10/28/2023 0331   MCHC 33.5 10/28/2023 0331   RDW 11.9 10/28/2023 0331   RDW 12.2 03/17/2023 1048   Iron Studies    Component Value Date/Time   IRON 86 07/21/2022 0802   TIBC 280 07/21/2022 0802   FERRITIN 228 07/21/2022 0802   IRONPCTSAT 31 07/21/2022 0802   Lipid Panel     Component Value Date/Time   CHOL 161 01/05/2023 1114   TRIG 133 01/05/2023 1114   HDL 43 01/05/2023 1114   CHOLHDL 3.7 01/05/2023 1114   CHOLHDL 6 04/01/2021 0813   VLDL 24.4 04/01/2021 0813   LDLCALC 94 01/05/2023 1114   LDLCALC 131 (H) 10/03/2019 0926   Hepatic Function Panel     Component Value Date/Time   PROT 7.4 10/14/2023 1056   PROT 7.1 06/23/2023 1055   ALBUMIN 4.0 10/14/2023 1056   ALBUMIN 4.4 06/23/2023 1055   AST 17 10/14/2023 1056   ALT 15 10/14/2023 1056   ALKPHOS 83 10/14/2023 1056   BILITOT 1.1 10/14/2023 1056   BILITOT 0.5 06/23/2023 1055      Component Value Date/Time   TSH 1.450 09/02/2023 0901   Nutritional Lab Results  Component Value Date   VD25OH 48.8 06/23/2023   VD25OH 42.9 01/05/2023   VD25OH 43.3 07/21/2022    Attestations:   I,  Sonny Laroche, acting as a Stage manager for Jonathan Jenkins, DO., have compiled all relevant documentation for today's office visit on behalf of Jonathan Jenkins, DO, while in the presence of [employer], DO.   I have reviewed the above documentation for accuracy and completeness, and I  agree with the above. Jonathan JINNY Dyer, D.O.  The 21st Century Cures Act was signed into law in 2016 which includes the topic of electronic health records.  This provides immediate access to information in MyChart.  This includes consultation notes, operative notes, office notes, lab results and pathology reports.  If you have any questions about what you read please let us  know at your next visit so we can discuss your concerns and take corrective action if need be.  We are right here with you.

## 2024-01-27 ENCOUNTER — Encounter: Payer: Self-pay | Admitting: Internal Medicine

## 2024-01-28 ENCOUNTER — Ambulatory Visit

## 2024-01-31 ENCOUNTER — Ambulatory Visit: Admitting: Nurse Practitioner

## 2024-02-03 ENCOUNTER — Telehealth: Payer: Self-pay | Admitting: *Deleted

## 2024-02-03 NOTE — Telephone Encounter (Signed)
 LMOVM to return call  Spoke to Therisa NOVAK, NP about scheduling pt for MRI and pt has a Youth worker. Pt has upcoming appointment on  03/28/24 with her and she says just to wait until pt comes in for OV before scheduling him.  Called MRI and spoke with Candis, who said it would be ok to schedule pt but make sure to bring his card.   Pt left vm stating he received a letter to schedule 2 yr MRI.

## 2024-02-08 NOTE — Telephone Encounter (Signed)
 Pt called back and is aware

## 2024-02-24 ENCOUNTER — Ambulatory Visit (INDEPENDENT_AMBULATORY_CARE_PROVIDER_SITE_OTHER): Admitting: Family Medicine

## 2024-02-28 ENCOUNTER — Telehealth: Payer: Self-pay

## 2024-02-28 NOTE — Telephone Encounter (Signed)
 Called to check on patient, who had Watchman implant 02/04/2023.  Left message to call back.

## 2024-02-29 NOTE — Telephone Encounter (Signed)
 Spoke with patient. He is doing well 1 year post Watchman with no concerns. He is riding his bike without symptoms 1.5-2hr. He is not taking Xarelto  anymore - this was listed because of a surgery.  He is taking 81mg  aspirin  daily.   He was grateful for the phone call.

## 2024-02-29 NOTE — Addendum Note (Signed)
 Addended by: WAYLON EDSEL HERO on: 02/29/2024 03:57 PM   Modules accepted: Orders

## 2024-03-06 ENCOUNTER — Ambulatory Visit (INDEPENDENT_AMBULATORY_CARE_PROVIDER_SITE_OTHER): Admitting: Family Medicine

## 2024-03-06 ENCOUNTER — Encounter (INDEPENDENT_AMBULATORY_CARE_PROVIDER_SITE_OTHER): Payer: Self-pay | Admitting: Family Medicine

## 2024-03-06 VITALS — BP 127/72 | HR 50 | Ht 75.0 in | Wt 269.0 lb

## 2024-03-06 DIAGNOSIS — E782 Mixed hyperlipidemia: Secondary | ICD-10-CM | POA: Diagnosis not present

## 2024-03-06 DIAGNOSIS — I129 Hypertensive chronic kidney disease with stage 1 through stage 4 chronic kidney disease, or unspecified chronic kidney disease: Secondary | ICD-10-CM

## 2024-03-06 DIAGNOSIS — E559 Vitamin D deficiency, unspecified: Secondary | ICD-10-CM

## 2024-03-06 DIAGNOSIS — N1831 Chronic kidney disease, stage 3a: Secondary | ICD-10-CM

## 2024-03-06 DIAGNOSIS — R7303 Prediabetes: Secondary | ICD-10-CM

## 2024-03-06 DIAGNOSIS — Z6832 Body mass index (BMI) 32.0-32.9, adult: Secondary | ICD-10-CM

## 2024-03-06 DIAGNOSIS — I10 Essential (primary) hypertension: Secondary | ICD-10-CM

## 2024-03-06 DIAGNOSIS — E65 Localized adiposity: Secondary | ICD-10-CM

## 2024-03-06 DIAGNOSIS — E669 Obesity, unspecified: Secondary | ICD-10-CM

## 2024-03-06 MED ORDER — VITAMIN D (ERGOCALCIFEROL) 1.25 MG (50000 UNIT) PO CAPS: 50000.0000 [IU] | ORAL_CAPSULE | ORAL | 0 refills | Status: AC

## 2024-03-06 NOTE — Progress Notes (Signed)
 Jonathan Dyer, D.O.  ABFM, ABOM Specializing in Clinical Bariatric Medicine  Office located at: [address] [place], [place]  [address]      A) FOR THE CHRONIC DISEASE OF OBESITY:  Chief complaint: Obesity Jonathan Dyer is here to discuss his progress with his obesity treatment plan.   History of present illness / Interval history:  Jonathan Dyer is here today for his follow-up office visit.  Since last OV on 01/25/2024, pt is down 2 lbs.     01/25/24 09:00 03/06/24 08:00   Body Fat % 35.4 % 34.3 %  Muscle Mass (lbs) 166.8 lbs 168.6 lbs  Fat Mass (lbs) 96.2 lbs 92.6 lbs  Total Body Water  (lbs) 123.4 lbs 120.4 lbs  Visceral Fat Rating  22 21   Counseling done on how various foods will affect these numbers and how to maximize success   Total lbs lost to date: -5 lbs Total Fat Mass in lbs lost to date: -1.4 Total weight loss percentage to date: -1.82 %   Nutrition Therapy He is currently on 1350-1400 Calories with 85++ g protein and states he is following his eating plan approximately 75 % of the time.   - Tracking Calories/Macros: yes  - Eating More Whole Foods: yes  - Adequate Protein Intake: yes  - Adequate Water  Intake: no -    - Skipping Meals: yes  - Sleeping 7-9 Hours/ Night: no   Jonathan Dyer is currently in the action stage of change. As such, his goal is to continue weight management plan.  He has agreed to: continue current plan   Physical Activity Pt is biking and doing weights 60   minutes 4-5 days per week   Daivon has been advised to work up to 300-450 minutes of moderate intensity aerobic activity a week and strengthening exercises 2-3 times per week for cardiovascular health, weight loss maintenance and preservation of muscle mass.  He has agreed to : Think about enjoyable ways to increase daily physical activity and overcoming barriers to exercise, Increase physical activity in their day and reduce sedentary time (increase NEAT)., Increase  volume of physical activity to a goal of 240 minutes a week, and Combine aerobic and strengthening exercises for efficiency and improved cardiometabolic health.   Behavioral Modifications Evidence-based interventions for health behavior change were utilized today including the discussion of  1) self monitoring techniques:  journaling 2) problem-solving barriers:  none 3) self care:  none 4) SMART goals for next OV:  Journal intake and be more consistent with MP  Regarding patient's less desirable eating habits and patterns, we employed the technique of small changes.   We discussed the following today: avoiding skipping meals and work on tracking and journaling calories using tracking application Additional resources provided today: Handout on the concepts of Adaptive Thermogenesis   Medical Interventions/ Pharmacotherapy Previous Bariatric surgery: none Pharmacotherapy for weight loss: He is not currently taking medications  for medical weight loss.    We discussed various medication options to help Jonathan Dyer with his weight loss efforts and we both agreed to : Continue with current nutritional and behavioral strategies   B) OBESITY RELATED CONDITIONS ADDRESSED TODAY:  Obesity, Beginning BMI 34.25 BMI 32.0-32.9,adult-current bmi  Prediabetes Assessment & Plan  Lab Results  Component Value Date   HGBA1C 5.6 06/23/2023   HGBA1C 5.5 01/05/2023   HGBA1C 5.7 (H) 07/21/2022   INSULIN  12.2 06/23/2023   INSULIN  9.1 01/05/2023   INSULIN  11.5 07/21/2022  Managed with dietary  and lifestyle interventions. Hunger and cravings well controlled. No acute concerns today. Will recheck A1c and fasting insulin  today. Cont decreasing simple carbs/sugars, increasing leans proteins. Encouraged to increase exercise as able.     Essential hypertension Assessment & Plan BP Readings from Last 3 Encounters:  03/06/24 127/72  01/25/24 115/66  12/14/23 127/72   The 10-year ASCVD risk score (Arnett DK,  et al., 2019) is: 19.8%  Lab Results  Component Value Date   CREATININE 1.45 (H) 10/28/2023  Currently on Norvasc  10 mg once daily and Losartan  100 mg daily with good compliance and tolerance. BP is well controlled today at 127/72. No acute concerns. Cont medication and follow a low-sodium diet. Encouraged to follow up with PCP as needed.     Visceral obesity Assessment & Plan  Current visceral fat rating: 21, improved from 22 on 01/25/2024.  The visceral fat rating should be < 10 in a male.  Visceral adipose tissue is a hormonally active component of total body fat. This body composition phenotype is associated with medical disorders such as metabolic syndrome, cardiovascular disease and several malignancies including prostate, breast, and colorectal cancers. Cont losing 7-10% of weight via prudent nutritional plan and lifestyle changes.     Chronic kidney disease (CKD) stage G3a/A2, moderately decreased glomerular filtration rate (GFR) between 45-59 mL/min/1.73 square meter [facility]) Assessment & Plan Lab Results  Component Value Date   CREATININE 1.45 (H) 10/28/2023   BUN 18 10/28/2023   NA 137 10/28/2023   K 4.1 10/28/2023   CL 104 10/28/2023   CO2 23 10/28/2023  Creatinine level is elevated at 1.45; optimal <1.24.  Educated pt that this could be due to dehydration. Encouraged to increase water  intake and follow up with PCP. Will recheck CMP today.     Hyperlipidemia, mixed Assessment & Plan  Lab Results  Component Value Date   CHOL 161 01/05/2023   HDL 43 01/05/2023   LDLCALC 94 01/05/2023   TRIG 133 01/05/2023   CHOLHDL 3.7 01/05/2023  Currently on Repatha  140 mg once every 2 weeks with poor compliance. Pt reports taking medication every 2-3 weeks rather than every 2 weeks. Encouraged pt to increase adherence to medication to help control cholesterol levels. Will recheck cholesterol today. Cont decreasing saturated and trans fats. Increase exercise as able.    Vitamin D   deficiency Assessment & Plan Lab Results  Component Value Date   VD25OH 48.8 06/23/2023   VD25OH 42.9 01/05/2023   VD25OH 43.3 07/21/2022  Currently on Ergo 50,000 units once weekly with good compliance and tolerance. Reports taking medication every 7-10 days. Encouraged pt to adhere to medication consistently as prescribed on a weekly schedule. No acute concerns today. Cont regimen (refill today). Will recheck labs today.     Medications Discontinued During This Encounter  Medication Reason   Vitamin D , Ergocalciferol , (DRISDOL ) 1.25 MG (50000 UNIT) CAPS capsule Reorder     Meds ordered this encounter  Medications   Vitamin D , Ergocalciferol , (DRISDOL ) 1.25 MG (50000 UNIT) CAPS capsule    Sig: Take 1 capsule (50,000 Units total) by mouth every 7 (seven) days.    Dispense:  13 capsule    Refill:  0      Follow up:   Return 04/17/2024 10:00 AM.  He was informed of the importance of frequent follow up visits to maximize his success with intensive lifestyle modifications for his multiple health conditions.  Nevaeh Carvel Huskins is aware that we will review all of his lab results at  our next visit.  He is aware that if anything is critical/ life threatening with the results, we will be contacting him via MyChart prior to the office visit to discuss management.     Weight Summary and Biometrics   Anthropometric Measurements Height: 6' 3 (1.905 m) Weight at Last Visit: 271lb Starting Weight: 274lb   No data recorded Other Clinical Data Labs: no Today's Visit #: 72 Starting Date: 01/27/22    Objective:   PHYSICAL EXAM: Height 6' 3 (1.905 m). Body mass index is 33.87 kg/m.  General: he is overweight, cooperative and in no acute distress. PSYCH: Has normal mood, affect and thought process.   HEENT: EOMI, sclerae are anicteric. Lungs: Normal breathing effort, no conversational dyspnea. Extremities: Moves * 4 Neurologic: A and O * 3, good insight  DIAGNOSTIC DATA  REVIEWED: BMET    Component Value Date/Time   NA 137 10/28/2023 0331   NA 143 06/23/2023 1055   K 4.1 10/28/2023 0331   CL 104 10/28/2023 0331   CO2 23 10/28/2023 0331   GLUCOSE 143 (H) 10/28/2023 0331   BUN 18 10/28/2023 0331   BUN 19 06/23/2023 1055   CREATININE 1.45 (H) 10/28/2023 0331   CREATININE 1.34 (H) 10/31/2020 0946   CALCIUM  8.3 (L) 10/28/2023 0331   GFRNONAA 52 (L) 10/28/2023 0331   GFRNONAA 54 (L) 10/31/2020 0946   GFRAA 63 10/31/2020 0946   Lab Results  Component Value Date   HGBA1C 5.6 06/23/2023   HGBA1C 5.7 10/21/2018   Lab Results  Component Value Date   INSULIN  12.2 06/23/2023   INSULIN  12.6 01/27/2022   Lab Results  Component Value Date   TSH 1.450 09/02/2023   CBC    Component Value Date/Time   WBC 12.0 (H) 10/28/2023 0331   RBC 4.39 10/28/2023 0331   HGB 13.2 10/28/2023 0331   HGB 15.5 03/17/2023 1048   HCT 39.4 10/28/2023 0331   HCT 45.8 03/17/2023 1048   PLT 251 10/28/2023 0331   PLT 283 03/17/2023 1048   MCV 89.7 10/28/2023 0331   MCV 90 03/17/2023 1048   MCH 30.1 10/28/2023 0331   MCHC 33.5 10/28/2023 0331   RDW 11.9 10/28/2023 0331   RDW 12.2 03/17/2023 1048   Iron Studies    Component Value Date/Time   IRON 86 07/21/2022 0802   TIBC 280 07/21/2022 0802   FERRITIN 228 07/21/2022 0802   IRONPCTSAT 31 07/21/2022 0802   Lipid Panel     Component Value Date/Time   CHOL 161 01/05/2023 1114   TRIG 133 01/05/2023 1114   HDL 43 01/05/2023 1114   CHOLHDL 3.7 01/05/2023 1114   CHOLHDL 6 04/01/2021 0813   VLDL 24.4 04/01/2021 0813   LDLCALC 94 01/05/2023 1114   LDLCALC 131 (H) 10/03/2019 0926   Hepatic Function Panel     Component Value Date/Time   PROT 7.4 10/14/2023 1056   PROT 7.1 06/23/2023 1055   ALBUMIN 4.0 10/14/2023 1056   ALBUMIN 4.4 06/23/2023 1055   AST 17 10/14/2023 1056   ALT 15 10/14/2023 1056   ALKPHOS 83 10/14/2023 1056   BILITOT 1.1 10/14/2023 1056   BILITOT 0.5 06/23/2023 1055      Component Value  Date/Time   TSH 1.450 09/02/2023 0901   Nutritional Lab Results  Component Value Date   VD25OH 48.8 06/23/2023   VD25OH 42.9 01/05/2023   VD25OH 43.3 07/21/2022    Attestations:   I, Feliciano Mingle, acting as a stage manager for [employer], DO.,  have compiled all relevant documentation for today's office visit on behalf of Jonathan Jenkins, DO, while in the presence of [employer], DO.   I have reviewed the above documentation for accuracy and completeness, and I agree with the above. Jonathan JINNY Dyer, D.O.  The 21st Century Cures Act was signed into law in 2016 which includes the topic of electronic health records.  This provides immediate access to information in MyChart.  This includes consultation notes, operative notes, office notes, lab results and pathology reports.  If you have any questions about what you read please let us  know at your next visit so we can discuss your concerns and take corrective action if need be.  We are right here with you.

## 2024-03-07 ENCOUNTER — Ambulatory Visit (INDEPENDENT_AMBULATORY_CARE_PROVIDER_SITE_OTHER): Payer: Self-pay | Admitting: Family Medicine

## 2024-03-07 DIAGNOSIS — L821 Other seborrheic keratosis: Secondary | ICD-10-CM | POA: Diagnosis not present

## 2024-03-07 DIAGNOSIS — L814 Other melanin hyperpigmentation: Secondary | ICD-10-CM | POA: Diagnosis not present

## 2024-03-07 DIAGNOSIS — Z85828 Personal history of other malignant neoplasm of skin: Secondary | ICD-10-CM | POA: Diagnosis not present

## 2024-03-07 DIAGNOSIS — L570 Actinic keratosis: Secondary | ICD-10-CM | POA: Diagnosis not present

## 2024-03-07 DIAGNOSIS — D1801 Hemangioma of skin and subcutaneous tissue: Secondary | ICD-10-CM | POA: Diagnosis not present

## 2024-03-07 LAB — LIPID PANEL
Chol/HDL Ratio: 4.9 ratio (ref 0.0–5.0)
Cholesterol, Total: 212 mg/dL — ABNORMAL HIGH (ref 100–199)
HDL: 43 mg/dL (ref >39)
LDL Chol Calc (NIH): 141 mg/dL — ABNORMAL HIGH (ref 0–99)
Triglycerides: 156 mg/dL — ABNORMAL HIGH (ref 0–149)
VLDL Cholesterol Cal: 28 mg/dL (ref 5–40)

## 2024-03-07 LAB — COMPREHENSIVE METABOLIC PANEL WITH GFR
ALT: 11 IU/L (ref 0–44)
AST: 16 IU/L (ref 0–40)
Albumin: 4.3 g/dL (ref 3.9–4.9)
Alkaline Phosphatase: 113 IU/L (ref 47–123)
BUN/Creatinine Ratio: 13 (ref 10–24)
BUN: 19 mg/dL (ref 8–27)
Bilirubin Total: 0.4 mg/dL (ref 0.0–1.2)
CO2: 22 mmol/L (ref 20–29)
Calcium: 9.4 mg/dL (ref 8.6–10.2)
Chloride: 105 mmol/L (ref 96–106)
Creatinine, Ser: 1.44 mg/dL — ABNORMAL HIGH (ref 0.76–1.27)
Globulin, Total: 3 g/dL (ref 1.5–4.5)
Glucose: 90 mg/dL (ref 70–99)
Potassium: 4.4 mmol/L (ref 3.5–5.2)
Sodium: 143 mmol/L (ref 134–144)
Total Protein: 7.3 g/dL (ref 6.0–8.5)
eGFR: 52 mL/min/1.73 — ABNORMAL LOW (ref >59)

## 2024-03-07 LAB — HEMOGLOBIN A1C
Est. average glucose Bld gHb Est-mCnc: 111 mg/dL
Hgb A1c MFr Bld: 5.5 % (ref 4.8–5.6)

## 2024-03-07 LAB — INSULIN, RANDOM: INSULIN: 10.7 u[IU]/mL (ref 2.6–24.9)

## 2024-03-07 LAB — VITAMIN D 25 HYDROXY (VIT D DEFICIENCY, FRACTURES): Vit D, 25-Hydroxy: 43.7 ng/mL (ref 30.0–100.0)

## 2024-03-08 NOTE — Telephone Encounter (Signed)
 Reports non-compliance with Repatha  injections. Reports he forgets to take Repatha  and averages taking it every 3 weeks. Encouraged to set an alarm for a reminder to take Repatha  injections every 2 weeks. Reports he will try this for better compliance.

## 2024-03-24 ENCOUNTER — Ambulatory Visit: Attending: Nurse Practitioner | Admitting: Nurse Practitioner

## 2024-03-24 ENCOUNTER — Encounter: Payer: Self-pay | Admitting: Nurse Practitioner

## 2024-03-24 VITALS — BP 126/70 | HR 56 | Ht 75.0 in | Wt 277.0 lb

## 2024-03-24 DIAGNOSIS — I10 Essential (primary) hypertension: Secondary | ICD-10-CM

## 2024-03-24 DIAGNOSIS — E782 Mixed hyperlipidemia: Secondary | ICD-10-CM

## 2024-03-24 DIAGNOSIS — R002 Palpitations: Secondary | ICD-10-CM | POA: Diagnosis not present

## 2024-03-24 DIAGNOSIS — I2510 Atherosclerotic heart disease of native coronary artery without angina pectoris: Secondary | ICD-10-CM

## 2024-03-24 DIAGNOSIS — I480 Paroxysmal atrial fibrillation: Secondary | ICD-10-CM

## 2024-03-24 DIAGNOSIS — I77819 Aortic ectasia, unspecified site: Secondary | ICD-10-CM

## 2024-03-24 DIAGNOSIS — I7121 Aneurysm of the ascending aorta, without rupture: Secondary | ICD-10-CM

## 2024-03-24 DIAGNOSIS — R931 Abnormal findings on diagnostic imaging of heart and coronary circulation: Secondary | ICD-10-CM

## 2024-03-24 MED ORDER — REPATHA SURECLICK 140 MG/ML ~~LOC~~ SOAJ: 140.0000 mg | SUBCUTANEOUS | 2 refills | Status: DC

## 2024-03-24 NOTE — Patient Instructions (Addendum)
 Medication Instructions:  Your physician recommends that you continue on your current medications as directed. Please refer to the Current Medication list given to you today.  Labwork: none  Testing/Procedures: Your physician has requested that you have an echocardiogram. Echocardiography is a painless test that uses sound waves to create images of your heart. It provides your doctor with information about the size and shape of your heart and how well your heart's chambers and valves are working. This procedure takes approximately one hour. There are no restrictions for this procedure. Please do NOT wear cologne, perfume, aftershave, or lotions (deodorant is allowed). Please arrive 15 minutes prior to your appointment time.  Please note: We ask at that you not bring children with you during ultrasound (echo/ vascular) testing. Due to room size and safety concerns, children are not allowed in the ultrasound rooms during exams. Our front office staff cannot provide observation of children in our lobby area while testing is being conducted. An adult accompanying a patient to their appointment will only be allowed in the ultrasound room at the discretion of the ultrasound technician under special circumstances. We apologize for any inconvenience.  Follow-Up: Your physician recommends that you schedule a follow-up appointment in: 1 year. You will receive a reminder call in about 8-10 months reminding you to schedule your appointment. If you don't receive this call, please contact our office.  Any Other Special Instructions Will Be Listed Below (If Applicable).  If you need a refill on your cardiac medications before your next appointment, please call your pharmacy.

## 2024-03-24 NOTE — Progress Notes (Signed)
 Cardiology Office Note:  .   Date: 03/24/2024 ID:  Jonathan Dyer, DOB 06-13-1955, MRN [id number] PCP: Antonetta Rollene BRAVO, MD  [facility] Providers Cardiologist:  Jayson Sierras, MD Electrophysiologist:  OLE ONEIDA HOLTS, MD {  History of Present Illness: .   Jonathan Dyer is a 68 y.o. male with a PMH of paroxysmal to persistent A-fib, mixed hyperlipidemia with history of statin myalgias, hypertension, ascending aortic aneurysm, who presents today for scheduled follow-up.  Last seen by Dr. Sierras on Sep 25, 2022.  He was very active at the time riding his bike.  Was overall doing well from a cardiac perspective.  He was planning on watchman implantation per Dr. Holts in September.  This has been scheduled for February 04, 2023.  I last saw him for 25-month follow-up on November 30, 2022.  Was doing well at that time.  He was pending future Watchman device implant.   He underwent LAAO with Watchman device implant on February 04, 2023. Noted some fatigue during office visit on 03/17/2023, felt it was related to Eliquis . Overall was doing well.   03/24/2024 - Here for follow-up. Doing well. Now that he is taking Flecainide  consistently, tells me it has helped his palpitations that are rare in occurrence. He underwent knee surgery in June of this year. Tells me he would like a refill of Repatha . Will be having an upcoming prostate biopsy with [facility]. Denies any recent chest pain, shortness of breath, palpitations, syncope, presyncope, dizziness, orthopnea, PND, swelling or significant weight changes, acute bleeding, or claudication.  SH: He was in a bicycle race in [place]  around the end of May/ early June of this year.   Studies Reviewed: SABRA    EKG: EKG Interpretation Date/Time:  Friday March 24 2024 09:19:35 EST Ventricular Rate:  54 PR Interval:  186 QRS Duration:  110 QT Interval:  430 QTC Calculation: 407 R Axis:   -22  Text Interpretation: Sinus bradycardia  Moderate voltage criteria for LVH, may be normal variant ( R in aVL , Cornell product ) When compared with ECG of 27-Aug-2023 15:49, No significant change was found Confirmed by Miriam Norris [phone]) on 03/24/2024 9:29:20 AM   Cardiac monitor 08/2023: Predominant rhythm is sinus with heart rate ranging from 40 bpm up to 102 bpm and average heart rate 55 bpm. There were rare PACs including atrial couplets and triplets representing less than 1% total beats. There were rare PVCs including ventricular couplets representing less than 1% total beats. Very limited junctional rhythm noted without bradycardia. Patient triggered episodes tended to correlate with PACs and PVCs.  There were no significant arrhythmias. No pauses or high degree heart block.   CT cardiac 04/2023: IMPRESSION: 1. Successful left atrial appendage occlusion. 2. Absence of peridevice leak. 3. Absence of device-related thrombus. 4. Stable device position without evidence of migration or embolization. 5. 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 Consider repeat imaging study in one year to follow for progression.  TEE 01/2023:  1. Prior to procedure, patient had a patent left atrial appendage. Distal  to the ostium, sufficient measurements for a 27 mm Watchman FLX Pro.   2. Atrial septal aneursym, lipomatous interatrial septal hypertrophy, and  patient foramen ovale noted. Transeptal puncture with no complications.   3. A 27 mm Watchman FLX Pro placed. No peri-device leak. No mitral  shoulder. No thrombus. Average compression ~ 19%.   4. No change to pericardial effusion. Expected iatrogenic atrial septal  defect with  all left to right shunting. Successful Watchman placement.   5. Left ventricular ejection fraction, by estimation, is 55 to 60%. The  left ventricle has normal function.   6. Right ventricular systolic function is normal. The right ventricular  size is normal.   7. Left atrial size was mild to  moderately dilated. No left atrial/left  atrial appendage thrombus was detected.   8. A small pericardial effusion is present. The pericardial effusion is  posterior to the left ventricle.   9. The mitral valve is abnormal. Mild mitral valve regurgitation. No  evidence of mitral stenosis.  10. The aortic valve is tricuspid. Aortic valve regurgitation is not  visualized. No aortic stenosis is present.  11. Aortic dilatation noted. Aneurysm of the ascending aorta, measuring 45  mm. There is mild dilatation of the aortic root, measuring 41 mm.  12. The inferior vena cava is normal in size with greater than 50%  respiratory variability, suggesting right atrial pressure of 3 mmHg.  13. Evidence of atrial level shunting detected by color flow Doppler.  Lexiscan  07/2022:    Findings are consistent with no ischemia. The study is low risk.   No ST deviation was noted. The ECG was negative for ischemia.  Low risk [facility] treadmill score of 9.   LV perfusion is equivocal.  Small, mild intensity, mid to apical inferolateral defect that is predominantly fixed with partial reversibility suggestive of variable soft tissue attenuation artifact.  Importantly, no large ischemic territories are noted.   Left ventricular function is normal. Nuclear stress EF: 53 %.   Low risk study with [facility] treadmill score of 9, no exercise-induced arrhythmias, and perfusion imaging most consistent with variable soft tissue attenuation.  No large ischemic territories are noted.  LVEF 53%.  CT cardiac 07/2022:  IMPRESSION: 1. Fusiform aneurysmal dilatation of the ascending aorta, maximal dimension 4.3 cm. This retrospectively appears stable to 2011 exam recommend annual imaging followup by CTA or MRA. This recommendation follows 2010 ACCF/AHA/AATS/ACR/ASA/SCA/SCAI/SIR/STS/SVM Guidelines for the Diagnosis and Management of Patients with Thoracic Aortic Disease. Circulation. 2010; 121: Z733-z630. Aortic aneurysm  NOS (ICD10-I71.9) 2. Peri fissural nodule most consistent with intrapulmonary lymph node on the right, needing no further follow-up.   Echo 04/2022:  1. Left ventricular ejection fraction, by estimation, is 55 to 60%. The  left ventricle has normal function. The left ventricle has no regional  wall motion abnormalities. Left ventricular diastolic parameters are  indeterminate. The average left  ventricular global longitudinal strain is -22.7 %. The global longitudinal  strain is normal.   2. Right ventricular systolic function not well visualized but appears to  be mildly reduced. The right ventricular size is mildly enlarged.   3. The mitral valve is grossly normal. Mild mitral valve regurgitation.  No evidence of mitral stenosis.   4. The aortic valve is tricuspid. There is mild calcification of the  aortic valve. Aortic valve regurgitation is not visualized. No aortic  stenosis is present.   5. Aortic dilatation noted. There is mild dilatation of the aortic root,  measuring 44 mm. There is mild dilatation of the ascending aorta,  measuring 44 mm. There is mild dilatation of the aortic arch, measuring 40 mm.   Risk Assessment/Calculations:       The 10-year ASCVD risk score (Arnett DK, et al., 2019) is: 22.3%   Values used to calculate the score:     Age: 80 years     Clincally relevant sex: Male     Is  Non-Hispanic African American: No     Diabetic: No     Tobacco smoker: No     Systolic Blood Pressure: 126 mmHg     Is BP treated: Yes     HDL Cholesterol: 43 mg/dL     Total Cholesterol: 212 mg/dL      Physical Exam:   VS:  BP 126/70 (BP Location: Left Arm, Patient Position: Sitting, Cuff Size: Large)   Pulse (!) 56   Ht 6' 3 (1.905 m)   Wt 277 lb (125.6 kg)   SpO2 97%   BMI 34.62 kg/m    Wt Readings from Last 3 Encounters:  03/24/24 277 lb (125.6 kg)  03/06/24 269 lb (122 kg)  01/25/24 271 lb (122.9 kg)    GEN: Well nourished, well developed in no acute  distress NECK: No JVD; No carotid bruits CARDIAC: S1/S2, RRR, no murmurs, rubs, gallops RESPIRATORY:  Clear to auscultation without rales, wheezing or rhonchi  ABDOMEN: Soft, non-tender, non-distended EXTREMITIES:  No edema; No deformity   ASSESSMENT AND PLAN: .    Paroxysmal A-fib, palpitations Denies any recent palpitations or tachycardia. Most recent monitor benign.  Discussed to wean off caffeine use.  He verbalized understanding. Continue current medication regimen. Not on OAC.   Coronary artery calcification Coronary CTA in March 2024 revealed coronary artery calcium  score 5,250. Stable with no anginal symptoms. No indication for ischemic evaluation. Continue current medication regimen.   Mixed HLD LDL 141 02/2024, has been inconsistent with Repatha , ran out and needs a refill. Will provide refill. Heart healthy diet and regular cardiovascular exercise encouraged.   HTN BP stable. Discussed to monitor BP at home at least 2 hours after medications and sitting for 5-10 minutes. Continue current medication regimen. Heart healthy diet and regular cardiovascular exercise encouraged.   Ascending aortic aneurysm, aortic dilatation CT imaging 04/2023 revealed mild aortic dilatation, 44 mm.  Was recommended to repeat imaging study in 1 year. Will update Echo at this time as he has hx of  CKD, want to avoid contrast dye. Care and ED precautions discussed.  Care and ED precautions discussed.     Dispo: Follow-up in 1 year with MD or APP or sooner if anything changes.  Medication Adjustments/Labs and Tests Ordered: Current medicines are reviewed at length with the patient today.  Concerns regarding medicines are outlined above.   Tests Ordered: Orders Placed This Encounter  Procedures   EKG 12-Lead   ECHOCARDIOGRAM COMPLETE    Medication Changes: Meds ordered this encounter  Medications   Evolocumab  (REPATHA  SURECLICK) 140 MG/ML SOAJ    Sig: Inject 140 mg into the skin every 14  (fourteen) days.    Dispense:  6 mL    Refill:  2    Signed, Almarie Crate, NP

## 2024-03-27 ENCOUNTER — Telehealth: Payer: Self-pay | Admitting: *Deleted

## 2024-03-27 NOTE — Progress Notes (Signed)
   03/27/2024  [name], male   DOB: 08/07/1955, 68 y.o.   MRN: [id number]  Pharmacy Quality Measure Review  This patient is appearing on the insurance-providing list for being at risk of failing the adherence measure for Statin Therapy for Patients with Cardiovascular Disease [facility]) medications this calendar year.   Patient repatha , no documented exclusion code for [facility] noted. Previously documented ICD G72.0, made note to review week prior to upcoming PCP visit.   Future Appointments  Date Time Provider Department Center  04/17/2024 10:00 AM Midge Sober, DO [facility] None  04/24/2024  8:30 AM [facility] US  1 CV-[facility] None  04/25/2024  3:30 PM Shirlean Therisa ORN, NP [facility]  05/05/2024  9:40 AM Bevely Doffing, FNP [facility] [phone] [address]      Lang Sieve, PharmD, BCGP Clinical Pharmacist  [phone]

## 2024-03-27 NOTE — Telephone Encounter (Signed)
 Aneurysm reference sheet sent to paint via mychart.

## 2024-03-28 ENCOUNTER — Ambulatory Visit: Admitting: Gastroenterology

## 2024-04-10 ENCOUNTER — Other Ambulatory Visit: Payer: Self-pay | Admitting: Cardiology

## 2024-04-10 DIAGNOSIS — I2510 Atherosclerotic heart disease of native coronary artery without angina pectoris: Secondary | ICD-10-CM

## 2024-04-10 DIAGNOSIS — E782 Mixed hyperlipidemia: Secondary | ICD-10-CM

## 2024-04-17 ENCOUNTER — Ambulatory Visit (INDEPENDENT_AMBULATORY_CARE_PROVIDER_SITE_OTHER): Admitting: Family Medicine

## 2024-04-24 ENCOUNTER — Ambulatory Visit: Attending: Nurse Practitioner

## 2024-04-24 DIAGNOSIS — I7121 Aneurysm of the ascending aorta, without rupture: Secondary | ICD-10-CM

## 2024-04-24 DIAGNOSIS — I77819 Aortic ectasia, unspecified site: Secondary | ICD-10-CM

## 2024-04-25 ENCOUNTER — Ambulatory Visit: Admitting: Gastroenterology

## 2024-04-25 VITALS — BP 137/74 | HR 55 | Temp 98.3°F | Ht 75.0 in | Wt 290.4 lb

## 2024-04-25 DIAGNOSIS — K862 Cyst of pancreas: Secondary | ICD-10-CM | POA: Diagnosis not present

## 2024-04-25 DIAGNOSIS — Z860101 Personal history of adenomatous and serrated colon polyps: Secondary | ICD-10-CM

## 2024-04-25 DIAGNOSIS — K219 Gastro-esophageal reflux disease without esophagitis: Secondary | ICD-10-CM

## 2024-04-25 DIAGNOSIS — Z8546 Personal history of malignant neoplasm of prostate: Secondary | ICD-10-CM | POA: Diagnosis not present

## 2024-04-25 NOTE — Progress Notes (Signed)
 "   Gastroenterology Office Note     Primary Care Physician:  No primary care provider on file.  Primary Gastroenterologist: Dr Cindie    Chief Complaint   Chief Complaint  Patient presents with   Follow-up    Follow up MRI     History of Present Illness   Jonathan Dyer is a 68 y.o. male presenting today with a history of afib, intermediate-risk prostate cancer diagnosed in Jan 2024,  chronic GERD, history of adenomas with last colonoscopy in 2019 (ischemic colitis at that time), and surveillance due in 2024, and pancreatic cysts. Most recent MR abdomen Oct 2022 with stable 9 mm cystic lesion and additional smaller cystic lesions in pancreatic tail that are stable. Plan for annual MRI monitor of pancreatic cystic lesions for 5 years, then every 2 years for X 2 per previous recommendations-given age at onset less than 76. Oct 2023 will be 5 years, whereby next is due Oct 2025 and then again in Oct 2027.    Only takes omeprazole  as needed. Chronic epigastric pain unchanged. Holding off on EGD per his request. No dysphagia. He has a lot going on with history of prostate cancer and surveillance.   Hx of prostate cancer. PET scan on New years Eve.  Radiology Jan 5th at [facility].   No N/V. No diarrhea no overt GI bleeding. Good appetite.  He would like to postpone MRI and colonoscopy for now as needs to get through upcoming evaluations.     Colonoscopy 2019: normal TI, ischemic colitis. Colonoscopy due in 2024. He is wanting to hold off on surveillance right now due to upcoming knee I&D. He is declining EGD.   No FH colon cancer.    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 2 year surveillance recommended.    Past Medical History:  Diagnosis Date   Allergy    Arthritis    Atrial fibrillation (HCC)    Diagnosed 09/2009, on flecainide    Back pain    Bilateral swelling of feet    Bradycardia    Event monitor 2010: HR down to 45,  asymptomatic.   Cataract    Coronary atherosclerosis of native coronary artery    Nonobstructive and distal disease by cath 2005.    Difficult intubation    Difficult airway noted pt. unaware   Dysrhythmia    a-fib   Essential hypertension    GERD (gastroesophageal reflux disease)    occasional   Headache    migraine affects vision   Hyperlipidemia    Joint pain    Neuromuscular disorder (HCC)    Sciatica   OSA (obstructive sleep apnea)    Osteoarthritis    Overweight    Pre-diabetes    Prediabetes    Presence of Watchman left atrial appendage closure device 02/04/2023   Watchman 27mm FLX Pro placed by Dr. Cindie   Prostate cancer [facility]) 05/2022   following via biopsy   Renal cyst    Ringing in ears    Sleep apnea    borderline no cpap   Vitamin D  deficiency     Past Surgical History:  Procedure Laterality Date   BIOPSY  01/04/2018   Procedure: BIOPSY;  Surgeon: Harvey Margo CROME, MD;  Location: [facility];  Service: Endoscopy;;  ascending colon    CARDIAC CATHETERIZATION  2005   COLONOSCOPY  05/2006   SLF: 3 mm sigmoid: Tubular adenoma removed.   COLONOSCOPY N/A 05/06/2016  Dr. Harvey: two sessile polyps in mid transverse colon and hepatic flexure (tubular adenomas). ext/int hemorrhoids. tortuous left colon.    COLONOSCOPY WITH PROPOFOL  N/A 01/04/2018   Procedure: COLONOSCOPY WITH PROPOFOL ;  Surgeon: Harvey Margo CROME, MD;  Location: [facility];  Service: Endoscopy;  Laterality: N/A;  2:30pm   I & D KNEE WITH POLY EXCHANGE Left 10/27/2023   Procedure: IRRIGATION AND DEBRIDEMENT KNEE WITH POLY EXCHANGE;  Surgeon: Edna Toribio LABOR, MD;  Location: [facility];  Service: Orthopedics;  Laterality: Left;   KNEE ARTHROSCOPY     Left anterior cruciate ligament reconstruction     LEFT ATRIAL APPENDAGE OCCLUSION N/A 02/04/2023   Procedure: LEFT ATRIAL APPENDAGE OCCLUSION;  Surgeon: Cindie Ole DASEN, MD;  Location: [facility];  Service: Cardiovascular;  Laterality:  N/A;   left shoulder surgery     dr . lamar collins   rotator cuff   POLYPECTOMY  05/06/2016   Procedure: POLYPECTOMY;  Surgeon: Margo CROME Harvey, MD;  Location: [facility];  Service: Endoscopy;;  transverse colon polyp, hepatic flexure polyp,    Right shoulder surgery     rotator cuff   ROBOT ASSISTED LAPAROSCOPIC NEPHRECTOMY Left 07/04/2018   Procedure: XI ROBOTIC ASSISTED LAPAROSCOPIC RENAL CYST DECORTICATION;  Surgeon: Sherrilee Belvie CROME, MD;  Location: [facility];  Service: Urology;  Laterality: Left;  3 HRSPROCEDURE: ROBOTIC RENAL CYST DECORTICATION   TEE WITHOUT CARDIOVERSION N/A 02/04/2023   Procedure: TRANSESOPHAGEAL ECHOCARDIOGRAM;  Surgeon: Cindie Ole DASEN, MD;  Location: [facility] INVASIVE CV LAB;  Service: Cardiovascular;  Laterality: N/A;   TONSILLECTOMY     TOTAL KNEE ARTHROPLASTY Left     Current Outpatient Medications  Medication Sig Dispense Refill   amLODipine  (NORVASC ) 10 MG tablet TAKE 1 TABLET BY MOUTH DAILY 100 tablet 2   aspirin  EC 81 MG tablet Take 81 mg by mouth daily. Swallow whole.     cephALEXin  (KEFLEX ) 500 MG capsule Take 1 capsule (500 mg total) by mouth as needed. Take one tablet by mouth, one hour prior to dental cleanings 3 capsule 0   Coenzyme Q10 (CO Q-10) 100 MG CAPS Take 1 tablet by mouth daily.     flecainide  (TAMBOCOR ) 50 MG tablet TAKE 1 TABLET BY MOUTH TWICE  DAILY 200 tablet 3   losartan  (COZAAR ) 100 MG tablet TAKE 1 TABLET BY MOUTH DAILY 100 tablet 2   meloxicam  (MOBIC ) 7.5 MG tablet Take 1-2 tablets (7.5-15 mg total) by mouth daily as needed for pain. 30 tablet 0   Multiple Vitamin (MULTIVITAMIN) tablet Take 1 tablet by mouth daily.     omeprazole  (PRILOSEC) 20 MG capsule TAKE 1 CAPSULE BY MOUTH DAILY AS NEEDED FOR ACID REFLUX,  INDIGESTION 90 capsule 1   REPATHA  SURECLICK 140 MG/ML SOAJ ADMINISTER 1 ML UNDER THE SKIN EVERY 14 DAYS 6 mL 2   tamsulosin  (FLOMAX ) 0.4 MG CAPS capsule Take 0.4 mg by mouth 2 (two) times a week. Takes occasionally.      Vitamin D , Ergocalciferol , (DRISDOL ) 1.25 MG (50000 UNIT) CAPS capsule Take 1 capsule (50,000 Units total) by mouth every 7 (seven) days. 13 capsule 0   polyethylene glycol (MIRALAX ) 17 g packet Take 17 g by mouth daily. (Patient not taking: Reported on 04/25/2024) 14 each 0   No current facility-administered medications for this visit.    Allergies as of 04/25/2024 - Review Complete 04/25/2024  Allergen Reaction Noted   Clindamycin/lincomycin Swelling and Other (See Comments) 01/03/2018   Penicillin g Other (See Comments) 04/06/2022  Penicillins Other (See Comments)    Pneumococcal 13-val conj vacc Swelling 08/15/2020    Family History  Problem Relation Age of Onset   Diabetes Mother    Pulmonary embolism Mother    Obesity Mother    Cancer Father    Hypertension Father    Lung disease Father    Heart disease Father    Arthritis Sister    Hyperlipidemia Brother    Hypertension Brother    Diabetes Other        Significant family h/o DM   Colon cancer Neg Hx     Social History   Socioeconomic History   Marital status: Divorced    Spouse name: Not on file   Number of children: 3   Years of education: Not on file   Highest education level: Not on file  Occupational History   Occupation: PT-custodial work   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   Social Drivers of Health   Tobacco Use: Low Risk  (04/10/2024)   Received from [facility]   Patient History    Smoking Tobacco Use: Never    Smokeless Tobacco Use: Never     Passive Exposure: Not on file  Financial Resource Strain: Low Risk (02/16/2023)   Overall Financial Resource Strain (CARDIA)    Difficulty of Paying Living Expenses: Not hard at all  Food Insecurity: No Food Insecurity (10/27/2023)   Epic    Worried About Programme Researcher, Broadcasting/film/video in the Last Year: Never true    Ran Out of Food in the Last Year: Never true  Transportation Needs: No Transportation Needs (10/27/2023)   Epic    Lack of Transportation (Medical): No    Lack of Transportation (Non-Medical): No  Physical Activity: Sufficiently Active (02/16/2023)   Exercise Vital Sign    Days of Exercise per Week: 4 days    Minutes of Exercise per Session: 60 min  Stress: No Stress Concern Present (02/16/2023)   [employer] of Occupational Health - Occupational Stress Questionnaire    Feeling of Stress : Not at all  Social Connections: Moderately Integrated (10/27/2023)   Social Connection and Isolation Panel    Frequency of Communication with Friends and Family: More than three times a week    Frequency of Social Gatherings with Friends and Family: More than three times a week    Attends Religious Services: More than 4 times per year    Active Member of Clubs or Organizations: Yes    Attends Banker Meetings: More than 4 times per year    Marital Status: Divorced  Intimate Partner Violence: Not At Risk (10/27/2023)   Epic    Fear of Current or Ex-Partner: No    Emotionally Abused: No    Physically Abused: No    Sexually Abused: No  Depression (PHQ2-9): Low Risk (07/27/2023)   Depression (PHQ2-9)    PHQ-2 Score: 0  Alcohol  Screen: Low Risk (02/16/2023)   Alcohol  Screen    Last Alcohol  Screening Score (AUDIT): 0  Housing:  Unknown (02/16/2024)   Received from [facility]   Epic    Unable to Pay for Housing in the Last Year: Not on file    Number of Times Moved in the Last Year: Not on file    At any time in the past 12 months, were you homeless or living in a  shelter (including now)?: No  Utilities: Not At Risk (10/27/2023)   Epic    Threatened with loss of utilities: No  Health Literacy: Adequate Health Literacy (02/16/2023)   B1300 Health Literacy    Frequency of need for help with medical instructions: Never     Review of Systems   Gen: Denies any fever, chills, fatigue, weight loss, lack of appetite.  CV: Denies chest pain, heart palpitations, peripheral edema, syncope.  Resp: Denies shortness of breath at rest or with exertion. Denies wheezing or cough.  GI: Denies dysphagia or odynophagia. Denies jaundice, hematemesis, fecal incontinence. GU : Denies urinary burning, urinary frequency, urinary hesitancy MS: Denies joint pain, muscle weakness, cramps, or limitation of movement.  Derm: Denies rash, itching, dry skin Psych: Denies depression, anxiety, memory loss, and confusion Heme: Denies bruising, bleeding, and enlarged lymph nodes.   Physical Exam   Temp 98.3 F (36.8 C)   Ht 6' 3 (1.905 m)   Wt 290 lb 6.4 oz (131.7 kg)   BMI 36.30 kg/m  General:   Alert and oriented. Pleasant and cooperative. Well-nourished and well-developed.  Head:  Normocephalic and atraumatic. Eyes:  Without icterus Abdomen:  +BS, soft, non-tender and non-distended. No HSM noted. No guarding or rebound. No masses appreciated.  Rectal:  Deferred  Msk:  Symmetrical without gross deformities. Normal posture. Extremities:  Without edema. Neurologic:  Alert and  oriented x4;  grossly normal neurologically. Skin:  Intact without significant lesions or rashes. Psych:  Alert and cooperative. Normal mood and affect.   Assessment   Chronic GERD  Pancreatic cystic lesions with surveillance recommended  History of adenomas with need for surveillance        PLAN   Doing well on PPI prn and will continue this ongoing prn. Declining EGD for Barrett's screening.   He has multiple visits upcoming due to history of prostate cancer and desires to hold  off surveillance colonoscopy and MRI surveillance at the moment.  We will place him on recall for MRI in the spring at his request.  We can plan on colonoscopy thereafter when he is able to do so. He is to call if any other concerns in the interim.    Therisa MICAEL Stager, PhD, ANP-BC [facility]    "

## 2024-04-25 NOTE — Patient Instructions (Signed)
 We will send a letter in the spring to arrange the MRI of the pancreas.  We can plan on a colonoscopy later next year!  Please call with any concerns!   Have a wonderful Christmas!  I enjoyed seeing you again today! I value our relationship and want to provide genuine, compassionate, and quality care. You may receive a survey regarding your visit with me, and I welcome your feedback! Thanks so much for taking the time to complete this. I look forward to seeing you again.      Therisa MICAEL Stager, PhD, ANP-BC [facility]

## 2024-04-26 LAB — ECHOCARDIOGRAM COMPLETE
AR max vel: 6.06 cm2
AV Peak grad: 8.4 mmHg
Ao pk vel: 1.45 m/s
Area-P 1/2: 3.65 cm2
Calc EF: 54.2 %
S' Lateral: 3.3 cm
Single Plane A2C EF: 43.9 %
Single Plane A4C EF: 64.4 %

## 2024-04-26 NOTE — Progress Notes (Signed)
° °  04/26/2024 Name: Jonathan Dyer MRN: [id number] DOB: 04-Nov-1955  Chief Complaint  Patient presents with   pharmacy quality review   Future Appointments  Date Time Provider Department Center  06/26/2024  9:00 AM Midge Sober, DO [facility] None  07/14/2024  8:40 AM Bevely Doffing, FNP [facility] [address]    Visit RS so no opportunity to include previously documented ICD G72.0 as statin exclusion code. Reminder set for march.   Lang Sieve, PharmD, BCGP Clinical Pharmacist  [phone]

## 2024-04-28 ENCOUNTER — Ambulatory Visit: Payer: Self-pay | Admitting: Nurse Practitioner

## 2024-04-28 ENCOUNTER — Telehealth: Payer: Self-pay | Admitting: Cardiology

## 2024-04-28 NOTE — Telephone Encounter (Signed)
 Pt is wanting someone to call in regards to his scan from Monday!  Wants a call back today   Best number [phone]

## 2024-04-28 NOTE — Telephone Encounter (Signed)
 Echo done 04/24/24, requests results

## 2024-05-02 NOTE — Telephone Encounter (Signed)
 Patient notified of result.  Please refer to phone note from today for complete details.   Littie CHRISTELLA Croak, CMA 05/02/2024 7:51 AM

## 2024-05-05 ENCOUNTER — Ambulatory Visit

## 2024-05-10 NOTE — Progress Notes (Signed)
" ° °  05/10/2024  [name], male   DOB: 11/21/1955, 68 y.o.   MRN: [id number]  Pharmacy Quality Measure Review  Noted that patient transferred care to [facility] primary care, no action at this time.   Lang Sieve, PharmD, BCGP Clinical Pharmacist  [phone]  "

## 2024-06-26 ENCOUNTER — Ambulatory Visit (INDEPENDENT_AMBULATORY_CARE_PROVIDER_SITE_OTHER): Admitting: Family Medicine

## 2024-07-14 ENCOUNTER — Ambulatory Visit

## 2024-08-23 ENCOUNTER — Ambulatory Visit (INDEPENDENT_AMBULATORY_CARE_PROVIDER_SITE_OTHER): Admitting: Family Medicine
